# Patient Record
Sex: Male | Born: 1950 | Race: Black or African American | Hispanic: No | Marital: Married | Smoking: Former smoker
Health system: Southern US, Community
[De-identification: ages and names within clinical notes are randomized; demographics above are authoritative.]

## PROBLEM LIST (undated history)

## (undated) DIAGNOSIS — N419 Inflammatory disease of prostate, unspecified: Secondary | ICD-10-CM

## (undated) DIAGNOSIS — I10 Essential (primary) hypertension: Secondary | ICD-10-CM

## (undated) DIAGNOSIS — I639 Cerebral infarction, unspecified: Secondary | ICD-10-CM

## (undated) HISTORY — DX: Inflammatory disease of prostate, unspecified: N41.9

## (undated) HISTORY — DX: Essential (primary) hypertension: I10

## (undated) HISTORY — PX: OTHER SURGICAL HISTORY: SHX169

## (undated) HISTORY — PX: COLONOSCOPY: SHX174

## (undated) HISTORY — DX: Cerebral infarction, unspecified: I63.9

---

## 1998-05-20 ENCOUNTER — Emergency Department (HOSPITAL_COMMUNITY): Admission: EM | Admit: 1998-05-20 | Discharge: 1998-05-20 | Payer: Self-pay | Admitting: Emergency Medicine

## 2004-02-20 ENCOUNTER — Ambulatory Visit: Payer: Self-pay | Admitting: Internal Medicine

## 2004-02-27 ENCOUNTER — Ambulatory Visit: Payer: Self-pay | Admitting: Internal Medicine

## 2004-05-27 ENCOUNTER — Ambulatory Visit: Payer: Self-pay | Admitting: Internal Medicine

## 2004-05-28 ENCOUNTER — Ambulatory Visit: Payer: Self-pay | Admitting: Internal Medicine

## 2004-08-28 ENCOUNTER — Ambulatory Visit: Payer: Self-pay | Admitting: Internal Medicine

## 2004-08-29 ENCOUNTER — Ambulatory Visit: Payer: Self-pay | Admitting: Internal Medicine

## 2004-12-25 ENCOUNTER — Ambulatory Visit: Payer: Self-pay | Admitting: Internal Medicine

## 2005-01-01 ENCOUNTER — Ambulatory Visit: Payer: Self-pay | Admitting: Internal Medicine

## 2005-04-07 ENCOUNTER — Ambulatory Visit: Payer: Self-pay | Admitting: Internal Medicine

## 2005-08-01 ENCOUNTER — Ambulatory Visit: Payer: Self-pay | Admitting: Internal Medicine

## 2006-04-16 ENCOUNTER — Ambulatory Visit: Payer: Self-pay | Admitting: Internal Medicine

## 2006-04-16 LAB — CONVERTED CEMR LAB
AST: 21 units/L (ref 0–37)
Basophils Relative: 0.4 % (ref 0.0–1.0)
Bilirubin Urine: NEGATIVE
Calcium: 8.9 mg/dL (ref 8.4–10.5)
Chloride: 103 meq/L (ref 96–112)
Creatinine, Ser: 1 mg/dL (ref 0.4–1.5)
Eosinophils Relative: 3 % (ref 0.0–5.0)
GFR calc Af Amer: 99 mL/min
Glucose, Bld: 132 mg/dL — ABNORMAL HIGH (ref 70–99)
HCT: 42.7 % (ref 39.0–52.0)
LDL Cholesterol: 24 mg/dL (ref 0–99)
Lymphocytes Relative: 40.3 % (ref 12.0–46.0)
MCHC: 35.2 g/dL (ref 30.0–36.0)
MCV: 91.8 fL (ref 78.0–100.0)
Monocytes Absolute: 0.5 10*3/uL (ref 0.2–0.7)
Neutro Abs: 3.5 10*3/uL (ref 1.4–7.7)
Neutrophils Relative %: 49.7 % (ref 43.0–77.0)
Nitrite: NEGATIVE
RBC: 4.65 M/uL (ref 4.22–5.81)
Sodium: 138 meq/L (ref 135–145)
Specific Gravity, Urine: 1.02 (ref 1.000–1.03)
TSH: 2.01 microintl units/mL (ref 0.35–5.50)
Total CHOL/HDL Ratio: 2.6
Total Protein, Urine: NEGATIVE mg/dL
Triglycerides: 144 mg/dL (ref 0–149)
Urine Glucose: NEGATIVE mg/dL
Urobilinogen, UA: 0.2 (ref 0.0–1.0)
VLDL: 29 mg/dL (ref 0–40)
pH: 6 (ref 5.0–8.0)

## 2006-04-23 ENCOUNTER — Ambulatory Visit: Payer: Self-pay | Admitting: Internal Medicine

## 2006-10-21 ENCOUNTER — Ambulatory Visit: Payer: Self-pay | Admitting: Internal Medicine

## 2006-10-21 DIAGNOSIS — E1159 Type 2 diabetes mellitus with other circulatory complications: Secondary | ICD-10-CM

## 2006-10-22 ENCOUNTER — Ambulatory Visit: Payer: Self-pay | Admitting: Internal Medicine

## 2006-10-22 LAB — CONVERTED CEMR LAB
AST: 27 units/L (ref 0–37)
Albumin: 4 g/dL (ref 3.5–5.2)
Bilirubin, Direct: 0.2 mg/dL (ref 0.0–0.3)
Cholesterol: 95 mg/dL (ref 0–200)
GFR calc Af Amer: 99 mL/min
GFR calc non Af Amer: 82 mL/min
Glucose, Bld: 146 mg/dL — ABNORMAL HIGH (ref 70–99)
HDL: 29.5 mg/dL — ABNORMAL LOW (ref >39.0)
Hgb A1c MFr Bld: 6.6 % — ABNORMAL HIGH (ref 4.6–6.0)
LDL Cholesterol: 35 mg/dL (ref 0–99)
Potassium: 4 meq/L (ref 3.5–5.1)
Sodium: 139 meq/L (ref 135–145)
Total CHOL/HDL Ratio: 3.2
Triglycerides: 155 mg/dL — ABNORMAL HIGH (ref 0–149)
VLDL: 31 mg/dL (ref 0–40)

## 2007-04-28 ENCOUNTER — Encounter: Payer: Self-pay | Admitting: Internal Medicine

## 2007-05-11 ENCOUNTER — Encounter: Payer: Self-pay | Admitting: Internal Medicine

## 2007-05-18 ENCOUNTER — Ambulatory Visit: Payer: Self-pay | Admitting: Internal Medicine

## 2007-05-19 LAB — CONVERTED CEMR LAB
ALT: 24 units/L (ref 0–53)
Basophils Relative: 0.6 % (ref 0.0–1.0)
Bilirubin Urine: NEGATIVE
Bilirubin, Direct: 0.2 mg/dL (ref 0.0–0.3)
CO2: 28 meq/L (ref 19–32)
Cholesterol: 90 mg/dL (ref 0–200)
Crystals: NEGATIVE
Eosinophils Absolute: 0.3 10*3/uL (ref 0.0–0.6)
Eosinophils Relative: 4.1 % (ref 0.0–5.0)
GFR calc Af Amer: 99 mL/min
Glucose, Bld: 134 mg/dL — ABNORMAL HIGH (ref 70–99)
Hemoglobin: 14.9 g/dL (ref 13.0–17.0)
Leukocytes, UA: NEGATIVE
Lymphocytes Relative: 36.6 % (ref 12.0–46.0)
MCV: 91.3 fL (ref 78.0–100.0)
Monocytes Absolute: 0.4 10*3/uL (ref 0.2–0.7)
Monocytes Relative: 5.7 % (ref 3.0–11.0)
Neutro Abs: 4.2 10*3/uL (ref 1.4–7.7)
PSA: 0.28 ng/mL (ref 0.10–4.00)
Potassium: 3.8 meq/L (ref 3.5–5.1)
Sodium: 139 meq/L (ref 135–145)
Specific Gravity, Urine: 1.015 (ref 1.000–1.03)
TSH: 1.86 microintl units/mL (ref 0.35–5.50)
Total CHOL/HDL Ratio: 3.2
Total Protein, Urine: NEGATIVE mg/dL
Total Protein: 7.2 g/dL (ref 6.0–8.3)
Triglycerides: 152 mg/dL — ABNORMAL HIGH (ref 0–149)
Urine Glucose: NEGATIVE mg/dL
WBC: 7.7 10*3/uL (ref 4.5–10.5)

## 2007-06-22 ENCOUNTER — Ambulatory Visit: Payer: Self-pay | Admitting: Internal Medicine

## 2007-07-07 ENCOUNTER — Telehealth: Payer: Self-pay | Admitting: Internal Medicine

## 2007-07-07 ENCOUNTER — Encounter: Payer: Self-pay | Admitting: Internal Medicine

## 2008-02-02 ENCOUNTER — Ambulatory Visit: Payer: Self-pay | Admitting: Internal Medicine

## 2008-02-03 LAB — CONVERTED CEMR LAB
AST: 20 units/L (ref 0–37)
Albumin: 3.9 g/dL (ref 3.5–5.2)
Alkaline Phosphatase: 67 units/L (ref 39–117)
BUN: 10 mg/dL (ref 6–23)
CO2: 28 meq/L (ref 19–32)
Chloride: 106 meq/L (ref 96–112)
Cholesterol: 82 mg/dL (ref 0–200)
GFR calc non Af Amer: 106 mL/min
LDL Cholesterol: 27 mg/dL (ref 0–99)
Potassium: 3.8 meq/L (ref 3.5–5.1)

## 2008-02-09 ENCOUNTER — Ambulatory Visit: Payer: Self-pay | Admitting: Internal Medicine

## 2008-10-26 ENCOUNTER — Ambulatory Visit: Payer: Self-pay | Admitting: Internal Medicine

## 2008-10-30 LAB — CONVERTED CEMR LAB
AST: 22 units/L (ref 0–37)
Albumin: 4 g/dL (ref 3.5–5.2)
Alkaline Phosphatase: 54 units/L (ref 39–117)
BUN: 9 mg/dL (ref 6–23)
Basophils Relative: 0.2 % (ref 0.0–3.0)
Bilirubin Urine: NEGATIVE
CO2: 24 meq/L (ref 19–32)
Chloride: 105 meq/L (ref 96–112)
Cholesterol: 93 mg/dL (ref 0–200)
Creatinine, Ser: 0.9 mg/dL (ref 0.4–1.5)
Hemoglobin: 14.2 g/dL (ref 13.0–17.0)
Ketones, ur: NEGATIVE mg/dL
LDL Cholesterol: 32 mg/dL (ref 0–99)
Lymphocytes Relative: 39.5 % (ref 12.0–46.0)
Monocytes Relative: 6.5 % (ref 3.0–12.0)
Neutro Abs: 3.6 10*3/uL (ref 1.4–7.7)
Nitrite: NEGATIVE
RBC: 4.33 M/uL (ref 4.22–5.81)
Total CHOL/HDL Ratio: 3
Total Protein, Urine: NEGATIVE mg/dL
Total Protein: 6.8 g/dL (ref 6.0–8.3)
pH: 6 (ref 5.0–8.0)

## 2008-11-03 ENCOUNTER — Ambulatory Visit: Payer: Self-pay | Admitting: Internal Medicine

## 2009-03-01 ENCOUNTER — Ambulatory Visit: Payer: Self-pay | Admitting: Internal Medicine

## 2009-03-01 LAB — CONVERTED CEMR LAB
Bilirubin Urine: NEGATIVE
Chloride: 108 meq/L (ref 96–112)
Creatinine, Ser: 1.1 mg/dL (ref 0.4–1.5)
GFR calc non Af Amer: 88.13 mL/min (ref >60)
Hgb A1c MFr Bld: 7.4 % — ABNORMAL HIGH (ref 4.6–6.5)
Leukocytes, UA: NEGATIVE
Nitrite: NEGATIVE
Urobilinogen, UA: 0.2 (ref 0.0–1.0)
pH: 5.5 (ref 5.0–8.0)

## 2009-03-24 DIAGNOSIS — N419 Inflammatory disease of prostate, unspecified: Secondary | ICD-10-CM

## 2009-03-24 HISTORY — DX: Inflammatory disease of prostate, unspecified: N41.9

## 2009-10-26 ENCOUNTER — Ambulatory Visit: Payer: Self-pay | Admitting: Internal Medicine

## 2009-10-29 ENCOUNTER — Telehealth: Payer: Self-pay | Admitting: Internal Medicine

## 2009-10-29 LAB — CONVERTED CEMR LAB
AST: 25 units/L (ref 0–37)
Albumin: 4.2 g/dL (ref 3.5–5.2)
Alkaline Phosphatase: 57 units/L (ref 39–117)
BUN: 17 mg/dL (ref 6–23)
Basophils Relative: 0.6 % (ref 0.0–3.0)
Bilirubin, Direct: 0.1 mg/dL (ref 0.0–0.3)
CO2: 26 meq/L (ref 19–32)
Chloride: 105 meq/L (ref 96–112)
Direct LDL: 26.2 mg/dL
Glucose, Bld: 145 mg/dL — ABNORMAL HIGH (ref 70–99)
HDL: 27.1 mg/dL — ABNORMAL LOW (ref >39.00)
Hemoglobin: 14.8 g/dL (ref 13.0–17.0)
Lymphocytes Relative: 43.6 % (ref 12.0–46.0)
Monocytes Relative: 5.8 % (ref 3.0–12.0)
Neutro Abs: 4 10*3/uL (ref 1.4–7.7)
Neutrophils Relative %: 47.4 % (ref 43.0–77.0)
Nitrite: NEGATIVE
Potassium: 4 meq/L (ref 3.5–5.1)
RBC: 4.52 M/uL (ref 4.22–5.81)
Total CHOL/HDL Ratio: 4
Total Protein, Urine: NEGATIVE mg/dL
Total Protein: 6.9 g/dL (ref 6.0–8.3)
Urine Glucose: NEGATIVE mg/dL
VLDL: 109.6 mg/dL — ABNORMAL HIGH (ref 0.0–40.0)
WBC: 8.4 10*3/uL (ref 4.5–10.5)
pH: 6 (ref 5.0–8.0)

## 2009-11-05 ENCOUNTER — Ambulatory Visit: Payer: Self-pay | Admitting: Internal Medicine

## 2009-11-05 ENCOUNTER — Encounter: Payer: Self-pay | Admitting: Internal Medicine

## 2010-02-27 ENCOUNTER — Ambulatory Visit: Payer: Self-pay | Admitting: Internal Medicine

## 2010-04-23 NOTE — Assessment & Plan Note (Signed)
Summary: cpx-lb   Vital Signs:  Patient profile:   60 year old male Height:      70 inches Weight:      233 pounds BMI:     33.55 O2 Sat:      97 % on Room air Temp:     97.5 degrees F oral Pulse rate:   90 / minute Pulse rhythm:   regular Resp:     16 per minute BP sitting:   118 / 90  (left arm) Cuff size:   regular  Vitals Entered By: Lanier Prude, CMA(AAMA) (November 05, 2009 10:25 AM)  O2 Flow:  Room air CC: CPX Is Patient Diabetic? Yes   CC:  CPX.  History of Present Illness: The patient presents for a preventive health examination   Current Medications (verified): 1)  Viagra 100 Mg  Tabs (Sildenafil Citrate) .... Take 1 By Mouth Once Daily Prn 2)  Quinapril-Hydrochlorothiazide 20-12.5 Mg  Tabs (Quinapril-Hydrochlorothiazide) .... Take 2 By Mouth Once Daily 3)  Metformin Hcl 500 Mg  Tabs (Metformin Hcl) .Marland Kitchen.. 1 By Mouth Tid 4)  Lipitor 20 Mg  Tabs (Atorvastatin Calcium) .... Take As Directed 5)  Vitamin D3 1000 Unit  Tabs (Cholecalciferol) .Marland Kitchen.. 1 By Mouth Daily 6)  Zolpidem Tartrate 10 Mg  Tabs (Zolpidem Tartrate) .... 1/2 Hs As Needed 7)  Anusol-Hc 25 Mg Supp (Hydrocortisone Acetate) .Marland Kitchen.. 1 Pr Bid 8)  Cipro 500 Mg Tabs (Ciprofloxacin Hcl) .Marland Kitchen.. 1 Two Times A Day 9)  Probiotic  Caps (Probiotic Product) .Marland Kitchen.. 1 Once Daily  Allergies (verified): No Known Drug Allergies  Past History:  Family History: Last updated: 06/22/2007 Family History Hypertension  Social History: Last updated: 06/22/2007 Occupation: PE teacher Married Never Smoked Regular exercise-yes  Past Medical History: Diabetes mellitus, type II Hypertension Prostatitis 2011  Past Surgical History: Denies surgical history  Review of Systems  The patient denies anorexia, fever, weight loss, weight gain, vision loss, decreased hearing, hoarseness, chest pain, syncope, dyspnea on exertion, peripheral edema, prolonged cough, headaches, hemoptysis, abdominal pain, melena, hematochezia, severe  indigestion/heartburn, hematuria, incontinence, genital sores, muscle weakness, suspicious skin lesions, transient blindness, difficulty walking, depression, unusual weight change, abnormal bleeding, enlarged lymph nodes, angioedema, and testicular masses.    Physical Exam  General:  Well-developed,well-nourished,in no acute distress; alert,appropriate and cooperative throughout examination Head:  Normocephalic and atraumatic without obvious abnormalities. No apparent alopecia or balding. Eyes:  No corneal or conjunctival inflammation noted. EOMI. Perrla. Funduscopic exam benign, without hemorrhages, exudates or papilledema. Vision grossly normal. Ears:  wax in L ear Nose:  External nasal examination shows no deformity or inflammation. Nasal mucosa are pink and moist without lesions or exudates. Mouth:  Oral mucosa and oropharynx without lesions or exudates.  Teeth in good repair. Neck:  No deformities, masses, or tenderness noted. Lungs:  Normal respiratory effort, chest expands symmetrically. Lungs are clear to auscultation, no crackles or wheezes. Heart:  Normal rate and regular rhythm. S1 and S2 normal without gallop, murmur, click, rub or other extra sounds. Abdomen:  Bowel sounds positive,abdomen soft and non-tender without masses, organomegaly or hernias noted. Rectal:  No external abnormalities noted. Normal sphincter tone. No rectal masses or tenderness. Genitalia:  Testes bilaterally descended without nodularity, tenderness or masses. No scrotal masses or lesions. No penis lesions or urethral discharge. Prostate:  1+ enlarged.   Msk:  No deformity or scoliosis noted of thoracic or lumbar spine.   Pulses:  R and L carotid,radial,femoral,dorsalis pedis and posterior tibial pulses are full  and equal bilaterally Extremities:  No clubbing, cyanosis, edema, or deformity noted with normal full range of motion of all joints.   Neurologic:  No cranial nerve deficits noted. Station and gait are  normal. Plantar reflexes are down-going bilaterally. DTRs are symmetrical throughout. Sensory, motor and coordinative functions appear intact. Skin:  Intact without suspicious lesions or rashes Cervical Nodes:  No lymphadenopathy noted Inguinal Nodes:  No significant adenopathy Psych:  Cognition and judgment appear intact. Alert and cooperative with normal attention span and concentration. No apparent delusions, illusions, hallucinations   Impression & Recommendations:  Problem # 1:  Preventive Health Care (ICD-V70.0) Assessment New Health and age related issues were discussed. Available screening tests and vaccinations were discussed as well. Healthy life style including good diet and exercise was discussed.  The labs were reviewed with the patient.   Problem # 2:  PROSTATITIS, ACUTE (ICD-601.0) vs chronic Assessment: New  Orders: Radiology Referral (Radiology) abd [place]  Problem # 3:  HYPERTENSION (ICD-401.9) Assessment: Unchanged  His updated medication list for this problem includes:    Quinapril-hydrochlorothiazide 20-12.5 Mg Tabs (Quinapril-hydrochlorothiazide) .Marland Kitchen... Take 2 by mouth once daily  Problem # 4:  DIABETES MELLITUS, TYPE II (ICD-250.00) Assessment: Deteriorated  His updated medication list for this problem includes:    Quinapril-hydrochlorothiazide 20-12.5 Mg Tabs (Quinapril-hydrochlorothiazide) .Marland Kitchen... Take 2 by mouth once daily    Metformin Hcl 500 Mg Tabs (Metformin hcl) .Marland Kitchen... 1 by mouth tid  Labs Reviewed: Creat: 0.9 (10/26/2009)    Reviewed HgBA1c results: 7.4 (03/01/2009)  7.3 (10/26/2008)  Problem # 5:  HYPERLIPIDEMIA, MIXED (ICD-272.2) Assessment: Comment Only  His updated medication list for this problem includes:    Lipitor 20 Mg Tabs (Atorvastatin calcium) .Marland Kitchen... Take as directed  Labs Reviewed: SGOT: 25 (10/26/2009)   SGPT: 30 (10/26/2009)   HDL:27.10 (10/26/2009), 33.50 (10/26/2008)  LDL:32 (10/26/2008), 27 (02/02/2008)  Chol:117 (10/26/2009), 93  (10/26/2008)  Trig:548.0 Triglyceride is over 400; calculations on Lipids are invalid. mg/dL (16/12/9602), 540.9 (81/19/1478)  Complete Medication List: 1)  Viagra 100 Mg Tabs (Sildenafil citrate) .... Take 1 by mouth once daily prn 2)  Quinapril-hydrochlorothiazide 20-12.5 Mg Tabs (Quinapril-hydrochlorothiazide) .... Take 2 by mouth once daily 3)  Metformin Hcl 500 Mg Tabs (Metformin hcl) .Marland Kitchen.. 1 by mouth tid 4)  Lipitor 20 Mg Tabs (Atorvastatin calcium) .... Take as directed 5)  Vitamin D3 1000 Unit Tabs (Cholecalciferol) .Marland Kitchen.. 1 by mouth daily 6)  Zolpidem Tartrate 10 Mg Tabs (Zolpidem tartrate) .... 1/2 hs as needed 7)  Anusol-hc 25 Mg Supp (Hydrocortisone acetate) .Marland Kitchen.. 1 pr bid 8)  Cipro 500 Mg Tabs (Ciprofloxacin hcl) .Marland Kitchen.. 1 two times a day 9)  Probiotic Caps (Probiotic product) .Marland Kitchen.. 1 once daily  Other Orders: EKG w/ Interpretation (93000)  Patient Instructions: 1)  Please schedule a follow-up appointment in 4 months. 2)  BMP prior to visit, ICD-9: 3)  Lipid Panel prior to visit, ICD-9:272.20 995.20 4)  HbgA1C prior to visit, ICD-9: Prescriptions: LIPITOR 20 MG  TABS (ATORVASTATIN CALCIUM) take as directed  #30 x 12   Entered and Authorized by:   Tresa Garter MD   Signed by:   Tresa Garter MD on 11/05/2009   Method used:   Electronically to        [facility] [phone]* (retail)       [address]       [place], [place]  [address]       Ph: [phone] or [phone]  Fax: [phone]   RxID:   [id number] METFORMIN HCL 500 MG  TABS (METFORMIN HCL) 1 by mouth tid  #90 x 12   Entered and Authorized by:   Tresa Garter MD   Signed by:   Tresa Garter MD on 11/05/2009   Method used:   Electronically to        [facility] [phone]* (retail)       [address]       [place], [place]  [address]       Ph: [phone] or [phone]       Fax: [phone]   RxID:   [id number] QUINAPRIL-HYDROCHLOROTHIAZIDE 20-12.5 MG  TABS  (QUINAPRIL-HYDROCHLOROTHIAZIDE) TAKE 2 by mouth once daily  #60 x 12   Entered and Authorized by:   Tresa Garter MD   Signed by:   Tresa Garter MD on 11/05/2009   Method used:   Electronically to        [facility] [phone]* (retail)       [address]       [place], [place]  [address]       Ph: [phone] or [phone]       Fax: [phone]   RxID:   [id number] VIAGRA 100 MG  TABS (SILDENAFIL CITRATE) TAKE 1 by mouth once daily PRN  #12 x 12   Entered and Authorized by:   Tresa Garter MD   Signed by:   Tresa Garter MD on 11/05/2009   Method used:   Electronically to        [facility] [phone]* (retail)       [address]       [place], [place]  [address]       Ph: [phone] or [phone]       Fax: [phone]   RxID:   [id number]

## 2010-04-23 NOTE — Progress Notes (Signed)
  Phone Note Other Incoming   Summary of Call: see append from labs Initial call taken by: Lanier Prude, [facility]),  October 29, 2009 4:37 PM    New/Updated Medications: CIPRO 500 MG TABS (CIPROFLOXACIN HCL) 1 two times a day Prescriptions: CIPRO 500 MG TABS (CIPROFLOXACIN HCL) 1 two times a day  #28 x 0   Entered by:   Lanier Prude, CMA(AAMA)   Authorized by:   Tresa Garter MD   Signed by:   Lanier Prude, CMA(AAMA) on 10/29/2009   Method used:   Electronically to        [facility] [phone]* (retail)       [address]       [place], [place]  [address]       Ph: [phone] or [phone]       Fax: [phone]   RxID:   [id number]

## 2010-11-14 ENCOUNTER — Ambulatory Visit: Payer: Self-pay

## 2010-11-14 DIAGNOSIS — Z0389 Encounter for observation for other suspected diseases and conditions ruled out: Secondary | ICD-10-CM

## 2010-11-14 DIAGNOSIS — Z0000 Encounter for general adult medical examination without abnormal findings: Secondary | ICD-10-CM

## 2010-11-14 LAB — LIPID PANEL
HDL: 28.8 mg/dL — ABNORMAL LOW (ref >39.00)
Triglycerides: 260 mg/dL — ABNORMAL HIGH (ref 0.0–149.0)

## 2010-11-14 LAB — CBC WITH DIFFERENTIAL/PLATELET
Basophils Absolute: 0 10*3/uL (ref 0.0–0.1)
Eosinophils Absolute: 0.2 10*3/uL (ref 0.0–0.7)
HCT: 46 % (ref 39.0–52.0)
Lymphs Abs: 3.2 10*3/uL (ref 0.7–4.0)
MCV: 94.8 fl (ref 78.0–100.0)
Monocytes Absolute: 0.5 10*3/uL (ref 0.1–1.0)
Monocytes Relative: 5.7 % (ref 3.0–12.0)
Platelets: 322 10*3/uL (ref 150.0–400.0)
RDW: 13 % (ref 11.5–14.6)

## 2010-11-14 LAB — HEPATIC FUNCTION PANEL
Albumin: 4.4 g/dL (ref 3.5–5.2)
Alkaline Phosphatase: 66 U/L (ref 39–117)
Total Protein: 7.2 g/dL (ref 6.0–8.3)

## 2010-11-14 LAB — URINALYSIS
Bilirubin Urine: NEGATIVE
Ketones, ur: NEGATIVE
Leukocytes, UA: NEGATIVE
Urobilinogen, UA: 0.2 (ref 0.0–1.0)

## 2010-11-14 LAB — PSA: PSA: 0.32 ng/mL (ref 0.10–4.00)

## 2010-11-14 LAB — BASIC METABOLIC PANEL
BUN: 16 mg/dL (ref 6–23)
GFR: 124.73 mL/min (ref >60.00)
Glucose, Bld: 170 mg/dL — ABNORMAL HIGH (ref 70–99)
Potassium: 4.1 mEq/L (ref 3.5–5.1)

## 2010-11-14 LAB — TSH: TSH: 1.76 u[IU]/mL (ref 0.35–5.50)

## 2010-11-15 ENCOUNTER — Telehealth: Payer: Self-pay | Admitting: Internal Medicine

## 2010-11-15 NOTE — Telephone Encounter (Signed)
Christian Clements , please, inform the patient: elev sugar - improve diet   Please, keep  next office visit appointment.   Thank you !

## 2010-11-15 NOTE — Telephone Encounter (Signed)
Pts wife informed.

## 2010-11-19 ENCOUNTER — Other Ambulatory Visit: Payer: Self-pay | Admitting: Internal Medicine

## 2010-11-28 ENCOUNTER — Encounter: Payer: Self-pay | Admitting: Internal Medicine

## 2010-11-28 ENCOUNTER — Ambulatory Visit (INDEPENDENT_AMBULATORY_CARE_PROVIDER_SITE_OTHER): Payer: BC Managed Care – PPO | Admitting: Internal Medicine

## 2010-11-28 VITALS — BP 130/84 | HR 76 | Temp 98.4°F | Resp 16 | Wt 226.0 lb

## 2010-11-28 DIAGNOSIS — I10 Essential (primary) hypertension: Secondary | ICD-10-CM

## 2010-11-28 DIAGNOSIS — Z0000 Encounter for general adult medical examination without abnormal findings: Secondary | ICD-10-CM

## 2010-11-28 DIAGNOSIS — N529 Male erectile dysfunction, unspecified: Secondary | ICD-10-CM

## 2010-11-28 DIAGNOSIS — E119 Type 2 diabetes mellitus without complications: Secondary | ICD-10-CM

## 2010-11-28 MED ORDER — ATORVASTATIN CALCIUM 20 MG PO TABS: 20.0000 mg | ORAL_TABLET | Freq: Every day | ORAL | Status: DC

## 2010-11-28 MED ORDER — METFORMIN HCL 500 MG PO TABS: 500.0000 mg | ORAL_TABLET | Freq: Three times a day (TID) | ORAL | Status: DC

## 2010-11-28 MED ORDER — ZOLPIDEM TARTRATE 10 MG PO TABS: 5.0000 mg | ORAL_TABLET | Freq: Every evening | ORAL | Status: DC | PRN

## 2010-11-28 MED ORDER — HYDROCORTISONE ACETATE 25 MG RE SUPP: 25.0000 mg | Freq: Two times a day (BID) | RECTAL | Status: DC

## 2010-11-28 MED ORDER — SILDENAFIL CITRATE 100 MG PO TABS: 100.0000 mg | ORAL_TABLET | Freq: Every day | ORAL | Status: DC | PRN

## 2010-11-28 NOTE — Progress Notes (Signed)
Subjective:    Patient ID: Christian Clements, male    DOB: May 12, 1950, 60 y.o.   MRN: [id number]  HPI  The patient is here for a wellness exam. The patient has been doing well overall without major physical or psychological issues going on lately. The patient needs to address  chronic hypertension that has been well controlled with medicines; to address chronic  hyperlipidemia controlled with medicines as well; and to address type 2 chronic diabetes, controlled suboptimally with medical treatment and diet.   Review of Systems  Constitutional: Negative for appetite change, fatigue and unexpected weight change.  HENT: Negative for nosebleeds, congestion, sore throat, sneezing, trouble swallowing and neck pain.   Eyes: Negative for itching and visual disturbance.  Respiratory: Negative for cough.   Cardiovascular: Negative for chest pain, palpitations and leg swelling.  Gastrointestinal: Negative for nausea, diarrhea, blood in stool and abdominal distention.  Genitourinary: Negative for frequency and hematuria.  Musculoskeletal: Negative for back pain, joint swelling and gait problem.  Skin: Negative for rash.  Neurological: Negative for dizziness, tremors, speech difficulty and weakness.  Psychiatric/Behavioral: Negative for suicidal ideas, sleep disturbance, dysphoric mood and agitation. The patient is not nervous/anxious.        Objective:   Physical Exam  Constitutional: He is oriented to person, place, and time. He appears well-developed and well-nourished. No distress.  HENT:  Head: Normocephalic and atraumatic.  Right Ear: External ear normal.  Left Ear: External ear normal.  Nose: Nose normal.  Mouth/Throat: Oropharynx is clear and moist. No oropharyngeal exudate.  Eyes: Conjunctivae and EOM are normal. Pupils are equal, round, and reactive to light. Right eye exhibits no discharge. Left eye exhibits no discharge. No scleral icterus.  Neck: Normal range of motion. Neck supple.  No JVD present. No tracheal deviation present. No thyromegaly present.  Cardiovascular: Normal rate, regular rhythm, normal heart sounds and intact distal pulses.  Exam reveals no gallop and no friction rub.   No murmur heard. Pulmonary/Chest: Effort normal and breath sounds normal. No stridor. No respiratory distress. He has no wheezes. He has no rales. He exhibits no tenderness.  Abdominal: Soft. Bowel sounds are normal. He exhibits no distension and no mass. There is no tenderness. There is no rebound and no guarding.  Genitourinary: Rectum normal, prostate normal and penis normal. Guaiac negative stool. No penile tenderness.  Musculoskeletal: Normal range of motion. He exhibits no edema and no tenderness.  Lymphadenopathy:    He has no cervical adenopathy.  Neurological: He is alert and oriented to person, place, and time. He has normal reflexes. No cranial nerve deficit. He exhibits normal muscle tone. Coordination normal.  Skin: Skin is warm and dry. No rash noted. He is not diaphoretic. No erythema. No pallor.  Psychiatric: He has a normal mood and affect. His behavior is normal. Judgment and thought content normal.       Sad about aging    Lab Results  Component Value Date   WBC 8.6 11/14/2010   HGB 15.3 11/14/2010   HCT 46.0 11/14/2010   PLT 322.0 11/14/2010   CHOL 91 11/14/2010   TRIG 260.0* 11/14/2010   HDL 28.80* 11/14/2010   LDLDIRECT 30.1 11/14/2010   ALT 27 11/14/2010   AST 23 11/14/2010   NA 138 11/14/2010   K 4.1 11/14/2010   CL 104 11/14/2010   CREATININE 0.8 11/14/2010   BUN 16 11/14/2010   CO2 26 11/14/2010   TSH 1.76 11/14/2010   PSA 0.32 11/14/2010   HGBA1C  7.8* 11/14/2010         Assessment & Plan:

## 2010-11-28 NOTE — Assessment & Plan Note (Signed)
We discussed age appropriate health related issues, including available/recomended screening tests and vaccinations. We discussed a need for adhering to healthy diet and exercise. Labs/EKG were reviewed/ordered. All questions were answered.   

## 2010-11-28 NOTE — Assessment & Plan Note (Signed)
Continue with current prescription therapy as reflected on the Med list. On Viagra

## 2010-11-28 NOTE — Assessment & Plan Note (Signed)
Not well controlled Continue with current prescription therapy as reflected on the Med list.

## 2010-11-29 ENCOUNTER — Other Ambulatory Visit: Payer: Self-pay | Admitting: Internal Medicine

## 2010-12-01 NOTE — Assessment & Plan Note (Signed)
Continue with current prescription therapy as reflected on the Med list.  

## 2010-12-24 ENCOUNTER — Encounter: Payer: Self-pay | Admitting: Gastroenterology

## 2010-12-30 ENCOUNTER — Other Ambulatory Visit: Payer: Self-pay | Admitting: Internal Medicine

## 2011-03-31 ENCOUNTER — Ambulatory Visit: Payer: BC Managed Care – PPO | Admitting: Internal Medicine

## 2011-05-20 ENCOUNTER — Other Ambulatory Visit: Payer: Self-pay | Admitting: Internal Medicine

## 2011-06-15 ENCOUNTER — Other Ambulatory Visit: Payer: Self-pay | Admitting: Internal Medicine

## 2011-10-15 ENCOUNTER — Other Ambulatory Visit: Payer: Self-pay | Admitting: Internal Medicine

## 2011-10-23 ENCOUNTER — Other Ambulatory Visit (INDEPENDENT_AMBULATORY_CARE_PROVIDER_SITE_OTHER): Payer: BC Managed Care – PPO

## 2011-10-23 DIAGNOSIS — N529 Male erectile dysfunction, unspecified: Secondary | ICD-10-CM

## 2011-10-23 DIAGNOSIS — E119 Type 2 diabetes mellitus without complications: Secondary | ICD-10-CM

## 2011-10-23 LAB — COMPREHENSIVE METABOLIC PANEL
Alkaline Phosphatase: 54 U/L (ref 39–117)
CO2: 27 mEq/L (ref 19–32)
Creatinine, Ser: 0.9 mg/dL (ref 0.4–1.5)
GFR: 110.1 mL/min (ref >60.00)
Glucose, Bld: 125 mg/dL — ABNORMAL HIGH (ref 70–99)
Sodium: 139 mEq/L (ref 135–145)
Total Bilirubin: 0.7 mg/dL (ref 0.3–1.2)
Total Protein: 7.5 g/dL (ref 6.0–8.3)

## 2011-10-23 LAB — LIPID PANEL
Cholesterol: 99 mg/dL (ref 0–200)
VLDL: 34.2 mg/dL (ref 0.0–40.0)

## 2011-10-23 LAB — TESTOSTERONE: Testosterone: 342.2 ng/dL — ABNORMAL LOW (ref 350.00–890.00)

## 2011-10-27 ENCOUNTER — Ambulatory Visit (INDEPENDENT_AMBULATORY_CARE_PROVIDER_SITE_OTHER): Payer: BC Managed Care – PPO | Admitting: Internal Medicine

## 2011-10-27 ENCOUNTER — Encounter: Payer: Self-pay | Admitting: Internal Medicine

## 2011-10-27 VITALS — BP 125/85 | HR 84 | Temp 98.2°F | Resp 16 | Wt 228.0 lb

## 2011-10-27 DIAGNOSIS — N529 Male erectile dysfunction, unspecified: Secondary | ICD-10-CM

## 2011-10-27 DIAGNOSIS — E291 Testicular hypofunction: Secondary | ICD-10-CM

## 2011-10-27 DIAGNOSIS — I10 Essential (primary) hypertension: Secondary | ICD-10-CM

## 2011-10-27 DIAGNOSIS — Z0000 Encounter for general adult medical examination without abnormal findings: Secondary | ICD-10-CM

## 2011-10-27 DIAGNOSIS — E119 Type 2 diabetes mellitus without complications: Secondary | ICD-10-CM

## 2011-10-27 DIAGNOSIS — E782 Mixed hyperlipidemia: Secondary | ICD-10-CM

## 2011-10-27 MED ORDER — QUINAPRIL-HYDROCHLOROTHIAZIDE 20-12.5 MG PO TABS: 2.0000 | ORAL_TABLET | Freq: Every day | ORAL | Status: DC

## 2011-10-27 MED ORDER — ATORVASTATIN CALCIUM 20 MG PO TABS: 20.0000 mg | ORAL_TABLET | Freq: Every day | ORAL | Status: DC

## 2011-10-27 MED ORDER — METFORMIN HCL 500 MG PO TABS: 500.0000 mg | ORAL_TABLET | Freq: Three times a day (TID) | ORAL | Status: DC

## 2011-10-27 NOTE — Assessment & Plan Note (Signed)
Continue with current prescription therapy as reflected on the Med list.  

## 2011-10-27 NOTE — Assessment & Plan Note (Signed)
Elev BP today - rechecked nl

## 2011-10-27 NOTE — Progress Notes (Signed)
Subjective:    Patient ID: Christian Clements, male    DOB: 02/10/1951, 62 y.o.   MRN: [id number]  HPI   The patient needs to address  chronic hypertension that has been well controlled with medicines; to address chronic  hyperlipidemia controlled with medicines as well; and to address type 2 chronic diabetes, controlled suboptimally with medical treatment and diet.  Wt Readings from Last 3 Encounters:  10/27/11 228 lb (103.42 kg)  11/28/10 226 lb (102.513 kg)  11/05/09 233 lb (105.688 kg)   BP Readings from Last 3 Encounters:  10/27/11 170/98  11/28/10 130/84  11/05/09 118/90       Review of Systems  Constitutional: Negative for appetite change, fatigue and unexpected weight change.  HENT: Negative for nosebleeds, congestion, sore throat, sneezing, trouble swallowing and neck pain.   Eyes: Negative for itching and visual disturbance.  Respiratory: Negative for cough.   Cardiovascular: Negative for chest pain, palpitations and leg swelling.  Gastrointestinal: Negative for nausea, diarrhea, blood in stool and abdominal distention.  Genitourinary: Negative for frequency and hematuria.  Musculoskeletal: Negative for back pain, joint swelling and gait problem.  Skin: Negative for rash.  Neurological: Negative for dizziness, tremors, speech difficulty and weakness.  Psychiatric/Behavioral: Negative for suicidal ideas, disturbed wake/sleep cycle, dysphoric mood and agitation. The patient is not nervous/anxious.        Objective:   Physical Exam  Constitutional: He is oriented to person, place, and time. He appears well-developed and well-nourished. No distress.  HENT:  Head: Normocephalic and atraumatic.  Right Ear: External ear normal.  Left Ear: External ear normal.  Nose: Nose normal.  Mouth/Throat: Oropharynx is clear and moist. No oropharyngeal exudate.  Eyes: Conjunctivae and EOM are normal. Pupils are equal, round, and reactive to light. Right eye exhibits no discharge.  Left eye exhibits no discharge. No scleral icterus.  Neck: Normal range of motion. Neck supple. No JVD present. No tracheal deviation present. No thyromegaly present.  Cardiovascular: Normal rate, regular rhythm, normal heart sounds and intact distal pulses.  Exam reveals no gallop and no friction rub.   No murmur heard. Pulmonary/Chest: Effort normal and breath sounds normal. No stridor. No respiratory distress. He has no wheezes. He has no rales. He exhibits no tenderness.  Abdominal: Soft. Bowel sounds are normal. He exhibits no distension and no mass. There is no tenderness. There is no rebound and no guarding.  Genitourinary: Rectum normal, prostate normal and penis normal. Guaiac negative stool. No penile tenderness.  Musculoskeletal: Normal range of motion. He exhibits no edema and no tenderness.  Lymphadenopathy:    He has no cervical adenopathy.  Neurological: He is alert and oriented to person, place, and time. He has normal reflexes. No cranial nerve deficit. He exhibits normal muscle tone. Coordination normal.  Skin: Skin is warm and dry. No rash noted. He is not diaphoretic. No erythema. No pallor.  Psychiatric: He has a normal mood and affect. His behavior is normal. Judgment and thought content normal.       Sad about aging    Lab Results  Component Value Date   WBC 8.6 11/14/2010   HGB 15.3 11/14/2010   HCT 46.0 11/14/2010   PLT 322.0 11/14/2010   CHOL 99 10/23/2011   TRIG 171.0* 10/23/2011   HDL 36.10* 10/23/2011   LDLDIRECT 30.1 11/14/2010   ALT 25 10/23/2011   AST 20 10/23/2011   NA 139 10/23/2011   K 4.2 10/23/2011   CL 104 10/23/2011   CREATININE 0.9  10/23/2011   BUN 13 10/23/2011   CO2 27 10/23/2011   TSH 1.76 11/14/2010   PSA 0.32 11/14/2010   HGBA1C 7.1* 10/23/2011         Assessment & Plan:

## 2011-10-27 NOTE — Assessment & Plan Note (Signed)
Continue with current prescription therapy as reflected on the Med list. Better 

## 2011-10-27 NOTE — Assessment & Plan Note (Signed)
Discussed  Info given 

## 2011-10-30 ENCOUNTER — Telehealth: Payer: Self-pay

## 2011-10-30 MED ORDER — TESTOSTERONE MICRONIZED CRYS: CRYSTALS | Status: DC

## 2011-10-30 NOTE — Telephone Encounter (Signed)
Rx called into pharmacy, pt advised of same.  

## 2011-10-30 NOTE — Telephone Encounter (Signed)
Please advise - compound gel or Angrogel. Pt requested compound gel.

## 2011-10-30 NOTE — Telephone Encounter (Signed)
A compound - see rx pls Thx

## 2011-10-30 NOTE — Telephone Encounter (Signed)
Pt called requesting an Rx for Compound Testosterone gel which is less costly than Andgrogel, please advise.  If MD agrees to Rx Compound gel, please send Rx to [facility] on [address], Androgel [facility] go to [facility] [address]

## 2011-10-30 NOTE — Telephone Encounter (Signed)
Ok Testost gel 1%  1 ml on skin qd  #QS   x6 months Thx

## 2011-11-21 ENCOUNTER — Other Ambulatory Visit: Payer: Self-pay | Admitting: Internal Medicine

## 2011-12-16 ENCOUNTER — Encounter: Payer: Self-pay | Admitting: Gastroenterology

## 2012-02-26 ENCOUNTER — Other Ambulatory Visit: Payer: Self-pay | Admitting: Internal Medicine

## 2012-02-27 ENCOUNTER — Ambulatory Visit: Payer: BC Managed Care – PPO | Admitting: Internal Medicine

## 2012-03-04 ENCOUNTER — Other Ambulatory Visit: Payer: Self-pay | Admitting: Internal Medicine

## 2012-04-27 ENCOUNTER — Telehealth: Payer: Self-pay | Admitting: *Deleted

## 2012-04-27 NOTE — Telephone Encounter (Signed)
Rf req for Testosterone 1 % cream. Last filled 02/28/12. Ok to Rf?

## 2012-04-27 NOTE — Telephone Encounter (Signed)
OK to fill this prescription with additional refills x5 ROV Thank you!

## 2012-04-28 MED ORDER — TESTOSTERONE MICRONIZED CRYS: CRYSTALS | Status: DC

## 2012-04-28 NOTE — Telephone Encounter (Signed)
Done

## 2012-10-30 ENCOUNTER — Other Ambulatory Visit: Payer: Self-pay | Admitting: Internal Medicine

## 2012-11-02 ENCOUNTER — Other Ambulatory Visit: Payer: Self-pay | Admitting: *Deleted

## 2012-11-02 MED ORDER — ATORVASTATIN CALCIUM 20 MG PO TABS: 20.0000 mg | ORAL_TABLET | Freq: Every day | ORAL | Status: DC

## 2012-11-08 ENCOUNTER — Other Ambulatory Visit (INDEPENDENT_AMBULATORY_CARE_PROVIDER_SITE_OTHER): Payer: BC Managed Care – PPO

## 2012-11-08 DIAGNOSIS — E119 Type 2 diabetes mellitus without complications: Secondary | ICD-10-CM

## 2012-11-08 DIAGNOSIS — E291 Testicular hypofunction: Secondary | ICD-10-CM

## 2012-11-08 DIAGNOSIS — I10 Essential (primary) hypertension: Secondary | ICD-10-CM

## 2012-11-08 DIAGNOSIS — E782 Mixed hyperlipidemia: Secondary | ICD-10-CM

## 2012-11-08 DIAGNOSIS — Z0000 Encounter for general adult medical examination without abnormal findings: Secondary | ICD-10-CM

## 2012-11-08 DIAGNOSIS — N529 Male erectile dysfunction, unspecified: Secondary | ICD-10-CM

## 2012-11-08 LAB — URINALYSIS, ROUTINE W REFLEX MICROSCOPIC
Bilirubin Urine: NEGATIVE
Nitrite: NEGATIVE
Urobilinogen, UA: 1 (ref 0.0–1.0)

## 2012-11-08 LAB — TSH: TSH: 1.67 u[IU]/mL (ref 0.35–5.50)

## 2012-11-08 LAB — CBC WITH DIFFERENTIAL/PLATELET
Eosinophils Absolute: 0.2 10*3/uL (ref 0.0–0.7)
HCT: 44.1 % (ref 39.0–52.0)
Lymphs Abs: 3.3 10*3/uL (ref 0.7–4.0)
MCHC: 34.8 g/dL (ref 30.0–36.0)
MCV: 93.3 fl (ref 78.0–100.0)
Monocytes Absolute: 0.5 10*3/uL (ref 0.1–1.0)
Neutrophils Relative %: 51.5 % (ref 43.0–77.0)
Platelets: 293 10*3/uL (ref 150.0–400.0)
RDW: 12.9 % (ref 11.5–14.6)

## 2012-11-08 LAB — HEPATIC FUNCTION PANEL
ALT: 31 U/L (ref 0–53)
AST: 20 U/L (ref 0–37)
Alkaline Phosphatase: 50 U/L (ref 39–117)
Bilirubin, Direct: 0.1 mg/dL (ref 0.0–0.3)
Total Bilirubin: 0.7 mg/dL (ref 0.3–1.2)

## 2012-11-08 LAB — LIPID PANEL: HDL: 31.7 mg/dL — ABNORMAL LOW (ref >39.00)

## 2012-11-08 LAB — HEMOGLOBIN A1C: Hgb A1c MFr Bld: 9.3 % — ABNORMAL HIGH (ref 4.6–6.5)

## 2012-11-08 LAB — BASIC METABOLIC PANEL
Calcium: 9 mg/dL (ref 8.4–10.5)
Chloride: 104 mEq/L (ref 96–112)
Creatinine, Ser: 1 mg/dL (ref 0.4–1.5)
GFR: 99.46 mL/min (ref >60.00)

## 2012-11-08 LAB — TESTOSTERONE: Testosterone: 289.56 ng/dL — ABNORMAL LOW (ref 350.00–890.00)

## 2012-11-09 ENCOUNTER — Ambulatory Visit (INDEPENDENT_AMBULATORY_CARE_PROVIDER_SITE_OTHER): Payer: BC Managed Care – PPO | Admitting: Internal Medicine

## 2012-11-09 ENCOUNTER — Encounter: Payer: Self-pay | Admitting: Internal Medicine

## 2012-11-09 VITALS — BP 142/80 | HR 76 | Temp 98.2°F | Resp 16 | Wt 223.0 lb

## 2012-11-09 DIAGNOSIS — I10 Essential (primary) hypertension: Secondary | ICD-10-CM

## 2012-11-09 DIAGNOSIS — Z0000 Encounter for general adult medical examination without abnormal findings: Secondary | ICD-10-CM

## 2012-11-09 DIAGNOSIS — E291 Testicular hypofunction: Secondary | ICD-10-CM

## 2012-11-09 DIAGNOSIS — E119 Type 2 diabetes mellitus without complications: Secondary | ICD-10-CM

## 2012-11-09 DIAGNOSIS — E782 Mixed hyperlipidemia: Secondary | ICD-10-CM

## 2012-11-09 MED ORDER — QUINAPRIL-HYDROCHLOROTHIAZIDE 20-12.5 MG PO TABS: 2.0000 | ORAL_TABLET | Freq: Every day | ORAL | Status: DC

## 2012-11-09 MED ORDER — ALOGLIPTIN-PIOGLITAZONE 25-15 MG PO TABS: 1.0000 | ORAL_TABLET | Freq: Every day | ORAL | Status: DC

## 2012-11-09 MED ORDER — METFORMIN HCL 1000 MG PO TABS: 1000.0000 mg | ORAL_TABLET | Freq: Two times a day (BID) | ORAL | Status: DC

## 2012-11-09 MED ORDER — ATORVASTATIN CALCIUM 20 MG PO TABS: 20.0000 mg | ORAL_TABLET | Freq: Every day | ORAL | Status: DC

## 2012-11-09 NOTE — Assessment & Plan Note (Signed)
Other options to treat discussed: he is on micronized testost gel. Low test value.  Shots are another option

## 2012-11-09 NOTE — Assessment & Plan Note (Signed)
Continue with current prescription therapy as reflected on the Med list.  

## 2012-11-09 NOTE — Progress Notes (Signed)
Subjective:    HPI  Not seen in a year The patient needs to address  chronic hypertension that has been well controlled with medicines; to address chronic  hyperlipidemia controlled with medicines as well; and to address type 2 chronic diabetes, controlled suboptimally with medical treatment and diet. He just went on a better diet 2 wks ago  Wt Readings from Last 3 Encounters:  11/09/12 223 lb (101.152 kg)  10/27/11 228 lb (103.42 kg)  11/28/10 226 lb (102.513 kg)   BP Readings from Last 3 Encounters:  11/09/12 142/80  10/27/11 125/85  11/28/10 130/84       Review of Systems  Constitutional: Negative for appetite change, fatigue and unexpected weight change.  HENT: Negative for nosebleeds, congestion, sore throat, sneezing, trouble swallowing and neck pain.   Eyes: Negative for itching and visual disturbance.  Respiratory: Negative for cough.   Cardiovascular: Negative for chest pain, palpitations and leg swelling.  Gastrointestinal: Negative for nausea, diarrhea, blood in stool and abdominal distention.  Genitourinary: Negative for frequency and hematuria.  Musculoskeletal: Negative for back pain, joint swelling and gait problem.  Skin: Negative for rash.  Neurological: Negative for dizziness, tremors, speech difficulty and weakness.  Psychiatric/Behavioral: Negative for suicidal ideas, sleep disturbance, dysphoric mood and agitation. The patient is not nervous/anxious.   blurred vision     Objective:   Physical Exam  Constitutional: He is oriented to person, place, and time. He appears well-developed and well-nourished. No distress.  HENT:  Head: Normocephalic and atraumatic.  Right Ear: External ear normal.  Left Ear: External ear normal.  Nose: Nose normal.  Mouth/Throat: Oropharynx is clear and moist. No oropharyngeal exudate.  Eyes: Conjunctivae and EOM are normal. Pupils are equal, round, and reactive to light. Right eye exhibits no discharge. Left eye  exhibits no discharge. No scleral icterus.  Neck: Normal range of motion. Neck supple. No JVD present. No tracheal deviation present. No thyromegaly present.  Cardiovascular: Normal rate, regular rhythm, normal heart sounds and intact distal pulses.  Exam reveals no gallop and no friction rub.   No murmur heard. Pulmonary/Chest: Effort normal and breath sounds normal. No stridor. No respiratory distress. He has no wheezes. He has no rales. He exhibits no tenderness.  Abdominal: Soft. Bowel sounds are normal. He exhibits no distension and no mass. There is no tenderness. There is no rebound and no guarding.  Genitourinary: Rectum normal, prostate normal and penis normal. Guaiac negative stool. No penile tenderness.  Musculoskeletal: Normal range of motion. He exhibits no edema and no tenderness.  Lymphadenopathy:    He has no cervical adenopathy.  Neurological: He is alert and oriented to person, place, and time. He has normal reflexes. No cranial nerve deficit. He exhibits normal muscle tone. Coordination normal.  Skin: Skin is warm and dry. No rash noted. He is not diaphoretic. No erythema. No pallor.  Psychiatric: He has a normal mood and affect. His behavior is normal. Judgment and thought content normal.  Sad about aging    Lab Results  Component Value Date   WBC 8.4 11/08/2012   HGB 15.4 11/08/2012   HCT 44.1 11/08/2012   PLT 293.0 11/08/2012   CHOL 95 11/08/2012   TRIG 126.0 11/08/2012   HDL 31.70* 11/08/2012   LDLDIRECT 30.1 11/14/2010   ALT 31 11/08/2012   AST 20 11/08/2012   NA 136 11/08/2012   K 4.0 11/08/2012   CL 104 11/08/2012   CREATININE 1.0 11/08/2012   BUN 18 11/08/2012  CO2 24 11/08/2012   TSH 1.67 11/08/2012   PSA 0.32 11/14/2010   HGBA1C 9.3* 11/08/2012         Assessment & Plan:

## 2012-11-09 NOTE — Assessment & Plan Note (Addendum)
Worse See meds: start Oseni 25/15 qd and increase metformin to 1000 mg bid RTC 3 mo Ophth consult

## 2012-11-11 ENCOUNTER — Telehealth: Payer: Self-pay | Admitting: *Deleted

## 2012-11-11 NOTE — Telephone Encounter (Signed)
Message copied by Merrilyn Puma on Thu Nov 11, 2012  9:06 AM ------      Message from: Livingston Diones      Created: Wed Nov 03, 2012 10:59 AM       Called pt twice, can not leave vm. Will keep trying.       ----- Message -----         From: Merrilyn Puma, CMA         Sent: 11/02/2012  11:45 AM           To: Remigio Eisenmenger, #            Please call pt- last OV was 10/2011. I refilled his meds x 1 month. Thanks       ------

## 2012-11-13 ENCOUNTER — Other Ambulatory Visit: Payer: Self-pay | Admitting: Internal Medicine

## 2012-11-17 ENCOUNTER — Other Ambulatory Visit: Payer: Self-pay | Admitting: Internal Medicine

## 2013-01-01 ENCOUNTER — Other Ambulatory Visit: Payer: Self-pay | Admitting: Internal Medicine

## 2013-02-11 ENCOUNTER — Ambulatory Visit: Payer: BC Managed Care – PPO | Admitting: Internal Medicine

## 2013-02-11 DIAGNOSIS — Z0289 Encounter for other administrative examinations: Secondary | ICD-10-CM

## 2013-09-26 ENCOUNTER — Telehealth: Payer: Self-pay

## 2013-09-26 DIAGNOSIS — E119 Type 2 diabetes mellitus without complications: Secondary | ICD-10-CM

## 2013-09-26 NOTE — Telephone Encounter (Signed)
Diabetic bundle- a1c and bmet ordered Called and Lm on pt VM TCB and schedule follow up appointment

## 2013-10-02 ENCOUNTER — Other Ambulatory Visit: Payer: Self-pay | Admitting: Internal Medicine

## 2013-11-01 ENCOUNTER — Telehealth: Payer: Self-pay | Admitting: Internal Medicine

## 2013-11-01 NOTE — Telephone Encounter (Signed)
Left VM for patient to call back to schedule CPE.  Last CPE was 11/09/2012.

## 2013-12-16 ENCOUNTER — Other Ambulatory Visit (INDEPENDENT_AMBULATORY_CARE_PROVIDER_SITE_OTHER): Payer: BC Managed Care – PPO

## 2013-12-16 DIAGNOSIS — E119 Type 2 diabetes mellitus without complications: Secondary | ICD-10-CM

## 2013-12-16 LAB — BASIC METABOLIC PANEL
BUN: 15 mg/dL (ref 6–23)
CALCIUM: 8.7 mg/dL (ref 8.4–10.5)
CO2: 21 meq/L (ref 19–32)
CREATININE: 0.9 mg/dL (ref 0.4–1.5)
Chloride: 106 mEq/L (ref 96–112)
GFR: 107.95 mL/min (ref >60.00)
GLUCOSE: 185 mg/dL — AB (ref 70–99)
Potassium: 3.8 mEq/L (ref 3.5–5.1)
Sodium: 136 mEq/L (ref 135–145)

## 2013-12-16 LAB — HEMOGLOBIN A1C: Hgb A1c MFr Bld: 7.6 % — ABNORMAL HIGH (ref 4.6–6.5)

## 2013-12-28 ENCOUNTER — Ambulatory Visit (INDEPENDENT_AMBULATORY_CARE_PROVIDER_SITE_OTHER): Payer: BC Managed Care – PPO | Admitting: Internal Medicine

## 2013-12-28 ENCOUNTER — Encounter: Payer: Self-pay | Admitting: Internal Medicine

## 2013-12-28 VITALS — BP 130/88 | HR 91 | Temp 98.2°F | Resp 18 | Ht 70.0 in | Wt 225.0 lb

## 2013-12-28 DIAGNOSIS — R21 Rash and other nonspecific skin eruption: Secondary | ICD-10-CM

## 2013-12-28 DIAGNOSIS — I10 Essential (primary) hypertension: Secondary | ICD-10-CM

## 2013-12-28 DIAGNOSIS — Z0000 Encounter for general adult medical examination without abnormal findings: Secondary | ICD-10-CM

## 2013-12-28 MED ORDER — ALOGLIPTIN-PIOGLITAZONE 25-15 MG PO TABS: 1.0000 | ORAL_TABLET | Freq: Every day | ORAL | Status: DC

## 2013-12-28 MED ORDER — ATORVASTATIN CALCIUM 20 MG PO TABS: 20.0000 mg | ORAL_TABLET | Freq: Every day | ORAL | Status: DC

## 2013-12-28 MED ORDER — SILDENAFIL CITRATE 100 MG PO TABS: 100.0000 mg | ORAL_TABLET | Freq: Every day | ORAL | Status: AC | PRN

## 2013-12-28 MED ORDER — HYDROCORTISONE ACETATE 25 MG RE SUPP: 25.0000 mg | Freq: Two times a day (BID) | RECTAL | Status: DC

## 2013-12-28 MED ORDER — TRIAMCINOLONE ACETONIDE 0.5 % EX OINT: 1.0000 "application " | TOPICAL_OINTMENT | Freq: Two times a day (BID) | CUTANEOUS | Status: DC

## 2013-12-28 MED ORDER — METFORMIN HCL 1000 MG PO TABS: 1000.0000 mg | ORAL_TABLET | Freq: Two times a day (BID) | ORAL | Status: DC

## 2013-12-28 MED ORDER — ALOGLIPTIN-PIOGLITAZONE 25-30 MG PO TABS: ORAL_TABLET | ORAL | Status: DC

## 2013-12-28 MED ORDER — QUINAPRIL-HYDROCHLOROTHIAZIDE 20-12.5 MG PO TABS: 2.0000 | ORAL_TABLET | Freq: Every day | ORAL | Status: DC

## 2013-12-28 NOTE — Progress Notes (Signed)
Subjective:    HPI  The patient is here for a wellness exam. The patient has been doing well overall without major physical or psychological issues going on lately.    The patient needs to address  chronic hypertension that has been well controlled with medicines; to address chronic  hyperlipidemia controlled with medicines as well; and to address type 2 chronic diabetes, controlled suboptimally with medical treatment and diet. He just went on a better diet 2 wks ago  Wt Readings from Last 3 Encounters:  12/28/13 225 lb (102.059 kg)  11/09/12 223 lb (101.152 kg)  10/27/11 228 lb (103.42 kg)   BP Readings from Last 3 Encounters:  12/28/13 130/88  11/09/12 142/80  10/27/11 125/85     Review of Systems  Constitutional: Negative for appetite change, fatigue and unexpected weight change.  HENT: Negative for congestion, nosebleeds, sneezing, sore throat and trouble swallowing.   Eyes: Negative for itching and visual disturbance.  Respiratory: Negative for cough.   Cardiovascular: Negative for chest pain, palpitations and leg swelling.  Gastrointestinal: Negative for nausea, diarrhea, blood in stool and abdominal distention.  Genitourinary: Negative for frequency and hematuria.  Musculoskeletal: Negative for back pain, gait problem, joint swelling and neck pain.  Skin: Negative for rash.  Neurological: Negative for dizziness, tremors, speech difficulty and weakness.  Psychiatric/Behavioral: Negative for suicidal ideas, sleep disturbance, dysphoric mood and agitation. The patient is not nervous/anxious.        Objective:   Physical Exam  Constitutional: He is oriented to person, place, and time. He appears well-developed and well-nourished. No distress.  HENT:  Head: Normocephalic and atraumatic.  Right Ear: External ear normal.  Left Ear: External ear normal.  Nose: Nose normal.  Mouth/Throat: Oropharynx is clear and moist. No oropharyngeal exudate.  Eyes: Conjunctivae and  EOM are normal. Pupils are equal, round, and reactive to light. Right eye exhibits no discharge. Left eye exhibits no discharge. No scleral icterus.  Neck: Normal range of motion. Neck supple. No JVD present. No tracheal deviation present. No thyromegaly present.  Cardiovascular: Normal rate, regular rhythm, normal heart sounds and intact distal pulses.  Exam reveals no gallop and no friction rub.   No murmur heard. Pulmonary/Chest: Effort normal and breath sounds normal. No stridor. No respiratory distress. He has no wheezes. He has no rales. He exhibits no tenderness.  Abdominal: Soft. Bowel sounds are normal. He exhibits no distension and no mass. There is no tenderness. There is no rebound and no guarding.  Genitourinary: Rectum normal and penis normal. Guaiac negative stool. No penile tenderness.  Prostate 1+  Musculoskeletal: Normal range of motion. He exhibits no edema and no tenderness.  Lymphadenopathy:    He has no cervical adenopathy.  Neurological: He is alert and oriented to person, place, and time. He has normal reflexes. No cranial nerve deficit. He exhibits normal muscle tone. Coordination normal.  Skin: Skin is warm and dry. No rash noted. He is not diaphoretic. No erythema. No pallor.  Psychiatric: He has a normal mood and affect. His behavior is normal. Judgment and thought content normal.    Lab Results  Component Value Date   WBC 8.4 11/08/2012   HGB 15.4 11/08/2012   HCT 44.1 11/08/2012   PLT 293.0 11/08/2012   CHOL 95 11/08/2012   TRIG 126.0 11/08/2012   HDL 31.70* 11/08/2012   LDLDIRECT 30.1 11/14/2010   ALT 31 11/08/2012   AST 20 11/08/2012   NA 136 12/16/2013   K 3.8 12/16/2013  CL 106 12/16/2013   CREATININE 0.9 12/16/2013   BUN 15 12/16/2013   CO2 21 12/16/2013   TSH 1.67 11/08/2012   PSA 0.32 11/14/2010   HGBA1C 7.6* 12/16/2013         Assessment & Plan:

## 2013-12-28 NOTE — Assessment & Plan Note (Signed)
Continue with current prescription therapy as reflected on the Med list.  

## 2013-12-28 NOTE — Assessment & Plan Note (Signed)
We discussed age appropriate health related issues, including available/recomended screening tests and vaccinations. We discussed a need for adhering to healthy diet and exercise. Labs/EKG were reviewed/ordered. All questions were answered.   

## 2013-12-28 NOTE — Progress Notes (Signed)
Pre visit review using our clinic review tool, if applicable. No additional management support is needed unless otherwise documented below in the visit note. 

## 2013-12-28 NOTE — Assessment & Plan Note (Signed)
Continue with current prescription therapy as reflected on the Med list. Labs  

## 2013-12-29 ENCOUNTER — Telehealth: Payer: Self-pay | Admitting: Internal Medicine

## 2013-12-29 NOTE — Telephone Encounter (Signed)
emmi mailed  °

## 2013-12-31 ENCOUNTER — Encounter: Payer: Self-pay | Admitting: Internal Medicine

## 2014-02-01 ENCOUNTER — Other Ambulatory Visit: Payer: Self-pay | Admitting: Internal Medicine

## 2014-02-09 ENCOUNTER — Other Ambulatory Visit: Payer: Self-pay | Admitting: Internal Medicine

## 2014-04-03 ENCOUNTER — Ambulatory Visit: Payer: BC Managed Care – PPO | Admitting: Internal Medicine

## 2014-05-15 ENCOUNTER — Ambulatory Visit: Payer: BC Managed Care – PPO | Admitting: Internal Medicine

## 2015-02-10 ENCOUNTER — Other Ambulatory Visit: Payer: Self-pay | Admitting: Internal Medicine

## 2015-02-12 ENCOUNTER — Other Ambulatory Visit: Payer: Self-pay | Admitting: *Deleted

## 2015-02-12 ENCOUNTER — Other Ambulatory Visit (INDEPENDENT_AMBULATORY_CARE_PROVIDER_SITE_OTHER): Payer: BC Managed Care – PPO

## 2015-02-12 DIAGNOSIS — R21 Rash and other nonspecific skin eruption: Secondary | ICD-10-CM | POA: Diagnosis not present

## 2015-02-12 DIAGNOSIS — Z0000 Encounter for general adult medical examination without abnormal findings: Secondary | ICD-10-CM | POA: Diagnosis not present

## 2015-02-12 DIAGNOSIS — I10 Essential (primary) hypertension: Secondary | ICD-10-CM

## 2015-02-12 LAB — HEMOGLOBIN A1C: HEMOGLOBIN A1C: 7.7 % — AB (ref 4.6–6.5)

## 2015-02-12 MED ORDER — QUINAPRIL-HYDROCHLOROTHIAZIDE 20-12.5 MG PO TABS: 2.0000 | ORAL_TABLET | Freq: Every day | ORAL | Status: DC

## 2015-02-19 ENCOUNTER — Encounter: Payer: BC Managed Care – PPO | Admitting: Internal Medicine

## 2015-03-01 ENCOUNTER — Encounter: Payer: Self-pay | Admitting: Internal Medicine

## 2015-03-01 ENCOUNTER — Ambulatory Visit (INDEPENDENT_AMBULATORY_CARE_PROVIDER_SITE_OTHER): Payer: Medicare Other | Admitting: Internal Medicine

## 2015-03-01 VITALS — BP 139/84 | HR 80 | Ht 70.0 in | Wt 222.0 lb

## 2015-03-01 DIAGNOSIS — E782 Mixed hyperlipidemia: Secondary | ICD-10-CM | POA: Diagnosis not present

## 2015-03-01 DIAGNOSIS — Z0000 Encounter for general adult medical examination without abnormal findings: Secondary | ICD-10-CM | POA: Diagnosis not present

## 2015-03-01 DIAGNOSIS — E1159 Type 2 diabetes mellitus with other circulatory complications: Secondary | ICD-10-CM | POA: Diagnosis not present

## 2015-03-01 DIAGNOSIS — I10 Essential (primary) hypertension: Secondary | ICD-10-CM

## 2015-03-01 MED ORDER — METFORMIN HCL 1000 MG PO TABS: 1000.0000 mg | ORAL_TABLET | Freq: Two times a day (BID) | ORAL | Status: DC

## 2015-03-01 MED ORDER — TRIAMCINOLONE ACETONIDE 0.5 % EX OINT: 1.0000 "application " | TOPICAL_OINTMENT | Freq: Two times a day (BID) | CUTANEOUS | Status: DC

## 2015-03-01 MED ORDER — ALOGLIPTIN-PIOGLITAZONE 25-30 MG PO TABS: ORAL_TABLET | ORAL | Status: DC

## 2015-03-01 MED ORDER — ATORVASTATIN CALCIUM 20 MG PO TABS: 20.0000 mg | ORAL_TABLET | Freq: Every day | ORAL | Status: DC

## 2015-03-01 MED ORDER — QUINAPRIL-HYDROCHLOROTHIAZIDE 20-12.5 MG PO TABS: 2.0000 | ORAL_TABLET | Freq: Every day | ORAL | Status: DC

## 2015-03-01 NOTE — Patient Instructions (Signed)

## 2015-03-01 NOTE — Assessment & Plan Note (Addendum)
Here for medicare wellness/physical  Diet: heart healthy and ADA Physical activity: not sedentary  Depression/mood screen: negative  Hearing: intact to whispered voice  Visual acuity: grossly normal, performs annual eye exam  ADLs: capable  Fall risk: none  Home safety: good  Cognitive evaluation: intact to orientation, naming, recall and repetition  EOL planning: adv directives, full code/ I agree  I have personally reviewed and have noted  1. The patient's medical, surgical and social history  2. Their use of alcohol, tobacco or illicit drugs  3. Their current medications and supplements  4. The patient's functional ability including ADL's, fall risks, home safety risks and hearing or visual impairment.  5. Diet and physical activities  6. Evidence for depression or mood disorders 7. The roster of all physicians providing medical care to patient - is listed in the Snapshot section of the chart and reviewed today.    Today patient counseled on age appropriate routine health concerns for screening and prevention, each reviewed and up to date or declined. Immunizations reviewed and up to date or declined. Labs ordered and reviewed. Risk factors for depression reviewed and negative. Hearing function and visual acuity are intact. ADLs screened and addressed as needed. Functional ability and level of safety reviewed and appropriate. Education, counseling and referrals performed based on assessed risks today. Patient provided with a copy of personalized plan for preventive services.   DT suggested Colonoscopy vs Cologuard advised Labs

## 2015-03-01 NOTE — Assessment & Plan Note (Signed)
Chronic Accuretic Labs

## 2015-03-01 NOTE — Assessment & Plan Note (Signed)
Chronic Actos, Metformin Not well controlled - Endo consult

## 2015-03-01 NOTE — Assessment & Plan Note (Signed)
On Lipitor Labs 

## 2015-03-01 NOTE — Progress Notes (Signed)
Pre visit review using our clinic review tool, if applicable. No additional management support is needed unless otherwise documented below in the visit note. 

## 2015-03-01 NOTE — Progress Notes (Signed)
Subjective:  Patient ID: Christian Clements, male    DOB: Jul 16, 1950  Age: 64 y.o. MRN: [id number]  CC: No chief complaint on file.   HPI Christian Clements presents for a well exam. F/u DM, HTN  Outpatient Prescriptions Prior to Visit  Medication Sig Dispense Refill  . Alogliptin-Pioglitazone 25-30 MG TABS 1 po qd 30 tablet 11  . atorvastatin (LIPITOR) 20 MG tablet Take 1 tablet (20 mg total) by mouth daily. 90 tablet 3  . Cholecalciferol 1000 UNITS tablet Take 1,000 Units by mouth daily.      . hydrocortisone (ANUSOL-HC) 25 MG suppository Place 1 suppository (25 mg total) rectally 2 (two) times daily. 20 suppository 3  . metFORMIN (GLUCOPHAGE) 1000 MG tablet Take 1 tablet (1,000 mg total) by mouth 2 (two) times daily with a meal. 180 tablet 3  . PROBIOTIC CAPS Take 1 capsule by mouth daily.      . quinapril-hydrochlorothiazide (ACCURETIC) 20-12.5 MG tablet Take 2 tablets by mouth daily. 180 tablet 2  . sildenafil (VIAGRA) 100 MG tablet Take 1 tablet (100 mg total) by mouth daily as needed. 10 tablet 11  . Testosterone Micronized CRYS Apply 66ml and rub in once a day. 60 Bottle 5  . triamcinolone ointment (KENALOG) 0.5 % Apply 1 application topically 2 (two) times daily. 30 g 1  . zolpidem (AMBIEN) 10 MG tablet Take 0.5 tablets (5 mg total) by mouth at bedtime as needed. 30 tablet 5   No facility-administered medications prior to visit.    ROS Review of Systems  Constitutional: Negative for appetite change, fatigue and unexpected weight change.  HENT: Negative for congestion, nosebleeds, sneezing, sore throat and trouble swallowing.   Eyes: Negative for itching and visual disturbance.  Respiratory: Negative for cough.   Cardiovascular: Negative for chest pain, palpitations and leg swelling.  Gastrointestinal: Negative for nausea, diarrhea, blood in stool and abdominal distention.  Genitourinary: Negative for frequency and hematuria.  Musculoskeletal: Negative for back pain, joint  swelling, gait problem and neck pain.  Skin: Negative for rash.  Neurological: Negative for dizziness, tremors, speech difficulty and weakness.  Psychiatric/Behavioral: Negative for suicidal ideas, sleep disturbance, dysphoric mood and agitation. The patient is not nervous/anxious.     Objective:  BP 139/84 mmHg  Pulse 80  Ht 5\' 10"  (1.778 m)  Wt 222 lb (100.699 kg)  BMI 31.85 kg/m2  SpO2 97%  BP Readings from Last 3 Encounters:  03/01/15 139/84  12/28/13 130/88  11/09/12 142/80    Wt Readings from Last 3 Encounters:  03/01/15 222 lb (100.699 kg)  12/28/13 225 lb (102.059 kg)  11/09/12 223 lb (101.152 kg)    Physical Exam  Constitutional: He is oriented to person, place, and time. He appears well-developed and well-nourished. No distress.  HENT:  Head: Normocephalic and atraumatic.  Right Ear: External ear normal.  Left Ear: External ear normal.  Nose: Nose normal.  Mouth/Throat: Oropharynx is clear and moist. No oropharyngeal exudate.  Eyes: Conjunctivae and EOM are normal. Pupils are equal, round, and reactive to light. Right eye exhibits no discharge. Left eye exhibits no discharge. No scleral icterus.  Neck: Normal range of motion. Neck supple. No JVD present. No tracheal deviation present. No thyromegaly present.  Cardiovascular: Normal rate, regular rhythm, normal heart sounds and intact distal pulses.  Exam reveals no gallop and no friction rub.   No murmur heard. Pulmonary/Chest: Effort normal and breath sounds normal. No stridor. No respiratory distress. He has no wheezes. He has no rales.  He exhibits no tenderness.  Abdominal: Soft. Bowel sounds are normal. He exhibits no distension and no mass. There is no tenderness. There is no rebound and no guarding.  Genitourinary: Rectum normal and penis normal. Guaiac negative stool. No penile tenderness.  Musculoskeletal: Normal range of motion. He exhibits no edema or tenderness.  Lymphadenopathy:    He has no cervical  adenopathy.  Neurological: He is alert and oriented to person, place, and time. He has normal reflexes. No cranial nerve deficit. He exhibits normal muscle tone. Coordination normal.  Skin: Skin is warm and dry. No rash noted. He is not diaphoretic. No erythema. No pallor.  Psychiatric: He has a normal mood and affect. His behavior is normal. Judgment and thought content normal.  prostate 1+  Lab Results  Component Value Date   WBC 8.4 11/08/2012   HGB 15.4 11/08/2012   HCT 44.1 11/08/2012   PLT 293.0 11/08/2012   GLUCOSE 185* 12/16/2013   CHOL 95 11/08/2012   TRIG 126.0 11/08/2012   HDL 31.70* 11/08/2012   LDLDIRECT 30.1 11/14/2010   LDLCALC 38 11/08/2012   ALT 31 11/08/2012   AST 20 11/08/2012   NA 136 12/16/2013   K 3.8 12/16/2013   CL 106 12/16/2013   CREATININE 0.9 12/16/2013   BUN 15 12/16/2013   CO2 21 12/16/2013   TSH 1.67 11/08/2012   PSA 0.32 11/14/2010   HGBA1C 7.7* 02/12/2015    No results found.  Assessment & Plan:   Diagnoses and all orders for this visit:  Well adult exam  Type 2 diabetes mellitus with vascular disease ([place])  Essential hypertension  HYPERLIPIDEMIA, MIXED  Other orders -     Alogliptin-Pioglitazone 25-30 MG TABS; 1 po qd -     atorvastatin (LIPITOR) 20 MG tablet; Take 1 tablet (20 mg total) by mouth daily. -     metFORMIN (GLUCOPHAGE) 1000 MG tablet; Take 1 tablet (1,000 mg total) by mouth 2 (two) times daily with a meal. -     quinapril-hydrochlorothiazide (ACCURETIC) 20-12.5 MG tablet; Take 2 tablets by mouth daily. -     triamcinolone ointment (KENALOG) 0.5 %; Apply 1 application topically 2 (two) times daily.  I am having Mr. Nabong maintain his Probiotic, Cholecalciferol, zolpidem, Testosterone Micronized, atorvastatin, hydrocortisone, metFORMIN, sildenafil, Alogliptin-Pioglitazone, triamcinolone ointment, and quinapril-hydrochlorothiazide.  No orders of the defined types were placed in this encounter.     Follow-up: No  Follow-up on file.  Walker Kehr, MD

## 2015-04-03 ENCOUNTER — Encounter: Payer: Self-pay | Admitting: Endocrinology

## 2015-08-30 ENCOUNTER — Ambulatory Visit: Payer: Medicare Other | Admitting: Internal Medicine

## 2016-01-04 ENCOUNTER — Emergency Department (HOSPITAL_COMMUNITY): Payer: Medicare Other

## 2016-01-04 ENCOUNTER — Observation Stay (HOSPITAL_COMMUNITY): Payer: Medicare Other

## 2016-01-04 ENCOUNTER — Encounter (HOSPITAL_COMMUNITY): Payer: Self-pay

## 2016-01-04 ENCOUNTER — Inpatient Hospital Stay (HOSPITAL_COMMUNITY)
Admission: EM | Admit: 2016-01-04 | Discharge: 2016-01-06 | DRG: 065 | Disposition: A | Discharge status: Home / Self Care | Payer: Medicare Other | Attending: Internal Medicine | Admitting: Internal Medicine

## 2016-01-04 DIAGNOSIS — I639 Cerebral infarction, unspecified: Principal | ICD-10-CM | POA: Diagnosis present

## 2016-01-04 DIAGNOSIS — E785 Hyperlipidemia, unspecified: Secondary | ICD-10-CM | POA: Diagnosis present

## 2016-01-04 DIAGNOSIS — E782 Mixed hyperlipidemia: Secondary | ICD-10-CM | POA: Diagnosis not present

## 2016-01-04 DIAGNOSIS — Z87891 Personal history of nicotine dependence: Secondary | ICD-10-CM

## 2016-01-04 DIAGNOSIS — E1159 Type 2 diabetes mellitus with other circulatory complications: Secondary | ICD-10-CM | POA: Diagnosis present

## 2016-01-04 DIAGNOSIS — I69359 Hemiplegia and hemiparesis following cerebral infarction affecting unspecified side: Secondary | ICD-10-CM | POA: Diagnosis present

## 2016-01-04 DIAGNOSIS — Z7901 Long term (current) use of anticoagulants: Secondary | ICD-10-CM

## 2016-01-04 DIAGNOSIS — Z8249 Family history of ischemic heart disease and other diseases of the circulatory system: Secondary | ICD-10-CM

## 2016-01-04 DIAGNOSIS — R2681 Unsteadiness on feet: Secondary | ICD-10-CM

## 2016-01-04 DIAGNOSIS — E876 Hypokalemia: Secondary | ICD-10-CM | POA: Diagnosis present

## 2016-01-04 DIAGNOSIS — G8191 Hemiplegia, unspecified affecting right dominant side: Secondary | ICD-10-CM

## 2016-01-04 DIAGNOSIS — I10 Essential (primary) hypertension: Secondary | ICD-10-CM | POA: Diagnosis present

## 2016-01-04 DIAGNOSIS — E119 Type 2 diabetes mellitus without complications: Secondary | ICD-10-CM | POA: Diagnosis present

## 2016-01-04 DIAGNOSIS — R001 Bradycardia, unspecified: Secondary | ICD-10-CM | POA: Diagnosis present

## 2016-01-04 DIAGNOSIS — E1169 Type 2 diabetes mellitus with other specified complication: Secondary | ICD-10-CM | POA: Diagnosis present

## 2016-01-04 LAB — CBC
HEMATOCRIT: 44.3 % (ref 39.0–52.0)
Hemoglobin: 15.8 g/dL (ref 13.0–17.0)
MCH: 31.6 pg (ref 26.0–34.0)
MCHC: 35.7 g/dL (ref 30.0–36.0)
MCV: 88.6 fL (ref 78.0–100.0)
Platelets: 256 10*3/uL (ref 150–400)
RBC: 5 MIL/uL (ref 4.22–5.81)
RDW: 12.5 % (ref 11.5–15.5)
WBC: 9.3 10*3/uL (ref 4.0–10.5)

## 2016-01-04 LAB — I-STAT CHEM 8, ED
BUN: 13 mg/dL (ref 6–20)
CALCIUM ION: 1.08 mmol/L — AB (ref 1.15–1.40)
Chloride: 102 mmol/L (ref 101–111)
Creatinine, Ser: 0.9 mg/dL (ref 0.61–1.24)
Glucose, Bld: 179 mg/dL — ABNORMAL HIGH (ref 65–99)
HEMATOCRIT: 46 % (ref 39.0–52.0)
HEMOGLOBIN: 15.6 g/dL (ref 13.0–17.0)
Potassium: 3.8 mmol/L (ref 3.5–5.1)
SODIUM: 138 mmol/L (ref 135–145)
TCO2: 26 mmol/L (ref 0–100)

## 2016-01-04 LAB — COMPREHENSIVE METABOLIC PANEL
ALT: 26 U/L (ref 17–63)
ANION GAP: 13 (ref 5–15)
AST: 21 U/L (ref 15–41)
Albumin: 4.6 g/dL (ref 3.5–5.0)
Alkaline Phosphatase: 61 U/L (ref 38–126)
BUN: 9 mg/dL (ref 6–20)
CHLORIDE: 105 mmol/L (ref 101–111)
CO2: 20 mmol/L — ABNORMAL LOW (ref 22–32)
Calcium: 9.8 mg/dL (ref 8.9–10.3)
Creatinine, Ser: 1.04 mg/dL (ref 0.61–1.24)
Glucose, Bld: 182 mg/dL — ABNORMAL HIGH (ref 65–99)
POTASSIUM: 3.5 mmol/L (ref 3.5–5.1)
Sodium: 138 mmol/L (ref 135–145)
TOTAL PROTEIN: 7.7 g/dL (ref 6.5–8.1)
Total Bilirubin: 0.8 mg/dL (ref 0.3–1.2)

## 2016-01-04 LAB — GLUCOSE, CAPILLARY
GLUCOSE-CAPILLARY: 251 mg/dL — AB (ref 65–99)
GLUCOSE-CAPILLARY: 154 mg/dL — AB (ref 65–99)

## 2016-01-04 LAB — DIFFERENTIAL
BASOS ABS: 0 10*3/uL (ref 0.0–0.1)
BASOS PCT: 0 %
EOS ABS: 0.1 10*3/uL (ref 0.0–0.7)
EOS PCT: 1 %
Lymphocytes Relative: 35 %
Lymphs Abs: 3.3 10*3/uL (ref 0.7–4.0)
MONO ABS: 0.5 10*3/uL (ref 0.1–1.0)
MONOS PCT: 5 %
Neutro Abs: 5.5 10*3/uL (ref 1.7–7.7)
Neutrophils Relative %: 59 %

## 2016-01-04 LAB — I-STAT TROPONIN, ED: TROPONIN I, POC: 0.03 ng/mL (ref 0.00–0.08)

## 2016-01-04 LAB — PROTIME-INR
INR: 1.04
Prothrombin Time: 13.7 seconds (ref 11.4–15.2)

## 2016-01-04 LAB — APTT: APTT: 27 s (ref 24–36)

## 2016-01-04 LAB — CBG MONITORING, ED: Glucose-Capillary: 189 mg/dL — ABNORMAL HIGH (ref 65–99)

## 2016-01-04 MED ORDER — ENOXAPARIN SODIUM 40 MG/0.4ML ~~LOC~~ SOLN
40.0000 mg | SUBCUTANEOUS | Status: DC
  Administered 2016-01-04 – 2016-01-05 (×2): 40 mg via SUBCUTANEOUS
  Filled 2016-01-04 (×2): qty 0.4

## 2016-01-04 MED ORDER — STROKE: EARLY STAGES OF RECOVERY BOOK
Freq: Once | Status: AC
  Administered 2016-01-04: 18:00:00
  Filled 2016-01-04: qty 1

## 2016-01-04 MED ORDER — ATORVASTATIN CALCIUM 40 MG PO TABS
40.0000 mg | ORAL_TABLET | Freq: Every day | ORAL | Status: DC
  Administered 2016-01-04: 40 mg via ORAL
  Filled 2016-01-04: qty 1

## 2016-01-04 MED ORDER — ASPIRIN 300 MG RE SUPP: 300.0000 mg | Freq: Every day | RECTAL | Status: DC

## 2016-01-04 MED ORDER — SENNOSIDES-DOCUSATE SODIUM 8.6-50 MG PO TABS: 1.0000 | ORAL_TABLET | Freq: Every evening | ORAL | Status: DC | PRN

## 2016-01-04 MED ORDER — ASPIRIN 325 MG PO TABS
325.0000 mg | ORAL_TABLET | Freq: Every day | ORAL | Status: DC
  Administered 2016-01-04 – 2016-01-06 (×3): 325 mg via ORAL
  Filled 2016-01-04 (×3): qty 1

## 2016-01-04 MED ORDER — ACETAMINOPHEN 650 MG RE SUPP: 650.0000 mg | RECTAL | Status: DC | PRN

## 2016-01-04 MED ORDER — ACETAMINOPHEN 325 MG PO TABS: 650.0000 mg | ORAL_TABLET | ORAL | Status: DC | PRN

## 2016-01-04 MED ORDER — INSULIN ASPART 100 UNIT/ML ~~LOC~~ SOLN: 0.0000 [IU] | Freq: Four times a day (QID) | SUBCUTANEOUS | Status: DC

## 2016-01-04 MED ORDER — ZOLPIDEM TARTRATE 5 MG PO TABS: 5.0000 mg | ORAL_TABLET | Freq: Every evening | ORAL | Status: DC | PRN

## 2016-01-04 NOTE — ED Triage Notes (Addendum)
Pt. Woke up with rt. Arm numbness and went to the gym and while at the gyum rt. Leg became numb and his balance is off.  Pt. Reports that he developed blurred vision yesterday.  Pt. Denies any pain.  Speech is clear,  Mild rt. Facial droop noted.  Rt. Hand grasp is weaker than left. No drift noted.   Pt. Is alert and oriented X 4

## 2016-01-04 NOTE — ED Provider Notes (Signed)
Christian Clements   CSN: [id number] Arrival date & time: 01/04/16  1241     History   Chief Complaint Chief Complaint  Patient presents with  . Numbness    HPI Christian Clements is a 65 y.o. male.complaining of right sided weakness began Wednesday in am after waking up.  Patient with difficulty holding fork and walking.  No headache.  Saw MD at gym and told to come to ed.  No prior history of same.   HPI  Past Medical History:  Diagnosis Date  . Diabetes mellitus   . Hypertension   . Prostatitis 2011    Patient Active Problem List   Diagnosis Date Noted  . Rash and nonspecific skin eruption 12/28/2013  . Hypogonadism male 10/27/2011  . Well adult exam 11/28/2010  . UTI 11/05/2009  . PROSTATITIS, ACUTE 11/05/2009  . ERECTILE DYSFUNCTION 02/09/2008  . Type 2 diabetes mellitus with vascular disease ([place]) 10/21/2006  . HYPERLIPIDEMIA, MIXED 10/21/2006  . Essential hypertension 10/21/2006    History reviewed. No pertinent surgical history.     Home Medications    Prior to Admission medications   Medication Sig Start Date End Date Taking? Authorizing Provider  Alogliptin-Pioglitazone 25-30 MG TABS 1 po qd 03/01/15   Cassandria Anger, MD  atorvastatin (LIPITOR) 20 MG tablet Take 1 tablet (20 mg total) by mouth daily. 03/01/15   Cassandria Anger, MD  Cholecalciferol 1000 UNITS tablet Take 1,000 Units by mouth daily.      Historical Provider, MD  hydrocortisone (ANUSOL-HC) 25 MG suppository Place 1 suppository (25 mg total) rectally 2 (two) times daily. 12/28/13   Evie Lacks Plotnikov, MD  metFORMIN (GLUCOPHAGE) 1000 MG tablet Take 1 tablet (1,000 mg total) by mouth 2 (two) times daily with a meal. 03/01/15   Evie Lacks Plotnikov, MD  PROBIOTIC CAPS Take 1 capsule by mouth daily.      Historical Provider, MD  quinapril-hydrochlorothiazide (ACCURETIC) 20-12.5 MG tablet Take 2 tablets by mouth daily. 03/01/15   Evie Lacks Plotnikov, MD  sildenafil (VIAGRA) 100  MG tablet Take 1 tablet (100 mg total) by mouth daily as needed. 12/28/13   Cassandria Anger, MD  Testosterone Micronized CRYS Apply 65ml and rub in once a day. 01/01/13   Evie Lacks Plotnikov, MD  triamcinolone ointment (KENALOG) 0.5 % Apply 1 application topically 2 (two) times daily. 03/01/15   Evie Lacks Plotnikov, MD  zolpidem (AMBIEN) 10 MG tablet Take 0.5 tablets (5 mg total) by mouth at bedtime as needed. 11/28/10   Cassandria Anger, MD    Family History Family History  Problem Relation Age of Onset  . Cancer Mother 11    leukemia  . Hypertension Other     Social History Social History  Substance Use Topics  . Smoking status: Former Research scientist (life sciences)  . Smokeless tobacco: Never Used  . Alcohol use 1.8 oz/week    3 Glasses of wine per week     Allergies   Review of patient's allergies indicates no known allergies.   Review of Systems Review of Systems  All other systems reviewed and are negative.    Physical Exam Updated Vital Signs BP 131/95   Pulse (!) 55   Temp 97.7 F (36.5 C) (Oral)   Resp 13   Ht 5\' 10"  (1.778 m)   Wt 102.1 kg   SpO2 98%   BMI 32.28 kg/m   Physical Exam  Constitutional: He is oriented to person, place, and time. He appears well-developed  and well-nourished. No distress.  HENT:  Head: Normocephalic and atraumatic.  Right Ear: External ear normal.  Left Ear: External ear normal.  Nose: Nose normal.  Mouth/Throat: Oropharynx is clear and moist.  Eyes: Conjunctivae and EOM are normal. Pupils are equal, round, and reactive to light.  Neck: Normal range of motion. Neck supple.  Cardiovascular: Normal rate, regular rhythm, normal heart sounds and intact distal pulses.   Pulmonary/Chest: Effort normal and breath sounds normal.  Abdominal: Soft. Bowel sounds are normal.  Musculoskeletal: Normal range of motion.  Neurological: He is alert and oriented to person, place, and time. He has normal reflexes. He exhibits normal muscle tone.  Right arm  drift Right leg weaker than right  Skin: Skin is warm and dry. Capillary refill takes less than 2 seconds.  Psychiatric: He has a normal mood and affect. His behavior is normal. Judgment and thought content normal.  Nursing Clements and vitals reviewed.    ED Treatments / Results  Labs (all labs ordered are listed, but only abnormal results are displayed) Labs Reviewed  COMPREHENSIVE METABOLIC PANEL - Abnormal; Notable for the following:       Result Value   CO2 20 (*)    Glucose, Bld 182 (*)    All other components within normal limits  CBG MONITORING, ED - Abnormal; Notable for the following:    Glucose-Capillary 189 (*)    All other components within normal limits  I-STAT CHEM 8, ED - Abnormal; Notable for the following:    Glucose, Bld 179 (*)    Calcium, Ion 1.08 (*)    All other components within normal limits  PROTIME-INR  APTT  CBC  DIFFERENTIAL  I-STAT TROPOININ, ED    EKG  EKG Interpretation  Date/Time:  Friday January 04 2016 12:46:27 EDT Ventricular Rate:  76 PR Interval:  216 QRS Duration: 86 QT Interval:  382 QTC Calculation: 429 R Axis:   29 Text Interpretation:  Sinus rhythm with 1st degree A-V block Anterior infarct , age undetermined Abnormal ECG Confirmed by Jolyne Laye MD, Andee Poles [phone]) on 01/04/2016 2:48:41 PM       Radiology Ct Head Wo Contrast  Result Date: 01/04/2016 CLINICAL DATA:  Right upper extremity numbness and unsteady gait. History of hypertension. Diabetes. EXAM: CT HEAD WITHOUT CONTRAST TECHNIQUE: Contiguous axial images were obtained from the base of the skull through the vertex without intravenous contrast. COMPARISON:  None. FINDINGS: Brain: Mild low density in the periventricular white matter likely related to small vessel disease. No mass lesion, hemorrhage, hydrocephalus, acute infarct, intra-axial, or extra-axial fluid collection. Vascular: No hyperdense vessel or unexpected calcification. Skull: Normal Sinuses/Orbits: Normal orbits  and globes. Right ethmoid air cell mucous retention cyst or polyp. Clear mastoid air cells. Other: None IMPRESSION: 1.  No acute intracranial abnormality. 2. Mild small vessel ischemic change. Electronically Signed   By: Abigail Miyamoto M.D.   On: 01/04/2016 13:12    Procedures Procedures (including critical care time)  Medications Ordered in ED Medications - No data to display   Initial Impression / Assessment and Plan / ED Course  I have reviewed the triage vital signs and the nursing notes.  Pertinent labs & imaging results that were available during my care of the patient were reviewed by me and considered in my medical decision making (see chart for details). Discussed with hospitalist and will admit.   Clinical Course    Discussed with neurology.  Will consult hositalist to admit.  1-stroke 2- dm with hyperglycemia  Final Clinical Impressions(s) / ED Diagnoses   Final diagnoses:  Cerebrovascular accident (CVA), unspecified mechanism ([place])    New Prescriptions New Prescriptions   No medications on file     Pattricia Boss, MD 01/04/16 [phone]

## 2016-01-04 NOTE — H&P (Signed)
[facility] History and Physical   Patient: Christian Clements [id number]   PCP: Walker Kehr, MD DOB: Sep 23, 1950   DOA: 01/04/2016   DOS: 01/04/2016   DOS: the patient was seen and examined on 01/04/2016  Patient coming from: The patient is coming from home.  Chief Complaint: Numbness in balance  HPI: Christian Clements is a 65 y.o. male with Past medical history of diabetes, hypertension. Patient presented with complains of numbness of the right hand ongoing since Wednesday associated with numbness of the right leg as well as imbalance that he noticed on Thursday. Denies any fall trauma or injury. No dizziness no lightheadedness. No nausea no vomiting. No fever no chills. Symptoms are persistent and not improving. No diarrhea no constipation. No similar symptoms in the past. Patient went to gym where he was having difficulty walking and then he was developed by a physician friend and was brought to the hospital. ED Course: CT scan was unremarkable.  At his baseline ambulates without any support And is independent for most of his ADL; manages his medication on his own.  Review of Systems: as mentioned in the history of present illness.  All other systems reviewed and are negative.  Past Medical History:  Diagnosis Date  . Diabetes mellitus   . Hypertension   . Prostatitis 2011   History reviewed. No pertinent surgical history. Social History:  reports that he has quit smoking. He has never used smokeless tobacco. He reports that he drinks about 1.8 oz of alcohol per week . He reports that he does not use drugs.  No Known Allergies   Family History  Problem Relation Age of Onset  . Cancer Mother 1    leukemia  . Hypertension Other      Prior to Admission medications   Medication Sig Start Date End Date Taking? Authorizing Provider  atorvastatin (LIPITOR) 20 MG tablet Take 1 tablet (20 mg total) by mouth daily. Patient taking differently: Take 10 mg by mouth daily.   03/01/15  Yes Cassandria Anger, MD  Cholecalciferol 1000 UNITS tablet Take 1,000 Units by mouth daily.     Yes Historical Provider, MD  hydrocortisone (ANUSOL-HC) 25 MG suppository Place 1 suppository (25 mg total) rectally 2 (two) times daily. 12/28/13  Yes Evie Lacks Plotnikov, MD  metFORMIN (GLUCOPHAGE) 1000 MG tablet Take 1 tablet (1,000 mg total) by mouth 2 (two) times daily with a meal. 03/01/15  Yes Evie Lacks Plotnikov, MD  PROBIOTIC CAPS Take 1 capsule by mouth daily.     Yes Historical Provider, MD  quinapril-hydrochlorothiazide (ACCURETIC) 20-12.5 MG tablet Take 2 tablets by mouth daily. 03/01/15  Yes Evie Lacks Plotnikov, MD  triamcinolone ointment (KENALOG) 0.5 % Apply 1 application topically 2 (two) times daily. 03/01/15  Yes Evie Lacks Plotnikov, MD  zolpidem (AMBIEN) 10 MG tablet Take 0.5 tablets (5 mg total) by mouth at bedtime as needed. 11/28/10  Yes Cassandria Anger, MD  Alogliptin-Pioglitazone 25-30 MG TABS 1 po qd Patient not taking: Reported on 01/04/2016 03/01/15   Cassandria Anger, MD  sildenafil (VIAGRA) 100 MG tablet Take 1 tablet (100 mg total) by mouth daily as needed. 12/28/13   Cassandria Anger, MD  Testosterone Micronized CRYS Apply 46ml and rub in once a day. Patient not taking: Reported on 01/04/2016 01/01/13   Cassandria Anger, MD    Physical Exam: Vitals:   01/04/16 1515 01/04/16 1530 01/04/16 1548 01/04/16 1615  BP: 150/95 137/97  132/88  Pulse: (!) 55 Marland Kitchen)  58  (!) 51  Resp: 24 13  14   Temp:   97.8 F (36.6 C)   TempSrc:      SpO2: 99% 98%  97%  Weight:      Height:        General: Alert, Awake and Oriented to Time, Place and Person. Appear in mild distress, affect appropriate Eyes: PERRL, Conjunctiva normal ENT: Oral Mucosa clear moist. Neck: no JVD, no Abnormal Mass Or lumps Cardiovascular: S1 and S2 Present, no Murmur, Peripheral Pulses Present Respiratory: Bilateral Air entry equal and Decreased, no use of accessory muscle, Clear to  Auscultation, no Crackles, no wheezes Abdomen: Bowel Sound present, Soft and no tenderness Skin: no redness, no Rash, no induration Extremities: no Pedal edema, no calf tenderness Neurologic: Mental status AAOx3, speech normal, attention normal,  Cranial Nerves PERRL, EOM normal and present,  Motor strength left strength 5/5, right 4/5 Sensation present to light touch, Cerebellar test normal finger nose finger. Gait not checked due to patient safety concerns.   Labs on Admission:  CBC:  Recent Labs Lab 01/04/16 1255 01/04/16 1314  WBC 9.3  --   NEUTROABS 5.5  --   HGB 15.8 15.6  HCT 44.3 46.0  MCV 88.6  --   PLT 256  --    Basic Metabolic Panel:  Recent Labs Lab 01/04/16 1255 01/04/16 1314  NA 138 138  K 3.5 3.8  CL 105 102  CO2 20*  --   GLUCOSE 182* 179*  BUN 9 13  CREATININE 1.04 0.90  CALCIUM 9.8  --    GFR: Estimated Creatinine Clearance: 97.9 mL/min (by C-G formula based on SCr of 0.9 mg/dL). Liver Function Tests:  Recent Labs Lab 01/04/16 1255  AST 21  ALT 26  ALKPHOS 61  BILITOT 0.8  PROT 7.7  ALBUMIN 4.6   No results for input(s): LIPASE, AMYLASE in the last 168 hours. No results for input(s): AMMONIA in the last 168 hours. Coagulation Profile:  Recent Labs Lab 01/04/16 1255  INR 1.04   Cardiac Enzymes: No results for input(s): CKTOTAL, CKMB, CKMBINDEX, TROPONINI in the last 168 hours. BNP (last 3 results) No results for input(s): PROBNP in the last 8760 hours. HbA1C: No results for input(s): HGBA1C in the last 72 hours. CBG:  Recent Labs Lab 01/04/16 1254  GLUCAP 189*   Lipid Profile: No results for input(s): CHOL, HDL, LDLCALC, TRIG, CHOLHDL, LDLDIRECT in the last 72 hours. Thyroid Function Tests: No results for input(s): TSH, T4TOTAL, FREET4, T3FREE, THYROIDAB in the last 72 hours. Anemia Panel: No results for input(s): VITAMINB12, FOLATE, FERRITIN, TIBC, IRON, RETICCTPCT in the last 72 hours. Urine analysis:    Component  Value Date/Time   COLORURINE LT. YELLOW 11/08/2012 0805   APPEARANCEUR CLEAR 11/08/2012 0805   LABSPEC 1.020 11/08/2012 0805   PHURINE 6.0 11/08/2012 0805   GLUCOSEU NEGATIVE 11/08/2012 0805   HGBUR SMALL 11/08/2012 0805   BILIRUBINUR NEGATIVE 11/08/2012 0805   KETONESUR NEGATIVE 11/08/2012 0805   UROBILINOGEN 1.0 11/08/2012 0805   NITRITE NEGATIVE 11/08/2012 0805   LEUKOCYTESUR TRACE 11/08/2012 0805    Radiological Exams on Admission: Ct Head Wo Contrast  Result Date: 01/04/2016 CLINICAL DATA:  Right upper extremity numbness and unsteady gait. History of hypertension. Diabetes. EXAM: CT HEAD WITHOUT CONTRAST TECHNIQUE: Contiguous axial images were obtained from the base of the skull through the vertex without intravenous contrast. COMPARISON:  None. FINDINGS: Brain: Mild low density in the periventricular white matter likely related to small vessel disease.  No mass lesion, hemorrhage, hydrocephalus, acute infarct, intra-axial, or extra-axial fluid collection. Vascular: No hyperdense vessel or unexpected calcification. Skull: Normal Sinuses/Orbits: Normal orbits and globes. Right ethmoid air cell mucous retention cyst or polyp. Clear mastoid air cells. Other: None IMPRESSION: 1.  No acute intracranial abnormality. 2. Mild small vessel ischemic change. Electronically Signed   By: Abigail Miyamoto M.D.   On: 01/04/2016 13:12   EKG: Independently reviewed. normal sinus rhythm, nonspecific ST and T waves changes.  Assessment/Plan 1. CVA (cerebral vascular accident) [facility]) MRA brain positive for some acute stroke. Neurology consult. On aspirin. Increase Lipitor to 40 mg. Passed stroke swallowing test. PTOT speech consult. Vascular Doppler ordered. Continue telemetry monitoring. Check hemoglobin A1c as well as LDL in the morning.  2. Type 2 diabetes mellitus. Continue sliding scale insulin at present. Currently every 6 hours as the patient will remain nothing by mouth until speech clears the  patient.  3. Essential hypertension. Allowing performance of hypertension at present. Excellent continue to monitor.   Nutrition: Nothing by mouth until passing stroke swallow test DVT Prophylaxis: subcutaneous Heparin  Advance goals of care discussion: full   Consults: neurology  Family Communication: family was present at bedside, at the time of interview.  Opportunity was given to ask question and all questions were answered satisfactorily.  Disposition: Admitted as observation, telemetryunit. Likely to be discharged home, in 2 days.  Author: Berle Mull, MD [facility] Pager: [phone] 01/04/2016  If 7PM-7AM, please contact night-coverage [email or web address] Password [email or web address]

## 2016-01-04 NOTE — Progress Notes (Signed)
Patient admitted to 5M16. Patient is alert, oriented x4, skin intact, NIHHS 1 for sensory (tingling in right arm). Denies pain. Tele set up box 21. Will continue to monitor.

## 2016-01-04 NOTE — Consult Note (Signed)
Requesting Physician: Dr. Jeanell Sparrow    Chief Complaint: Stroke  History obtained from:  Patient and family  HPI:                                                                                                                                         Christian Clements is an 65 y.o. male with a past medical history significant for hypertension and diabetes presented to [facility] ED this afternoon with complaints of right sided weakness. Patient reports noticing problems with fine motor skills on Wednesday (01/02/16) morning and quickly noticed a reduction in right leg strength shortly thereafter along with a sensation that he could not control his right leg and clumsiness.  His main complaint with decreased sensation in his right hand is the tips of his ring finger middle finger and some of his thumb. Continues to have these sensations.Patient's symptoms persisted through 01/03/16 and today 01/04/16 he noticed that he had difficulty controlling his right leg, noting that he felt off-balance like he "couldn't get [his] leg to catch up with the other", which prompted his ED arrival.  Patient states that he takes aspirin on a daily basis. He also usually has his blood pressure under control along with his cholesterol and gets his yearly checkup.  Date last known well: Date: 01/02/2016 Time last known well: Unable to determine tPA Given: No: out of window   Past Medical History:  Diagnosis Date  . Diabetes mellitus   . Hypertension   . Prostatitis 2011    History reviewed. No pertinent surgical history.  Family History  Problem Relation Age of Onset  . Cancer Mother 63    leukemia  . Hypertension Other    Social History:  reports that he has quit smoking. He has never used smokeless tobacco. He reports that he drinks about 1.8 oz of alcohol per week . He reports that he does not use drugs.  Allergies: No Known Allergies  Medications:                                                                                                                            No current facility-administered medications for this encounter.    Current Outpatient Prescriptions  Medication Sig Dispense Refill  . Alogliptin-Pioglitazone 25-30 MG TABS 1 po qd 90 tablet 3  . atorvastatin (LIPITOR) 20 MG tablet Take 1  tablet (20 mg total) by mouth daily. 90 tablet 3  . Cholecalciferol 1000 UNITS tablet Take 1,000 Units by mouth daily.      . hydrocortisone (ANUSOL-HC) 25 MG suppository Place 1 suppository (25 mg total) rectally 2 (two) times daily. 20 suppository 3  . metFORMIN (GLUCOPHAGE) 1000 MG tablet Take 1 tablet (1,000 mg total) by mouth 2 (two) times daily with a meal. 180 tablet 3  . PROBIOTIC CAPS Take 1 capsule by mouth daily.      . quinapril-hydrochlorothiazide (ACCURETIC) 20-12.5 MG tablet Take 2 tablets by mouth daily. 180 tablet 2  . sildenafil (VIAGRA) 100 MG tablet Take 1 tablet (100 mg total) by mouth daily as needed. 10 tablet 11  . Testosterone Micronized CRYS Apply 10ml and rub in once a day. 60 Bottle 5  . triamcinolone ointment (KENALOG) 0.5 % Apply 1 application topically 2 (two) times daily. 30 g 1  . zolpidem (AMBIEN) 10 MG tablet Take 0.5 tablets (5 mg total) by mouth at bedtime as needed. 30 tablet 5   :  ROS:                                                                                                                                       History obtained from the patient  General ROS: negative for - chills, fatigue, fever, night sweats, weight gain or weight loss Psychological ROS: negative for - behavioral disorder, hallucinations, memory difficulties, mood swings or suicidal ideation Ophthalmic ROS: negative for - blurry vision, double vision, eye pain or loss of vision ENT ROS: negative for - epistaxis, nasal discharge, oral lesions, sore throat, tinnitus or vertigo Allergy and Immunology ROS: negative for - hives or itchy/watery eyes Hematological and Lymphatic ROS: negative for -  bleeding problems, bruising or swollen lymph nodes Endocrine ROS: negative for - galactorrhea, hair pattern changes, polydipsia/polyuria or temperature intolerance Respiratory ROS: negative for - cough, hemoptysis, shortness of breath or wheezing Cardiovascular ROS: negative for - chest pain, dyspnea on exertion, edema or irregular heartbeat Gastrointestinal ROS: negative for - abdominal pain, diarrhea, hematemesis, nausea/vomiting or stool incontinence Genito-Urinary ROS: negative for - dysuria, hematuria, incontinence or urinary frequency/urgency Musculoskeletal ROS: negative for - joint swelling; positive for right arm and leg weakness Neurological ROS: as noted in HPI Dermatological ROS: negative for rash and skin lesion changes  Neurologic Examination:  Blood pressure 144/85, pulse (!) 56, temperature 97.7 F (36.5 C), temperature source Oral, resp. rate 13, height 5\' 10"  (1.778 m), weight 102.1 kg (225 lb), SpO2 98 %.  HEENT-  Normocephalic, no lesions, without obvious abnormality.  Normal external eye and conjunctiva.  Normal TM's bilaterally.  Normal external ears. Normal external nose, mucus membranes and septum.  Normal pharynx. Cardiovascular- S1, S2 normal, pulses palpable throughout   Lungs- chest clear, no wheezing, rales, normal symmetric air entry, Heart exam - S1, S2 normal, no murmur, no gallop, rate regular Abdomen- normal findings: bowel sounds normal Extremities- reduced strength in the right arm and leg; left-side 5/5 strength Lymph-no adenopathy palpable Musculoskeletal-no joint tenderness, deformity or swelling, as above Skin-warm and dry, no hyperpigmentation, vitiligo, or suspicious lesions  Neurological Examination Mental Status: Alert, oriented, thought content appropriate.  Speech fluent without evidence of aphasia.  Able to follow 3 step commands without  difficulty. Cranial Nerves: II: Visual fields grossly normal, pupils equal, round, reactive to light and accommodation III,IV, VI: ptosis not present, extra-ocular motions intact bilaterally V,VII: smile symmetric, facial light touch sensation normal bilaterally VIII: hearing normal bilaterally IX,X: uvula rises symmetrically XI: bilateral shoulder shrug XII: midline tongue extension Motor: Right : Upper extremity   4/5    Left:     Upper extremity   5/5  Lower extremity   4/5     Lower extremity   5/5 --Positive pronator drift on the right, positive orbiting of left hand around right hand, no drift with the right leg Tone and bulk:normal tone throughout; no atrophy noted Sensory: Pinprick and light touch intact throughout, bilaterally Deep Tendon Reflexes: 2+ and symmetric throughout Plantars: Right: downgoing   Left: downgoing Cerebellar: normal finger-to-nose,  and normal heel-to-shin test--he will did show clumsiness with finger to finger tapping on his right hand Gait: not tested       Lab Results: Basic Metabolic Panel:  Recent Labs Lab 01/04/16 1255 01/04/16 1314  NA 138 138  K 3.5 3.8  CL 105 102  CO2 20*  --   GLUCOSE 182* 179*  BUN 9 13  CREATININE 1.04 0.90  CALCIUM 9.8  --     Liver Function Tests:  Recent Labs Lab 01/04/16 1255  AST 21  ALT 26  ALKPHOS 61  BILITOT 0.8  PROT 7.7  ALBUMIN 4.6   No results for input(s): LIPASE, AMYLASE in the last 168 hours. No results for input(s): AMMONIA in the last 168 hours.  CBC:  Recent Labs Lab 01/04/16 1255 01/04/16 1314  WBC 9.3  --   NEUTROABS 5.5  --   HGB 15.8 15.6  HCT 44.3 46.0  MCV 88.6  --   PLT 256  --     Cardiac Enzymes: No results for input(s): CKTOTAL, CKMB, CKMBINDEX, TROPONINI in the last 168 hours.  Lipid Panel: No results for input(s): CHOL, TRIG, HDL, CHOLHDL, VLDL, LDLCALC in the last 168 hours.  CBG:  Recent Labs Lab 01/04/16 1254  GLUCAP 189*     Microbiology: No results found for this or any previous visit.  Coagulation Studies:  Recent Labs  01/04/16 1255  LABPROT 13.7  INR 1.04    Imaging: Ct Head Wo Contrast  Result Date: 01/04/2016 CLINICAL DATA:  Right upper extremity numbness and unsteady gait. History of hypertension. Diabetes. EXAM: CT HEAD WITHOUT CONTRAST TECHNIQUE: Contiguous axial images were obtained from the base of the skull through the vertex without intravenous contrast. COMPARISON:  None. FINDINGS: Brain: Mild low  density in the periventricular white matter likely related to small vessel disease. No mass lesion, hemorrhage, hydrocephalus, acute infarct, intra-axial, or extra-axial fluid collection. Vascular: No hyperdense vessel or unexpected calcification. Skull: Normal Sinuses/Orbits: Normal orbits and globes. Right ethmoid air cell mucous retention cyst or polyp. Clear mastoid air cells. Other: None IMPRESSION: 1.  No acute intracranial abnormality. 2. Mild small vessel ischemic change. Electronically Signed   By: Abigail Miyamoto M.D.   On: 01/04/2016 13:12       Assessment and plan discussed with with attending physician and they are in agreement.    Etta Quill PA-C [facility] [phone]  01/04/2016, 3:07 PM   Assessment: 65 y.o. male with 2 strokes, one in the anterior and one in the posterior circulation. I am concerned for possible cardiac emboli, and the patient will need to be evaluated for such.  Stroke Risk Factors - hyperlipidemia, diabetes, hypertension  Recommend: 1. HgbA1c, fasting lipid panel 2. MRI, MRA  of the brain without contrast 3. PT consult, OT consult, Speech consult 4. Echocardiogram 5. Carotid dopplers 6. Prophylactic therapy-Antiplatelet med: Aspirin - dose 325 mg daily 7. Risk factor modification 8. Telemetry monitoring 9. Frequent neuro checks 10 NPO until passes stroke swallow screen 11 please page stroke NP  Or  PA  Or MD from 8am -4 pm  as this  patient from this time will be  followed by the stroke.   You can look them up on [email or web address]  Password [email or web address]   Roland Rack, MD [facility] [phone]  If 7pm- 7am, please page neurology on call as listed in [place].

## 2016-01-05 ENCOUNTER — Observation Stay (HOSPITAL_COMMUNITY): Payer: Medicare Other

## 2016-01-05 DIAGNOSIS — I6302 Cerebral infarction due to thrombosis of basilar artery: Secondary | ICD-10-CM | POA: Diagnosis not present

## 2016-01-05 DIAGNOSIS — R531 Weakness: Secondary | ICD-10-CM

## 2016-01-05 DIAGNOSIS — R001 Bradycardia, unspecified: Secondary | ICD-10-CM | POA: Diagnosis present

## 2016-01-05 DIAGNOSIS — Z87891 Personal history of nicotine dependence: Secondary | ICD-10-CM | POA: Diagnosis not present

## 2016-01-05 DIAGNOSIS — I639 Cerebral infarction, unspecified: Secondary | ICD-10-CM

## 2016-01-05 DIAGNOSIS — E119 Type 2 diabetes mellitus without complications: Secondary | ICD-10-CM | POA: Diagnosis present

## 2016-01-05 DIAGNOSIS — Z8249 Family history of ischemic heart disease and other diseases of the circulatory system: Secondary | ICD-10-CM | POA: Diagnosis not present

## 2016-01-05 DIAGNOSIS — E1169 Type 2 diabetes mellitus with other specified complication: Secondary | ICD-10-CM | POA: Diagnosis present

## 2016-01-05 DIAGNOSIS — E785 Hyperlipidemia, unspecified: Secondary | ICD-10-CM | POA: Diagnosis present

## 2016-01-05 DIAGNOSIS — G8191 Hemiplegia, unspecified affecting right dominant side: Secondary | ICD-10-CM

## 2016-01-05 DIAGNOSIS — I10 Essential (primary) hypertension: Secondary | ICD-10-CM | POA: Diagnosis present

## 2016-01-05 DIAGNOSIS — E876 Hypokalemia: Secondary | ICD-10-CM

## 2016-01-05 DIAGNOSIS — I69359 Hemiplegia and hemiparesis following cerebral infarction affecting unspecified side: Secondary | ICD-10-CM | POA: Diagnosis present

## 2016-01-05 DIAGNOSIS — Z7901 Long term (current) use of anticoagulants: Secondary | ICD-10-CM | POA: Diagnosis not present

## 2016-01-05 DIAGNOSIS — I6789 Other cerebrovascular disease: Secondary | ICD-10-CM | POA: Diagnosis not present

## 2016-01-05 LAB — LIPID PANEL
CHOL/HDL RATIO: 3.1 ratio
Cholesterol: 92 mg/dL (ref 0–200)
HDL: 30 mg/dL — ABNORMAL LOW (ref >40)
LDL Cholesterol: 27 mg/dL (ref 0–99)
Triglycerides: 174 mg/dL — ABNORMAL HIGH (ref <150)
VLDL: 35 mg/dL (ref 0–40)

## 2016-01-05 LAB — CBC
HCT: 42.2 % (ref 39.0–52.0)
Hemoglobin: 14.8 g/dL (ref 13.0–17.0)
MCH: 31.3 pg (ref 26.0–34.0)
MCHC: 35.1 g/dL (ref 30.0–36.0)
MCV: 89.2 fL (ref 78.0–100.0)
PLATELETS: 251 10*3/uL (ref 150–400)
RBC: 4.73 MIL/uL (ref 4.22–5.81)
RDW: 12.6 % (ref 11.5–15.5)
WBC: 7.5 10*3/uL (ref 4.0–10.5)

## 2016-01-05 LAB — GLUCOSE, CAPILLARY
GLUCOSE-CAPILLARY: 195 mg/dL — AB (ref 65–99)
GLUCOSE-CAPILLARY: 177 mg/dL — AB (ref 65–99)
Glucose-Capillary: 280 mg/dL — ABNORMAL HIGH (ref 65–99)
Glucose-Capillary: 106 mg/dL — ABNORMAL HIGH (ref 65–99)

## 2016-01-05 LAB — BASIC METABOLIC PANEL
ANION GAP: 11 (ref 5–15)
BUN: 10 mg/dL (ref 6–20)
CO2: 25 mmol/L (ref 22–32)
Calcium: 8.9 mg/dL (ref 8.9–10.3)
Chloride: 103 mmol/L (ref 101–111)
Creatinine, Ser: 1.01 mg/dL (ref 0.61–1.24)
GFR calc Af Amer: >60 mL/min (ref >60)
Glucose, Bld: 180 mg/dL — ABNORMAL HIGH (ref 65–99)
POTASSIUM: 3 mmol/L — AB (ref 3.5–5.1)
SODIUM: 139 mmol/L (ref 135–145)

## 2016-01-05 LAB — RAPID URINE DRUG SCREEN, HOSP PERFORMED
Amphetamines: NOT DETECTED
Barbiturates: NOT DETECTED
Benzodiazepines: NOT DETECTED
Cocaine: NOT DETECTED
OPIATES: NOT DETECTED
TETRAHYDROCANNABINOL: NOT DETECTED

## 2016-01-05 MED ORDER — INSULIN ASPART 100 UNIT/ML ~~LOC~~ SOLN
0.0000 [IU] | Freq: Three times a day (TID) | SUBCUTANEOUS | Status: DC
  Administered 2016-01-05: 8 [IU] via SUBCUTANEOUS
  Administered 2016-01-06: 11 [IU] via SUBCUTANEOUS
  Administered 2016-01-06: 3 [IU] via SUBCUTANEOUS

## 2016-01-05 MED ORDER — ATORVASTATIN CALCIUM 40 MG PO TABS: 40.0000 mg | ORAL_TABLET | Freq: Every day | ORAL | Status: DC

## 2016-01-05 MED ORDER — INSULIN ASPART 100 UNIT/ML ~~LOC~~ SOLN: 0.0000 [IU] | Freq: Every day | SUBCUTANEOUS | Status: DC

## 2016-01-05 MED ORDER — POTASSIUM CHLORIDE 20 MEQ PO PACK
20.0000 meq | PACK | Freq: Two times a day (BID) | ORAL | Status: DC
  Administered 2016-01-05 – 2016-01-06 (×3): 20 meq via ORAL
  Filled 2016-01-05 (×4): qty 1

## 2016-01-05 MED ORDER — INSULIN ASPART 100 UNIT/ML ~~LOC~~ SOLN: 0.0000 [IU] | Freq: Three times a day (TID) | SUBCUTANEOUS | Status: DC

## 2016-01-05 NOTE — Progress Notes (Signed)
STROKE TEAM PROGRESS NOTE   HISTORY OF PRESENT ILLNESS (per record) Christian Clements is an 65 y.o. male with a past medical history significant for hypertension and diabetes presented to [facility] ED this afternoon with complaints of right sided weakness. Patient reports noticing problems with fine motor skills on Wednesday (01/02/16) morning and quickly noticed a reduction in right leg strength shortly thereafter along with a sensation that he could not control his right leg and clumsiness.  His main complaint with decreased sensation in his right hand is the tips of his ring finger middle finger and some of his thumb. Continues to have these sensations.Patient's symptoms persisted through 01/03/16 and today 01/04/16 he noticed that he had difficulty controlling his right leg, noting that he felt off-balance like he "couldn't get [his] leg to catch up with the other", which prompted his ED arrival.  Patient states that he takes aspirin on a daily basis. He also usually has his blood pressure under control along with his cholesterol and gets his yearly checkup.  Date last known well: Date: 01/02/2016 Time last known well: Unable to determine tPA Given: No: out of window   SUBJECTIVE (INTERVAL HISTORY) His RN is at the bedside.  Overall he feels his condition is gradually improving. He still has mild right hemiparesis. Recommended 30 day cardiac monitor as outpt and he is in agreement.    OBJECTIVE Temp:  [97.7 F (36.5 C)-98.8 F (37.1 C)] 97.8 F (36.6 C) (10/14 1256) Pulse Rate:  [49-61] 53 (10/14 1256) Cardiac Rhythm: Sinus bradycardia;Heart block (10/14 0726) Resp:  [13-24] 16 (10/14 1256) BP: (132-157)/(80-97) 141/84 (10/14 1256) SpO2:  [95 %-99 %] 95 % (10/14 1256)  CBC:   Recent Labs Lab 01/04/16 1255 01/04/16 1314 01/05/16 0244  WBC 9.3  --  7.5  NEUTROABS 5.5  --   --   HGB 15.8 15.6 14.8  HCT 44.3 46.0 42.2  MCV 88.6  --  89.2  PLT 256  --  [id number]    Basic Metabolic Panel:    Recent Labs Lab 01/04/16 1255 01/04/16 1314 01/05/16 0244  NA 138 138 139  K 3.5 3.8 3.0*  CL 105 102 103  CO2 20*  --  25  GLUCOSE 182* 179* 180*  BUN 9 13 10   CREATININE 1.04 0.90 1.01  CALCIUM 9.8  --  8.9    Lipid Panel:     Component Value Date/Time   CHOL 92 01/05/2016 0244   TRIG 174 (H) 01/05/2016 0244   HDL 30 (L) 01/05/2016 0244   CHOLHDL 3.1 01/05/2016 0244   VLDL 35 01/05/2016 0244   LDLCALC 27 01/05/2016 0244   HgbA1c:  Lab Results  Component Value Date   HGBA1C 7.7 (H) 02/12/2015   Urine Drug Screen:     Component Value Date/Time   LABOPIA NONE DETECTED 01/05/2016 0823   COCAINSCRNUR NONE DETECTED 01/05/2016 0823   LABBENZ NONE DETECTED 01/05/2016 0823   AMPHETMU NONE DETECTED 01/05/2016 0823   THCU NONE DETECTED 01/05/2016 0823   LABBARB NONE DETECTED 01/05/2016 0823      IMAGING I have personally reviewed the radiological images below and agree with the radiology interpretations.  Ct Head Wo Contrast 01/04/2016 1.  No acute intracranial abnormality.  2. Mild small vessel ischemic change.   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  Small acute left frontal subcortical white matter infarction.  These 2 acute infarctions could be  coincidental in due to ordinary small vessel disease, but emboli from the heart or ascending aorta could also result in this.  Moderate chronic small-vessel ischemic changes of the cerebral hemispheric white matter for a age.  Negative intracranial MR angiography of the large and medium size vessels.   CUS - pending  TTE pending   PHYSICAL EXAM  Temp:  [97.7 F (36.5 C)-98.8 F (37.1 C)] 97.8 F (36.6 C) (10/14 1256) Pulse Rate:  [49-61] 53 (10/14 1256) Resp:  [14-18] 16 (10/14 1256) BP: (132-157)/(80-95) 141/84 (10/14 1256) SpO2:  [95 %-99 %] 95 % (10/14 1256)  General - Well nourished, well developed, in no  apparent distress.  Ophthalmologic - Sharp disc margins OU.   Cardiovascular - Regular rate and rhythm.  Mental Status -  Level of arousal and orientation to time, place, and person were intact. Language including expression, naming, repetition, comprehension was assessed and found intact. Fund of Knowledge was assessed and was intact.  Cranial Nerves II - XII - II - Visual field intact OU. III, IV, VI - Extraocular movements intact. V - Facial sensation intact bilaterally. VII - mild right nasolabial fold flattening. VIII - Hearing & vestibular intact bilaterally. X - Palate elevates symmetrically. XI - Chin turning & shoulder shrug intact bilaterally. XII - Tongue protrusion intact.  Motor Strength - The patient's strength was normal in all extremities except right pronator drift.  Bulk was normal and fasciculations were absent.   Motor Tone - Muscle tone was assessed at the neck and appendages and was normal.  Reflexes - The patient's reflexes were 1+ in all extremities and he had no pathological reflexes.  Sensory - Light touch, temperature/pinprick were assessed and were symmetrical.    Coordination - The patient had normal movements in the hands and feet with no ataxia or dysmetria.  Tremor was absent.  Gait and Station - deferred.    ASSESSMENT/PLAN Christian Clements is a 65 y.o. male with history of hypertension and diabetes mellitus presenting with right hemiparesis. He did not receive IV t-PA due to late presentation.  Strokes: Left paramedian pontine and left frontal small infarcts possibly synchronized small vessel disease. However, cardioembolic cannot be completely excluded.  Resultant  mild right facial droop and right pronator drift  MRI - 2 small acute infarcts - ventral pontomedullary junction and left frontal subcortical white matter.  MRA - negative  Carotid Doppler pending  2D Echo pending  Recommend 30 day cardiac event monitoring as outpatient  to rule out A. fib  LDL - 27  HgbA1c pending  UDS negative  VTE prophylaxis - Lovenox Diet heart healthy/carb modified Room service appropriate? Yes; Fluid consistency: Thin  No antithrombotic prior to admission, now on aspirin 325 mg daily.   Patient counseled to be compliant with his antithrombotic medications  Ongoing aggressive stroke risk factor management  Therapy recommendations: Outpatient OT recommended. PT evaluation pending.  Disposition: Pending  Hypertension  Stable  Permissive hypertension (OK if < 220/120) but gradually normalize in 5-7 days  Long-term BP goal normotensive  ? Hyperlipidemia  Home meds:  Lipitor 10 mg daily prior to admission  LDL 27, goal < 70  Due to low LDL, recommend off Lipitor for observation.  Follow-up with PCP to repeat LDL  Diabetes  HgbA1c pending, goal < 7.0  Uncontrolled  SSI  CBG monitoring  Fluctuating glucose levels, hyperglycemia  Other Stroke Risk Factors  Advanced age  Former cigarette smoker - the patient no longer smokes.  Occasional  ETOH use, advised to drink no more than 1 - 2 drink(s) a day  Obesity, Body mass index is 32.28 kg/m., recommend weight loss, diet and exercise as appropriate    Other Active Problems  Bradycardia - 40s to 50s  Hypokalemia - 3.0 -> supplement  Hospital day # 0  Rosalin Hawking, MD PhD Stroke Neurology 01/05/2016 3:58 PM   To contact Stroke Continuity provider, please refer to [email or web address] After hours, contact General Neurology

## 2016-01-05 NOTE — Progress Notes (Signed)
Occupational Therapy Evaluation Patient Details Name: Christian Clements MRN: [id number] DOB: 07/23/1950 Today's Date: 01/05/2016    History of Present Illness Admitted with R arm/leg numbness and weakness, difficulty walking, decreased balance. MRI showed 2 acute infarctions.   Clinical Impression   Patient presents to OT with decreased ADL independence and safety due to the deficits listed below. He will benefit from skilled OT to maximize function and to facilitate a safe discharge. OT will follow.    Follow Up Recommendations  Outpatient OT    Equipment Recommendations  None recommended by OT    Recommendations for Other Services PT consult     Precautions / Restrictions Precautions Precautions: Fall Restrictions Weight Bearing Restrictions: No      Mobility Bed Mobility               General bed mobility comments: NT -- up in recliner upon arrival  Transfers Overall transfer level: Needs assistance Equipment used: None Transfers: Sit to/from Stand Sit to Stand: Min guard;Min assist         General transfer comment: 1 LOB requring min A to correct while turning in room    Balance                                            ADL Overall ADL's : Needs assistance/impaired Eating/Feeding: Set up;Minimal assistance Eating/Feeding Details (indicate cue type and reason): reports difficulty manipulating utensils with RUE and using LUE to assist Grooming: Minimal assistance Grooming Details (indicate cue type and reason): difficulty with RUE, uses LUE to assist         Upper Body Dressing : Minimal assistance   Lower Body Dressing: Minimal assistance;Sit to/from stand   Toilet Transfer: Minimal assistance;Ambulation;Comfort height toilet;Grab bars           Functional mobility during ADLs: Minimal assistance General ADL Comments: Patient presents with RUE/RLE weakness, some balance deficits impacting ADLs. Is compensating by  using LUE to assist RUE. Recommend OP OT at discharge.     Vision Vision Assessment?: No apparent visual deficits   Perception     Praxis      Pertinent Vitals/Pain Pain Assessment: No/denies pain     Hand Dominance Right   Extremity/Trunk Assessment Upper Extremity Assessment Upper Extremity Assessment: RUE deficits/detail RUE Deficits / Details: AROM WFL, 4/5 grossly throughout, decreased fine motor coordination noted, pt reports numbness and tingling in hand (especially thumb and pointer finger) RUE Coordination: decreased fine motor   Lower Extremity Assessment Lower Extremity Assessment: Defer to PT evaluation       Communication Communication Communication: No difficulties;Expressive difficulties (occasionally speech seems slurred)   Cognition Arousal/Alertness: Awake/alert Behavior During Therapy: WFL for tasks assessed/performed Overall Cognitive Status: Within Functional Limits for tasks assessed                     General Comments       Exercises       Shoulder Instructions      Home Living Family/patient expects to be discharged to:: Private residence Living Arrangements: Spouse/significant other Available Help at Discharge: Family Type of Home: House Home Access: Stairs to enter Technical brewer of Steps: 2   Home Layout: Two level;Bed/bath upstairs Alternate Level Stairs-Number of Steps: flight   Bathroom Shower/Tub: Occupational psychologist: Standard     Home Equipment: Environmental consultant -  standard;Cane - single point   Additional Comments: 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      Prior Functioning/Environment Level of Independence: Independent        Comments: works out at [employer]        OT Problem List: Decreased strength;Decreased range of motion;Impaired balance (sitting and/or standing);Decreased coordination;Decreased knowledge of use of DME or AE;Impaired UE functional use   OT Treatment/Interventions:  Self-care/ADL training;Therapeutic exercise;Neuromuscular education;DME and/or AE instruction;Therapeutic activities;Patient/family education    OT Goals(Current goals can be found in the care plan section) Acute Rehab OT Goals Patient Stated Goal: to go home OT Goal Formulation: With patient Time For Goal Achievement: 01/19/16 Potential to Achieve Goals: Good ADL Goals Pt Will Perform Eating: with modified independence;sitting Pt Will Perform Grooming: with modified independence;standing Pt Will Perform Upper Body Dressing: with modified independence;sitting Pt Will Perform Lower Body Dressing: with modified independence;sit to/from stand Pt Will Transfer to Toilet: with modified independence;ambulating Pt Will Perform Toileting - Clothing Manipulation and hygiene: with modified independence;sit to/from stand Pt/caregiver will Perform Home Exercise Program: Right Upper extremity;Increased strength;With theraputty;Independently;With written HEP provided  OT Frequency: Min 2X/week   Barriers to D/C:            Co-evaluation              End of Session Nurse Communication: Mobility status  Activity Tolerance: Patient tolerated treatment well Patient left: in chair;with call bell/phone within reach;with chair alarm set   Time: 1012-1029 OT Time Calculation (min): 17 min Charges:  OT General Charges $OT Visit: 1 Procedure OT Evaluation $OT Eval Low Complexity: 1 Procedure G-Codes: OT G-codes **NOT FOR INPATIENT CLASS** Functional Assessment Tool Used: clinical judgment Functional Limitation: Self care Self Care Current Status [id number]): At least 20 percent but less than 40 percent impaired, limited or restricted Self Care Goal Status [id number]): At least 1 percent but less than 20 percent impaired, limited or restricted  Christian Clements A 01/05/2016, 10:51 AM

## 2016-01-05 NOTE — Progress Notes (Signed)
VASCULAR LAB PRELIMINARY  PRELIMINARY  PRELIMINARY  PRELIMINARY  Carotid duplex completed.    Preliminary report:  1-39% ICA plaquing. Vertebral artery flow is antegrade.   Rachel Samples, RVT 01/05/2016, 6:36 PM

## 2016-01-05 NOTE — Progress Notes (Signed)
Patient ID: Christian Clements, male   DOB: [birth year], 65 y.o.   MRN: [id number]  PROGRESS NOTE    Christian Clements  [id number] DOB: 1950/09/14 DOA: 01/04/2016  PCP: Walker Kehr, MD   Brief Narrative:  65 y.o. male with past medical history of diabetes, hypertension who presented to [facility] for evaluation of right arm numbness and tingling and right foot drop started 2-3 days prior to this admission. 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   Assessment & Plan:   Principal Problem:   Acute CVA (cerebral vascular accident) ([place]) - Aspirin daily - MRI brain / MRA brain - acute sub cm infarction at the ventral pontomedullary junction just to the left of midline and small acute left frontal subcortical white matter infarction. These 2 acute infarctions could be coincidental in due to ordinary small vessel disease, but emboli from the heart or ascending aorta could also result in this. 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  Objective: Vitals:   01/05/16 0600 01/05/16 0903 01/05/16 1256 01/05/16 1700  BP: (!) 145/80 (!)  147/91 (!) 141/84 (!) 148/91  Pulse: (!) 50 61 (!) 53 (!) 51  Resp: 16 16 16 20   Temp: 98.7 F (37.1 C) 97.7 F (36.5 C) 97.8 F (36.6 C) 97.9 F (36.6 C)  TempSrc: Oral Oral Oral Oral  SpO2: 96% 98% 95% 98%  Weight:      Height:        Intake/Output Summary (Last 24 hours) at 01/05/16 2013 Last data filed at 01/05/16 1255  Gross per 24 hour  Intake              600 ml  Output                0 ml  Net              600 ml   Filed Weights   01/04/16 1241  Weight: 102.1 kg (225 lb)    Examination:  General exam: Appears calm and comfortable  Respiratory system: Clear to auscultation. Respiratory effort normal. Cardiovascular system: S1 & S2 heard, RRR. No JVD, murmurs, rubs, gallops or clicks. No pedal edema. Gastrointestinal system: Abdomen is nondistended, soft and nontender. No organomegaly or masses felt. Normal bowel sounds heard. Central nervous system: Alert and oriented. Right arm tingling and right leg weakness  Extremities: Symmetric 5 x 5 power. Skin: No rashes, lesions or ulcers Psychiatry: Judgement and insight appear normal. Mood & affect appropriate.   Data Reviewed: I have personally  reviewed following labs and imaging studies  CBC:  Recent Labs Lab 01/04/16 1255 01/04/16 1314 01/05/16 0244  WBC 9.3  --  7.5  NEUTROABS 5.5  --   --   HGB 15.8 15.6 14.8  HCT 44.3 46.0 42.2  MCV 88.6  --  89.2  PLT 256  --  [id number]   Basic Metabolic Panel:  Recent Labs Lab 01/04/16 1255 01/04/16 1314 01/05/16 0244  NA 138 138 139  K 3.5 3.8 3.0*  CL 105 102 103  CO2 20*  --  25  GLUCOSE 182* 179* 180*  BUN 9 13 10   CREATININE 1.04 0.90 1.01  CALCIUM 9.8  --  8.9   GFR: Estimated Creatinine Clearance: 87.3 mL/min (by C-G formula based on SCr of 1.01 mg/dL). Liver Function Tests:  Recent Labs Lab 01/04/16 1255  AST 21  ALT 26  ALKPHOS 61  BILITOT 0.8  PROT 7.7  ALBUMIN 4.6   No results for input(s): LIPASE, AMYLASE in the last 168 hours. No  results for input(s): AMMONIA in the last 168 hours. Coagulation Profile:  Recent Labs Lab 01/04/16 1255  INR 1.04   Cardiac Enzymes: No results for input(s): CKTOTAL, CKMB, CKMBINDEX, TROPONINI in the last 168 hours. BNP (last 3 results) No results for input(s): PROBNP in the last 8760 hours. HbA1C: No results for input(s): HGBA1C in the last 72 hours. CBG:  Recent Labs Lab 01/04/16 1748 01/04/16 2228 01/05/16 0632 01/05/16 1107 01/05/16 1647  GLUCAP 251* 154* 195* 280* 106*   Lipid Profile:  Recent Labs  01/05/16 0244  CHOL 92  HDL 30*  LDLCALC 27  TRIG 174*  CHOLHDL 3.1   Thyroid Function Tests: No results for input(s): TSH, T4TOTAL, FREET4, T3FREE, THYROIDAB in the last 72 hours. Anemia Panel: No results for input(s): VITAMINB12, FOLATE, FERRITIN, TIBC, IRON, RETICCTPCT in the last 72 hours. Urine analysis:    Component Value Date/Time   COLORURINE LT. YELLOW 11/08/2012 0805   APPEARANCEUR CLEAR 11/08/2012 0805   LABSPEC 1.020 11/08/2012 0805   PHURINE 6.0 11/08/2012 0805   GLUCOSEU NEGATIVE 11/08/2012 0805   HGBUR SMALL 11/08/2012 0805   BILIRUBINUR NEGATIVE 11/08/2012 0805   KETONESUR NEGATIVE 11/08/2012 0805   UROBILINOGEN 1.0 11/08/2012 0805   NITRITE NEGATIVE 11/08/2012 0805   LEUKOCYTESUR TRACE 11/08/2012 0805   Sepsis Labs: @LABRCNTIP (procalcitonin:4,lacticidven:4)   )No results found for this or any previous visit (from the past 240 hour(s)).    Radiology Studies: Ct Head Wo Contrast Result Date: 01/04/2016  1.  No acute intracranial abnormality. 2. Mild small vessel ischemic change.   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 Small acute left frontal subcortical white matter infarction. These 2 acute infarctions could be coincidental in due to ordinary small vessel disease, but emboli from the heart or ascending aorta could also result in  this. Moderate chronic small-vessel ischemic changes of the cerebral hemispheric white matter for a age. Negative intracranial MR angiography of the large and medium size vessels.   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 Small acute left frontal subcortical white matter infarction. These 2 acute infarctions could be coincidental in due to ordinary small vessel disease, but emboli from the heart or ascending aorta could also result in this. Moderate chronic small-vessel ischemic changes of the cerebral hemispheric white matter for a  age. Negative intracranial MR angiography of the large and medium size vessels.     Scheduled Meds: . aspirin  300 mg Rectal Daily   Or  . aspirin  325 mg Oral Daily  . enoxaparin (LOVENOX) injection  40 mg Subcutaneous Q24H  . insulin aspart  0-15 Units Subcutaneous TID WC  . potassium chloride  20 mEq Oral BID   Continuous Infusions:    LOS: 0 days    Time spent: 25 minutes  Greater than 50% of the time spent on counseling and coordinating the care.   Leisa Lenz, MD [facility] Pager [phone]  If 7PM-7AM, please contact night-coverage [email or web address] Password [email or web address] 01/05/2016, 8:13 PM

## 2016-01-05 NOTE — Evaluation (Signed)
Physical Therapy Evaluation Patient Details Name: Stran Becton MRN: [id number] DOB: 1950-10-15 Today's Date: 01/05/2016   History of Present Illness  Admitted with R arm/leg numbness and weakness, difficulty walking, decreased balance. MRI showed 2 acute infarctions.  Clinical Impression  Pt admitted with above diagnosis. Pt currently with functional limitations due to the deficits listed below (see PT Problem List). On eval, pt demo mod I with bed mobility. He required min guard assist for transfers and ambulation 200 feet without A.D. Pt will benefit from skilled PT to increase their independence and safety with mobility to allow discharge to the venue listed below.       Follow Up Recommendations Outpatient PT;Supervision for mobility/OOB    Equipment Recommendations  None recommended by PT    Recommendations for Other Services       Precautions / Restrictions Precautions Precautions: Fall      Mobility  Bed Mobility Overal bed mobility: Modified Independent             General bed mobility comments: +rail, HOB elevated  Transfers Overall transfer level: Needs assistance Equipment used: None Transfers: Sit to/from [facility] Sit to Stand: Min guard Stand pivot transfers: Min guard       General transfer comment: min guard assist for safety only, increased time to stabilize initial standing balance  Ambulation/Gait Ambulation/Gait assistance: Min guard Ambulation Distance (Feet): 200 Feet Assistive device: None (occassional use of hand rail in hallway) Gait Pattern/deviations: Step-through pattern;Decreased weight shift to right Gait velocity: decreased Gait velocity interpretation: Below normal speed for age/gender General Gait Details: mild steppage gait due to decreased proprioception RLE  Stairs            Wheelchair Mobility    Modified Rankin (Stroke Patients Only) Modified Rankin (Stroke Patients Only) Pre-Morbid Rankin  Score: No symptoms Modified Rankin: Moderate disability     Balance Overall balance assessment: Needs assistance Sitting-balance support: No upper extremity supported;Feet supported Sitting balance-Leahy Scale: Good     Standing balance support: No upper extremity supported;During functional activity Standing balance-Leahy Scale: Fair               High level balance activites: Side stepping;Direction changes;Turns;Sudden stops;Head turns High Level Balance Comments: No LOB noted but slower more mindful movements needed to complete.             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 Independence: Independent         Comments: works out at the gym     [place]: Right    Extremity/Trunk Assessment   Upper Extremity Assessment: Defer to OT evaluation           Lower Extremity Assessment: RLE deficits/detail RLE Deficits / Details: 4/5 gross strength, decreased coordination    Cervical / Trunk Assessment: Normal  Communication   Communication: No difficulties  Cognition Arousal/Alertness: Awake/alert Behavior During Therapy: WFL for tasks assessed/performed Overall Cognitive Status: Within Functional Limits for tasks assessed  General Comments      Exercises     Assessment/Plan    PT Assessment Patient needs continued PT services  PT Problem List Decreased strength;Decreased balance;Decreased mobility;Decreased knowledge of  precautions;Decreased safety awareness          PT Treatment Interventions Gait training;Stair training;Functional mobility training;Neuromuscular re-education;Balance training;Therapeutic exercise;Therapeutic activities;Patient/family education    PT Goals (Current goals can be found in the Care Plan section)  Acute Rehab PT Goals Patient Stated Goal: to go home PT Goal Formulation: With patient Time For Goal Achievement: 01/19/16 Potential to Achieve Goals: Good    Frequency Min 4X/week   Barriers to discharge        Co-evaluation               End of Session Equipment Utilized During Treatment: Gait belt Activity Tolerance: Patient tolerated treatment well Patient left: in bed;with bed alarm set;with call bell/phone within reach;with family/visitor present Nurse Communication: Mobility status    Functional Assessment Tool Used: clinical judgement Functional Limitation: Mobility: Walking and moving around Mobility: Walking and Moving Around Current Status [phone]): At least 1 percent but less than 20 percent impaired, limited or restricted Mobility: Walking and Moving Around Goal Status [phone]): At least 1 percent but less than 20 percent impaired, limited or restricted    Time: [id number] PT Time Calculation (min) (ACUTE ONLY): 22 min   Charges:   PT Evaluation $PT Eval Moderate Complexity: 1 Procedure     PT G Codes:   PT G-Codes **NOT FOR INPATIENT CLASS** Functional Assessment Tool Used: clinical judgement Functional Limitation: Mobility: Walking and moving around Mobility: Walking and Moving Around Current Status [id number]): At least 1 percent but less than 20 percent impaired, limited or restricted Mobility: Walking and Moving Around Goal Status [phone]): At least 1 percent but less than 20 percent impaired, limited or restricted    Lorriane Shire 01/05/2016, 4:10 PM

## 2016-01-06 ENCOUNTER — Inpatient Hospital Stay (HOSPITAL_COMMUNITY): Payer: Medicare Other

## 2016-01-06 DIAGNOSIS — I6789 Other cerebrovascular disease: Secondary | ICD-10-CM

## 2016-01-06 LAB — GLUCOSE, CAPILLARY
GLUCOSE-CAPILLARY: 182 mg/dL — AB (ref 65–99)
GLUCOSE-CAPILLARY: 320 mg/dL — AB (ref 65–99)

## 2016-01-06 LAB — BASIC METABOLIC PANEL
ANION GAP: 10 (ref 5–15)
BUN: 11 mg/dL (ref 6–20)
CO2: 25 mmol/L (ref 22–32)
Calcium: 8.8 mg/dL — ABNORMAL LOW (ref 8.9–10.3)
Chloride: 104 mmol/L (ref 101–111)
Creatinine, Ser: 0.96 mg/dL (ref 0.61–1.24)
GLUCOSE: 159 mg/dL — AB (ref 65–99)
POTASSIUM: 3.3 mmol/L — AB (ref 3.5–5.1)
Sodium: 139 mmol/L (ref 135–145)

## 2016-01-06 LAB — ECHOCARDIOGRAM COMPLETE
HEIGHTINCHES: 70 in
WEIGHTICAEL: 3600 [oz_av]

## 2016-01-06 LAB — HEMOGLOBIN A1C
HEMOGLOBIN A1C: 9.1 % — AB (ref 4.8–5.6)
Mean Plasma Glucose: 214 mg/dL

## 2016-01-06 MED ORDER — ASPIRIN 325 MG PO TABS: 325.0000 mg | ORAL_TABLET | Freq: Every day | ORAL | 0 refills | Status: DC

## 2016-01-06 MED ORDER — POTASSIUM CHLORIDE CRYS ER 20 MEQ PO TBCR
40.0000 meq | EXTENDED_RELEASE_TABLET | Freq: Once | ORAL | Status: AC
  Administered 2016-01-06: 40 meq via ORAL
  Filled 2016-01-06: qty 2

## 2016-01-06 MED ORDER — ATORVASTATIN CALCIUM 40 MG PO TABS: 40.0000 mg | ORAL_TABLET | Freq: Every day | ORAL | 0 refills | Status: DC

## 2016-01-06 NOTE — Progress Notes (Signed)
STROKE TEAM PROGRESS NOTE   SUBJECTIVE (INTERVAL HISTORY) His RN is at the bedside.  No acute events overnight. He still has mild right hemiparesis. PT/OT recommended outpatient PT/OT.    OBJECTIVE Temp:  [97.5 F (36.4 C)-98.4 F (36.9 C)] 97.7 F (36.5 C) (10/15 1002) Pulse Rate:  [49-63] 63 (10/15 1002) Cardiac Rhythm: Sinus bradycardia (10/15 0734) Resp:  [16-20] 18 (10/15 1002) BP: (134-158)/(82-93) 143/93 (10/15 1002) SpO2:  [95 %-99 %] 99 % (10/15 1002)  CBC:   Recent Labs Lab 01/04/16 1255 01/04/16 1314 01/05/16 0244  WBC 9.3  --  7.5  NEUTROABS 5.5  --   --   HGB 15.8 15.6 14.8  HCT 44.3 46.0 42.2  MCV 88.6  --  89.2  PLT 256  --  [id number]    Basic Metabolic Panel:   Recent Labs Lab 01/05/16 0244 01/06/16 0213  NA 139 139  K 3.0* 3.3*  CL 103 104  CO2 25 25  GLUCOSE 180* 159*  BUN 10 11  CREATININE 1.01 0.96  CALCIUM 8.9 8.8*    Lipid Panel:     Component Value Date/Time   CHOL 92 01/05/2016 0244   TRIG 174 (H) 01/05/2016 0244   HDL 30 (L) 01/05/2016 0244   CHOLHDL 3.1 01/05/2016 0244   VLDL 35 01/05/2016 0244   LDLCALC 27 01/05/2016 0244   HgbA1c:  Lab Results  Component Value Date   HGBA1C 7.7 (H) 02/12/2015   Urine Drug Screen:     Component Value Date/Time   LABOPIA NONE DETECTED 01/05/2016 0823   COCAINSCRNUR NONE DETECTED 01/05/2016 0823   LABBENZ NONE DETECTED 01/05/2016 0823   AMPHETMU NONE DETECTED 01/05/2016 0823   THCU NONE DETECTED 01/05/2016 0823   LABBARB NONE DETECTED 01/05/2016 0823      IMAGING I have personally reviewed the radiological images below and agree with the radiology interpretations.  Ct Head Wo Contrast 01/04/2016 1.  No acute intracranial abnormality.  2. Mild small vessel ischemic change.   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  Small acute left frontal subcortical white matter infarction.  These 2 acute  infarctions could be coincidental in due to ordinary small vessel disease, but emboli from the heart or ascending aorta could also result in this.  Moderate chronic small-vessel ischemic changes of the cerebral hemispheric white matter for a age.  Negative intracranial MR angiography of the large and medium size vessels.   CUS  01/05/2016 Preliminary report:  1-39% ICA plaquing. Vertebral artery flow is antegrade.   TTE pending   PHYSICAL EXAM  Temp:  [97.5 F (36.4 C)-98.4 F (36.9 C)] 97.7 F (36.5 C) (10/15 1002) Pulse Rate:  [49-63] 63 (10/15 1002) Resp:  [16-20] 18 (10/15 1002) BP: (134-158)/(82-93) 143/93 (10/15 1002) SpO2:  [95 %-99 %] 99 % (10/15 1002)  General - Well nourished, well developed, in no apparent distress.  Ophthalmologic - Sharp disc margins OU.   Cardiovascular - Regular rate and rhythm.  Mental Status -  Level of arousal and orientation to time, place, and person were intact. Language including expression, naming, repetition, comprehension was assessed and found intact. Fund of Knowledge was assessed and was intact.  Cranial Nerves II - XII - II - Visual field intact OU. III, IV, VI - Extraocular movements intact. V - Facial sensation intact bilaterally. VII - mild right nasolabial fold flattening. VIII - Hearing & vestibular intact bilaterally. X -  Palate elevates symmetrically. XI - Chin turning & shoulder shrug intact bilaterally. XII - Tongue protrusion intact.  Motor Strength - The patient's strength was normal in all extremities except right pronator drift and right hand mild dexterity difficulties.  Bulk was normal and fasciculations were absent.   Motor Tone - Muscle tone was assessed at the neck and appendages and was normal.  Reflexes - The patient's reflexes were 1+ in all extremities and he had no pathological reflexes.  Sensory - Light touch, temperature/pinprick were assessed and were symmetrical.    Coordination - The patient  had normal movements in the hands and feet with no ataxia or dysmetria.  Tremor was absent.  Gait and Station - deferred.    ASSESSMENT/PLAN Mr. Eivan Stepper is a 65 y.o. male with history of hypertension and diabetes mellitus presenting with right hemiparesis. He did not receive IV t-PA due to late presentation.  Strokes: Left paramedian pontine and left frontal small infarcts possibly synchronized small vessel disease. However, cardioembolic cannot be completely excluded.  Resultant  mild right facial droop and right pronator drift  MRI - 2 small acute infarcts - ventral pontomedullary junction and left frontal subcortical white matter.  MRA - negative  Carotid Doppler unremarkable  2D Echo pending  Recommend 30 day cardiac event monitoring as outpatient to rule out A. fib  LDL - 27  HgbA1c pending  UDS negative  VTE prophylaxis - Lovenox Diet heart healthy/carb modified Room service appropriate? Yes; Fluid consistency: Thin  No antithrombotic prior to admission, now on aspirin 325 mg daily. Continue aspirin on discharge  Patient counseled to be compliant with his antithrombotic medications  Ongoing aggressive stroke risk factor management  Therapy recommendations: Outpatient PT and OT   Disposition: Pending  Hypertension  Stable  Permissive hypertension (OK if < 220/120) but gradually normalize in 5-7 days  Long-term BP goal normotensive  ? Hyperlipidemia  Home meds:  Lipitor 10 mg daily prior to admission  LDL 27, goal < 70  Due to low LDL, recommend off Lipitor for observation.  Follow-up with PCP to repeat LDL  Diabetes  HgbA1c pending, goal < 7.0  Uncontrolled  SSI  CBG monitoring  Fluctuating glucose levels, hyperglycemia  Other Stroke Risk Factors  Advanced age  Former cigarette smoker - the patient no longer smokes.  Occasional ETOH use, advised to drink no more than 1 - 2 drink(s) a day  Obesity, Body mass index is 32.28  kg/m., recommend weight loss, diet and exercise as appropriate   Other Active Problems  Bradycardia - 40s to 50s  Hypokalemia - 3.0 -> supplement -> 3.3  Hospital day # 1  Neurology will sign off. Please call with questions. Pt will follow up with carolyn Hassell Done NP at [facility] in about 6 weeks. Thanks for the consult.  Rosalin Hawking, MD PhD Stroke Neurology 01/06/2016 1:31 PM    To contact Stroke Continuity provider, please refer to [email or web address] After hours, contact General Neurology

## 2016-01-06 NOTE — Discharge Summary (Signed)
Physician Discharge Summary  Shamaar Kempson [id number] DOB: [birth year] DOA: 01/04/2016  PCP: Walker Kehr, MD  Admit date: 01/04/2016 Discharge date: 01/06/2016  Recommendations for Outpatient Follow-up:  Continue aspirin 325 mg daily and Lipitor 40 mg at bedtime  Discharge Diagnoses:  Principal Problem:   Right sided weakness Active Problems:   Acute CVA (cerebrovascular accident) ([place])   Type 2 diabetes mellitus with vascular disease ([place])   Dyslipidemia associated with type 2 diabetes mellitus ([place])   Hypokalemia    Discharge Condition: stable   Diet recommendation: as tolerated   History of present illness:  65 y.o.malewith past medical history of diabetes, hypertension who presented to [facility] for evaluation of right arm numbness and tingling and right foot drop started 2-3 days prior to this admission. 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   Hospital Course:    Assessment & Plan:   Principal Problem:   Acute CVA (cerebral vascular accident) ([place]) - Aspirin daily - MRI brain / MRA brain - acute sub cm infarction at the ventral pontomedullary junction just to the left of midline and small acute left frontal subcortical white matter infarction. These 2 acute infarctions could be coincidental in due to ordinary small vessel disease, but emboli from the heart or ascending aorta could also result in this. - 2D ECHO - EF 55% - HgbA1c pending - LDL 27; resume atorvastatin at higher dose 40 mg instead of 20 mg  - Diet: regular  - Therapy: PT/OT - outpt, referral provided   Active Problems:   Type 2 diabetes mellitus with vascular disease without long term insulin use (HCC) - A1c can be followed on oupt basis  - On metformin at home - In hospital on SSI    Dyslipidemia associated with type 2 DM - Resume statin  therapy at 40 mg dose instead of 20 mg a day     Hypokalemia - Supplemented prior to discharge     DVT prophylaxis: Lovenox subQ Code Status: full code  Family Communication: no family at the bedside     Consultants:   Neurology   Procedures:   None   Antimicrobials:   None    Signed:  Leisa Lenz, MD  [facility] 01/06/2016, 3:09 PM  Pager #: [phone]  Time spent in minutes: less than 30 minutes    Discharge Exam: Vitals:   01/06/16 1002 01/06/16 1420  BP: (!) 143/93 (!) 147/76  Pulse: 63 (!) 51  Resp: 18 18  Temp: 97.7 F (36.5 C) 98.2 F (36.8 C)   Vitals:   01/06/16 0128 01/06/16 0541 01/06/16 1002 01/06/16 1420  BP: 135/83 (!) 158/82 (!) 143/93 (!) 147/76  Pulse: (!) 49 (!) 52 63 (!) 51  Resp: 16 18 18 18   Temp: 98 F (36.7 C) 97.5 F (36.4 C) 97.7 F (36.5 C) 98.2 F (36.8 C)  TempSrc: Oral Tympanic Oral Oral  SpO2: 98% 98% 99% 98%  Weight:      Height:        General: Pt is alert, follows commands appropriately, not in acute distress Cardiovascular: Regular rate and rhythm, S1/S2 + Respiratory: Clear to auscultation bilaterally, no wheezing, no crackles, no rhonchi Abdominal: Soft, non tender, non distended, bowel sounds +, no guarding Extremities: no edema, no cyanosis, pulses palpable bilaterally DP and PT Neuro: right sided weakness (UE better than LE)  Discharge  Instructions  Discharge Instructions    Ambulatory referral to Neurology    Complete by:  As directed    Follow up with NP Cecille Rubin at [facility] in about 2 months. Thanks.   Call MD for:  persistant nausea and vomiting    Complete by:  As directed    Call MD for:  severe uncontrolled pain    Complete by:  As directed    Call MD for:  temperature >100.4    Complete by:  As directed    Diet - low sodium heart healthy    Complete by:  As directed    Discharge instructions    Complete by:  As directed    Continue aspirin 325 mg daily and Lipitor  40 mg at bedtime   Increase activity slowly    Complete by:  As directed        Medication List    STOP taking these medications   Alogliptin-Pioglitazone 25-30 MG Tabs   Testosterone Micronized Crys     TAKE these medications   aspirin 325 MG tablet Take 1 tablet (325 mg total) by mouth daily. Start taking on:  01/07/2016   atorvastatin 40 MG tablet Commonly known as:  LIPITOR Take 1 tablet (40 mg total) by mouth daily at 6 PM. What changed:  medication strength  how much to take  when to take this   Cholecalciferol 1000 units tablet Take 1,000 Units by mouth daily.   hydrocortisone 25 MG suppository Commonly known as:  ANUSOL-HC Place 1 suppository (25 mg total) rectally 2 (two) times daily.   metFORMIN 1000 MG tablet Commonly known as:  GLUCOPHAGE Take 1 tablet (1,000 mg total) by mouth 2 (two) times daily with a meal.   Probiotic Caps Take 1 capsule by mouth daily.   quinapril-hydrochlorothiazide 20-12.5 MG tablet Commonly known as:  ACCURETIC Take 2 tablets by mouth daily.   sildenafil 100 MG tablet Commonly known as:  VIAGRA Take 1 tablet (100 mg total) by mouth daily as needed.   triamcinolone ointment 0.5 % Commonly known as:  KENALOG Apply 1 application topically 2 (two) times daily.   zolpidem 10 MG tablet Commonly known as:  AMBIEN Take 0.5 tablets (5 mg total) by mouth at bedtime as needed.      Follow-up Information    Dennie Bible, NP. Schedule an appointment as soon as possible for a visit in 6 week(s).   Specialty:  Family Medicine Contact information: [address] [place] [address] [phone]        Walker Kehr, MD. Schedule an appointment as soon as possible for a visit in 1 week(s).   Specialty:  Internal Medicine Contact information: [place] [address] [phone]            The results of significant diagnostics from this hospitalization (including imaging,  microbiology, ancillary and laboratory) are listed below for reference.    Significant Diagnostic Studies: Ct Head Wo Contrast  Result Date: 01/04/2016 CLINICAL DATA:  Right upper extremity numbness and unsteady gait. History of hypertension. Diabetes. EXAM: CT HEAD WITHOUT CONTRAST TECHNIQUE: Contiguous axial images were obtained from the base of the skull through the vertex without intravenous contrast. COMPARISON:  None. FINDINGS: Brain: Mild low density in the periventricular white matter likely related to small vessel disease. No mass lesion, hemorrhage, hydrocephalus, acute infarct, intra-axial, or extra-axial fluid collection. Vascular: No hyperdense vessel or unexpected calcification. Skull: Normal Sinuses/Orbits: Normal orbits and globes. Right ethmoid  air cell mucous retention cyst or polyp. Clear mastoid air cells. Other: None IMPRESSION: 1.  No acute intracranial abnormality. 2. Mild small vessel ischemic change. Electronically Signed   By: Abigail Miyamoto M.D.   On: 01/04/2016 13:12   Mr Brain Wo Contrast  Result Date: 01/04/2016 CLINICAL DATA:  Acute onset of numbness of the right hand and fingers while exercising. EXAM: MRI HEAD WITHOUT CONTRAST MRA HEAD WITHOUT CONTRAST TECHNIQUE: Multiplanar, multiecho pulse sequences of the brain and surrounding structures were obtained without intravenous contrast. Angiographic images of the head were obtained using MRA technique without contrast. COMPARISON:  CT same day FINDINGS: MRI HEAD FINDINGS Brain: Diffusion imaging shows a sub cm acute infarction at the pontomedullary junction anteriorly just to the left of midline. There is also a small linear white matter acute infarction in the left frontal subcortical white matter. There are mild chronic small-vessel changes of the pons. There are moderate chronic small-vessel ischemic changes of the cerebral hemispheric white matter. No large vessel territory infarction. No mass lesion, hemorrhage,  hydrocephalus or extra-axial collection. Vascular: Major vessels at the base of the brain show flow. Skull and upper cervical spine: Negative Sinuses/Orbits: Clear Other: None significant MRA HEAD FINDINGS Both internal carotid arteries are widely patent into the brain. The anterior and middle cerebral vessels are normal without proximal stenosis, aneurysm or vascular malformation. Fetal origin of the left PCA. Large posterior communicating artery on the right. Both vertebral arteries are patent. The right terminates in PICA. Left vertebral artery supplies the basilar. No basilar stenosis. Posterior circulation branch vessels are patent and normal. 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 Small acute left frontal subcortical white matter infarction. These 2 acute infarctions could be coincidental in due to ordinary small vessel disease, but emboli from the heart or ascending aorta could also result in this. Moderate chronic small-vessel ischemic changes of the cerebral hemispheric white matter for a age. Negative intracranial MR angiography of the large and medium size vessels. Electronically Signed   By: [place] Chimes M.D.   On: 01/04/2016 17:41   Mr Jodene Nam Head/brain [id number] Cm  Result Date: 01/04/2016 CLINICAL DATA:  Acute onset of numbness of the right hand and fingers while exercising. EXAM: MRI HEAD WITHOUT CONTRAST MRA HEAD WITHOUT CONTRAST TECHNIQUE: Multiplanar, multiecho pulse sequences of the brain and surrounding structures were obtained without intravenous contrast. Angiographic images of the head were obtained using MRA technique without contrast. COMPARISON:  CT same day FINDINGS: MRI HEAD FINDINGS Brain: Diffusion imaging shows a sub cm acute infarction at the pontomedullary junction anteriorly just to the left of midline. There is also a small linear white matter acute infarction in the left frontal subcortical white matter. There are mild  chronic small-vessel changes of the pons. There are moderate chronic small-vessel ischemic changes of the cerebral hemispheric white matter. No large vessel territory infarction. No mass lesion, hemorrhage, hydrocephalus or extra-axial collection. Vascular: Major vessels at the base of the brain show flow. Skull and upper cervical spine: Negative Sinuses/Orbits: Clear Other: None significant MRA HEAD FINDINGS Both internal carotid arteries are widely patent into the brain. The anterior and middle cerebral vessels are normal without proximal stenosis, aneurysm or vascular malformation. Fetal origin of the left PCA. Large posterior communicating artery on the right. Both vertebral arteries are patent. The right terminates in PICA. Left vertebral artery supplies the basilar. No basilar stenosis. Posterior circulation branch vessels are patent and normal. IMPRESSION:  Acute sub cm infarction at the ventral pontomedullary junction just to the left of midline. Small acute left frontal subcortical white matter infarction. These 2 acute infarctions could be coincidental in due to ordinary small vessel disease, but emboli from the heart or ascending aorta could also result in this. Moderate chronic small-vessel ischemic changes of the cerebral hemispheric white matter for a age. Negative intracranial MR angiography of the large and medium size vessels. Electronically Signed   By: [place] Chimes M.D.   On: 01/04/2016 17:41    Microbiology: No results found for this or any previous visit (from the past 240 hour(s)).   Labs: Basic Metabolic Panel:  Recent Labs Lab 01/04/16 1255 01/04/16 1314 01/05/16 0244 01/06/16 0213  NA 138 138 139 139  K 3.5 3.8 3.0* 3.3*  CL 105 102 103 104  CO2 20*  --  25 25  GLUCOSE 182* 179* 180* 159*  BUN 9 13 10 11   CREATININE 1.04 0.90 1.01 0.96  CALCIUM 9.8  --  8.9 8.8*   Liver Function Tests:  Recent Labs Lab 01/04/16 1255  AST 21  ALT 26  ALKPHOS 61  BILITOT 0.8   PROT 7.7  ALBUMIN 4.6   No results for input(s): LIPASE, AMYLASE in the last 168 hours. No results for input(s): AMMONIA in the last 168 hours. CBC:  Recent Labs Lab 01/04/16 1255 01/04/16 1314 01/05/16 0244  WBC 9.3  --  7.5  NEUTROABS 5.5  --   --   HGB 15.8 15.6 14.8  HCT 44.3 46.0 42.2  MCV 88.6  --  89.2  PLT 256  --  251   Cardiac Enzymes: No results for input(s): CKTOTAL, CKMB, CKMBINDEX, TROPONINI in the last 168 hours. BNP: BNP (last 3 results) No results for input(s): BNP in the last 8760 hours.  ProBNP (last 3 results) No results for input(s): PROBNP in the last 8760 hours.  CBG:  Recent Labs Lab 01/05/16 1107 01/05/16 1647 01/05/16 2113 01/06/16 0650 01/06/16 1127  GLUCAP 280* 106* 177* 182* 320*

## 2016-01-06 NOTE — Progress Notes (Signed)
CM met with pt in room and gave pt [place] information (also on AVS) and explained the [facility] will call pt to schedule an outpt PT appointment.  CM e-faxed H&P, Neuro note, and DC summary to [facility].  No other CM needs were communicated.

## 2016-01-06 NOTE — Discharge Instructions (Signed)
Ischemic Stroke Treated Without Warfarin °An ischemic stroke (cerebrovascular accident) is the sudden death of brain tissue. It is a medical emergency. An ischemic stroke can cause permanent loss of brain function. This can cause problems with different parts of your body. °CAUSES °An ischemic stroke is caused by a decrease of oxygen supply to an area of your brain. It is usually the result of a small blood clot (embolus) or collection of cholesterol or fat (plaque) that blocks blood flow in the brain. An ischemic stroke can also be caused by blocked or damaged carotid arteries. °RISK FACTORS °· High blood pressure (hypertension). °· High cholesterol. °· Diabetes mellitus. °· Heart disease. °· The buildup of plaque in the blood vessels (peripheral artery disease or atherosclerosis). °· The buildup of plaque in the blood vessels that provide blood and oxygen to the brain (carotid artery stenosis). °· An abnormal heart rhythm (atrial fibrillation). °· Obesity. °· Smoking cigarettes. °· Taking oral contraceptives, especially in combination with using tobacco. °· Physical inactivity. °· A diet that is high in fats, salt (sodium), and calories. °· Excessive alcohol use. °· Use of illegal drugs, especially cocaine and methamphetamine. °· Being African American. °· Being over the age of 55 years. °· Family history of stroke. °· Previous history of blood clots, stroke, TIA (transient ischemic attack), or heart attack. °· Sickle cell disease. °SIGNS AND SYMPTOMS °These symptoms usually develop suddenly, or you may notice them after waking up from sleep. Symptoms may include sudden: °· Weakness or numbness in your face, arm, or leg, especially on one side of your body. °· Confusion. °· Trouble speaking (aphasia) or understanding speech. °· Trouble seeing with one or both eyes. °· Trouble walking or difficulty moving your arms or legs. °· Dizziness. °· Loss of balance or coordination. °· Severe headache with no known cause.  The headache is often described as the worst headache ever experienced. °DIAGNOSIS °Your health care provider can often determine the presence or absence of an ischemic stroke based on your symptoms, history, and physical exam. CT (computed tomography) of the brain is usually performed to confirm the stroke, determine causes, and determine stroke severity. Other tests may be done to find the cause of the stroke. These tests may include: °· ECG (electrocardiogram). °· Continuous heart monitoring. °· Echocardiogram. °· Carotid ultrasound. °· MRI. °· A scan of the brain circulation. °· Blood tests. °TREATMENT °It is very important to seek treatment at the first sign of stroke symptoms. Your health care provider may perform the following treatments within 6 hours of the onset of stroke symptoms: °· Medicine to dissolve the blood clot (thrombolytic). °· Inserting a device into the affected artery to remove the blood clot. °These treatments may not be effective if too much time has passed since your stroke symptoms began. Even if you do not know when your symptoms began, get treatment as soon as possible. There are other treatment options that may be given, such as: °· Oxygen. °· IV fluids. °· Medicines to thin the blood (anticoagulants). °· A procedure to widen blocked arteries. °Your treatment will depend on how long you have had your symptoms, the severity of your symptoms, and the cause of your symptoms. °Your health care provider will take measures to prevent short-term and long-term complications of stroke, such as: °· Breathing foreign material into the lungs (aspiration pneumonia). °· Blood clots in the legs. °· Bedsores. °· Falls. °Medicines and dietary changes may be used to help treat and manage risk factors for   stroke, such as diabetes and high blood pressure. °If any of your body's functions were impaired by stroke, you may work with physical, speech, or occupational therapists to help you recover. °HOME CARE  INSTRUCTIONS °· Take medicines only as directed by your health care provider. Follow the directions carefully. Medicines may be used to control risk factors for a stroke. Be sure that you understand all your medicine instructions. °· If swallow studies have determined that your swallowing reflex is present, you should eat healthy foods. Foods may need to be a soft or pureed consistency, or you may need to take small bites in order to avoid aspirating or choking. °· Follow physical activity guidelines as directed by your health care team. °· Do not use any tobacco products, including cigarettes, chewing tobacco, or electronic cigarettes. If you smoke, quit. If you need help quitting, ask your health care provider. °· Limit or stop alcohol use. °· A safe home environment is important to reduce the risk of falls. Your health care provider may arrange for specialists to evaluate your home. Having grab bars in the bedroom and bathroom is often important. Your health care provider may arrange for equipment to be used at home, such as raised toilets and a seat for the shower. °· Ongoing physical, occupational, and speech therapy may be needed to maximize your recovery after a stroke. If you have been advised to use a walker or a cane, use it at all times. Be sure to keep your therapy appointments. °· Keep all follow-up visits with your health care provider. This is very important. This includes any referrals, therapy, rehabilitation, and lab tests. Proper follow-up can prevent another stroke from occurring. °PREVENTION °The risk of a stroke can be decreased by appropriately treating high blood pressure, high cholesterol, diabetes, heart disease, and obesity. It can also be decreased by quitting smoking, limiting alcohol, and staying physically active. °SEEK IMMEDIATE MEDICAL CARE IF: °· You have sudden weakness or numbness in your face, arm, or leg, especially on one side of your body. °· You have sudden confusion. °· You  have sudden trouble speaking (aphasia) or understanding. °· You have sudden trouble seeing with one or both eyes. °· You have sudden trouble walking or difficulty moving your arms or legs. °· You have sudden dizziness. °· You have a sudden loss of balance or coordination. °· You have a sudden, severe headache with no known cause. °· You have a partial or total loss of consciousness. °Any of these symptoms may represent a serious problem that is an emergency. Do not wait to see if the symptoms will go away. Get medical help right away. Call your local emergency services (911 in [place] Do not drive yourself to the hospital. °  °This information is not intended to replace advice given to you by your health care provider. Make sure you discuss any questions you have with your health care provider. °  °Document Released: 12/23/2013 Document Reviewed: 12/23/2013 °Elsevier Interactive Patient Education ©2016 [employer] ° °

## 2016-01-06 NOTE — Progress Notes (Signed)
Physical Therapy Treatment Patient Details Name: Christian Clements MRN: [id number] DOB: 12/21/1950 Today's Date: 01/06/2016    History of Present Illness Admitted with R arm/leg numbness and weakness, difficulty walking, decreased balance. MRI showed 2 acute infarctions.    PT Comments    Pt completed stair training. Continue per POC.  Follow Up Recommendations  Outpatient PT;Supervision for mobility/OOB     Equipment Recommendations  None recommended by PT    Recommendations for Other Services       Precautions / Restrictions Precautions Precautions: Fall Restrictions Weight Bearing Restrictions: No    Mobility  Bed Mobility Overal bed mobility: Modified Independent                Transfers   Equipment used: None   Sit to Stand: Modified independent (Device/Increase time) Stand pivot transfers: Modified independent (Device/Increase time)       General transfer comment: Pt demo safe technique.  Ambulation/Gait Ambulation/Gait assistance: Min guard Ambulation Distance (Feet): 250 Feet Assistive device: None Gait Pattern/deviations: Step-through pattern;Decreased weight shift to right Gait velocity: decreased Gait velocity interpretation: Below normal speed for age/gender General Gait Details: mild steppage gait due to decreased proprioception RLE   Stairs Stairs: Yes Stairs assistance: Min guard Stair Management: One rail Right;Alternating pattern;Step to pattern;Forwards Number of Stairs: 12 General stair comments: alternating pattern with ascent and step to pattern with descent. Recommended pt have wife in front of him for safety at home during descent.  Wheelchair Mobility    Modified Rankin (Stroke Patients Only) Modified Rankin (Stroke Patients Only) Pre-Morbid Rankin Score: No symptoms Modified Rankin: Moderate disability     Balance   Sitting-balance support: No upper extremity supported;Feet supported Sitting balance-Leahy Scale:  Good     Standing balance support: No upper extremity supported;During functional activity Standing balance-Leahy Scale: Fair                      Cognition Arousal/Alertness: Awake/alert Behavior During Therapy: WFL for tasks assessed/performed Overall Cognitive Status: Impaired/Different from baseline Area of Impairment: Problem solving;Safety/judgement         Safety/Judgement: Decreased awareness of safety   Problem Solving: Difficulty sequencing General Comments: mild deficits in higher level cognitive skills    Exercises Other Exercises Other Exercises: provided theraputty exercises and fine motor exercises sheets with yellow theraputty.  reviewed all exercises and had pt. return demo.  encouraged completion multiple times a day.    General Comments        Pertinent Vitals/Pain Pain Assessment: No/denies pain    Home Living                      Prior Function            PT Goals (current goals can now be found in the care plan section) Acute Rehab PT Goals Patient Stated Goal: to go home PT Goal Formulation: With patient Time For Goal Achievement: 01/19/16 Potential to Achieve Goals: Good Progress towards PT goals: Progressing toward goals    Frequency    Min 4X/week      PT Plan Current plan remains appropriate    Co-evaluation             End of Session Equipment Utilized During Treatment: Gait belt Activity Tolerance: Patient tolerated treatment well Patient left: in chair;with call bell/phone within reach     Time: 1031-1046 PT Time Calculation (min) (ACUTE ONLY): 15 min  Charges:  $Gait Training: 8-22 mins  G Codes:      Christian Clements 01/06/2016, 11:38 AM

## 2016-01-06 NOTE — Progress Notes (Signed)
Echocardiogram 2D Echocardiogram has been performed.  Aggie Cosier 01/06/2016, 12:39 PM

## 2016-01-06 NOTE — Progress Notes (Signed)
Occupational Therapy Treatment Patient Details Name: Fares Mcginity MRN: [id number] DOB: [birth year] Today's Date: 01/06/2016    History of present illness Admitted with R arm/leg numbness and weakness, difficulty walking, decreased balance. MRI showed 2 acute infarctions.   OT comments  Focus of today's session was use of theraputty and fine motor exercises for R hand strengthening.  Pt. Able to return demo and is very motivated for participation and hopes to regain previous strength and confidence with R hand use.    Follow Up Recommendations  Outpatient OT    Equipment Recommendations       Recommendations for Other Services      Precautions / Restrictions Restrictions Weight Bearing Restrictions: No       Mobility Bed Mobility                  Transfers                      Balance                                   ADL                                                Vision                     Perception     Praxis      Cognition                             Extremity/Trunk Assessment               Exercises Other Exercises Other Exercises: provided theraputty exercises and fine motor exercises sheets with yellow theraputty.  reviewed all exercises and had pt. return demo.  encouraged completion multiple times a day.   Shoulder Instructions       General Comments      Pertinent Vitals/ Pain          Home Living                                          Prior Functioning/Environment              Frequency  Min 2X/week        Progress Toward Goals  OT Goals(current goals can now be found in the care plan section)  Progress towards OT goals: Progressing toward goals     Plan Discharge plan remains appropriate    Co-evaluation                 End of Session Equipment Utilized During Treatment: Other (comment) (yellow theraputty  provided)   Activity Tolerance Patient tolerated treatment well   Patient Left     Nurse Communication          Time: [id number] OT Time Calculation (min): 17 min  Charges: OT General Charges $OT Visit: 1 Procedure OT Treatments $Therapeutic Exercise: 8-22 mins  Janice Coffin, COTA/L 01/06/2016, 9:13 AM

## 2016-01-07 LAB — VAS US CAROTID
LCCADSYS: -67 cm/s
LCCAPDIAS: 24 cm/s
LEFT ECA DIAS: -16 cm/s
LEFT VERTEBRAL DIAS: -9 cm/s
Left CCA dist dias: -19 cm/s
Left CCA prox sys: 96 cm/s
Left ICA dist dias: -18 cm/s
Left ICA dist sys: -41 cm/s
Left ICA prox dias: -13 cm/s
Left ICA prox sys: -38 cm/s
RCCADSYS: -51 cm/s
RCCAPDIAS: -24 cm/s
RCCAPSYS: -145 cm/s
RIGHT ECA DIAS: -7 cm/s
RIGHT VERTEBRAL DIAS: -6 cm/s

## 2016-01-08 ENCOUNTER — Other Ambulatory Visit: Payer: Self-pay

## 2016-01-08 ENCOUNTER — Ambulatory Visit: Payer: Medicare Other | Admitting: *Deleted

## 2016-01-08 DIAGNOSIS — I639 Cerebral infarction, unspecified: Secondary | ICD-10-CM

## 2016-01-09 ENCOUNTER — Ambulatory Visit: Payer: Medicare Other | Attending: Neurology | Admitting: *Deleted

## 2016-01-09 ENCOUNTER — Other Ambulatory Visit: Payer: Self-pay | Admitting: Neurology

## 2016-01-09 ENCOUNTER — Encounter: Payer: Self-pay | Admitting: *Deleted

## 2016-01-09 DIAGNOSIS — R2689 Other abnormalities of gait and mobility: Secondary | ICD-10-CM | POA: Diagnosis present

## 2016-01-09 DIAGNOSIS — I69351 Hemiplegia and hemiparesis following cerebral infarction affecting right dominant side: Secondary | ICD-10-CM | POA: Insufficient documentation

## 2016-01-09 DIAGNOSIS — I69318 Other symptoms and signs involving cognitive functions following cerebral infarction: Secondary | ICD-10-CM | POA: Insufficient documentation

## 2016-01-09 DIAGNOSIS — I69851 Hemiplegia and hemiparesis following other cerebrovascular disease affecting right dominant side: Secondary | ICD-10-CM | POA: Insufficient documentation

## 2016-01-09 DIAGNOSIS — I639 Cerebral infarction, unspecified: Secondary | ICD-10-CM

## 2016-01-09 DIAGNOSIS — R2681 Unsteadiness on feet: Secondary | ICD-10-CM | POA: Diagnosis present

## 2016-01-09 DIAGNOSIS — R278 Other lack of coordination: Secondary | ICD-10-CM | POA: Diagnosis present

## 2016-01-09 DIAGNOSIS — M6281 Muscle weakness (generalized): Secondary | ICD-10-CM | POA: Insufficient documentation

## 2016-01-09 NOTE — Therapy (Signed)
[facility] [address] [place], [place], [address] Phone: [phone]   Fax:  [phone]  Occupational Therapy Evaluation  Patient Details  Name: Christian Clements MRN: [id number] Date of Birth: 10-15-1950 Referring Provider: Dr Rosalin Hawking  Encounter Date: 01/09/2016      OT End of Session - 01/09/16 1111    Visit Number 1   Number of Visits 16   Date for OT Re-Evaluation 03/05/16  G Code every 10 visits   [place] [employer]   Authorization - Visit Number 1   Authorization - Number of Visits 10  1/10 G   OT Start Time 1004   OT Stop Time 1054   OT Time Calculation (min) 50 min   Activity Tolerance Patient tolerated treatment well   Behavior During Therapy [facility] for tasks assessed/performed      Past Medical History:  Diagnosis Date  . Diabetes mellitus   . Hypertension   . Prostatitis 2011    History reviewed. No pertinent surgical history.  There were no vitals filed for this visit.      Subjective Assessment - 01/09/16 1007    Subjective  Pt reports that he had numbness in his right hand, arm and leg that began before his admission to the hospital on 01/04/16. He was discharged on 01/06/16.   Patient is accompained by: Family member  Spouse   Currently in Pain? 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   Referring Provider Dr Rosalin Hawking   Onset Date 01/02/16  Symptoms on 10/11, went to ER 01/04/16   Assessment Pt went to hospital on 01/04/16 as symptoms didn't subside and was admitted from 01/04/16-01/06/16. He had PT/OT   Prior Therapy PT/OT in acute hospital setting     Balance Screen   Has the patient fallen in the past 6 months No   Has the  patient had a decrease in activity level because of a fear of falling?  No   Is the patient reluctant to leave their home because of a fear of falling?  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 prior to CVA   Medication Management Is responsible for taking medication in correct dosages at correct time   Financial Management --  Does not do finances - wife does this.     Mobility   Mobility Status --  Slow, stiff, heavy, weighted down R LE   Mobility Status Comments Pt does not have AD at this time     Written Expression   Dominant Hand Right   Handwriting 75% legible     Vision - History   Baseline Vision Wears glasses only for reading     Vision Assessment   Visual Fields --   Comment Cont to assess further in functional context: Pt reports blurred vision from time ot time. When reading or watching TV     Activity Tolerance   Activity Tolerance Comments Pt reports that  he has been less active since CVA, is used to working out at the gym or walking on a daily basis.     Cognition   Overall Cognitive Status Impaired/Different from baseline   Area of Impairment Memory;Problem solving  Cont t oassess in functional context     Observation/Other Assessments   Other Surveys  Select   Quick DASH  65.90     Sensation   Light Touch Impaired Detail   Additional Comments Pt has c/o numbness and tingling that he rates as "Extreme" on Quick DASH, but he has intact light touch given increased time     Coordination   Gross Motor Movements are Fluid and Coordinated No  AROM R UE WFL's but slow and difficult at end range   Fine Motor Movements are Fluid and Coordinated No   9 Hole Peg Test Right;Left   Right 9 Hole Peg Test 1 min 43.00 seconds   Left 9 Hole Peg Test 34.81 seconds   Box and Blocks Right =  33  Left = 46   Coordination Coordination is impaired for all daily activities. Pt uses left hand for some ADL's as using R requires increased time for tasks     ROM / Strength   AROM / PROM / Strength AROM;Strength     AROM   Overall AROM  Deficits   Overall AROM Comments RUE impaired. Overal lAROM WFL's but with c/o tightness at end range for shoulder and difficulty making a fist R, unable to oppose to small finger R.     Strength   Overall Strength Deficits   Strength Assessment Site Hand   Right/Left hand Right   Right Hand Gross Grasp --  See "Hand Funciton" Below     Hand Function   Right Hand Grip (lbs) 55   Right Hand Lateral Pinch 13 lbs   Right Hand 3 Point Pinch 8.5 lbs   Left Hand Grip (lbs) 96   Left Hand Lateral Pinch 21.5 lbs   Left 3 point pinch 20.5 lbs     Sensation Exercises   Stereognosis Appears intact with increased time  Able to identify: key, rubberband, coin, screw w/ increased                          OT Education - 01/09/16 1109    Education provided Yes   Education Details Findings/Recommendations  from OT Assessment; cont with putty and exercises as issued by Acute OT therapists; HEP to be issued next visit   Person(s) Educated Patient;Spouse   Methods Explanation   Comprehension Verbalized understanding  OT Short Term Goals - 01/09/16 1124      OT SHORT TERM GOAL #1   Title Pt will be Mod I HEP for AROM and coordination RUE   Time 4   Period Weeks   Status New     OT SHORT TERM GOAL #2   Title Pt will demonstrate improved grip strength by 5# or more RUE as seen by JAMAR geip assessment   Time 4   Period Weeks   Status New     OT SHORT TERM GOAL #3   Title Pt will be Mod I stating 3 possible signs/symptoms/risk factors of CVA   Time 4   Period Weeks   Status New           OT Long Term Goals - 01/09/16 1126      OT LONG TERM GOAL #1   Title Pt will be Mod I upgraded HEP for RUE   Time 8   Period Weeks   Status New     OT LONG TERM GOAL #2   Title Pt will be Mod I simple meal/snack prep in ADL kitchen while maintaining safety   Time 8   Period Weeks   Status New     OT LONG TERM GOAL #3   Title Pt will demonstrate AROM RUE WNL's w/o c/o joint tightness at end range for shoulder flexion (as compared to LUE)   Time 8   Period Weeks   Status New     OT LONG TERM GOAL #4   Title Pt will demonstrate improved MMT RUE of 4/5 or greater   Baseline MMT grossly 3/5 at eval on 01/09/16   Time 8   Period Weeks   Status New     OT LONG TERM GOAL #5   Title Pt will demonstrate improved grip strength RUE to 60# or greater RUE   Baseline See Eval 01/09/16   Time 8   Period Weeks   Status New     Long Term Additional Goals   Additional Long Term Goals Yes     OT LONG TERM GOAL #6   Title Pt will demonstrate improved 9 Hole Peg test score R UE to less than 1 min for improved coordination & functional hand use   Baseline 01/09/16 1 min 43 seconds   Time 8   Period Weeks   Status New               Plan - 01/09/16 1123    Plan Instruct  in home program for RUE AROM/stretch, coordination & strengthening.      Patient will benefit from skilled therapeutic intervention in order to improve the following deficits and impairments:  Abnormal gait, Decreased coordination, Decreased range of motion, Impaired flexibility, Impaired sensation, Decreased activity tolerance, Cardiopulmonary status limiting activity, Decreased knowledge of precautions, Impaired UE functional use, Decreased knowledge of use of DME, Decreased balance, Decreased mobility, Decreased strength, Impaired vision/preception, Decreased cognition  Visit Diagnosis: Hemiplegia and hemiparesis following other cerebrovascular disease affecting right dominant side ([place]) - Plan: Ot plan of care cert/re-cert  Muscle weakness (generalized) - Plan: Ot plan of care cert/re-cert  Other lack of coordination - Plan: Ot plan of care cert/re-cert  Other symptoms and signs involving cognitive functions following cerebral infarction - Plan: Ot plan of care cert/re-cert      G-Codes - [id number]    Functional Assessment Tool Used Clinical Judgment; Quick DASH   Functional Limitation Carrying, moving and handling objects  Carrying, Moving and Handling Objects Current Status [phone]) At least 27 percent but less than 80 percent impaired, limited or restricted   Carrying, Moving and Handling Objects Goal Status [id number]) At least 20 percent but less than 40 percent impaired, limited or restricted      Problem List Patient Active Problem List   Diagnosis Date Noted  . Acute CVA (cerebrovascular accident) ([place]) 01/05/2016  . Right sided weakness 01/05/2016  . Dyslipidemia associated with type 2 diabetes mellitus ([place]) 01/05/2016  . Hypokalemia 01/05/2016  . Type 2 diabetes mellitus with vascular disease ([place]) 10/21/2006    Jeanetta Alonzo Ardath Sax, OTR/L 01/09/2016, 11:42 AM  [facility] [address] [place], [place], [address] Phone: [phone]   Fax:  [phone]  Name: Christian Clements MRN: [id number] Date of Birth: 1950-12-26

## 2016-01-10 ENCOUNTER — Other Ambulatory Visit (INDEPENDENT_AMBULATORY_CARE_PROVIDER_SITE_OTHER): Payer: Medicare Other

## 2016-01-10 ENCOUNTER — Ambulatory Visit (INDEPENDENT_AMBULATORY_CARE_PROVIDER_SITE_OTHER): Payer: Medicare Other | Admitting: Nurse Practitioner

## 2016-01-10 ENCOUNTER — Encounter: Payer: Self-pay | Admitting: Nurse Practitioner

## 2016-01-10 ENCOUNTER — Ambulatory Visit: Payer: Medicare Other | Admitting: Physical Therapy

## 2016-01-10 ENCOUNTER — Encounter: Payer: Self-pay | Admitting: Physical Therapy

## 2016-01-10 VITALS — BP 132/82 | HR 86 | Temp 97.7°F | Ht 70.0 in | Wt 220.0 lb

## 2016-01-10 DIAGNOSIS — R531 Weakness: Secondary | ICD-10-CM

## 2016-01-10 DIAGNOSIS — R2689 Other abnormalities of gait and mobility: Secondary | ICD-10-CM

## 2016-01-10 DIAGNOSIS — E785 Hyperlipidemia, unspecified: Secondary | ICD-10-CM

## 2016-01-10 DIAGNOSIS — E1159 Type 2 diabetes mellitus with other circulatory complications: Secondary | ICD-10-CM

## 2016-01-10 DIAGNOSIS — I69851 Hemiplegia and hemiparesis following other cerebrovascular disease affecting right dominant side: Secondary | ICD-10-CM | POA: Diagnosis not present

## 2016-01-10 DIAGNOSIS — E876 Hypokalemia: Secondary | ICD-10-CM | POA: Diagnosis not present

## 2016-01-10 DIAGNOSIS — M6281 Muscle weakness (generalized): Secondary | ICD-10-CM

## 2016-01-10 DIAGNOSIS — E1169 Type 2 diabetes mellitus with other specified complication: Secondary | ICD-10-CM

## 2016-01-10 DIAGNOSIS — I69351 Hemiplegia and hemiparesis following cerebral infarction affecting right dominant side: Secondary | ICD-10-CM

## 2016-01-10 DIAGNOSIS — I639 Cerebral infarction, unspecified: Secondary | ICD-10-CM

## 2016-01-10 DIAGNOSIS — R2681 Unsteadiness on feet: Secondary | ICD-10-CM

## 2016-01-10 DIAGNOSIS — R278 Other lack of coordination: Secondary | ICD-10-CM

## 2016-01-10 LAB — BASIC METABOLIC PANEL
BUN: 15 mg/dL (ref 6–23)
CO2: 26 mEq/L (ref 19–32)
Calcium: 9.8 mg/dL (ref 8.4–10.5)
Chloride: 101 mEq/L (ref 96–112)
Creatinine, Ser: 1.03 mg/dL (ref 0.40–1.50)
GFR: 92.97 mL/min (ref >60.00)
GLUCOSE: 165 mg/dL — AB (ref 70–99)
POTASSIUM: 4.3 meq/L (ref 3.5–5.1)
SODIUM: 137 meq/L (ref 135–145)

## 2016-01-10 MED ORDER — INSULIN GLARGINE 100 UNIT/ML SOLOSTAR PEN: 10.0000 [IU] | PEN_INJECTOR | Freq: Every day | SUBCUTANEOUS | 1 refills | Status: DC

## 2016-01-10 MED ORDER — INSULIN PEN NEEDLE 32G X 5 MM MISC: 1.0000 "application " | Freq: Every day | 1 refills | Status: DC

## 2016-01-10 MED ORDER — BLOOD GLUCOSE MONITOR KIT: PACK | 0 refills | Status: AC

## 2016-01-10 NOTE — Progress Notes (Signed)
Pre visit review using our clinic review tool, if applicable. No additional management support is needed unless otherwise documented below in the visit note. 

## 2016-01-10 NOTE — Therapy (Signed)
[facility] [address] [place], [place], [address] Phone: [phone]   Fax:  [phone]  Physical Therapy Evaluation  Patient Details  Name: Christian Clements MRN: [id number] Date of Birth: 1950/11/29 Referring Provider: Rosalin Hawking, MD  Encounter Date: 01/10/2016      PT End of Session - 01/10/16 1646    Visit Number 1   Number of Visits 17  eval + 16 visits   Date for PT Re-Evaluation 03/10/16   Authorization Type [employer] Medicare   Authorization Time Period G-code every 10th visit   PT Start Time 1448   PT Stop Time 1530   PT Time Calculation (min) 42 min   Activity Tolerance Patient tolerated treatment well   Behavior During Therapy [facility] for tasks assessed/performed      Past Medical History:  Diagnosis Date  . Diabetes mellitus   . Hypertension   . Prostatitis 2011    History reviewed. No pertinent surgical history.  There were no vitals filed for this visit.       Subjective Assessment - 01/10/16 1451    Subjective "I have numbness in my right leg and my right hand. And I have no control over it. It's wobbly."   Patient is accompained by: Family member  wife, [name]   Pertinent History Likes to be called "[name]".  PMH significant: HTN and DM II,   Patient Stated Goals "I want to be able to move around and do things I did before I had this stroke. I like to golf and I work out 5-6 times per week."   Currently in Pain? No/denies            [facility] PT Assessment - 01/10/16 1445      Assessment   Medical Diagnosis CVA   Referring Provider Rosalin Hawking, MD   Onset Date/Surgical Date 01/04/16   Hand Dominance Right     Precautions   Precautions Fall     Balance Screen   Has the patient fallen in the past 6 months No   Has the patient had a decrease in activity level because of a fear of falling?  Yes   Is the patient reluctant to leave their home because of a fear of falling?  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 Impaired by gross assessment   Additional Comments Tingling/stinging feeling in R hand; R LE feels "really heavy"     Coordination   Gross Motor Movements are Fluid and Coordinated Yes   Fine Motor Movements are Fluid and Coordinated No   Heel Shin Test R side impaired     ROM / Strength   AROM / PROM / Strength AROM;Strength     AROM   Overall AROM  Within functional limits for tasks performed     Strength  Overall Strength Deficits   Strength Assessment Site Hip;Knee;Ankle   Right/Left hand --   Right/Left Hip Right;Left   Right Hip Flexion 4-/5   Right Hip Extension 3/5   Right Hip ABduction 3+/5   Left Hip Flexion 4+/5   Left Hip Extension 4-/5   Left Hip ABduction 4-/5   Right/Left Knee Right;Left   Right Knee Flexion 3+/5   Right Knee Extension 4/5   Left Knee Flexion 5/5   Left Knee Extension 5/5   Right/Left Ankle Right;Left   Right Ankle Dorsiflexion 4-/5   Left Ankle Dorsiflexion 5/5     Transfers   Transfers Sit to Stand;Stand to Sit;Stand Pivot Transfers   Sit to Stand 5: Supervision;Without upper extremity assist;From chair/3-in-1;From bed   Five time sit to stand comments  13.32 sec  Indicates fall risk   Stand to Sit 5: Supervision;Without upper extremity assist;To bed;To chair/3-in-1   Stand Pivot Transfers 5: Supervision      Ambulation/Gait   Ambulation/Gait Yes   Ambulation/Gait Assistance 5: Supervision   Ambulation Distance (Feet) 70 Feet  2x70', 10x40'   Assistive device None   Gait Pattern Step-through pattern;Decreased arm swing - right;Decreased stride length;Decreased hip/knee flexion - right;Decreased dorsiflexion - right;Trunk flexed;Poor foot clearance - right   Ambulation Surface Level;Indoor   Gait velocity 2.20 ft/sec  Indicates limited community ambulator   Stairs Yes   Stairs Assistance 5: Supervision   Stair Management Technique One rail Left;Alternating pattern   Number of Stairs 4   Height of Stairs 6     Functional Gait  Assessment   Gait assessed  Yes   Gait Level Surface Walks 20 ft in less than 7 sec but greater than 5.5 sec, uses assistive device, slower speed, mild gait deviations, or deviates 6-10 in outside of the 12 in walkway width.   Change in Gait Speed Able to change speed, demonstrates mild gait deviations, deviates 6-10 in outside of the 12 in walkway width, or no gait deviations, unable to achieve a major change in velocity, or uses a change in velocity, or uses an assistive device.   Gait with Horizontal Head Turns Performs head turns smoothly with slight change in gait velocity (eg, minor disruption to smooth gait path), deviates 6-10 in outside 12 in walkway width, or uses an assistive device.   Gait with Vertical Head Turns Performs task with slight change in gait velocity (eg, minor disruption to smooth gait path), deviates 6 - 10 in outside 12 in walkway width or uses assistive device   Gait and Pivot Turn Pivot turns safely in greater than 3 sec and stops with no loss of balance, or pivot turns safely within 3 sec and stops with mild imbalance, requires small steps to catch balance.   Step Over Obstacle Is able to step over one shoe box (4.5 in total height) but must slow down and adjust steps to clear box safely. May require verbal cueing.   Gait with Narrow Base of  Support Ambulates less than 4 steps heel to toe or cannot perform without assistance.   Gait with Eyes Closed Cannot walk 20 ft without assistance, severe gait deviations or imbalance, deviates greater than 15 in outside 12 in walkway width or will not attempt task.   Ambulating Backwards Walks 20 ft, slow speed, abnormal gait pattern, evidence for imbalance, deviates 10-15 in outside 12 in walkway width.   Steps Alternating feet, must use rail.   Total Score 14   FGA comment:  Indicates high fall risk                           PT Education - 01/10/16 1646    Education provided Yes   Education Details Pt educated on eval finding, POC, and future prognosis.   Person(s) Educated Patient;Spouse   Methods Explanation   Comprehension Verbalized understanding          PT Short Term Goals - 01/10/16 1700      PT SHORT TERM GOAL #1   Title Pt will be independent and verbalize understanding of initial HEP to continue progress made in therapy. (Target Date for all STGs: 02/07/16)   Time 4   Period Weeks   Status New     PT SHORT TERM GOAL #2   Title Pt will improve FGA to 18/30 to indicate improved functional gait and mobility.   Time 4   Period Weeks   Status New     PT SHORT TERM GOAL #3   Title Pt will improve 5 times sit to stand to < or = 12 sec indicating a decr risk of falls.   Time 4   Period Weeks   Status New     PT SHORT TERM GOAL #4   Title Pt will negotiate 8 stairs with supervision and no UE support to incr safety negotiating stairs at home.   Time 4   Period Weeks   Status New     PT SHORT TERM GOAL #5   Title Pt will ambulate 500' with mod I over level surfaces, ramps, and curbs to incr safey ambulating at home.   Time 4   Period Weeks   Status New           PT Long Term Goals - 01/10/16 1705      PT LONG TERM GOAL #1   Title Pt will be independent and verbalize understanding of HEP and on-going fitness plan to maintain progress made  in therapy and his active lifestyle. (Target Date for all LTGs: 03/06/16)   Time 8   Period Weeks   Status New     PT LONG TERM GOAL #2   Title Pt will improve FGA score to > or = 23/30 to indicate decr fall risk and improved functional mobility.   Time 8   Period Weeks   Status New     PT LONG TERM GOAL #3   Title Pt will improve gait speed to > 2.62 ft/sec to indicate status of community ambulator.   Time 8   Period Weeks   Status New     PT LONG TERM GOAL #4   Title Pt will negotiate 15 stairs with mod I and no UE support to improve safety with stairs at home.   Time 8   Period Weeks   Status New     PT LONG TERM GOAL #5   Title Pt will ambulate 108ft outdoors over unlevel surfaces, pavement, gravel, grass, ramps, and curbs with mod I to incr safety while ambulating in the community.   Time 8   Period Weeks   Status New               Plan - 01/10/16 1648    Clinical Impression Statement Pt is a 65 year old male who presents to outpatient PT with a diagnosis of CVA (01/04/16) with resulting R sided weakness and hospitalization (01/04/16 - 01/06/16).  PMH significant for type  2 DM and HTN.  Pt demonstrated deficits in R LE strength as compared to the L LE that is potentially impacting his functional gait and balance.  He scored a 14/30 on the FGA indicating he is a high risk of falls and demonstrates impaired functional gait and mobility.  His gait speed was recorded at 2.20 ft/sec indicating he is a limited community ambulator.  He also completed the five times sit to stand test in 13.32 sec indicating he is a fall risk.  Pt is very eager to begin therapy and demonstrates a desire to fully participate both during PT sessions and at home with his HEP.  His condition appears to be evolving and of moderate complexity.  Pt would benefit from skilled PT to address his said impairments.   Rehab Potential Excellent   Clinical Impairments Affecting Rehab Potential HTN, Type 2 DM    PT Frequency 2x / week   PT Duration 8 weeks  Expect pt will DC after 6 weeks   PT Treatment/Interventions ADLs/Self Care Home Management;Functional mobility training;Stair training;Gait training;Therapeutic activities;Therapeutic exercise;Balance training;Neuromuscular re-education;Patient/family education;Manual techniques;Energy conservation   PT Next Visit Plan Initiate HEP; begin functional gait and balance activites; strengthen R LE   Consulted and Agree with Plan of Care Patient;Family member/caregiver   Family Member Consulted Wife      Patient will benefit from skilled therapeutic intervention in order to improve the following deficits and impairments:  Abnormal gait, Decreased activity tolerance, Decreased knowledge of precautions, Decreased endurance, Decreased strength, Impaired perceived functional ability, Impaired sensation, Improper body mechanics, Impaired UE functional use, Decreased coordination  Visit Diagnosis: Hemiplegia and hemiparesis following cerebral infarction affecting right dominant side ([place]) - Plan: PT plan of care cert/re-cert  Other abnormalities of gait and mobility - Plan: PT plan of care cert/re-cert  Unsteadiness on feet - Plan: PT plan of care cert/re-cert  Muscle weakness (generalized) - Plan: PT plan of care cert/re-cert  Other lack of coordination - Plan: PT plan of care cert/re-cert      G-Codes - [id number]    Functional Assessment Tool Used FGA = 14/30   Functional Limitation Mobility: Walking and moving around   Mobility: Walking and Moving Around Current Status [id number]) At least 40 percent but less than 60 percent impaired, limited or restricted   Mobility: Walking and Moving Around Goal Status [id number]) At least 20 percent but less than 40 percent impaired, limited or restricted       Problem List Patient Active Problem List   Diagnosis Date Noted  . Acute CVA (cerebrovascular accident) ([place]) 01/05/2016  . Right sided weakness  01/05/2016  . Dyslipidemia associated with type 2 diabetes mellitus ([place]) 01/05/2016  . Hypokalemia 01/05/2016  . Type 2 diabetes mellitus with vascular disease ([place]) 10/21/2006    Katherine Mantle, SPT 01/11/2016, 3:03 PM  [facility] [address] [place], [place], [address] Phone: [phone]   Fax:  [phone]  Name: Christian Clements MRN: [id number] Date of Birth: 1951-03-13

## 2016-01-10 NOTE — Progress Notes (Signed)
Subjective:  Patient ID: Christian Clements, male    DOB: 07/16/1950  Age: 65 y.o. MRN: [id number]  CC: Cerebrovascular Accident (follow up Pt stated have stroke about 1 weeks ago.)   Cerebrovascular Accident  This is a new problem. The current episode started in the past 7 days. The problem has been gradually improving. Associated symptoms include weakness. Pertinent negatives include no abdominal pain, anorexia, arthralgias, change in bowel habit, chest pain, chills, congestion, coughing, diaphoresis, fatigue, fever, headaches, joint swelling, myalgias, nausea, neck pain, numbness, rash, sore throat, swollen glands, urinary symptoms, vertigo, visual change or vomiting. Associated symptoms comments: Right hemiplegia.   Mr. Christian Clements for follow-up 6 days post 2days hospitalization at [facility]. He presented with right arm and leg weakness. Presented to the hospital 2 days after onset of symptoms. CT head and MRI brain and found small acute supper CM infarct had the ventral pontomedullary junction and small acute left frontal subcortical white matter infarct. Possibly caused by emboli from heart and ascending aorta. 2-D echo indicates EF 55%. He was discharged home with aspirin 325 metformin 1000 mg, ACCURETIC for blood pressure, and Lipitor 40 mg. Physical therapy and occupational therapy has been initiated, he is scheduled to go twice a week outpatient. Patient denies any family history of stroke but his wife who is present and office today states that his dad had a stroke.  Diabetes is uncontrolled with hemoglobin A1c of 9.1, current use of metformin 1000 mg twice a day, patient denies using any other agent in the past. He was referred to endocrinology December 2016 by Dr. Alain Marion. Appointment was not scheduled. Patient and wife is requesting another referral to endocrinologist today. Patient does not check blood sugar at home. He maintains a low carb low salt low sugar diet, and exercises  regularly. Wife reports erratic blood sugars while in the hospital: A.m. blood sugars range from in 200s and 300s and bedtime blood sugar ranges in the 100s.  Patient denies any acute complaints today.  Outpatient Medications Prior to Visit  Medication Sig Dispense Refill  . aspirin 325 MG tablet Take 1 tablet (325 mg total) by mouth daily. 30 tablet 0  . atorvastatin (LIPITOR) 40 MG tablet Take 1 tablet (40 mg total) by mouth daily at 6 PM. 30 tablet 0  . Cholecalciferol 1000 UNITS tablet Take 1,000 Units by mouth daily.      . hydrocortisone (ANUSOL-HC) 25 MG suppository Place 1 suppository (25 mg total) rectally 2 (two) times daily. 20 suppository 3  . metFORMIN (GLUCOPHAGE) 1000 MG tablet Take 1 tablet (1,000 mg total) by mouth 2 (two) times daily with a meal. 180 tablet 3  . PROBIOTIC CAPS Take 1 capsule by mouth daily.      . quinapril-hydrochlorothiazide (ACCURETIC) 20-12.5 MG tablet Take 2 tablets by mouth daily. 180 tablet 2  . sildenafil (VIAGRA) 100 MG tablet Take 1 tablet (100 mg total) by mouth daily as needed. 10 tablet 11  . triamcinolone ointment (KENALOG) 0.5 % Apply 1 application topically 2 (two) times daily. 30 g 1  . zolpidem (AMBIEN) 10 MG tablet Take 0.5 tablets (5 mg total) by mouth at bedtime as needed. 30 tablet 5   No facility-administered medications prior to visit.     ROS See HPI  Objective:  BP 132/82 (BP Location: Left Arm, Patient Position: Sitting, Cuff Size: Large)   Pulse 86   Temp 97.7 F (36.5 C)   Ht '5\' 10"'$  (1.778 m)   Wt 220  lb (99.8 kg)   SpO2 99%   BMI 31.57 kg/m   BP Readings from Last 3 Encounters:  01/10/16 132/82  01/06/16 (!) 147/76  03/01/15 139/84    Wt Readings from Last 3 Encounters:  01/10/16 220 lb (99.8 kg)  01/04/16 225 lb (102.1 kg)  03/01/15 222 lb (100.7 kg)    Physical Exam  Constitutional: He is oriented to person, place, and time. No distress.  HENT:  Right Ear: External ear normal.  Left Ear: External ear  normal.  Nose: Nose normal.  Mouth/Throat: Oropharynx is clear and moist. No oropharyngeal exudate.  Eyes: Conjunctivae and EOM are normal. Pupils are equal, round, and reactive to light. No scleral icterus.  Neck: Normal range of motion. Neck supple.  Cardiovascular: Normal rate, regular rhythm and normal heart sounds.  Exam reveals no gallop and no friction rub.   No murmur heard. Pulmonary/Chest: Effort normal and breath sounds normal.  Abdominal: Soft. He exhibits no distension. There is no tenderness.  Musculoskeletal: Normal range of motion. He exhibits no edema.  Neurological: He is alert and oriented to person, place, and time. No cranial nerve deficit or sensory deficit. Coordination normal.  Slow gait slow gait. Decreased Right upper and lower extremity strength 4/5. No facial droop. Normal voice.   Skin: Skin is warm.  Psychiatric: He has a normal mood and affect. His behavior is normal. Thought content normal.  Vitals reviewed.   Lab Results  Component Value Date   WBC 7.5 01/05/2016   HGB 14.8 01/05/2016   HCT 42.2 01/05/2016   PLT 251 01/05/2016   GLUCOSE 159 (H) 01/06/2016   CHOL 92 01/05/2016   TRIG 174 (H) 01/05/2016   HDL 30 (L) 01/05/2016   LDLDIRECT 30.1 11/14/2010   LDLCALC 27 01/05/2016   ALT 26 01/04/2016   AST 21 01/04/2016   NA 139 01/06/2016   K 3.3 (L) 01/06/2016   CL 104 01/06/2016   CREATININE 0.96 01/06/2016   BUN 11 01/06/2016   CO2 25 01/06/2016   TSH 1.67 11/08/2012   PSA 0.32 11/14/2010   INR 1.04 01/04/2016   HGBA1C 9.1 (H) 01/05/2016    Ct Head Wo Contrast  Result Date: 01/04/2016 CLINICAL DATA:  Right upper extremity numbness and unsteady gait. History of hypertension. Diabetes. EXAM: CT HEAD WITHOUT CONTRAST TECHNIQUE: Contiguous axial images were obtained from the base of the skull through the vertex without intravenous contrast. COMPARISON:  None. FINDINGS: Brain: Mild low density in the periventricular white matter likely  related to small vessel disease. No mass lesion, hemorrhage, hydrocephalus, acute infarct, intra-axial, or extra-axial fluid collection. Vascular: No hyperdense vessel or unexpected calcification. Skull: Normal Sinuses/Orbits: Normal orbits and globes. Right ethmoid air cell mucous retention cyst or polyp. Clear mastoid air cells. Other: None IMPRESSION: 1.  No acute intracranial abnormality. 2. Mild small vessel ischemic change. Electronically Signed   By: Abigail Miyamoto M.D.   On: 01/04/2016 13:12   Mr Brain Wo Contrast  Result Date: 01/04/2016 CLINICAL DATA:  Acute onset of numbness of the right hand and fingers while exercising. EXAM: MRI HEAD WITHOUT CONTRAST MRA HEAD WITHOUT CONTRAST TECHNIQUE: Multiplanar, multiecho pulse sequences of the brain and surrounding structures were obtained without intravenous contrast. Angiographic images of the head were obtained using MRA technique without contrast. COMPARISON:  CT same day FINDINGS: MRI HEAD FINDINGS Brain: Diffusion imaging shows a sub cm acute infarction at the pontomedullary junction anteriorly just to the left of midline. There is also a small  linear white matter acute infarction in the left frontal subcortical white matter. There are mild chronic small-vessel changes of the pons. There are moderate chronic small-vessel ischemic changes of the cerebral hemispheric white matter. No large vessel territory infarction. No mass lesion, hemorrhage, hydrocephalus or extra-axial collection. Vascular: Major vessels at the base of the brain show flow. Skull and upper cervical spine: Negative Sinuses/Orbits: Clear Other: None significant MRA HEAD FINDINGS Both internal carotid arteries are widely patent into the brain. The anterior and middle cerebral vessels are normal without proximal stenosis, aneurysm or vascular malformation. Fetal origin of the left PCA. Large posterior communicating artery on the right. Both vertebral arteries are patent. The right  terminates in PICA. Left vertebral artery supplies the basilar. No basilar stenosis. Posterior circulation branch vessels are patent and normal. 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 Small acute left frontal subcortical white matter infarction. These 2 acute infarctions could be coincidental in due to ordinary small vessel disease, but emboli from the heart or ascending aorta could also result in this. Moderate chronic small-vessel ischemic changes of the cerebral hemispheric white matter for a age. Negative intracranial MR angiography of the large and medium size vessels. Electronically Signed   By: Nelson Chimes M.D.   On: 01/04/2016 17:41   Mr Jodene Nam Head/brain JH Cm  Result Date: 01/04/2016 CLINICAL DATA:  Acute onset of numbness of the right hand and fingers while exercising. EXAM: MRI HEAD WITHOUT CONTRAST MRA HEAD WITHOUT CONTRAST TECHNIQUE: Multiplanar, multiecho pulse sequences of the brain and surrounding structures were obtained without intravenous contrast. Angiographic images of the head were obtained using MRA technique without contrast. COMPARISON:  CT same day FINDINGS: MRI HEAD FINDINGS Brain: Diffusion imaging shows a sub cm acute infarction at the pontomedullary junction anteriorly just to the left of midline. There is also a small linear white matter acute infarction in the left frontal subcortical white matter. There are mild chronic small-vessel changes of the pons. There are moderate chronic small-vessel ischemic changes of the cerebral hemispheric white matter. No large vessel territory infarction. No mass lesion, hemorrhage, hydrocephalus or extra-axial collection. Vascular: Major vessels at the base of the brain show flow. Skull and upper cervical spine: Negative Sinuses/Orbits: Clear Other: None significant MRA HEAD FINDINGS Both internal carotid arteries are widely patent into the brain. The anterior and middle cerebral  vessels are normal without proximal stenosis, aneurysm or vascular malformation. Fetal origin of the left PCA. Large posterior communicating artery on the right. Both vertebral arteries are patent. The right terminates in PICA. Left vertebral artery supplies the basilar. No basilar stenosis. Posterior circulation branch vessels are patent and normal. 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 Small acute left frontal subcortical white matter infarction. These 2 acute infarctions could be coincidental in due to ordinary small vessel disease, but emboli from the heart or ascending aorta could also result in this. Moderate chronic small-vessel ischemic changes of the cerebral hemispheric white matter for a age. Negative intracranial MR angiography of the large and medium size vessels. Electronically Signed   By: Nelson Chimes M.D.   On: 01/04/2016 17:41    Assessment & Plan:   Rorey was seen today for cerebrovascular accident.  Diagnoses and all orders for this visit:  Type 2 diabetes mellitus with vascular disease ([place]) -     Cancel: Basic metabolic panel -     Discontinue:  Insulin Glargine (LANTUS SOLOSTAR) 100 UNIT/ML Solostar Pen; Inject 10 Units into the skin daily at 10 pm. -     blood glucose meter kit and supplies KIT; Dispense based on patient and insurance preference. Use two times daily as directed. (FOR ICD-9 250.00, 250.01). -     Insulin Pen Needle 32G X 5 MM MISC; 1 application by Does not apply route at bedtime. -     Ambulatory referral to Endocrinology -     Insulin Glargine (LANTUS SOLOSTAR) 100 UNIT/ML Solostar Pen; Inject 10 Units into the skin daily at 10 pm.  Acute CVA (cerebrovascular accident) ([place])  Dyslipidemia associated with type 2 diabetes mellitus ([place])  Right sided weakness  Hypokalemia -     Cancel: Basic metabolic panel -     Basic Metabolic Panel (BMET); Future   I am having Mr. Pelfrey start on  blood glucose meter kit and supplies and Insulin Pen Needle. I am also having him maintain his Probiotic, Cholecalciferol, zolpidem, hydrocortisone, sildenafil, metFORMIN, quinapril-hydrochlorothiazide, triamcinolone ointment, aspirin, atorvastatin, and Insulin Glargine.  Meds ordered this encounter  Medications  . DISCONTD: Insulin Glargine (LANTUS SOLOSTAR) 100 UNIT/ML Solostar Pen    Sig: Inject 10 Units into the skin daily at 10 pm.    Dispense:  15 mL    Refill:  1    Order Specific Question:   Supervising Provider    Answer:   Cassandria Anger [[id number]]  . blood glucose meter kit and supplies KIT    Sig: Dispense based on patient and insurance preference. Use two times daily as directed. (FOR ICD-9 250.00, 250.01).    Dispense:  1 each    Refill:  0    Order Specific Question:   Supervising Provider    Answer:   Cassandria Anger [[id number]]    Order Specific Question:   Number of strips    Answer:   100    Order Specific Question:   Number of lancets    Answer:   100  . Insulin Pen Needle 32G X 5 MM MISC    Sig: 1 application by Does not apply route at bedtime.    Dispense:  100 each    Refill:  1    Order Specific Question:   Supervising Provider    Answer:   Cassandria Anger [[id number]]  . Insulin Glargine (LANTUS SOLOSTAR) 100 UNIT/ML Solostar Pen    Sig: Inject 10 Units into the skin daily at 10 pm.    Dispense:  3 pen    Refill:  1    Order Specific Question:   Supervising Provider    Answer:   Cassandria Anger [[id number]]   Recent Results (from the past 2160 hour(s))  CBG monitoring, ED     Status: Abnormal   Collection Time: 01/04/16 12:54 PM  Result Value Ref Range   Glucose-Capillary 189 (H) 65 - 99 mg/dL  Protime-INR     Status: None   Collection Time: 01/04/16 12:55 PM  Result Value Ref Range   Prothrombin Time 13.7 11.4 - 15.2 seconds   INR 1.04   APTT     Status: None   Collection Time: 01/04/16 12:55 PM  Result Value Ref Range   aPTT 27 24 - 36 seconds    CBC     Status: None   Collection Time: 01/04/16 12:55 PM  Result Value Ref Range   WBC 9.3 4.0 - 10.5 K/uL   RBC 5.00 4.22 - 5.81 MIL/uL  Hemoglobin 15.8 13.0 - 17.0 g/dL   HCT 44.3 39.0 - 52.0 %   MCV 88.6 78.0 - 100.0 fL   MCH 31.6 26.0 - 34.0 pg   MCHC 35.7 30.0 - 36.0 g/dL   RDW 12.5 11.5 - 15.5 %   Platelets 256 150 - 400 K/uL  Differential     Status: None   Collection Time: 01/04/16 12:55 PM  Result Value Ref Range   Neutrophils Relative % 59 %   Neutro Abs 5.5 1.7 - 7.7 K/uL   Lymphocytes Relative 35 %   Lymphs Abs 3.3 0.7 - 4.0 K/uL   Monocytes Relative 5 %   Monocytes Absolute 0.5 0.1 - 1.0 K/uL   Eosinophils Relative 1 %   Eosinophils Absolute 0.1 0.0 - 0.7 K/uL   Basophils Relative 0 %   Basophils Absolute 0.0 0.0 - 0.1 K/uL  Comprehensive metabolic panel     Status: Abnormal   Collection Time: 01/04/16 12:55 PM  Result Value Ref Range   Sodium 138 135 - 145 mmol/L   Potassium 3.5 3.5 - 5.1 mmol/L   Chloride 105 101 - 111 mmol/L   CO2 20 (L) 22 - 32 mmol/L   Glucose, Bld 182 (H) 65 - 99 mg/dL   BUN 9 6 - 20 mg/dL   Creatinine, Ser 1.04 0.61 - 1.24 mg/dL   Calcium 9.8 8.9 - 10.3 mg/dL   Total Protein 7.7 6.5 - 8.1 g/dL   Albumin 4.6 3.5 - 5.0 g/dL   AST 21 15 - 41 U/L   ALT 26 17 - 63 U/L   Alkaline Phosphatase 61 38 - 126 U/L   Total Bilirubin 0.8 0.3 - 1.2 mg/dL   GFR calc non Af Amer >60 >60 mL/min   GFR calc Af Amer >60 >60 mL/min    Comment: (NOTE) The eGFR has been calculated using the CKD EPI equation. This calculation has not been validated in all clinical situations. eGFR's persistently <60 mL/min signify possible Chronic Kidney Disease.    Anion gap 13 5 - 15  I-stat troponin, ED     Status: None   Collection Time: 01/04/16  1:05 PM  Result Value Ref Range   Troponin i, poc 0.03 0.00 - 0.08 ng/mL   Comment 3            Comment: Due to the release kinetics of cTnI, a negative result within the first hours of the onset of symptoms does  not rule out myocardial infarction with certainty. If myocardial infarction is still suspected, repeat the test at appropriate intervals.   I-Stat Chem 8, ED     Status: Abnormal   Collection Time: 01/04/16  1:14 PM  Result Value Ref Range   Sodium 138 135 - 145 mmol/L   Potassium 3.8 3.5 - 5.1 mmol/L   Chloride 102 101 - 111 mmol/L   BUN 13 6 - 20 mg/dL   Creatinine, Ser 0.90 0.61 - 1.24 mg/dL   Glucose, Bld 179 (H) 65 - 99 mg/dL   Calcium, Ion 1.08 (L) 1.15 - 1.40 mmol/L   TCO2 26 0 - 100 mmol/L   Hemoglobin 15.6 13.0 - 17.0 g/dL   HCT 46.0 39.0 - 52.0 %  Glucose, capillary     Status: Abnormal   Collection Time: 01/04/16  5:48 PM  Result Value Ref Range   Glucose-Capillary 251 (H) 65 - 99 mg/dL   Comment 1 Notify RN    Comment 2 Document in Chart   Glucose,  capillary     Status: Abnormal   Collection Time: 01/04/16 10:28 PM  Result Value Ref Range   Glucose-Capillary 154 (H) 65 - 99 mg/dL   Comment 1 Notify RN    Comment 2 Document in Chart   Hemoglobin A1c     Status: Abnormal   Collection Time: 01/05/16  2:44 AM  Result Value Ref Range   Hgb A1c MFr Bld 9.1 (H) 4.8 - 5.6 %    Comment: (NOTE)         Pre-diabetes: 5.7 - 6.4         Diabetes: >6.4         Glycemic control for adults with diabetes: <7.0    Mean Plasma Glucose 214 mg/dL    Comment: (NOTE) Performed At: [facility] [place], [place] [id number] Lindon Romp MD [id number]   Lipid panel     Status: Abnormal   Collection Time: 01/05/16  2:44 AM  Result Value Ref Range   Cholesterol 92 0 - 200 mg/dL   Triglycerides 174 (H) <150 mg/dL   HDL 30 (L) >40 mg/dL   Total CHOL/HDL Ratio 3.1 RATIO   VLDL 35 0 - 40 mg/dL   LDL Cholesterol 27 0 - 99 mg/dL    Comment:        Total Cholesterol/HDL:CHD Risk Coronary Heart Disease Risk Table                     Men   Women  1/2 Average Risk   3.4   3.3  Average Risk       5.0   4.4  2 X Average Risk   9.6   7.1  3 X Average Risk   23.4   11.0        Use the calculated Patient Ratio above and the CHD Risk Table to determine the patient's CHD Risk.        ATP III CLASSIFICATION (LDL):  <100     mg/dL   Optimal  100-129  mg/dL   Near or Above                    Optimal  130-159  mg/dL   Borderline  160-189  mg/dL   High  >190     mg/dL   Very High   CBC     Status: None   Collection Time: 01/05/16  2:44 AM  Result Value Ref Range   WBC 7.5 4.0 - 10.5 K/uL   RBC 4.73 4.22 - 5.81 MIL/uL   Hemoglobin 14.8 13.0 - 17.0 g/dL   HCT 42.2 39.0 - 52.0 %   MCV 89.2 78.0 - 100.0 fL   MCH 31.3 26.0 - 34.0 pg   MCHC 35.1 30.0 - 36.0 g/dL   RDW 12.6 11.5 - 15.5 %   Platelets 251 150 - 400 K/uL  Basic metabolic panel     Status: Abnormal   Collection Time: 01/05/16  2:44 AM  Result Value Ref Range   Sodium 139 135 - 145 mmol/L   Potassium 3.0 (L) 3.5 - 5.1 mmol/L    Comment: DELTA CHECK NOTED   Chloride 103 101 - 111 mmol/L   CO2 25 22 - 32 mmol/L   Glucose, Bld 180 (H) 65 - 99 mg/dL   BUN 10 6 - 20 mg/dL   Creatinine, Ser 1.01 0.61 - 1.24 mg/dL   Calcium 8.9 8.9 - 10.3 mg/dL   GFR calc  non Af Amer >60 >60 mL/min   GFR calc Af Amer >60 >60 mL/min    Comment: (NOTE) The eGFR has been calculated using the CKD EPI equation. This calculation has not been validated in all clinical situations. eGFR's persistently <60 mL/min signify possible Chronic Kidney Disease.    Anion gap 11 5 - 15  Glucose, capillary     Status: Abnormal   Collection Time: 01/05/16  6:32 AM  Result Value Ref Range   Glucose-Capillary 195 (H) 65 - 99 mg/dL   Comment 1 Notify RN    Comment 2 Document in Chart   Rapid urine drug screen (hospital performed)     Status: None   Collection Time: 01/05/16  8:23 AM  Result Value Ref Range   Opiates NONE DETECTED NONE DETECTED   Cocaine NONE DETECTED NONE DETECTED   Benzodiazepines NONE DETECTED NONE DETECTED   Amphetamines NONE DETECTED NONE DETECTED   Tetrahydrocannabinol NONE DETECTED NONE  DETECTED   Barbiturates NONE DETECTED NONE DETECTED    Comment:        DRUG SCREEN FOR MEDICAL PURPOSES ONLY.  IF CONFIRMATION IS NEEDED FOR ANY PURPOSE, NOTIFY LAB WITHIN 5 DAYS.        LOWEST DETECTABLE LIMITS FOR URINE DRUG SCREEN Drug Class       Cutoff (ng/mL) Amphetamine      1000 Barbiturate      200 Benzodiazepine   761 Tricyclics       607 Opiates          300 Cocaine          300 THC              50   Glucose, capillary     Status: Abnormal   Collection Time: 01/05/16 11:07 AM  Result Value Ref Range   Glucose-Capillary 280 (H) 65 - 99 mg/dL   Comment 1 Notify RN    Comment 2 Document in Chart   Glucose, capillary     Status: Abnormal   Collection Time: 01/05/16  4:47 PM  Result Value Ref Range   Glucose-Capillary 106 (H) 65 - 99 mg/dL   Comment 1 Notify RN    Comment 2 Document in Chart   VAS US CAROTID (at [facility] and [facility] only)     Status: None   Collection Time: 01/05/16  6:36 PM  Result Value Ref Range   Right CCA prox sys -145 cm/s   Right CCA prox dias -24 cm/s   Right cca dist sys -51 cm/s   Left CCA prox sys 96 cm/s   Left CCA prox dias 24 cm/s   Left CCA dist sys -67 cm/s   Left CCA dist dias -19 cm/s   Left ICA prox sys -38 cm/s   Left ICA prox dias -13 cm/s   Left ICA dist sys -41 cm/s   Left ICA dist dias -18 cm/s   RIGHT ECA DIAS -7.00 cm/s   RIGHT VERTEBRAL DIAS -6.00 cm/s   LEFT ECA DIAS -16.00 cm/s   LEFT VERTEBRAL DIAS -9.00 cm/s  Glucose, capillary     Status: Abnormal   Collection Time: 01/05/16  9:13 PM  Result Value Ref Range   Glucose-Capillary 177 (H) 65 - 99 mg/dL  Basic metabolic panel     Status: Abnormal   Collection Time: 01/06/16  2:13 AM  Result Value Ref Range   Sodium 139 135 - 145 mmol/L   Potassium 3.3 (L) 3.5 - 5.1 mmol/L   Chloride  104 101 - 111 mmol/L   CO2 25 22 - 32 mmol/L   Glucose, Bld 159 (H) 65 - 99 mg/dL   BUN 11 6 - 20 mg/dL   Creatinine, Ser 0.96 0.61 - 1.24 mg/dL   Calcium 8.8 (L) 8.9 - 10.3 mg/dL   GFR  calc non Af Amer >60 >60 mL/min   GFR calc Af Amer >60 >60 mL/min    Comment: (NOTE) The eGFR has been calculated using the CKD EPI equation. This calculation has not been validated in all clinical situations. eGFR's persistently <60 mL/min signify possible Chronic Kidney Disease.    Anion gap 10 5 - 15  Glucose, capillary     Status: Abnormal   Collection Time: 01/06/16  6:50 AM  Result Value Ref Range   Glucose-Capillary 182 (H) 65 - 99 mg/dL   Comment 1 Notify RN    Comment 2 Document in Chart   Glucose, capillary     Status: Abnormal   Collection Time: 01/06/16 11:27 AM  Result Value Ref Range   Glucose-Capillary 320 (H) 65 - 99 mg/dL  Echocardiogram     Status: None   Collection Time: 01/06/16 12:39 PM  Result Value Ref Range   Weight 3,600 oz   Height 70 in   BP 143/93 mmHg  Basic Metabolic Panel (BMET)     Status: Abnormal   Collection Time: 01/10/16  9:09 AM  Result Value Ref Range   Sodium 137 135 - 145 mEq/L   Potassium 4.3 3.5 - 5.1 mEq/L   Chloride 101 96 - 112 mEq/L   CO2 26 19 - 32 mEq/L   Glucose, Bld 165 (H) 70 - 99 mg/dL   BUN 15 6 - 23 mg/dL   Creatinine, Ser 1.03 0.40 - 1.50 mg/dL   Calcium 9.8 8.4 - 10.5 mg/dL   GFR 92.97 >60.00 mL/min   Follow-up: Return in about 1 month (around 02/10/2016).  Wilfred Lacy, NP

## 2016-01-10 NOTE — Patient Instructions (Signed)
Check blood sugar before breakfast and at bedtime. Record blood sugar and bring to next office visit.   Hypoglycemia Low blood sugar (hypoglycemia) means that the level of sugar in your blood is lower than it should be. Signs of low blood sugar include:  Getting sweaty.  Feeling hungry.  Feeling dizzy or weak.  Feeling sleepier than normal.  Feeling nervous.  Headaches.  Having a fast heartbeat. Low blood sugar can happen fast and can be an emergency. Your doctor can do tests to check your blood sugar level. You can have low blood sugar and not have diabetes. HOME CARE  Check your blood sugar as told by your doctor. If it is less than 70 mg/dl or as told by your doctor, take 1 of the following:  3 to 4 glucose tablets.   cup clear juice.   cup soda pop, not diet.  1 cup milk.  5 to 6 hard candies.  Recheck blood sugar after 15 minutes. Repeat until it is at the right level.  Eat a snack if it is more than 1 hour until the next meal.  Only take medicine as told by your doctor.  Do not skip meals. Eat on time.  Do not drink alcohol except with meals.  Check your blood glucose before driving.  Check your blood glucose before and after exercise.  Always carry treatment with you, such as glucose pills.  Always wear a medical alert bracelet if you have diabetes. GET HELP RIGHT AWAY IF:   Your blood glucose goes below 70 mg/dl or as told by your doctor, and you:  Are confused.  Are not able to swallow.  Pass out (faint).  You cannot treat yourself. You may need someone to help you.  You have low blood sugar problems often.  You have problems from your medicines.  You are not feeling better after 3 to 4 days.  You have vision changes. MAKE SURE YOU:   Understand these instructions.  Will watch this condition.  Will get help right away if you are not doing well or get worse.   This information is not intended to replace advice given to you by  your health care provider. Make sure you discuss any questions you have with your health care provider.   Document Released: 06/04/2009 Document Revised: 03/31/2014 Document Reviewed: 11/14/2014 Elsevier Interactive Patient Education [employer].

## 2016-01-15 ENCOUNTER — Ambulatory Visit: Payer: Medicare Other | Admitting: *Deleted

## 2016-01-15 ENCOUNTER — Encounter: Payer: Self-pay | Admitting: *Deleted

## 2016-01-15 DIAGNOSIS — I69318 Other symptoms and signs involving cognitive functions following cerebral infarction: Secondary | ICD-10-CM

## 2016-01-15 DIAGNOSIS — I69351 Hemiplegia and hemiparesis following cerebral infarction affecting right dominant side: Secondary | ICD-10-CM

## 2016-01-15 DIAGNOSIS — I69851 Hemiplegia and hemiparesis following other cerebrovascular disease affecting right dominant side: Secondary | ICD-10-CM | POA: Diagnosis not present

## 2016-01-15 DIAGNOSIS — R2681 Unsteadiness on feet: Secondary | ICD-10-CM

## 2016-01-15 DIAGNOSIS — R278 Other lack of coordination: Secondary | ICD-10-CM

## 2016-01-15 DIAGNOSIS — M6281 Muscle weakness (generalized): Secondary | ICD-10-CM

## 2016-01-15 NOTE — Therapy (Signed)
[facility] [address] [place], [place], [address] Phone: [phone]   Fax:  [phone]  Occupational Therapy Treatment  Patient Details  Name: Christian Clements MRN: [id number] Date of Birth: 1950-12-27 Referring Provider: Dr Rosalin Hawking  Encounter Date: 01/15/2016      OT End of Session - 01/15/16 1157    Visit Number 2   Number of Visits 16   Date for OT Re-Evaluation 03/05/16  G code every 10 visits   [employer]   Authorization - Visit Number 2   Authorization - Number of Visits 10  2/10 G   OT Start Time 1103   OT Stop Time 1145   OT Time Calculation (min) 42 min   Activity Tolerance Patient tolerated treatment well   Behavior During Therapy [facility] for tasks assessed/performed      Past Medical History:  Diagnosis Date  . Diabetes mellitus   . Hypertension   . Prostatitis 2011    History reviewed. No pertinent surgical history.  There were no vitals filed for this visit.      Subjective Assessment - 01/15/16 1109    Subjective  Pt denies pain, brought in HEP as issued in actue OT setting for coordination and putty exercises.   Patient is accompained by: Family member  Spouse   Currently in Pain? No/denies   Pain Score 0-No pain                      OT Treatments/Exercises (OP) - 01/15/16 0001      Exercises   Exercises Neurological Re-education/Therapeutic Activity     Fine Motor Coordination   Fine Motor Coordination In hand manipuation training;Manipulation of small objects;Flipping cards;Dealing card with thumb;Picking up coins;Manipulating coins;Stacking coins;Tossing ball;Handwriting;Nuts and Bolts   Other Fine Motor Exercises Hom eprogram upgraded from acute OT to include in hand manipulation training, manipulation of small objects, flipping cards, dealing cards with thumb, picking up coins and stacking, tossing ball, nuts  and bolts & handwriting. Also issued red theraputty for 3 point and lateral pinch, grip RUE. Pr requires consistant vc's for proper positioning during FM/ccordination activities as he has compensatory patterns/shoulder hike etc. Pt with moderate difficulty w/ activities RUE.   Other Fine Motor Exercises Written HEP (as stated above) was issued, reviewed and performed in clinic today x97min      Lateral Pinch Strengthening (Resistive Putty)    Squeeze between thumb and side of each finger in turn. Repeat _10_ times. Do _1-2__ sessions per day.  Three Jaw Chuck Pinch Strengthening (Resistive Putty)    Pull putty, using thumb, index and middle fingers. Repeat 10 times. Do _1-2_ sessions per day.   Coordination Activities  Perform the following activities for 10 minutes 2-3 times per day with right hand.  Rotate ball in fingertips (clockwise and counter-clockwise). Toss ball in air and catch with the same hand. Flip cards 1 at a time as fast as you can. Deal cards with your thumb (Hold deck in hand and push card off top with thumb). Shuffle cards. Pick up coins and place in container or coin bank. Pick up coins and stack. Pick up coins one at a time until you get 5-10 in your hand, then move coins from palm to fingertips to stack one at a time. Practice writing and/or typing. Screw together nuts and bolts, then unfasten.            OT Education -  01/15/16 1156    Education provided Yes   Education Details Upgraded HEP for FM/coordination RUE and POC.   Person(s) Educated Patient;Spouse   Methods Explanation;Demonstration;Handout;Verbal cues   Comprehension Verbalized understanding;Returned demonstration          OT Short Term Goals - 01/09/16 1124      OT SHORT TERM GOAL #1   Title Pt will be Mod I HEP for AROM and coordination RUE   Time 4   Period Weeks   Status New     OT SHORT TERM GOAL #2   Title Pt will demonstrate improved grip strength by 5# or more  RUE as seen by JAMAR geip assessment   Time 4   Period Weeks   Status New     OT SHORT TERM GOAL #3   Title Pt will be Mod I stating 3 possible signs/symptoms/risk factors of CVA   Time 4   Period Weeks   Status New           OT Long Term Goals - 01/09/16 1126      OT LONG TERM GOAL #1   Title Pt will be Mod I upgraded HEP for RUE   Time 8   Period Weeks   Status New     OT LONG TERM GOAL #2   Title Pt will be Mod I simple meal/snack prep in ADL kitchen while maintaining safety   Time 8   Period Weeks   Status New     OT LONG TERM GOAL #3   Title Pt will demonstrate AROM RUE WNL's w/o c/o joint tightness at end range for shoulder flexion (as compared to LUE)   Time 8   Period Weeks   Status New     OT LONG TERM GOAL #4   Title Pt will demonstrate improved MMT RUE of 4/5 or greater   Baseline MMT grossly 3/5 at eval on 01/09/16   Time 8   Period Weeks   Status New     OT LONG TERM GOAL #5   Title Pt will demonstrate improved grip strength RUE to 60# or greater RUE   Baseline See Eval 01/09/16   Time 8   Period Weeks   Status New     Long Term Additional Goals   Additional Long Term Goals Yes     OT LONG TERM GOAL #6   Title Pt will demonstrate improved 9 Hole Peg test score R UE to less than 1 min for improved coordination & functional hand use   Baseline 01/09/16 1 min 43 seconds   Time 8   Period Weeks   Status New               Plan - 01/15/16 1158    Clinical Impression Statement 01/09/16 Pt is a 65 y/o RHD male s/p 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 He noticed symptoms on 01/02/16 of R UE/LE weakness and presented to ER on 01/04/16 where he was admitted w/ CVA as stated. He received acute PT/OT and was d/c home on 01/06/16. Pt currently demonstrates deficits in the areas of RUE coordination, strength, AROM, cognition,  vision & balance which affects his ability to perform ADL's and IADL's. He should benefit from out-pt Neuro Rehab OT to assist in maximizing independence with ADL/IADL's, coordination, strength, functional use of RUE & pt/Family education. Pt spouse reports tha the  has f/u w/ Dr Rosalin Hawking in November. Will follow in out-pt OT. 01/15/16: Pt should benfit from upgraded HEP for RUE, see treatment section of note for details.   Rehab Potential Good   OT Frequency 2x / week   OT Duration 8 weeks   OT Treatment/Interventions Self-care/ADL training;Fluidtherapy;DME and/or AE instruction;Splinting;Patient/family education;Balance training;Therapeutic exercises;Therapeutic exercise;Therapeutic activities;Cognitive remediation/compensation;Functional Mobility Training;Neuromuscular education;Manual Therapy;Visual/perceptual remediation/compensation   Plan Review Coordination HEP RUE, Add RUE AROM/strengthening consider low range red theraband ex's.Geanie Cooley and Agree with Plan of Care Patient;Family member/caregiver   Family Member Consulted Spouse      Patient will benefit from skilled therapeutic intervention in order to improve the following deficits and impairments:  Abnormal gait, Decreased coordination, Decreased range of motion, Impaired flexibility, Impaired sensation, Decreased activity tolerance, Cardiopulmonary status limiting activity, Decreased knowledge of precautions, Impaired UE functional use, Decreased knowledge of use of DME, Decreased balance, Decreased mobility, Decreased strength, Impaired vision/preception, Decreased cognition  Visit Diagnosis: Hemiplegia and hemiparesis following cerebral infarction affecting right dominant side (HCC)  Unsteadiness on feet  Muscle weakness (generalized)  Other lack of coordination  Other symptoms and signs involving cognitive functions following cerebral infarction    Problem List Patient Active Problem List   Diagnosis Date Noted  .  Acute CVA (cerebrovascular accident) ([place]) 01/05/2016  . Right sided weakness 01/05/2016  . Dyslipidemia associated with type 2 diabetes mellitus ([place]) 01/05/2016  . Hypokalemia 01/05/2016  . Type 2 diabetes mellitus with vascular disease ([place]) 10/21/2006    Tiawana Forgy Ardath Sax, OTR/L 01/15/2016, 12:09 PM  [facility] [address] [place], [place], [address] Phone: [phone]   Fax:  [phone]  Name: Calcifer Schweitzer MRN: [id number] Date of Birth: 1950/09/28

## 2016-01-15 NOTE — Patient Instructions (Signed)
Lateral Pinch Strengthening (Resistive Putty)    Squeeze between thumb and side of each finger in turn. Repeat _10_ times. Do _1-2__ sessions per day.  Three Jaw Chuck Pinch Strengthening (Resistive Putty)    Pull putty, using thumb, index and middle fingers. Repeat 10 times. Do _1-2_ sessions per day.   Coordination Activities  Perform the following activities for 10 minutes 2-3 times per day with right hand.  Rotate ball in fingertips (clockwise and counter-clockwise). Toss ball in air and catch with the same hand. Flip cards 1 at a time as fast as you can. Deal cards with your thumb (Hold deck in hand and push card off top with thumb). Shuffle cards. Pick up coins and place in container or coin bank. Pick up coins and stack. Pick up coins one at a time until you get 5-10 in your hand, then move coins from palm to fingertips to stack one at a time. Practice writing and/or typing. Screw together nuts and bolts, then unfasten.

## 2016-01-17 ENCOUNTER — Encounter: Payer: Self-pay | Admitting: Physical Therapy

## 2016-01-17 ENCOUNTER — Ambulatory Visit: Payer: Medicare Other | Admitting: Physical Therapy

## 2016-01-17 DIAGNOSIS — I69851 Hemiplegia and hemiparesis following other cerebrovascular disease affecting right dominant side: Secondary | ICD-10-CM | POA: Diagnosis not present

## 2016-01-17 DIAGNOSIS — I69351 Hemiplegia and hemiparesis following cerebral infarction affecting right dominant side: Secondary | ICD-10-CM

## 2016-01-17 DIAGNOSIS — R2681 Unsteadiness on feet: Secondary | ICD-10-CM

## 2016-01-17 DIAGNOSIS — M6281 Muscle weakness (generalized): Secondary | ICD-10-CM

## 2016-01-17 NOTE — Therapy (Signed)
[facility] [address] [place], [place], [address] Phone: [phone]   Fax:  [phone]  Physical Therapy Treatment  Patient Details  Name: Christian Clements MRN: [id number] Date of Birth: Jan 14, 1951 Referring Provider: Rosalin Hawking, MD  Encounter Date: 01/17/2016      PT End of Session - 01/17/16 0859    Visit Number 2   Number of Visits 17   Date for PT Re-Evaluation 03/10/16   Authorization Type [employer] Medicare   Authorization Time Period G-code every 10th visit   PT Start Time 0805   PT Stop Time 0847   PT Time Calculation (min) 42 min   Activity Tolerance Patient tolerated treatment well   Behavior During Therapy [facility] for tasks assessed/performed      Past Medical History:  Diagnosis Date  . Diabetes mellitus   . Hypertension   . Prostatitis 2011    History reviewed. No pertinent surgical history.  There were no vitals filed for this visit.      Subjective Assessment - 01/17/16 0809    Subjective Pt reports no issues or falls since last visit.  States "things have been going well'.   Patient is accompained by: Family member  wife, Christian Clements   Pertinent History Likes to be called "Christian Clements".  PMH significant: HTN and DM II,   Patient Stated Goals "I want to be able to move around and do things I did before I had this stroke. I like to golf and I work out 5-6 times per week."   Currently in Pain? No/denies   Multiple Pain Sites No                         [facility] Adult PT Treatment/Exercise - 01/17/16 0800      Transfers   Transfers Sit to Stand;Stand to [employer] Transfers   Sit to Stand 5: Supervision;Without upper extremity assist;From bed   Sit to Stand Details (indicate cue type and reason) Verbal cuing needed to prevent L lat trunk lean and ensure equal weight bearing on B LE when sitting and standing.   Stand to Sit 5: Supervision;Without upper extremity assist;To bed   Stand  to Sit Details Verbal cuing to control descent back to table and to "tap" the table and not fully sit down   Stand Pivot Transfers 5: Supervision     Ambulation/Gait   Ambulation/Gait Yes   Ambulation/Gait Assistance 5: Supervision   Ambulation Distance (Feet) 100 Feet  2x100', 2x30'   Assistive device None   Gait Pattern Step-through pattern;Decreased stride length;Decreased hip/knee flexion - right;Decreased hip/knee flexion - left;Trunk flexed;Poor foot clearance - left;Poor foot clearance - right   Ambulation Surface Level;Indoor             Balance Exercises - 01/17/16 0856      Balance Exercises: Standing   Standing Eyes Opened Narrow base of support (BOS);Head turns;Foam/compliant surface;30 secs   Rockerboard Anterior/posterior;Head turns;Lateral;EO;EC;30 seconds;Intermittent UE support           PT Education - 01/17/16 0857    Education provided Yes   Education Details Pt educated on new HEP.   Person(s) Educated Patient;Spouse   Methods Explanation;Demonstration;Tactile cues;Verbal cues;Handout   Comprehension Verbalized understanding;Returned demonstration          PT Short Term Goals - 01/10/16 1700      PT SHORT TERM GOAL #1   Title Pt will be independent and verbalize understanding of initial  HEP to continue progress made in therapy. (Target Date for all STGs: 02/07/16)   Time 4   Period Weeks   Status New     PT SHORT TERM GOAL #2   Title Pt will improve FGA to 18/30 to indicate improved functional gait and mobility.   Time 4   Period Weeks   Status New     PT SHORT TERM GOAL #3   Title Pt will improve 5 times sit to stand to < or = 12 sec indicating a decr risk of falls.   Time 4   Period Weeks   Status New     PT SHORT TERM GOAL #4   Title Pt will negotiate 8 stairs with supervision and no UE support to incr safety negotiating stairs at home.   Time 4   Period Weeks   Status New     PT SHORT TERM GOAL #5   Title Pt will ambulate  500' with mod I over level surfaces, ramps, and curbs to incr safey ambulating at home.   Time 4   Period Weeks   Status New           PT Long Term Goals - 01/10/16 1705      PT LONG TERM GOAL #1   Title Pt will be independent and verbalize understanding of HEP and on-going fitness plan to maintain progress made in therapy and his active lifestyle. (Target Date for all LTGs: 03/06/16)   Time 8   Period Weeks   Status New     PT LONG TERM GOAL #2   Title Pt will improve FGA score to > or = 23/30 to indicate decr fall risk and improved functional mobility.   Time 8   Period Weeks   Status New     PT LONG TERM GOAL #3   Title Pt will improve gait speed to > 2.62 ft/sec to indicate status of community ambulator.   Time 8   Period Weeks   Status New     PT LONG TERM GOAL #4   Title Pt will negotiate 15 stairs with mod I and no UE support to improve safety with stairs at home.   Time 8   Period Weeks   Status New     PT LONG TERM GOAL #5   Title Pt will ambulate 1052ft outdoors over unlevel surfaces, pavement, gravel, grass, ramps, and curbs with mod I to incr safety while ambulating in the community.   Time 8   Period Weeks   Status New               Plan - 01/17/16 0859    Clinical Impression Statement Pt tolerated treatment well today with the focus on establishing a HEP designed to improve his LE strength and challenge his standing balance.  Initially, he was demonstated difficulty with exercises, however, was able to improve as treatment progressed.  Pt will benefit from more advanced activites challenging dynamic gait, strength, and dynamic balance and states that he has access to a pool where he is able to exercise.  He would continue to benefit from skilled PT to address his impairments and progress toward his goals.   Rehab Potential Excellent   Clinical Impairments Affecting Rehab Potential HTN, Type 2 DM   PT Frequency 2x / week   PT Duration 8 weeks   Expect pt will DC after 6 weeks   PT Treatment/Interventions ADLs/Self Care Home Management;Functional mobility training;Stair training;Gait training;Therapeutic activities;Therapeutic exercise;Balance  training;Neuromuscular re-education;Patient/family education;Manual techniques;Energy conservation   PT Next Visit Plan Initiate HEP; begin functional gait and balance activites; strengthen R LE   PT Home Exercise Plan bridging, water activites, sit<>stands   Consulted and Agree with Plan of Care Patient;Family member/caregiver   Family Member Consulted Wife      Patient will benefit from skilled therapeutic intervention in order to improve the following deficits and impairments:  Abnormal gait, Decreased activity tolerance, Decreased knowledge of precautions, Decreased endurance, Decreased strength, Impaired perceived functional ability, Impaired sensation, Improper body mechanics, Impaired UE functional use, Decreased coordination  Visit Diagnosis: Hemiplegia and hemiparesis following cerebral infarction affecting right dominant side (HCC)  Unsteadiness on feet  Muscle weakness (generalized)     Problem List Patient Active Problem List   Diagnosis Date Noted  . Acute CVA (cerebrovascular accident) ([place]) 01/05/2016  . Right sided weakness 01/05/2016  . Dyslipidemia associated with type 2 diabetes mellitus ([place]) 01/05/2016  . Hypokalemia 01/05/2016  . Type 2 diabetes mellitus with vascular disease ([place]) 10/21/2006    Katherine Mantle , SPT 01/17/2016, 9:08 AM  [facility] [address] [place], [place], [address] Phone: [phone]   Fax:  [phone]  Name: Christian Clements MRN: [id number] Date of Birth: [birth year]

## 2016-01-17 NOTE — Patient Instructions (Addendum)
Bridging  While lying on your back, tighten your lower abdominals, squeeze your buttocks and then raise your buttocks off the floor/bed as creating a "Bridge" with your body. Hold 3 seconds and then lower yourself back down.  Perform 3 sets of 15 repetitions.       HIP / KNEE: Extension - Sit to Stand    Sitting, lean chest forward, raise hips up from surface. Straighten hips and knees. Weight bear equally on left and right sides. Backs of legs should not push off surface. 15 - 20 reps per set, 3-4 sets per day, 5-6 days Clap Under Walking    Step forward lifting opposite hip and knee to 90. Clap under thigh. Alternate legs. Perform __2_ laps. Perform _2__ laps stepping backward.  Braiding Walking    Step sideways, placing right foot out to the side, left foot in front, right foot out to the side, left foot behind. Perform _2__ laps side stepping right. Perform __2_ laps side stepping left.   Side Stepping Lunge    Lunge to side, bending hip and knee to 90. Keep other leg straight. Return to stand, bringing straight leg to other leg. Perform _2__ laps side stepping right. Perform __2_ laps side stepping left.  Copyright  VHI. All rights reserved.

## 2016-01-18 ENCOUNTER — Ambulatory Visit: Payer: Medicare Other | Admitting: Occupational Therapy

## 2016-01-18 DIAGNOSIS — I69351 Hemiplegia and hemiparesis following cerebral infarction affecting right dominant side: Secondary | ICD-10-CM

## 2016-01-18 DIAGNOSIS — I69851 Hemiplegia and hemiparesis following other cerebrovascular disease affecting right dominant side: Secondary | ICD-10-CM | POA: Diagnosis not present

## 2016-01-18 DIAGNOSIS — M6281 Muscle weakness (generalized): Secondary | ICD-10-CM

## 2016-01-18 DIAGNOSIS — R278 Other lack of coordination: Secondary | ICD-10-CM

## 2016-01-18 NOTE — Therapy (Signed)
[facility] [address] [place], [place], [address] Phone: [phone]   Fax:  [phone]  Occupational Therapy Treatment  Patient Details  Name: Christian Clements MRN: [id number] Date of Birth: 02-03-1951 Referring Provider: Dr Rosalin Hawking  Encounter Date: 01/18/2016      OT End of Session - 01/18/16 0847    Visit Number 3   Number of Visits 16   Date for OT Re-Evaluation 03/05/16   Authorization Type [employer]   Authorization - Visit Number 3   Authorization - Number of Visits 10   OT Start Time 0805   OT Stop Time 0845   OT Time Calculation (min) 40 min   Activity Tolerance Patient tolerated treatment well   Behavior During Therapy [facility] for tasks assessed/performed      Past Medical History:  Diagnosis Date  . Diabetes mellitus   . Hypertension   . Prostatitis 2011    No past surgical history on file.  There were no vitals filed for this visit.      Subjective Assessment - 01/18/16 0807    Currently in Pain? No/denies        Arm bike x 5 mins  Level 4 for reciprocal movement Handwriting activities for improved legibility, min v.c. Foam grip issued for pen                      OT Education - 01/18/16 0810    Education provided Yes   Education Details Reviewed red putty exercises for grip and pinch, coordination HEP   Person(s) Educated Patient   Methods Explanation;Demonstration   Comprehension Verbalized understanding;Returned demonstration          OT Short Term Goals - 01/09/16 1124      OT SHORT TERM GOAL #1   Title Pt will be Mod I HEP for AROM and coordination RUE   Time 4   Period Weeks   Status New     OT SHORT TERM GOAL #2   Title Pt will demonstrate improved grip strength by 5# or more RUE as seen by JAMAR geip assessment   Time 4   Period Weeks   Status New     OT SHORT TERM GOAL #3   Title Pt will be Mod I stating 3  possible signs/symptoms/risk factors of CVA   Time 4   Period Weeks   Status New           OT Long Term Goals - 01/09/16 1126      OT LONG TERM GOAL #1   Title Pt will be Mod I upgraded HEP for RUE   Time 8   Period Weeks   Status New     OT LONG TERM GOAL #2   Title Pt will be Mod I simple meal/snack prep in ADL kitchen while maintaining safety   Time 8   Period Weeks   Status New     OT LONG TERM GOAL #3   Title Pt will demonstrate AROM RUE WNL's w/o c/o joint tightness at end range for shoulder flexion (as compared to LUE)   Time 8   Period Weeks   Status New     OT LONG TERM GOAL #4   Title Pt will demonstrate improved MMT RUE of 4/5 or greater   Baseline MMT grossly 3/5 at eval on 01/09/16   Time 8   Period Weeks   Status New     OT  LONG TERM GOAL #5   Title Pt will demonstrate improved grip strength RUE to 60# or greater RUE   Baseline See Eval 01/09/16   Time 8   Period Weeks   Status New     Long Term Additional Goals   Additional Long Term Goals Yes     OT LONG TERM GOAL #6   Title Pt will demonstrate improved 9 Hole Peg test score R UE to less than 1 min for improved coordination & functional hand use   Baseline 01/09/16 1 min 43 seconds   Time 8   Period Weeks   Status New               Plan - 01/18/16 [id number]    Clinical Impression Statement Pt is progressing towards goals . He demonstrates understanding of putty and coordination HEP.   Rehab Potential Good   OT Frequency 2x / week   OT Duration 8 weeks   OT Treatment/Interventions Self-care/ADL training;Fluidtherapy;DME and/or AE instruction;Splinting;Patient/family education;Balance training;Therapeutic exercises;Therapeutic exercise;Therapeutic activities;Cognitive remediation/compensation;Functional Mobility Training;Neuromuscular education;Manual Therapy;Visual/perceptual remediation/compensation   Plan RUE functional use, coordination       Patient will benefit from skilled  therapeutic intervention in order to improve the following deficits and impairments:  Abnormal gait, Decreased coordination, Decreased range of motion, Impaired flexibility, Impaired sensation, Decreased activity tolerance, Cardiopulmonary status limiting activity, Decreased knowledge of precautions, Impaired UE functional use, Decreased knowledge of use of DME, Decreased balance, Decreased mobility, Decreased strength, Impaired vision/preception, Decreased cognition  Visit Diagnosis: Hemiplegia and hemiparesis following cerebral infarction affecting right dominant side (HCC)  Muscle weakness (generalized)  Other lack of coordination    Problem List Patient Active Problem List   Diagnosis Date Noted  . Acute CVA (cerebrovascular accident) ([place]) 01/05/2016  . Right sided weakness 01/05/2016  . Dyslipidemia associated with type 2 diabetes mellitus ([place]) 01/05/2016  . Hypokalemia 01/05/2016  . Type 2 diabetes mellitus with vascular disease ([place]) 10/21/2006    RINE,KATHRYN 01/18/2016, 8:49 AM Theone Murdoch, OTR/L [phone] Phone: [phone] 3:07 PM 01/18/16 [facility] [address] [place], [place], [address] Phone: [phone]   Fax:  [phone]  Name: Christian Clements MRN: [id number] Date of Birth: 24-Apr-1950

## 2016-01-23 ENCOUNTER — Ambulatory Visit: Payer: Medicare Other | Admitting: Physical Therapy

## 2016-01-23 ENCOUNTER — Encounter: Payer: Self-pay | Admitting: Physical Therapy

## 2016-01-23 ENCOUNTER — Ambulatory Visit: Payer: Medicare Other | Attending: Neurology | Admitting: Occupational Therapy

## 2016-01-23 DIAGNOSIS — I69351 Hemiplegia and hemiparesis following cerebral infarction affecting right dominant side: Secondary | ICD-10-CM | POA: Insufficient documentation

## 2016-01-23 DIAGNOSIS — R2689 Other abnormalities of gait and mobility: Secondary | ICD-10-CM | POA: Insufficient documentation

## 2016-01-23 DIAGNOSIS — R2681 Unsteadiness on feet: Secondary | ICD-10-CM

## 2016-01-23 DIAGNOSIS — R278 Other lack of coordination: Secondary | ICD-10-CM | POA: Insufficient documentation

## 2016-01-23 DIAGNOSIS — M6281 Muscle weakness (generalized): Secondary | ICD-10-CM | POA: Insufficient documentation

## 2016-01-23 DIAGNOSIS — I69318 Other symptoms and signs involving cognitive functions following cerebral infarction: Secondary | ICD-10-CM | POA: Insufficient documentation

## 2016-01-23 NOTE — Patient Instructions (Signed)
Laying on your back hold a ball between your hands , raise arms up to just above shoulder height without elevating shoulder or arching back,then  lower ball back to your waist. Keep elbows straight.   Perform 2 sets of 10 reps 1x day  Laying on your back, hold a ball in between both hands, straighten elbows, then bend elbows and bring ball to your chest  (chest press)   Perform 2 sets of 10 reps 1x day  raise arms up to shoulder height without elevating shoulder or bending elbow, then  lower ball back to your lap. Keep elbows straight.  Think about sitting up tall! Perform 2 sets of 10 reps 1x day

## 2016-01-23 NOTE — Therapy (Signed)
[facility] [address] [place], [place], [address] Phone: [phone]   Fax:  [phone]  Occupational Therapy Treatment  Patient Details  Name: Christian Clements MRN: [id number] Date of Birth: [birth year] Referring Provider: Dr Rosalin Hawking  Encounter Date: 01/23/2016      OT End of Session - 01/23/16 0922    Visit Number 4   Number of Visits 16   Date for OT Re-Evaluation 03/05/16   Authorization Type [employer] [place] Medicare   Authorization - Visit Number 4   Authorization - Number of Visits 10   OT Start Time [phone]   OT Stop Time 0930   OT Time Calculation (min) 38 min   Activity Tolerance Patient tolerated treatment well   Behavior During Therapy [facility] for tasks assessed/performed      Past Medical History:  Diagnosis Date  . Diabetes mellitus   . Hypertension   . Prostatitis 2011    No past surgical history on file.  There were no vitals filed for this visit.      Subjective Assessment - 01/23/16 0922    Subjective  Pt denies pain   Pertinent History see Epic   Currently in Pain? No/denies            Arm bike x 6 mins level 4 for conditioning Copying small peg design with RUE for increased fine motor coordination, min v.c. To avoid compensation.                   OT Education - 01/23/16 0919    Education provided Yes   Education Details ball ex, supine, seated   Person(s) Educated Patient;Spouse   Methods Explanation;Demonstration;Verbal cues;Handout   Comprehension Returned demonstration;Verbalized understanding;Verbal cues required          OT Short Term Goals - 01/09/16 1124      OT SHORT TERM GOAL #1   Title Pt will be Mod I HEP for AROM and coordination RUE   Time 4   Period Weeks   Status New     OT SHORT TERM GOAL #2   Title Pt will demonstrate improved grip strength by 5# or more RUE as seen by JAMAR geip assessment   Time 4   Period Weeks    Status New     OT SHORT TERM GOAL #3   Title Pt will be Mod I stating 3 possible signs/symptoms/risk factors of CVA   Time 4   Period Weeks   Status New           OT Long Term Goals - 01/09/16 1126      OT LONG TERM GOAL #1   Title Pt will be Mod I upgraded HEP for RUE   Time 8   Period Weeks   Status New     OT LONG TERM GOAL #2   Title Pt will be Mod I simple meal/snack prep in ADL kitchen while maintaining safety   Time 8   Period Weeks   Status New     OT LONG TERM GOAL #3   Title Pt will demonstrate AROM RUE WNL's w/o c/o joint tightness at end range for shoulder flexion (as compared to LUE)   Time 8   Period Weeks   Status New     OT LONG TERM GOAL #4   Title Pt will demonstrate improved MMT RUE of 4/5 or greater   Baseline MMT grossly 3/5 at eval on 01/09/16   Time 8  Period Weeks   Status New     OT LONG TERM GOAL #5   Title Pt will demonstrate improved grip strength RUE to 60# or greater RUE   Baseline See Eval 01/09/16   Time 8   Period Weeks   Status New     Long Term Additional Goals   Additional Long Term Goals Yes     OT LONG TERM GOAL #6   Title Pt will demonstrate improved 9 Hole Peg test score R UE to less than 1 min for improved coordination & functional hand use   Baseline 01/09/16 1 min 43 seconds   Time 8   Period Weeks   Status New               Plan - 01/23/16 0920    Clinical Impression Statement Pt is progressing towards goals for RUE functional use and coordination.   Rehab Potential Good   OT Frequency 2x / week   OT Duration 8 weeks   OT Treatment/Interventions Self-care/ADL training;Fluidtherapy;DME and/or AE instruction;Splinting;Patient/family education;Balance training;Therapeutic exercises;Therapeutic exercise;Therapeutic activities;Cognitive remediation/compensation;Functional Mobility Training;Neuromuscular education;Manual Therapy;Visual/perceptual remediation/compensation   Plan reinforce HEP, functional  use of RUE   Consulted and Agree with Plan of Care Patient;Family member/caregiver   Family Member Consulted Spouse      Patient will benefit from skilled therapeutic intervention in order to improve the following deficits and impairments:  Abnormal gait, Decreased coordination, Decreased range of motion, Impaired flexibility, Impaired sensation, Decreased activity tolerance, Cardiopulmonary status limiting activity, Decreased knowledge of precautions, Impaired UE functional use, Decreased knowledge of use of DME, Decreased balance, Decreased mobility, Decreased strength, Impaired vision/preception, Decreased cognition  Visit Diagnosis: Hemiplegia and hemiparesis following cerebral infarction affecting right dominant side (HCC)  Muscle weakness (generalized)  Other lack of coordination    Problem List Patient Active Problem List   Diagnosis Date Noted  . Acute CVA (cerebrovascular accident) ([place]) 01/05/2016  . Right sided weakness 01/05/2016  . Dyslipidemia associated with type 2 diabetes mellitus ([place]) 01/05/2016  . Hypokalemia 01/05/2016  . Type 2 diabetes mellitus with vascular disease ([place]) 10/21/2006    Jeanne Terrance 01/23/2016, 9:23 AM Theone Murdoch, OTR/L [phone] Phone: [phone] 9:24 AM 01/23/16 [facility] [address] [place], [place], [address] Phone: [phone]   Fax:  [phone]  Name: Christian Clements MRN: [id number] Date of Birth: 1951-03-11

## 2016-01-24 ENCOUNTER — Ambulatory Visit: Payer: Medicare Other | Admitting: Physical Therapy

## 2016-01-24 ENCOUNTER — Ambulatory Visit: Payer: Medicare Other | Admitting: *Deleted

## 2016-01-24 ENCOUNTER — Encounter: Payer: Self-pay | Admitting: *Deleted

## 2016-01-24 ENCOUNTER — Encounter: Payer: Self-pay | Admitting: Physical Therapy

## 2016-01-24 DIAGNOSIS — M6281 Muscle weakness (generalized): Secondary | ICD-10-CM

## 2016-01-24 DIAGNOSIS — I69351 Hemiplegia and hemiparesis following cerebral infarction affecting right dominant side: Secondary | ICD-10-CM

## 2016-01-24 DIAGNOSIS — R2689 Other abnormalities of gait and mobility: Secondary | ICD-10-CM

## 2016-01-24 DIAGNOSIS — R278 Other lack of coordination: Secondary | ICD-10-CM

## 2016-01-24 DIAGNOSIS — R2681 Unsteadiness on feet: Secondary | ICD-10-CM

## 2016-01-24 NOTE — Therapy (Signed)
[facility] [address] [place], [place], [address] Phone: [phone]   Fax:  [phone]  Physical Therapy Treatment  Patient Details  Name: Christian Clements MRN: [id number] Date of Birth: 1950-12-06 Referring Provider: Rosalin Hawking, MD  Encounter Date: 01/23/2016   01/23/16 0935  PT Visits / Re-Eval  Visit Number 3  Number of Visits 17  Date for PT Re-Evaluation 03/10/16  Authorization  Authorization Type [employer] Medicare  Authorization Time Period G-code every 10th visit  PT Time Calculation  PT Start Time 0933  PT Stop Time 1015  PT Time Calculation (min) 42 min  PT - End of Session  Activity Tolerance Patient tolerated treatment well  Behavior During Therapy [facility] for tasks assessed/performed     Past Medical History:  Diagnosis Date  . Diabetes mellitus   . Hypertension   . Prostatitis 2011    History reviewed. No pertinent surgical history.  There were no vitals filed for this visit.    01/23/16 0933  Symptoms/Limitations  Subjective No new complaints. No falls to report. Reports no issues with HEP and has been going to the pool almost every day.   Patient is accompained by: Family member (spouse, Christian Clements)  Pertinent History Likes to be called "[name]".  PMH significant: HTN and DM II,  Patient Stated Goals "I want to be able to move around and do things I did before I had this stroke. I like to golf and I work out 5-6 times per week."  Pain Assessment  Currently in Pain? No/denies  Pain Score 0      01/23/16 0937  High Level Balance  High Level Balance Activities Tandem walking;Marching forwards;Marching backwards (tandem fwd/bwd, toe walking fwd/bed, heel walking fwd/bwd)  High Level Balance Comments red mats next to counter top: performed 3 laps each/each way with min guard to min assist for balance, no UE support. cues on posture and ex form.  Knee/Hip Exercises: Supine  Bridges  AROM;Strengthening;Both;10 reps;Limitations;2 sets  Bridges Limitations cues on hold times and ex form with all bridging ex's  Single Leg Bridge AROM;Strengthening;Right;10 reps;2 sets  Straight Leg Raises AROM;Strengthening;Right;2 sets;10 reps;Limitations  Straight Leg Raises Limitations cues for knee extension, hold times and to slowly lower leg back down to mat  Knee/Hip Exercises: Prone  Straight Leg Raises AROM;Strengthening;Right;2 sets;10 reps;Limitations  Straight Leg Raises Limitations cues on form and technique  Other Prone Exercises glut kick backs: 2 sets of 10 reps with assist to maintain knee flexion for good ex form.            PT Short Term Goals - 01/10/16 1700      PT SHORT TERM GOAL #1   Title Pt will be independent and verbalize understanding of initial HEP to continue progress made in therapy. (Target Date for all STGs: 02/07/16)   Time 4   Period Weeks   Status New     PT SHORT TERM GOAL #2   Title Pt will improve FGA to 18/30 to indicate improved functional gait and mobility.   Time 4   Period Weeks   Status New     PT SHORT TERM GOAL #3   Title Pt will improve 5 times sit to stand to < or = 12 sec indicating a decr risk of falls.   Time 4   Period Weeks   Status New     PT SHORT TERM GOAL #4   Title Pt will negotiate 8 stairs with supervision and no  UE support to incr safety negotiating stairs at home.   Time 4   Period Weeks   Status New     PT SHORT TERM GOAL #5   Title Pt will ambulate 500' with mod I over level surfaces, ramps, and curbs to incr safey ambulating at home.   Time 4   Period Weeks   Status New           PT Long Term Goals - 01/10/16 1705      PT LONG TERM GOAL #1   Title Pt will be independent and verbalize understanding of HEP and on-going fitness plan to maintain progress made in therapy and his active lifestyle. (Target Date for all LTGs: 03/06/16)   Time 8   Period Weeks   Status New     PT LONG TERM GOAL #2    Title Pt will improve FGA score to > or = 23/30 to indicate decr fall risk and improved functional mobility.   Time 8   Period Weeks   Status New     PT LONG TERM GOAL #3   Title Pt will improve gait speed to > 2.62 ft/sec to indicate status of community ambulator.   Time 8   Period Weeks   Status New     PT LONG TERM GOAL #4   Title Pt will negotiate 15 stairs with mod I and no UE support to improve safety with stairs at home.   Time 8   Period Weeks   Status New     PT LONG TERM GOAL #5   Title Pt will ambulate 102ft outdoors over unlevel surfaces, pavement, gravel, grass, ramps, and curbs with mod I to incr safety while ambulating in the community.   Time 8   Period Weeks   Status New        01/23/16 0935  Plan  Clinical Impression Statement Today's skilled session focused on right LE strengthening and dynamic balance activities. Pt challanged with complaint surfaces, needing increased assistance for balance. Pt is making steady progress toward goals and should benefit from continued PT to progress toward umet goals.                                        Pt will benefit from skilled therapeutic intervention in order to improve on the following deficits Abnormal gait;Decreased activity tolerance;Decreased knowledge of precautions;Decreased endurance;Decreased strength;Impaired perceived functional ability;Impaired sensation;Improper body mechanics;Impaired UE functional use;Decreased coordination  Rehab Potential Excellent  Clinical Impairments Affecting Rehab Potential HTN, Type 2 DM  PT Frequency 2x / week  PT Duration 8 weeks (Expect pt will DC after 6 weeks)  PT Treatment/Interventions ADLs/Self Care Home Management;Functional mobility training;Stair training;Gait training;Therapeutic activities;Therapeutic exercise;Balance training;Neuromuscular re-education;Patient/family education;Manual techniques;Energy conservation  PT Next Visit Plan begin functional gait and  balance activites; strengthen R LE  PT Home Exercise Plan bridging, water activites, sit<>stands  Consulted and Agree with Plan of Care Patient;Family member/caregiver  Family Member Consulted Wife          Patient will benefit from skilled therapeutic intervention in order to improve the following deficits and impairments:  Abnormal gait, Decreased activity tolerance, Decreased knowledge of precautions, Decreased endurance, Decreased strength, Impaired perceived functional ability, Impaired sensation, Improper body mechanics, Impaired UE functional use, Decreased coordination  Visit Diagnosis: Hemiplegia and hemiparesis following cerebral infarction affecting right dominant side (HCC)  Muscle weakness (generalized)  Unsteadiness on feet  Other abnormalities of gait and mobility     Problem List Patient Active Problem List   Diagnosis Date Noted  . Acute CVA (cerebrovascular accident) ([place]) 01/05/2016  . Right sided weakness 01/05/2016  . Dyslipidemia associated with type 2 diabetes mellitus ([place]) 01/05/2016  . Hypokalemia 01/05/2016  . Type 2 diabetes mellitus with vascular disease ([place]) 10/21/2006    Willow Ora, PTA, [facility] [address], [place], [place] [address] [phone] 01/24/16, 1:15 PM   Name: Christian Clements MRN: [id number] Date of Birth: [birth year]

## 2016-01-24 NOTE — Therapy (Signed)
[facility] [address] [place], [place], [address] Phone: [phone]   Fax:  [phone]  Occupational Therapy Treatment  Patient Details  Name: Christian Clements MRN: [id number] Date of Birth: 20-Feb-1951 Referring Provider: Dr Rosalin Hawking  Encounter Date: 01/24/2016      OT End of Session - 01/24/16 1146    Visit Number 5   Number of Visits 16   Date for OT Re-Evaluation 03/05/16   Authorization Type [employer]   Authorization - Visit Number 5   Authorization - Number of Visits 10   OT Start Time [phone]   OT Stop Time 0933   OT Time Calculation (min) 42 min   Activity Tolerance Patient tolerated treatment well   Behavior During Therapy [facility] for tasks assessed/performed      Past Medical History:  Diagnosis Date  . Diabetes mellitus   . Hypertension   . Prostatitis 2011    History reviewed. No pertinent surgical history.  There were no vitals filed for this visit.      Subjective Assessment - 01/24/16 1144    Subjective  Pt denies pain, Pt reports frustration with difficulty signing his name when out at a resturant this last week.   Patient is accompained by: Family member   Pertinent History see Epic   Currently in Pain? No/denies   Pain Score 0-No pain                      OT Treatments/Exercises (OP) - 01/24/16 0001      Exercises   Exercises Neurological Re-education     Fine Motor Coordination   Other Fine Motor Exercises Reviewed HEP for FM Control; RUE removing graded clothes pins from vertical surface.w/ moderate difficulty, vc's for shoulder positioning.      Visual/Perceptual Exercises   Copy this Image Pegboard   Pegboard Copying small pegboard design with RUE for increased FM/coord. Min vc's for positioning.   Other Exercises Handwriting, signing name, copying, writing address with various grips on pen. Discussed writing strategies and things  that he can do at home as well.      Neurological Re-education Exercises   Reciprocal Movements UBE level 4 x8 min focusing on maintinaing RPM's and bilateral UE equally             Balance Exercises - 01/23/16 1005      Balance Exercises: Standing   SLS with Vectors Foam/compliant surface;Other reps (comment);Limitations     Balance Exercises: Standing   SLS with Vectors Limitations 6 cones along edge of red mats: alternating toe taps to each with side stepping left<>right x 2 laps each way with min guard to min assist for balance. cues on posture, stance position and weight shifting for stance stability.           OT Education - 01/24/16 1145    Education provided Yes   Education Details Writing strategies, increased grip for Pen, practice copying shapes etc worksheets. Cont with FM coord ex's as previously issued   Person(s) Educated Patient;Spouse   Methods Explanation;Demonstration;Verbal cues   Comprehension Verbalized understanding;Returned demonstration          OT Short Term Goals - 01/09/16 1124      OT SHORT TERM GOAL #1   Title Pt will be Mod I HEP for AROM and coordination RUE   Time 4   Period Weeks   Status New     OT SHORT TERM GOAL #  2   Title Pt will demonstrate improved grip strength by 5# or more RUE as seen by JAMAR geip assessment   Time 4   Period Weeks   Status New     OT SHORT TERM GOAL #3   Title Pt will be Mod I stating 3 possible signs/symptoms/risk factors of CVA   Time 4   Period Weeks   Status New           OT Long Term Goals - 01/09/16 1126      OT LONG TERM GOAL #1   Title Pt will be Mod I upgraded HEP for RUE   Time 8   Period Weeks   Status New     OT LONG TERM GOAL #2   Title Pt will be Mod I simple meal/snack prep in ADL kitchen while maintaining safety   Time 8   Period Weeks   Status New     OT LONG TERM GOAL #3   Title Pt will demonstrate AROM RUE WNL's w/o c/o joint tightness at end range for shoulder  flexion (as compared to LUE)   Time 8   Period Weeks   Status New     OT LONG TERM GOAL #4   Title Pt will demonstrate improved MMT RUE of 4/5 or greater   Baseline MMT grossly 3/5 at eval on 01/09/16   Time 8   Period Weeks   Status New     OT LONG TERM GOAL #5   Title Pt will demonstrate improved grip strength RUE to 60# or greater RUE   Baseline See Eval 01/09/16   Time 8   Period Weeks   Status New     Long Term Additional Goals   Additional Long Term Goals Yes     OT LONG TERM GOAL #6   Title Pt will demonstrate improved 9 Hole Peg test score R UE to less than 1 min for improved coordination & functional hand use   Baseline 01/09/16 1 min 43 seconds   Time 8   Period Weeks   Status New               Plan - 01/24/16 1147    Clinical Impression Statement Pt is progressing toward RUE functional use and coordination. He expressed frustration with difficulty related to handwriting and signing his name.   Rehab Potential Good   OT Frequency 2x / week   OT Duration 8 weeks   OT Treatment/Interventions Self-care/ADL training;Fluidtherapy;DME and/or AE instruction;Splinting;Patient/family education;Balance training;Therapeutic exercises;Therapeutic exercise;Therapeutic activities;Cognitive remediation/compensation;Functional Mobility Training;Neuromuscular education;Manual Therapy;Visual/perceptual remediation/compensation   Plan Functional use RUE.   Consulted and Agree with Plan of Care Patient;Family member/caregiver   Family Member Consulted Spouse      Patient will benefit from skilled therapeutic intervention in order to improve the following deficits and impairments:  Abnormal gait, Decreased coordination, Decreased range of motion, Impaired flexibility, Impaired sensation, Decreased activity tolerance, Cardiopulmonary status limiting activity, Decreased knowledge of precautions, Impaired UE functional use, Decreased knowledge of use of DME, Decreased balance,  Decreased mobility, Decreased strength, Impaired vision/preception, Decreased cognition  Visit Diagnosis: Hemiplegia and hemiparesis following cerebral infarction affecting right dominant side (HCC)  Muscle weakness (generalized)  Other lack of coordination    Problem List Patient Active Problem List   Diagnosis Date Noted  . Acute CVA (cerebrovascular accident) ([place]) 01/05/2016  . Right sided weakness 01/05/2016  . Dyslipidemia associated with type 2 diabetes mellitus ([place]) 01/05/2016  . Hypokalemia 01/05/2016  . Type  2 diabetes mellitus with vascular disease ([place]) 10/21/2006    Carlynn Herald Mayleen Borrero Ardath Sax, OTR/L 01/24/2016, 11:49 AM  [facility] [address] [place], [place], [address] Phone: [phone]   Fax:  [phone]  Name: Christian Clements MRN: [id number] Date of Birth: [birth year]

## 2016-01-24 NOTE — Therapy (Signed)
[facility] [address] [place], [place], [address] Phone: [phone]   Fax:  [phone]  Physical Therapy Treatment  Patient Details  Name: Christian Clements MRN: [id number] Date of Birth: [birth year] Referring Provider: Rosalin Hawking, MD  Encounter Date: 01/24/2016      PT End of Session - 01/24/16 0811    Visit Number 4   Number of Visits 17   Date for PT Re-Evaluation 03/10/16   Authorization Type [employer]   Authorization Time Period G-code every 10th visit   PT Start Time 0806   PT Stop Time 0846   PT Time Calculation (min) 40 min   Equipment Utilized During Treatment Gait belt   Activity Tolerance Patient tolerated treatment well   Behavior During Therapy [facility] for tasks assessed/performed      Past Medical History:  Diagnosis Date  . Diabetes mellitus   . Hypertension   . Prostatitis 2011    History reviewed. No pertinent surgical history.  There were no vitals filed for this visit.      Subjective Assessment - 01/24/16 0811    Subjective No new complaints. No falls to report. Reports no issues with HEP and has been going to the pool almost every day.    Patient is accompained by: Family member  spouse, Christian Clements   Pertinent History Likes to be called "[name]".  PMH significant: HTN and DM II,   Patient Stated Goals "I want to be able to move around and do things I did before I had this stroke. I like to golf and I work out 5-6 times per week."   Currently in Pain? No/denies   Pain Score 0-No pain            [facility] Adult PT Treatment/Exercise - 01/24/16 0812      Lumbar Exercises: Quadruped   Straight Leg Raise 10 reps;Limitations   Straight Leg Raises Limitations alternating leg lifts out and back to mat, min assist for balance, cues for ex form/technique   Opposite Arm/Leg Raise Right arm/Left leg;Left arm/Right leg;10 reps;Limitations   Opposite Arm/Leg Raise Limitations cues for correct  ex form/technqiue, assist needed for balance     Knee/Hip Exercises: Standing   Other Standing Knee Exercises sit<>stand with OH press using 2# weighted ball x 10 reps with cues for full upright posture and slow, controlled descent with sitting down.      Knee/Hip Exercises: Supine   Bridges Limitations with left leg propped up on stool, right single leg bridging, 5 sec holds each rep with cues on form/technique.    Single Leg Bridge AROM;Strengthening;Right;10 reps;2 sets     Knee/Hip Exercises: Prone   Straight Leg Raises AROM;Strengthening;Right;2 sets;10 reps;Limitations   Other Prone Exercises glut kick backs: 2 sets of 10 reps with assist to maintain knee flexion for good ex form.              Balance Exercises - 01/24/16 1304      Balance Exercises: Standing   Balance Beam standing with feet across blue balance beam with no UE support: alternating fwd stepping to floor and then back onto foam x 10 each side, alternating bwd stepping to floor and then back onto foam x 10 each leg, min assist for balance and cues on form and weight shifting to assist with balance.    Other Standing Exercises sit<>stand with feet on balance board, performed 10 reps each way on board. min guard to min assist for balance along  with cues on ex form and weight shifitng to assist with balance.             PT Short Term Goals - 01/10/16 1700      PT SHORT TERM GOAL #1   Title Pt will be independent and verbalize understanding of initial HEP to continue progress made in therapy. (Target Date for all STGs: 02/07/16)   Time 4   Period Weeks   Status New     PT SHORT TERM GOAL #2   Title Pt will improve FGA to 18/30 to indicate improved functional gait and mobility.   Time 4   Period Weeks   Status New     PT SHORT TERM GOAL #3   Title Pt will improve 5 times sit to stand to < or = 12 sec indicating a decr risk of falls.   Time 4   Period Weeks   Status New     PT SHORT TERM GOAL #4    Title Pt will negotiate 8 stairs with supervision and no UE support to incr safety negotiating stairs at home.   Time 4   Period Weeks   Status New     PT SHORT TERM GOAL #5   Title Pt will ambulate 500' with mod I over level surfaces, ramps, and curbs to incr safey ambulating at home.   Time 4   Period Weeks   Status New           PT Long Term Goals - 01/10/16 1705      PT LONG TERM GOAL #1   Title Pt will be independent and verbalize understanding of HEP and on-going fitness plan to maintain progress made in therapy and his active lifestyle. (Target Date for all LTGs: 03/06/16)   Time 8   Period Weeks   Status New     PT LONG TERM GOAL #2   Title Pt will improve FGA score to > or = 23/30 to indicate decr fall risk and improved functional mobility.   Time 8   Period Weeks   Status New     PT LONG TERM GOAL #3   Title Pt will improve gait speed to > 2.62 ft/sec to indicate status of community ambulator.   Time 8   Period Weeks   Status New     PT LONG TERM GOAL #4   Title Pt will negotiate 15 stairs with mod I and no UE support to improve safety with stairs at home.   Time 8   Period Weeks   Status New     PT LONG TERM GOAL #5   Title Pt will ambulate 1086ft outdoors over unlevel surfaces, pavement, gravel, grass, ramps, and curbs with mod I to incr safety while ambulating in the community.   Time 8   Period Weeks   Status New            Plan - 01/24/16 [id number]    Clinical Impression Statement Today's skilled session continued to address right LE strengthening and balance activites. No issues reported other than fatigue of right leg. Pt is making steady progress toward goals and should benefit from continued PT to progress toward unmet goals.    Rehab Potential Excellent   Clinical Impairments Affecting Rehab Potential HTN, Type 2 DM   PT Frequency 2x / week   PT Duration 8 weeks  Expect pt will DC after 6 weeks   PT Treatment/Interventions ADLs/Self Care  Home Management;Functional mobility training;Stair training;Gait  training;Therapeutic activities;Therapeutic exercise;Balance training;Neuromuscular re-education;Patient/family education;Manual techniques;Energy conservation   PT Next Visit Plan continue functional gait and balance activites; strengthen R LE   PT Home Exercise Plan bridging, water activites, sit<>stands   Consulted and Agree with Plan of Care Patient;Family member/caregiver   Family Member Consulted Wife      Patient will benefit from skilled therapeutic intervention in order to improve the following deficits and impairments:  Abnormal gait, Decreased activity tolerance, Decreased knowledge of precautions, Decreased endurance, Decreased strength, Impaired perceived functional ability, Impaired sensation, Improper body mechanics, Impaired UE functional use, Decreased coordination  Visit Diagnosis: Hemiplegia and hemiparesis following cerebral infarction affecting right dominant side (HCC)  Muscle weakness (generalized)  Unsteadiness on feet  Other abnormalities of gait and mobility     Problem List Patient Active Problem List   Diagnosis Date Noted  . Acute CVA (cerebrovascular accident) ([place]) 01/05/2016  . Right sided weakness 01/05/2016  . Dyslipidemia associated with type 2 diabetes mellitus ([place]) 01/05/2016  . Hypokalemia 01/05/2016  . Type 2 diabetes mellitus with vascular disease ([place]) 10/21/2006    Willow Ora, PTA, [facility] [address], [place], [place] [address] [phone] 01/24/16, 1:08 PM   Name: Amy Usher MRN: [id number] Date of Birth: 08/27/1950

## 2016-01-28 ENCOUNTER — Inpatient Hospital Stay: Payer: Medicare Other | Admitting: Internal Medicine

## 2016-01-29 ENCOUNTER — Ambulatory Visit: Payer: Medicare Other | Admitting: Physical Therapy

## 2016-01-29 ENCOUNTER — Ambulatory Visit: Payer: Medicare Other | Admitting: Occupational Therapy

## 2016-01-29 ENCOUNTER — Encounter: Payer: Self-pay | Admitting: Physical Therapy

## 2016-01-29 DIAGNOSIS — R2681 Unsteadiness on feet: Secondary | ICD-10-CM

## 2016-01-29 DIAGNOSIS — M6281 Muscle weakness (generalized): Secondary | ICD-10-CM

## 2016-01-29 DIAGNOSIS — I69351 Hemiplegia and hemiparesis following cerebral infarction affecting right dominant side: Secondary | ICD-10-CM

## 2016-01-29 DIAGNOSIS — R2689 Other abnormalities of gait and mobility: Secondary | ICD-10-CM

## 2016-01-29 DIAGNOSIS — R278 Other lack of coordination: Secondary | ICD-10-CM

## 2016-01-29 NOTE — Therapy (Signed)
[facility] [address] [place], [place], [address] Phone: [phone]   Fax:  [phone]  Occupational Therapy Treatment  Patient Details  Name: Christian Clements MRN: [id number] Date of Birth: 1950/11/04 Referring Provider: Dr Rosalin Hawking  Encounter Date: 01/29/2016      OT End of Session - 01/29/16 0923    Visit Number 6   Number of Visits 16   Date for OT Re-Evaluation 03/05/16   Authorization Type [place] Medicare   Authorization - Visit Number 6   Authorization - Number of Visits 10   OT Start Time [phone]   OT Stop Time 0930   OT Time Calculation (min) 39 min   Activity Tolerance Patient tolerated treatment well   Behavior During Therapy [facility] for tasks assessed/performed      Past Medical History:  Diagnosis Date  . Diabetes mellitus   . Hypertension   . Prostatitis 2011    No past surgical history on file.  There were no vitals filed for this visit.      Subjective Assessment - 01/29/16 0852    Subjective  denies pain   Pertinent History see Epic   Currently in Pain? No/denies           Treatment: supine shoulder flexion and chest press with bilateral UE's holding medium ball, 15 reps each, min facilitation/ v.c. Seated closed chain shoulder flexion, chest press with ball, therapist facilitating right scapula/ shoulder positioning,  AA/ROM shoulder flexion with hemiglide , min v.c. Facilitation Copying small peg design with RUE  for increased fine motor coordination, then removing with in hand manipulation, min v.c. To avoid compensation.                  OT Short Term Goals - 01/09/16 1124      OT SHORT TERM GOAL #1   Title Pt will be Mod I HEP for AROM and coordination RUE   Time 4   Period Weeks   Status New     OT SHORT TERM GOAL #2   Title Pt will demonstrate improved grip strength by 5# or more RUE as seen by JAMAR geip assessment   Time 4   Period Weeks   Status New     OT SHORT TERM GOAL #3   Title Pt will be Mod I stating 3 possible signs/symptoms/risk factors of CVA   Time 4   Period Weeks   Status New           OT Long Term Goals - 01/09/16 1126      OT LONG TERM GOAL #1   Title Pt will be Mod I upgraded HEP for RUE   Time 8   Period Weeks   Status New     OT LONG TERM GOAL #2   Title Pt will be Mod I simple meal/snack prep in ADL kitchen while maintaining safety   Time 8   Period Weeks   Status New     OT LONG TERM GOAL #3   Title Pt will demonstrate AROM RUE WNL's w/o c/o joint tightness at end range for shoulder flexion (as compared to LUE)   Time 8   Period Weeks   Status New     OT LONG TERM GOAL #4   Title Pt will demonstrate improved MMT RUE of 4/5 or greater   Baseline MMT grossly 3/5 at eval on 01/09/16   Time 8   Period Weeks   Status New  OT LONG TERM GOAL #5   Title Pt will demonstrate improved grip strength RUE to 60# or greater RUE   Baseline See Eval 01/09/16   Time 8   Period Weeks   Status New     Long Term Additional Goals   Additional Long Term Goals Yes     OT LONG TERM GOAL #6   Title Pt will demonstrate improved 9 Hole Peg test score R UE to less than 1 min for improved coordination & functional hand use   Baseline 01/09/16 1 min 43 seconds   Time 8   Period Weeks   Status New               Plan - 01/29/16 0920    Clinical Impression Statement Pt is progressing towards goals for RUE strength and functional use. He fatigues quickly with A/ROM, AA/ROM shoulder flexion.   Rehab Potential Good   OT Frequency 2x / week   OT Duration 8 weeks   OT Treatment/Interventions Self-care/ADL training;Fluidtherapy;DME and/or AE instruction;Splinting;Patient/family education;Balance training;Therapeutic exercises;Therapeutic exercise;Therapeutic activities;Cognitive remediation/compensation;Functional Mobility Training;Neuromuscular education;Manual  Therapy;Visual/perceptual remediation/compensation   Plan RUE functional use   Consulted and Agree with Plan of Care Patient   Family Member Consulted Spouse      Patient will benefit from skilled therapeutic intervention in order to improve the following deficits and impairments:  Abnormal gait, Decreased coordination, Decreased range of motion, Impaired flexibility, Impaired sensation, Decreased activity tolerance, Cardiopulmonary status limiting activity, Decreased knowledge of precautions, Impaired UE functional use, Decreased knowledge of use of DME, Decreased balance, Decreased mobility, Decreased strength, Impaired vision/preception, Decreased cognition  Visit Diagnosis: Hemiplegia and hemiparesis following cerebral infarction affecting right dominant side (HCC)  Muscle weakness (generalized)  Unsteadiness on feet  Other lack of coordination    Problem List Patient Active Problem List   Diagnosis Date Noted  . Acute CVA (cerebrovascular accident) ([place]) 01/05/2016  . Right sided weakness 01/05/2016  . Dyslipidemia associated with type 2 diabetes mellitus ([place]) 01/05/2016  . Hypokalemia 01/05/2016  . Type 2 diabetes mellitus with vascular disease ([place]) 10/21/2006    Clements,Christian 01/29/2016, 9:24 AM Theone Murdoch, OTR/L [phone] Phone: [phone] 9:29 AM 01/29/16 [facility] [address] [place], [place], [address] Phone: [phone]   Fax:  [phone]  Name: Christian Clements MRN: [id number] Date of Birth: 21-Jun-1950

## 2016-01-29 NOTE — Therapy (Signed)
[facility] [address] [place], [place], [address] Phone: [phone]   Fax:  [phone]  Physical Therapy Treatment  Patient Details  Name: Christian Clements MRN: [id number] Date of Birth: 1950/04/19 Referring Provider: Rosalin Hawking, MD  Encounter Date: 01/29/2016      PT End of Session - 01/29/16 0808    Visit Number 5   Number of Visits 17   Date for PT Re-Evaluation 03/10/16   Authorization Type [employer] Medicare   Authorization Time Period G-code every 10th visit   PT Start Time 0805   PT Stop Time 0845   PT Time Calculation (min) 40 min   Equipment Utilized During Treatment Gait belt   Activity Tolerance Patient tolerated treatment well   Behavior During Therapy [facility] for tasks assessed/performed      Past Medical History:  Diagnosis Date  . Diabetes mellitus   . Hypertension   . Prostatitis 2011    History reviewed. No pertinent surgical history.  There were no vitals filed for this visit.      Subjective Assessment - 01/29/16 0807    Subjective No new complaints. No falls or pain to report.    Patient is accompained by: Family member  spouse, Christian Clements   Pertinent History Likes to be called "[name]".  PMH significant: HTN and DM II,   Patient Stated Goals "I want to be able to move around and do things I did before I had this stroke. I like to golf and I work out 5-6 times per week."   Currently in Pain? No/denies   Pain Score 0-No pain            [facility] Adult PT Treatment/Exercise - 01/29/16 0808      Transfers   Transfers Sit to Stand;Stand to Sit   Sit to Stand 5: Supervision;Without upper extremity assist;From bed   Stand to Sit 5: Supervision;Without upper extremity assist;To bed     Ambulation/Gait   Ambulation/Gait Yes   Ambulation/Gait Assistance 5: Supervision;4: Min guard   Ambulation/Gait Assistance Details min guard assist on outdoor complaint surfaces, supervision otherwise. Pt  continues to demo decreased stance on the right side. no overt loss of balance noted with any gait.    Ambulation Distance (Feet) 500 Feet   Assistive device None   Gait Pattern Step-through pattern;Decreased stride length;Decreased hip/knee flexion - right;Decreased hip/knee flexion - left;Trunk flexed;Poor foot clearance - left;Poor foot clearance - right   Ambulation Surface Level;Unlevel;Indoor;Outdoor;Paved;Gravel;Grass     High Level Balance   High Level Balance Activities Marching forwards;Marching backwards;Tandem walking  tandem walking fwd/bwd   High Level Balance Comments on red/blue mats combined: 3 laps each/each way with min guard assist to min assist for balance and cues on posture, weight shifting and ex form/technique.     Knee/Hip Exercises: Supine   Bridges AROM;Strengthening;Both;1 set;10 reps;Limitations  5 sec holds   Bridges Limitations with left leg propped up on stool, right single leg bridging, 5 sec holds each rep with cues on form/technique.    Single Leg Bridge AROM;Strengthening;Right;10 reps;Limitations;2 sets  5 sec holds     Knee/Hip Exercises: Prone   Straight Leg Raises AROM;Strengthening;Right;2 sets;10 reps;Limitations   Straight Leg Raises Limitations cues on form and technique   Other Prone Exercises glut kick backs: 2 sets of 10 reps with assist to maintain knee flexion for good ex form.              Balance Exercises - 01/29/16 0914  Balance Exercises: Standing   SLS with Vectors Foam/compliant surface;Other reps (comment);Limitations   Rockerboard Anterior/posterior;Lateral;Head turns;EO;EC;20 seconds;10 reps     Balance Exercises: Standing   SLS with Vectors Limitations 2 tall cones on red mat: alternating fwd toe taps to each, alternating cross toe taps to each, alternating fwd double toe taps to each and alternating cross double toe taps toe each. 10 x each bil legs with min to min guard assist for balance, cues on stance position  and weight shifting to assist with balance.                           Rebounder Limitations performed on large balance board facing both directions: EO rocking board with emphasis on tall posture; holding board steady EC no head movements, EC head movements up<>down and left<>right. min to mod assist for balance with cues on posture, equal weight bearing/weight shifting for improved balance. Multiple balance losses in all directions with eyes closed.                                        PT Short Term Goals - 01/10/16 1700      PT SHORT TERM GOAL #1   Title Pt will be independent and verbalize understanding of initial HEP to continue progress made in therapy. (Target Date for all STGs: 02/07/16)   Time 4   Period Weeks   Status New     PT SHORT TERM GOAL #2   Title Pt will improve FGA to 18/30 to indicate improved functional gait and mobility.   Time 4   Period Weeks   Status New     PT SHORT TERM GOAL #3   Title Pt will improve 5 times sit to stand to < or = 12 sec indicating a decr risk of falls.   Time 4   Period Weeks   Status New     PT SHORT TERM GOAL #4   Title Pt will negotiate 8 stairs with supervision and no UE support to incr safety negotiating stairs at home.   Time 4   Period Weeks   Status New     PT SHORT TERM GOAL #5   Title Pt will ambulate 500' with mod I over level surfaces, ramps, and curbs to incr safey ambulating at home.   Time 4   Period Weeks   Status New           PT Long Term Goals - 01/10/16 1705      PT LONG TERM GOAL #1   Title Pt will be independent and verbalize understanding of HEP and on-going fitness plan to maintain progress made in therapy and his active lifestyle. (Target Date for all LTGs: 03/06/16)   Time 8   Period Weeks   Status New     PT LONG TERM GOAL #2   Title Pt will improve FGA score to > or = 23/30 to indicate decr fall risk and improved functional mobility.   Time 8   Period Weeks   Status New     PT LONG  TERM GOAL #3   Title Pt will improve gait speed to > 2.62 ft/sec to indicate status of community ambulator.   Time 8   Period Weeks   Status New     PT LONG TERM GOAL #4   Title Pt will  negotiate 15 stairs with mod I and no UE support to improve safety with stairs at home.   Time 8   Period Weeks   Status New     PT LONG TERM GOAL #5   Title Pt will ambulate 101ft outdoors over unlevel surfaces, pavement, gravel, grass, ramps, and curbs with mod I to incr safety while ambulating in the community.   Time 8   Period Weeks   Status New            Plan - 01/29/16 [id number]    Clinical Impression Statement today's skilled session continued to address high level balance/gait and strengthening. No complaints except of fatigue. Pt continues to be most challenged on complaint surfaces and with single leg stance activities. Pt is making steady progress toward goals and should benefit from continued PT to progress toward unmet goals.    Rehab Potential Excellent   Clinical Impairments Affecting Rehab Potential HTN, Type 2 DM   PT Frequency 2x / week   PT Duration 8 weeks  Expect pt will DC after 6 weeks   PT Treatment/Interventions ADLs/Self Care Home Management;Functional mobility training;Stair training;Gait training;Therapeutic activities;Therapeutic exercise;Balance training;Neuromuscular re-education;Patient/family education;Manual techniques;Energy conservation   PT Next Visit Plan continue functional gait and balance activites; strengthen R LE   PT Home Exercise Plan bridging, water activites, sit<>stands   Consulted and Agree with Plan of Care Patient;Family member/caregiver   Family Member Consulted Wife      Patient will benefit from skilled therapeutic intervention in order to improve the following deficits and impairments:  Abnormal gait, Decreased activity tolerance, Decreased knowledge of precautions, Decreased endurance, Decreased strength, Impaired perceived functional  ability, Impaired sensation, Improper body mechanics, Impaired UE functional use, Decreased coordination  Visit Diagnosis: Hemiplegia and hemiparesis following cerebral infarction affecting right dominant side (HCC)  Muscle weakness (generalized)  Unsteadiness on feet  Other lack of coordination  Other abnormalities of gait and mobility     Problem List Patient Active Problem List   Diagnosis Date Noted  . Acute CVA (cerebrovascular accident) ([place]) 01/05/2016  . Right sided weakness 01/05/2016  . Dyslipidemia associated with type 2 diabetes mellitus ([place]) 01/05/2016  . Hypokalemia 01/05/2016  . Type 2 diabetes mellitus with vascular disease ([place]) 10/21/2006    Willow Ora, PTA, [facility] [address] [place], [place] [address] [phone] 01/29/16, 1:32 PM   Name: Nyles Aldridge MRN: [id number] Date of Birth: 12/04/1950

## 2016-01-31 ENCOUNTER — Encounter: Payer: Self-pay | Admitting: Physical Therapy

## 2016-01-31 ENCOUNTER — Ambulatory Visit: Payer: Medicare Other | Admitting: Physical Therapy

## 2016-01-31 ENCOUNTER — Ambulatory Visit: Payer: Medicare Other | Admitting: Occupational Therapy

## 2016-01-31 DIAGNOSIS — R2681 Unsteadiness on feet: Secondary | ICD-10-CM

## 2016-01-31 DIAGNOSIS — I69351 Hemiplegia and hemiparesis following cerebral infarction affecting right dominant side: Secondary | ICD-10-CM

## 2016-01-31 DIAGNOSIS — R278 Other lack of coordination: Secondary | ICD-10-CM

## 2016-01-31 DIAGNOSIS — M6281 Muscle weakness (generalized): Secondary | ICD-10-CM

## 2016-01-31 DIAGNOSIS — R2689 Other abnormalities of gait and mobility: Secondary | ICD-10-CM

## 2016-01-31 NOTE — Therapy (Signed)
[facility] [address] [place], [place], [address] Phone: [phone]   Fax:  [phone]  Occupational Therapy Treatment  Patient Details  Name: Roylee Loffredo MRN: [id number] Date of Birth: Aug 22, 1950 Referring Provider: Dr Rosalin Hawking  Encounter Date: 01/31/2016      OT End of Session - 01/31/16 0938    Visit Number 7   Number of Visits 16   Date for OT Re-Evaluation 03/05/16   Authorization Type [place] [employer]   Authorization - Visit Number 7   Authorization - Number of Visits 10   OT Start Time 0845   OT Stop Time 0925   OT Time Calculation (min) 40 min   Activity Tolerance Patient tolerated treatment well      Past Medical History:  Diagnosis Date  . Diabetes mellitus   . Hypertension   . Prostatitis 2011    No past surgical history on file.  There were no vitals filed for this visit.      Subjective Assessment - 01/31/16 0849    Subjective  My shoulder feels better than on Tuesday   Patient is accompained by: Family member   Pertinent History see Epic   Currently in Pain? No/denies                      OT Treatments/Exercises (OP) - 01/31/16 0001      ADLs   Writing Pt writing short paragraph with built up pen with approx. 70% legibility. Pt reports not far from handwriting/legibility previous to CVA     Neurological Re-education Exercises   Other Exercises 1 Supine: shoulder flexion with weighted ball high level with min cues for control RUE. Followed by same activity seated to mid level with lighter ball and min facilitation to prevent sh. abduction and IR.    Other Exercises 2 Closed chain high level reaching with cues to prevent Rt sh. compensation. Progressed to control small circumduction movements at 90* sh. flexion with ball along wall with fatigue and mod cues to correct position   Other Weight-Bearing Exercises 1 Quadraped: A-P wt shifts,  followed by cat/cow stretch for scapula stability. Pt required min cues for elbow extension and demo min to mod difficulty. Pt also noted to have limited trunk mobility   Other Weight-Bearing Exercises 2 Standing: modified push ups at wall for scapula stability with min compensations when fatigued                  OT Short Term Goals - 01/09/16 1124      OT SHORT TERM GOAL #1   Title Pt will be Mod I HEP for AROM and coordination RUE   Time 4   Period Weeks   Status New     OT SHORT TERM GOAL #2   Title Pt will demonstrate improved grip strength by 5# or more RUE as seen by JAMAR geip assessment   Time 4   Period Weeks   Status New     OT SHORT TERM GOAL #3   Title Pt will be Mod I stating 3 possible signs/symptoms/risk factors of CVA   Time 4   Period Weeks   Status New           OT Long Term Goals - 01/09/16 1126      OT LONG TERM GOAL #1   Title Pt will be Mod I upgraded HEP for RUE   Time 8   Period Weeks  Status New     OT LONG TERM GOAL #2   Title Pt will be Mod I simple meal/snack prep in ADL kitchen while maintaining safety   Time 8   Period Weeks   Status New     OT LONG TERM GOAL #3   Title Pt will demonstrate AROM RUE WNL's w/o c/o joint tightness at end range for shoulder flexion (as compared to LUE)   Time 8   Period Weeks   Status New     OT LONG TERM GOAL #4   Title Pt will demonstrate improved MMT RUE of 4/5 or greater   Baseline MMT grossly 3/5 at eval on 01/09/16   Time 8   Period Weeks   Status New     OT LONG TERM GOAL #5   Title Pt will demonstrate improved grip strength RUE to 60# or greater RUE   Baseline See Eval 01/09/16   Time 8   Period Weeks   Status New     Long Term Additional Goals   Additional Long Term Goals Yes     OT LONG TERM GOAL #6   Title Pt will demonstrate improved 9 Hole Peg test score R UE to less than 1 min for improved coordination & functional hand use   Baseline 01/09/16 1 min 43 seconds    Time 8   Period Weeks   Status New               Plan - 01/31/16 [id number]    Clinical Impression Statement Pt with increased awareness into RUE correct positioning. Pt fatigues quickly RUE and noted slight scapula weakness/hypermobility   Rehab Potential Good   OT Frequency 2x / week   OT Duration 8 weeks   OT Treatment/Interventions Self-care/ADL training;Fluidtherapy;DME and/or AE instruction;Splinting;Patient/family education;Balance training;Therapeutic exercises;Therapeutic exercise;Therapeutic activities;Cognitive remediation/compensation;Functional Mobility Training;Neuromuscular education;Manual Therapy;Visual/perceptual remediation/compensation   Plan Begin addressing STG's, continue NMR and functional reaching RUE   Consulted and Agree with Plan of Care Patient      Patient will benefit from skilled therapeutic intervention in order to improve the following deficits and impairments:  Abnormal gait, Decreased coordination, Decreased range of motion, Impaired flexibility, Impaired sensation, Decreased activity tolerance, Cardiopulmonary status limiting activity, Decreased knowledge of precautions, Impaired UE functional use, Decreased knowledge of use of DME, Decreased balance, Decreased mobility, Decreased strength, Impaired vision/preception, Decreased cognition  Visit Diagnosis: Hemiplegia and hemiparesis following cerebral infarction affecting right dominant side (HCC)  Muscle weakness (generalized)  Other lack of coordination    Problem List Patient Active Problem List   Diagnosis Date Noted  . Acute CVA (cerebrovascular accident) ([place]) 01/05/2016  . Right sided weakness 01/05/2016  . Dyslipidemia associated with type 2 diabetes mellitus ([place]) 01/05/2016  . Hypokalemia 01/05/2016  . Type 2 diabetes mellitus with vascular disease ([place]) 10/21/2006    Carey Bullocks, OTR/L 01/31/2016, 9:41 AM  [facility] [address] [place], [place], [address] Phone: [phone]   Fax:  [phone]  Name: Keil Corazza MRN: [id number] Date of Birth: 12-17-1950

## 2016-01-31 NOTE — Therapy (Signed)
[facility] [address] [place], [place], [address] Phone: [phone]   Fax:  [phone]  Physical Therapy Treatment  Patient Details  Name: Christian Clements MRN: [id number] Date of Birth: 10/16/1950 Referring Provider: Rosalin Hawking, MD  Encounter Date: 01/31/2016      PT End of Session - 01/31/16 0907    Visit Number 6   Number of Visits 17   Date for PT Re-Evaluation 03/10/16   Authorization Type [employer] Medicare   Authorization Time Period G-code every 10th visit   PT Start Time 0810  pt arrived late   PT Stop Time 0845   PT Time Calculation (min) 35 min   Equipment Utilized During Treatment Gait belt   Activity Tolerance Patient tolerated treatment well   Behavior During Therapy [facility] for tasks assessed/performed      Past Medical History:  Diagnosis Date  . Diabetes mellitus   . Hypertension   . Prostatitis 2011    History reviewed. No pertinent surgical history.  There were no vitals filed for this visit.      Subjective Assessment - 01/31/16 0812    Subjective Patient reports he went to the golf course with a few friends. He road in the cart most of the way. When attempting to walk on the grass he was unsteady and unable to follow through with swinging the golf club.   Patient is accompained by: Family member   Pertinent History Likes to be called "[name]".  PMH significant: HTN and DM II,   Patient Stated Goals "I want to be able to move around and do things I did before I had this stroke. I like to golf and I work out 5-6 times per week."   Currently in Pain? No/denies   Pain Score 0-No pain           [facility] Adult PT Treatment/Exercise - 01/31/16 0812      Exercises   Other Exercises  sit<>stand with red theraband around knees for gluteal activation; VC for proper technique; 10 reps x3; supervision     Knee/Hip Exercises: Supine   Single Leg Bridge AROM;Strengthening;Right;10  reps;Limitations;2 sets     Knee/Hip Exercises: Prone   Other Prone Exercises glut kick backs: 2 sets of 10 reps with assist to maintain knee flexion for good ex form.            Balance Exercises - 01/31/16 0857      Balance Exercises: Standing   Standing Eyes Opened Narrow base of support (BOS);Foam/compliant surface;3 reps;30 secs;Limitations;Head turns   Standing Eyes Closed Narrow base of support (BOS);3 reps;30 secs;Limitations   SLS with Vectors Solid surface;3 reps     Balance Exercises: Standing   Standing Eyes Opened Limitations Narrow BOS; on compliant surface; head turns progressing to head nods; 3x30 seconds; min guard.   Standing Eyes Closed Limitations Narrow BOS; on compliant surface; static standing 3x 30 seconds; min guard; VC for posture.   SLS with Vectors Limitations 5 foam bubbles in semi-circle around pt; Tapping to bubble and returning to center before tapping to next; begining with single toe taps and progressing to double; min assist; LOB x 4; increased dificulty when standing on RLU or when crossing over midline; 3x each.           PT Short Term Goals - 01/10/16 1700      PT SHORT TERM GOAL #1   Title Pt will be independent and verbalize understanding of initial HEP to  continue progress made in therapy. (Target Date for all STGs: 02/07/16)   Time 4   Period Weeks   Status New     PT SHORT TERM GOAL #2   Title Pt will improve FGA to 18/30 to indicate improved functional gait and mobility.   Time 4   Period Weeks   Status New     PT SHORT TERM GOAL #3   Title Pt will improve 5 times sit to stand to < or = 12 sec indicating a decr risk of falls.   Time 4   Period Weeks   Status New     PT SHORT TERM GOAL #4   Title Pt will negotiate 8 stairs with supervision and no UE support to incr safety negotiating stairs at home.   Time 4   Period Weeks   Status New     PT SHORT TERM GOAL #5   Title Pt will ambulate 500' with mod I over level  surfaces, ramps, and curbs to incr safey ambulating at home.   Time 4   Period Weeks   Status New           PT Long Term Goals - 01/10/16 1705      PT LONG TERM GOAL #1   Title Pt will be independent and verbalize understanding of HEP and on-going fitness plan to maintain progress made in therapy and his active lifestyle. (Target Date for all LTGs: 03/06/16)   Time 8   Period Weeks   Status New     PT LONG TERM GOAL #2   Title Pt will improve FGA score to > or = 23/30 to indicate decr fall risk and improved functional mobility.   Time 8   Period Weeks   Status New     PT LONG TERM GOAL #3   Title Pt will improve gait speed to > 2.62 ft/sec to indicate status of community ambulator.   Time 8   Period Weeks   Status New     PT LONG TERM GOAL #4   Title Pt will negotiate 15 stairs with mod I and no UE support to improve safety with stairs at home.   Time 8   Period Weeks   Status New     PT LONG TERM GOAL #5   Title Pt will ambulate 1058ft outdoors over unlevel surfaces, pavement, gravel, grass, ramps, and curbs with mod I to incr safety while ambulating in the community.   Time 8   Period Weeks   Status New           Plan - 01/31/16 0908    Clinical Impression Statement Todays skilled session focused on strengthening of the R LE and balance activities. Patient demonstrated significant dificulty in SLS balance when crossing over midline or when balancing on RLE.   Rehab Potential Excellent   Clinical Impairments Affecting Rehab Potential HTN, Type 2 DM   PT Frequency 2x / week   PT Duration 8 weeks  Expect pt will DC after 6 weeks   PT Treatment/Interventions ADLs/Self Care Home Management;Functional mobility training;Stair training;Gait training;Therapeutic activities;Therapeutic exercise;Balance training;Neuromuscular re-education;Patient/family education;Manual techniques;Energy conservation   PT Next Visit Plan continue functional gait and balance activites;  strengthen R LE. Progress to back to sport (golf).   PT Home Exercise Plan bridging, water activites, sit<>stands   Consulted and Agree with Plan of Care Patient;Family member/caregiver   Family Member Consulted Wife      Patient will benefit from skilled therapeutic  intervention in order to improve the following deficits and impairments:  Abnormal gait, Decreased activity tolerance, Decreased knowledge of precautions, Decreased endurance, Decreased strength, Impaired perceived functional ability, Impaired sensation, Improper body mechanics, Impaired UE functional use, Decreased coordination  Visit Diagnosis: Hemiplegia and hemiparesis following cerebral infarction affecting right dominant side (HCC)  Muscle weakness (generalized)  Unsteadiness on feet  Other lack of coordination  Other abnormalities of gait and mobility     Problem List Patient Active Problem List   Diagnosis Date Noted  . Acute CVA (cerebrovascular accident) ([place]) 01/05/2016  . Right sided weakness 01/05/2016  . Dyslipidemia associated with type 2 diabetes mellitus ([place]) 01/05/2016  . Hypokalemia 01/05/2016  . Type 2 diabetes mellitus with vascular disease ([place]) 10/21/2006    Benjiman Core, [place] 01/31/2016, 2:47 PM  [facility] [address] [place], [place], [address] Phone: [phone]   Fax:  [phone]  Name: Joevani Matczak MRN: [id number] Date of Birth: Aug 18, 1950

## 2016-02-04 ENCOUNTER — Encounter: Payer: Self-pay | Admitting: *Deleted

## 2016-02-04 ENCOUNTER — Ambulatory Visit: Payer: Medicare Other | Admitting: *Deleted

## 2016-02-04 ENCOUNTER — Ambulatory Visit: Payer: Medicare Other | Admitting: Physical Therapy

## 2016-02-04 DIAGNOSIS — M6281 Muscle weakness (generalized): Secondary | ICD-10-CM

## 2016-02-04 DIAGNOSIS — I69351 Hemiplegia and hemiparesis following cerebral infarction affecting right dominant side: Secondary | ICD-10-CM | POA: Diagnosis not present

## 2016-02-04 DIAGNOSIS — I69318 Other symptoms and signs involving cognitive functions following cerebral infarction: Secondary | ICD-10-CM

## 2016-02-04 DIAGNOSIS — R278 Other lack of coordination: Secondary | ICD-10-CM

## 2016-02-04 NOTE — Therapy (Signed)
[facility] [address] [place], [place], [address] Phone: [phone]   Fax:  [phone]  Occupational Therapy Treatment  Patient Details  Name: Christian Clements MRN: [id number] Date of Birth: 06/27/1950 Referring Provider: Dr Rosalin Hawking  Encounter Date: 02/04/2016      OT End of Session - 02/04/16 1231    Visit Number 8   Number of Visits 16   Date for OT Re-Evaluation 03/05/16   Authorization Type [employer] [place] Medicare   Authorization - Visit Number 8   Authorization - Number of Visits 10   OT Start Time 0801   OT Stop Time 0846   OT Time Calculation (min) 45 min   Activity Tolerance Patient tolerated treatment well   Behavior During Therapy [facility] for tasks assessed/performed      Past Medical History:  Diagnosis Date  . Diabetes mellitus   . Hypertension   . Prostatitis 2011    History reviewed. No pertinent surgical history.  There were no vitals filed for this visit.      Subjective Assessment - 02/04/16 0804    Subjective  My shoulder feels tight   Patient is accompained by: Family member   Pertinent History see Epic   Currently in Pain? No/denies   Pain Score 0-No pain   Multiple Pain Sites No                      OT Treatments/Exercises (OP) - 02/04/16 0001      Exercises   Exercises Neurological Re-education     Neurological Re-education Exercises   Other Exercises 1 Supine: shoulder flexion with weighted ball high level with min cues for control RUE. Followed by same activity seated to mid level with lighter ball and min facilitation to prevent sh. abduction and IR.    Seated with weight on hand Seated with weight bearing through RUE - vc's for elbow extension and true weight bearing and weight shifting required.    Other Weight-Bearing Exercises 1 Quadraped: A-P wt shifts, followed by cat/cow stretch for scapula stability. Pt required min cues for elbow  extension and demo min to mod difficulty. Modified push ups in quadraped x2 sets 10 with rest break between sets and vc's for positioning. Pt also noted to have limited trunk mobility.   Other Weight-Bearing Exercises 2 Standing: modified push ups at wall for scapula stability with min compensations when fatigued   Reciprocal Movements UBE level 4 x8 min w/ focus on mainting RPMs and reciprocal arm movements      Pt was instructed in possible signs and symptoms of CVA/Stroke today in clinic. Handout was issued and reviewed.           OT Education - 02/04/16 1236    Education provided Yes   Education Details Cont FM/coordination ex's; HEP reviewed and performed; Pt was instructed in possible signs and symptoms of CVA/Stroke today in clinic. Handout was issued and reviewed; Pt was also educated to d/c lifting weights at gym as he may be compensating due to lack of shoulder stability and scapular weakness/decreased strength.   Person(s) Educated Patient;Spouse   Methods Explanation;Demonstration;Verbal cues   Comprehension Verbalized understanding;Returned demonstration          OT Short Term Goals - 02/04/16 1238      OT SHORT TERM GOAL #1   Title Pt will be Mod I HEP for AROM and coordination RUE   Time 4   Period Weeks  Status Achieved     OT SHORT TERM GOAL #2   Title Pt will demonstrate improved grip strength by 5# or more RUE as seen by JAMAR geip assessment   Time 4   Period Weeks   Status On-going     OT SHORT TERM GOAL #3   Title Pt will be Mod I stating 3 possible signs/symptoms/risk factors of CVA   Time 4   Period Weeks   Status On-going           OT Long Term Goals - 01/09/16 1126      OT LONG TERM GOAL #1   Title Pt will be Mod I upgraded HEP for RUE   Time 8   Period Weeks   Status New     OT LONG TERM GOAL #2   Title Pt will be Mod I simple meal/snack prep in ADL kitchen while maintaining safety   Time 8   Period Weeks   Status New     OT  LONG TERM GOAL #3   Title Pt will demonstrate AROM RUE WNL's w/o c/o joint tightness at end range for shoulder flexion (as compared to LUE)   Time 8   Period Weeks   Status New     OT LONG TERM GOAL #4   Title Pt will demonstrate improved MMT RUE of 4/5 or greater   Baseline MMT grossly 3/5 at eval on 01/09/16   Time 8   Period Weeks   Status New     OT LONG TERM GOAL #5   Title Pt will demonstrate improved grip strength RUE to 60# or greater RUE   Baseline See Eval 01/09/16   Time 8   Period Weeks   Status New     Long Term Additional Goals   Additional Long Term Goals Yes     OT LONG TERM GOAL #6   Title Pt will demonstrate improved 9 Hole Peg test score R UE to less than 1 min for improved coordination & functional hand use   Baseline 01/09/16 1 min 43 seconds   Time 8   Period Weeks   Status New               Plan - 02/04/16 1237    Consulted and Agree with Plan of Care Patient   Family Member Consulted Spouse      Patient will benefit from skilled therapeutic intervention in order to improve the following deficits and impairments:  Abnormal gait, Decreased coordination, Decreased range of motion, Impaired flexibility, Impaired sensation, Decreased activity tolerance, Cardiopulmonary status limiting activity, Decreased knowledge of precautions, Impaired UE functional use, Decreased knowledge of use of DME, Decreased balance, Decreased mobility, Decreased strength, Impaired vision/preception, Decreased cognition  Visit Diagnosis: Muscle weakness (generalized)  Hemiplegia and hemiparesis following cerebral infarction affecting right dominant side (HCC)  Other lack of coordination  Other symptoms and signs involving cognitive functions following cerebral infarction    Problem List Patient Active Problem List   Diagnosis Date Noted  . Acute CVA (cerebrovascular accident) ([place]) 01/05/2016  . Right sided weakness 01/05/2016  . Dyslipidemia associated with  type 2 diabetes mellitus ([place]) 01/05/2016  . Hypokalemia 01/05/2016  . Type 2 diabetes mellitus with vascular disease ([place]) 10/21/2006    [place], Amy Ardath Sax, OTR/L 02/04/2016, 12:39 PM  [facility] [address] [place], [place], [address] Phone: [phone]   Fax:  [phone]  Name: Christian Clements MRN: [id number] Date of Birth:  11/04/1950  

## 2016-02-04 NOTE — Patient Instructions (Signed)
Pt was instructed in possible signs and symptoms of CVA/Stroke today in clinic. Handout was issued and reviewed.

## 2016-02-06 ENCOUNTER — Ambulatory Visit: Payer: Medicare Other | Admitting: Physical Therapy

## 2016-02-06 DIAGNOSIS — M6281 Muscle weakness (generalized): Secondary | ICD-10-CM

## 2016-02-06 DIAGNOSIS — I69351 Hemiplegia and hemiparesis following cerebral infarction affecting right dominant side: Secondary | ICD-10-CM

## 2016-02-06 DIAGNOSIS — R2689 Other abnormalities of gait and mobility: Secondary | ICD-10-CM

## 2016-02-06 DIAGNOSIS — R2681 Unsteadiness on feet: Secondary | ICD-10-CM

## 2016-02-06 DIAGNOSIS — R278 Other lack of coordination: Secondary | ICD-10-CM

## 2016-02-06 NOTE — Therapy (Signed)
[facility] [address] [place], [place], [address] Phone: [phone]   Fax:  [phone]  Physical Therapy Treatment  Patient Details  Name: Christian Clements MRN: [id number] Date of Birth: 26-Mar-1950 Referring Provider: Rosalin Hawking, MD  Encounter Date: 02/06/2016      PT End of Session - 02/06/16 0907    Visit Number 7   Number of Visits 17   Date for PT Re-Evaluation 03/10/16   Authorization Type [employer]   Authorization Time Period G-code every 10th visit   PT Start Time 0807  Pt arrived late to session.   PT Stop Time 0848   PT Time Calculation (min) 41 min   Activity Tolerance Patient tolerated treatment well   Behavior During Therapy WFL for tasks assessed/performed      Past Medical History:  Diagnosis Date  . Diabetes mellitus   . Hypertension   . Prostatitis 2011    No past surgical history on file.  There were no vitals filed for this visit.      Subjective Assessment - 02/06/16 0810    Subjective "I feel like I'm not progressing as quickly as I'd like to, but I think that's because my expectations are too high. I have more flexibility on my right side, and I'm stronger, too. I still have that littie hitch in my walk." Upon further questioning, note that "hitch" pt referring to is R knee hyperextension.    Patient is accompained by: Family member  wife   Pertinent History Likes to be called "Christian Clements".  PMH significant: HTN and DM II,   Patient Stated Goals "I want to be able to move around and do things I did before I had this stroke. I like to golf and I work out 5-6 times per week."   Currently in Pain? No/denies            [facility] PT Assessment - 02/06/16 0001      Functional Gait  Assessment   Gait assessed  Yes   Gait Level Surface Walks 20 ft in less than 5.5 sec, no assistive devices, good speed, no evidence for imbalance, normal gait pattern, deviates no more than 6 in outside  of the 12 in walkway width.   Change in Gait Speed Able to smoothly change walking speed without loss of balance or gait deviation. Deviate no more than 6 in outside of the 12 in walkway width.   Gait with Horizontal Head Turns Performs head turns smoothly with no change in gait. Deviates no more than 6 in outside 12 in walkway width   Gait with Vertical Head Turns Performs head turns with no change in gait. Deviates no more than 6 in outside 12 in walkway width.   Gait and Pivot Turn Pivot turns safely in greater than 3 sec and stops with no loss of balance, or pivot turns safely within 3 sec and stops with mild imbalance, requires small steps to catch balance.   Step Over Obstacle Is able to step over one shoe box (4.5 in total height) without changing gait speed. No evidence of imbalance.   Gait with Narrow Base of Support Ambulates 4-7 steps.  4 steps   Gait with Eyes Closed Cannot walk 20 ft without assistance, severe gait deviations or imbalance, deviates greater than 15 in outside 12 in walkway width or will not attempt task.  deviates > 15"   Ambulating Backwards Walks 20 ft, uses assistive device, slower speed, mild gait  deviations, deviates 6-10 in outside 12 in walkway width.   Steps Alternating feet, no rail.   Total Score 22                     [facility] Adult PT Treatment/Exercise - 02/06/16 0001      Transfers   Transfers Sit to Stand;Stand to Sit   Sit to Stand 7: Independent   Five time sit to stand comments  8.17 seconds   Stand to Sit 7: Independent     Ambulation/Gait   Ambulation/Gait Yes   Ambulation/Gait Assistance 6: Modified independent (Device/Increase time);5: Supervision  mod I for stability/balance; (S)/cueing to control GR   Ambulation/Gait Assistance Details Gait x230' over level, indoor surfaces with tactile, verbal cueing (manual approximation at R knee; verbal cueing to decrease R step length initially) for control of R knee hyperextension during  R midstance. Pt able to control when gait velocity decreased and pt focusing on R knee control. Gait x500' over unlevel, paved surfaces with Mod I for stability/balance. Note increased force/velocity of R recurvatum when ascending ramp/inclined surface.   Ambulation Distance (Feet) 720 Feet   Assistive device None   Gait Pattern Step-through pattern;Decreased stride length;Decreased hip/knee flexion - right;Right genu recurvatum;Narrow base of support  mild R pelvic retraction during stance   Ambulation Surface Level;Unlevel;Indoor;Outdoor;Paved   Stairs Yes   Stairs Assistance 6: Modified independent (Device/Increase time)   Stair Management Technique No rails   Number of Stairs 8   Height of Stairs 6   Ramp 6: Modified independent (Device);5: Supervision   Ramp Details (indicate cue type and reason) mod I for stability/balance; (S)/cueing for decreased R step length to increase R knee control   Curb 6: Modified independent (Device/increase time)     Exercises   Exercises Other Exercises   Other Exercises  Standing R TKE against resistance of green Tband x20 reps with anchor anterior to pt, x20 reps with anchoe posterior to pt. Tactile cueing initially required for increased awareness/control of R knee position with effective return demo from pt.             Balance Exercises - 02/06/16 0905      Balance Exercises: Standing   Tandem Gait Forward;Intermittent upper extremity support;2 reps;Other (comment)  2 x10' at [place]           PT Education - 02/06/16 0857    Education provided Yes   Education Details STG findings, progress, and functional implications. HEP: added TKE resisted by green Tband and tandem gait; see Pt Instructions. Explained reason for genu recurvatum and the following techniques to manage GR: decreased velocity of gait, increased awareness, R pelvic position during gait, R gluteus max/med strengthening, HEP for R knee control, and use of heel wedge if  other solutions aren't successful.   Person(s) Educated Patient;Spouse   Methods Demonstration;Explanation;Tactile cues;Verbal cues;Handout   Comprehension Verbalized understanding;Returned demonstration          PT Short Term Goals - 02/06/16 0827      PT SHORT TERM GOAL #1   Title Pt will be independent and verbalize understanding of initial HEP to continue progress made in therapy. (Target Date for all STGs: 02/07/16)   Baseline Met 11/15.   Time 4   Period Weeks   Status Achieved     PT SHORT TERM GOAL #2   Title Pt will improve FGA to 18/30 to indicate improved functional gait and mobility.   Baseline 11/15: FGA = 22/30  Time 4   Period Weeks   Status Achieved     PT SHORT TERM GOAL #3   Title Pt will improve 5 times sit to stand to < or = 12 sec indicating a decr risk of falls.   Baseline 11/15: 5x STS = 8.17 seconds   Time 4   Period Weeks   Status Achieved     PT SHORT TERM GOAL #4   Title Pt will negotiate 8 stairs with supervision and no UE support to incr safety negotiating stairs at home.   Baseline Met 11/15.   Time 4   Period Weeks   Status Achieved     PT SHORT TERM GOAL #5   Title Pt will ambulate 500' with mod I over level surfaces, ramps, and curbs to incr safey ambulating at home.   Baseline Met 11/15.   Time 4   Period Weeks   Status Achieved           PT Long Term Goals - 01/10/16 1705      PT LONG TERM GOAL #1   Title Pt will be independent and verbalize understanding of HEP and on-going fitness plan to maintain progress made in therapy and his active lifestyle. (Target Date for all LTGs: 03/06/16)   Time 8   Period Weeks   Status New     PT LONG TERM GOAL #2   Title Pt will improve FGA score to > or = 23/30 to indicate decr fall risk and improved functional mobility.   Time 8   Period Weeks   Status New     PT LONG TERM GOAL #3   Title Pt will improve gait speed to > 2.62 ft/sec to indicate status of community ambulator.    Time 8   Period Weeks   Status New     PT LONG TERM GOAL #4   Title Pt will negotiate 15 stairs with mod I and no UE support to improve safety with stairs at home.   Time 8   Period Weeks   Status New     PT LONG TERM GOAL #5   Title Pt will ambulate 1030f outdoors over unlevel surfaces, pavement, gravel, grass, ramps, and curbs with mod I to incr safety while ambulating in the community.   Time 8   Period Weeks   Status New               Plan - 02/06/16 0908    Clinical Impression Statement Pt met 5 of 5 STG's, indicating improved dynamic gait stability, increased stability/independence with community mobility and stair negotiation, and safe daily compliance with HEP. Note increased R genu recurvatum in R midstance, which is most prominent when pt ascending inclined surface. Added Tband-resisted R TKE to HEP. FGA findings also suggest increased visual reliance. Wil plan to address this impairment at future session.    Rehab Potential Excellent   Clinical Impairments Affecting Rehab Potential HTN, Type 2 DM   PT Frequency 2x / week   PT Duration 8 weeks  Expect pt will DC after 6 weeks   PT Treatment/Interventions ADLs/Self Care Home Management;Functional mobility training;Stair training;Gait training;Therapeutic activities;Therapeutic exercise;Balance training;Neuromuscular re-education;Patient/family education;Manual techniques;Energy conservation   PT Next Visit Plan Modify HEP from bridging and sit<>stand to standing strengthening or higher-level activities (? tall kneeling <> half kneeling), quadruped UE/LE extension (may want to check with OT about R shouler strength for this). Also consider adding standing balance with dec visual input.   PT  Home Exercise Plan bridging, water activites, sit<>stands, Tband-resisted R TKE, tandem gait   Consulted and Agree with Plan of Care Patient;Family member/caregiver   Family Member Consulted Wife      Patient will benefit from  skilled therapeutic intervention in order to improve the following deficits and impairments:  Abnormal gait, Decreased activity tolerance, Decreased knowledge of precautions, Decreased endurance, Decreased strength, Impaired perceived functional ability, Impaired sensation, Improper body mechanics, Impaired UE functional use, Decreased coordination  Visit Diagnosis: Other abnormalities of gait and mobility  Other lack of coordination  Muscle weakness (generalized)  Hemiplegia and hemiparesis following cerebral infarction affecting right dominant side (HCC)  Unsteadiness on feet     Problem List Patient Active Problem List   Diagnosis Date Noted  . Acute CVA (cerebrovascular accident) ([place]) 01/05/2016  . Right sided weakness 01/05/2016  . Dyslipidemia associated with type 2 diabetes mellitus ([place]) 01/05/2016  . Hypokalemia 01/05/2016  . Type 2 diabetes mellitus with vascular disease ([place]) 10/21/2006    .Billie Ruddy, PT, DPT [facility] [address] [place], [place], [address] Phone: [phone]   Fax:  [phone] 02/06/16, 9:16 AM  Name: Christian Clements MRN: [id number] Date of Birth: 29-Dec-1950

## 2016-02-06 NOTE — Patient Instructions (Signed)
Tandem Walking    Walk the length of your counter with with each foot directly in front of other, heel of one foot touching toes of other foot with each step. Hold each position for 2 seconds. Both feet straight ahead. Hand close to countertop, using for balance as needed. Practice walking in this way for 2-3 minutes, __1-2_ times per day.  KNEE: Extension - Standing (Band)    Place GREEN band around back of RIGHT knee. You'll be facing the anchor (stable surface) first. Allow knee to bend. Push knee against band to straighten; squeeze glutes. Do not hyperextend or lock knee. Hold _2__ seconds, then slowly return to resting position.   Perform 20 reps; then turn around, so anchor is behind you. Perform the same exercise with force pulling in the opposite direction. Do this 20 times. Repeat each __2-3__ times per day on the right leg.  Copyright  V[employer]. All rights reserved.

## 2016-02-07 ENCOUNTER — Encounter: Payer: Self-pay | Admitting: Physical Therapy

## 2016-02-07 ENCOUNTER — Encounter: Payer: Self-pay | Admitting: *Deleted

## 2016-02-07 ENCOUNTER — Ambulatory Visit: Payer: Medicare Other | Admitting: Physical Therapy

## 2016-02-07 ENCOUNTER — Ambulatory Visit: Payer: Medicare Other | Admitting: *Deleted

## 2016-02-07 DIAGNOSIS — I69351 Hemiplegia and hemiparesis following cerebral infarction affecting right dominant side: Secondary | ICD-10-CM

## 2016-02-07 DIAGNOSIS — M6281 Muscle weakness (generalized): Secondary | ICD-10-CM

## 2016-02-07 DIAGNOSIS — R2681 Unsteadiness on feet: Secondary | ICD-10-CM

## 2016-02-07 DIAGNOSIS — R278 Other lack of coordination: Secondary | ICD-10-CM

## 2016-02-07 DIAGNOSIS — R2689 Other abnormalities of gait and mobility: Secondary | ICD-10-CM

## 2016-02-07 NOTE — Therapy (Signed)
[facility] [address] [place], [place], [address] Phone: [phone]   Fax:  [phone]  Occupational Therapy Treatment  Patient Details  Name: Christian Clements MRN: [id number] Date of Birth: 1950/07/14 Referring Provider: Dr Rosalin Hawking  Encounter Date: 02/07/2016   G-Code Next Visit      OT End of Session - 02/07/16 1105    Visit Number 9   Number of Visits 16   Date for OT Re-Evaluation 03/05/16   Authorization Type [place] [facility] [employer]   Authorization - Visit Number 9   Authorization - Number of Visits 10   OT Start Time [id number]   OT Stop Time 1101   OT Time Calculation (min) 46 min   Activity Tolerance Patient tolerated treatment well   Behavior During Therapy [facility] for tasks assessed/performed      Past Medical History:  Diagnosis Date  . Diabetes mellitus   . Hypertension   . Prostatitis 2011    History reviewed. No pertinent surgical history.  There were no vitals filed for this visit.      Subjective Assessment - 02/07/16 1020    Subjective  Pt reports that "It's getting stronger/better"   Patient is accompained by: Family member   Pertinent History see Epic   Currently in Pain? No/denies   Pain Score 0-No pain            [facility] OT Assessment - 02/07/16 0001      Coordination   9 Hole Peg Test Right   Right 9 Hole Peg Test 56.85 seconds     Hand Function   Right Hand Grip (lbs) 79   Right Hand Lateral Pinch 21 lbs   Right Hand 3 Point Pinch 11 lbs                  OT Treatments/Exercises (OP) - 02/07/16 0001      ADLs   ADL Comments Pt reports that he cont to have some difficulty zipping jacket; reports Mod I with increased time for tasks for washing back of head, shaving, reaching into cabinet for a glass etc.     Exercises   Exercises Neurological Re-education     Neurological Re-education Exercises   Seated with weight on hand Seated with  weight bearing through RUE - vc's for elbow extension and true weight bearing and weight shifting required.    Other Weight-Bearing Exercises 1 Quadraped: A-P wt shifts, followed by cat/cow stretch for scapula stability. Pt required min cues for elbow extension and demo min to mod difficulty. Modified push ups in quadraped x2 sets 10 with rest break between sets and vc's for positioning. Pt also noted to have limited trunk mobility.   Other Weight-Bearing Exercises 2 Standing: modified push ups at wall for scapula stability with min compensations when fatigued   Reciprocal Movements UBE level 4 x8 min w/ focus on mainting RPMs and reciprocal arm movements and strengthening.     Functional Reaching Activities   Mid Level Graded clothes pins. VC's for positioning and avoidance of compensatory patterns     Weight Bearing Technique   Weight Bearing Technique Yes  See above under Neuro Re-Ed             Balance Exercises - 02/06/16 0905      Balance Exercises: Standing   Tandem Gait Forward;Intermittent upper extremity support;2 reps;Other (comment)  2 x10' at [employer]           OT Education -  02/07/16 1104    Education provided Yes   Education Details Discussed STG's findings and LTG's for OT. Cont with HEP for right shoulder proximal stabilization & general strengthening and coordination.   Person(s) Educated Patient;Spouse   Methods Explanation;Demonstration;Tactile cues;Verbal cues   Comprehension Verbalized understanding;Returned demonstration          OT Short Term Goals - 02/07/16 1031      OT SHORT TERM GOAL #1   Title Pt will be Mod I HEP for AROM and coordination RUE   Time 4   Period Weeks   Status Achieved     OT SHORT TERM GOAL #2   Title Pt will demonstrate improved grip strength by 5# or more RUE as seen by JAMAR geip assessment   Baseline RUE 55# at Eval; 79# 02/07/16   Time 4   Period Weeks   Status Achieved     OT SHORT TERM GOAL #3   Title Pt  will be Mod I stating 3 possible signs/symptoms/risk factors of CVA   Time 4   Period Weeks   Status Achieved           OT Long Term Goals - 01/09/16 1126      OT LONG TERM GOAL #1   Title Pt will be Mod I upgraded HEP for RUE   Time 8   Period Weeks   Status New     OT LONG TERM GOAL #2   Title Pt will be Mod I simple meal/snack prep in ADL kitchen while maintaining safety   Time 8   Period Weeks   Status New     OT LONG TERM GOAL #3   Title Pt will demonstrate AROM RUE WNL's w/o c/o joint tightness at end range for shoulder flexion (as compared to LUE)   Time 8   Period Weeks   Status New     OT LONG TERM GOAL #4   Title Pt will demonstrate improved MMT RUE of 4/5 or greater   Baseline MMT grossly 3/5 at eval on 01/09/16   Time 8   Period Weeks   Status New     OT LONG TERM GOAL #5   Title Pt will demonstrate improved grip strength RUE to 60# or greater RUE   Baseline See Eval 01/09/16   Time 8   Period Weeks   Status New     Long Term Additional Goals   Additional Long Term Goals Yes     OT LONG TERM GOAL #6   Title Pt will demonstrate improved 9 Hole Peg test score R UE to less than 1 min for improved coordination & functional hand use   Baseline 01/09/16 1 min 43 seconds   Time 8   Period Weeks   Status New               Plan - 02/07/16 1106    Clinical Impression Statement Discussed progress and findings from STG's as well as LTG's for OT. Pt should cont to benefit from out-pt OT to assist with increasing R proximal UE strength, generalized strength & coordination RUE. Nice gains in shoulder stability noted today.   Rehab Potential Good   OT Frequency 2x / week   OT Duration 8 weeks   OT Treatment/Interventions Self-care/ADL training;Fluidtherapy;DME and/or AE instruction;Splinting;Patient/family education;Balance training;Therapeutic exercises;Therapeutic exercise;Therapeutic activities;Cognitive remediation/compensation;Functional Mobility  Training;Neuromuscular education;Manual Therapy;Visual/perceptual remediation/compensation   Plan G-code; NMR and functional reaching RUE.   Consulted and Agree with Plan of Care Patient  Family Member Consulted Spouse      Patient will benefit from skilled therapeutic intervention in order to improve the following deficits and impairments:  Abnormal gait, Decreased coordination, Decreased range of motion, Impaired flexibility, Impaired sensation, Decreased activity tolerance, Cardiopulmonary status limiting activity, Decreased knowledge of precautions, Impaired UE functional use, Decreased knowledge of use of DME, Decreased balance, Decreased mobility, Decreased strength, Impaired vision/preception, Decreased cognition  Visit Diagnosis: Muscle weakness (generalized)  Hemiplegia and hemiparesis following cerebral infarction affecting right dominant side ([place])  Other lack of coordination    Problem List Patient Active Problem List   Diagnosis Date Noted  . Acute CVA (cerebrovascular accident) ([place]) 01/05/2016  . Right sided weakness 01/05/2016  . Dyslipidemia associated with type 2 diabetes mellitus ([place]) 01/05/2016  . Hypokalemia 01/05/2016  . Type 2 diabetes mellitus with vascular disease ([place]) 10/21/2006    Barnhill, Amy Ardath Sax, OTR/L 02/07/2016, 11:11 AM  [facility] [address] [place], [place], [address] Phone: [phone]   Fax:  [phone]  Name: Malikai Bartol MRN: [id number] Date of Birth: 1951/01/12

## 2016-02-07 NOTE — Patient Instructions (Signed)
DEVELOPMENTAL POSITION: Tall Kneeling to Half Kneeling    From tall kneeling position, shift weight to one side. Bring opposite leg forward, place foot flat on surface. _10_ reps per set, _2__ sets per day, _3-4__ days per week Repeat with other leg.  Copyright  VHI. All rights reserved.  Bracing With Arm / Leg Raise (Quadruped)    On hands and knees find neutral spine. Tighten abdominals and hold. Alternating, lift arm to shoulder level and opposite leg to hip level. Repeat _10__ times. Do _2__ times a day.   Copyright  VHI. All rights reserved.

## 2016-02-07 NOTE — Therapy (Signed)
[facility] [address] [place], [place], [address] Phone: [phone]   Fax:  [phone]  Physical Therapy Treatment  Patient Details  Name: Christian Clements MRN: [id number] Date of Birth: [birth year] Referring Provider: Rosalin Hawking, MD  Encounter Date: 02/07/2016      PT End of Session - 02/07/16 1320    Visit Number 8   Number of Visits 17   Date for PT Re-Evaluation 03/10/16   Authorization Type [employer] Medicare   Authorization Time Period G-code every 10th visit   PT Start Time 1102   PT Stop Time 1148   PT Time Calculation (min) 46 min   Equipment Utilized During Treatment Gait belt   Activity Tolerance Patient tolerated treatment well   Behavior During Therapy WFL for tasks assessed/performed      Past Medical History:  Diagnosis Date  . Diabetes mellitus   . Hypertension   . Prostatitis 2011    History reviewed. No pertinent surgical history.  There were no vitals filed for this visit.      Subjective Assessment - 02/07/16 1105    Subjective Pt reports he has plans to go work out after lunch today at his fitness center   Patient is accompained by: Family member   Pertinent History Likes to be called "[name]".  PMH significant: HTN and DM II,   Patient Stated Goals "I want to be able to move around and do things I did before I had this stroke. I like to golf and I work out 5-6 times per week."   Currently in Pain? No/denies      Therapeutic exercise - High kneeling <> half kneeling alternating legs; red mat on floor; 20 x; min guard for balance; Demo and VC for technique and posture.  - Quadruped with reciprocal arms and legs; red mat on floor; 10 reps x 2; min guard for balance; VC and tactile cues for technique. LAQ seated on mat table; R LE; 10 reps x 3; Verbal cues and demo for technique; Tactile and verbal cues for postural control.  Therapeutic activity - Golf simulation for return to  sport; Outside on grass putting whiffle and golf balls; PTA student facilitating weight shift through the LE; Verbal and tactile cues to increase weight shift; Pt instructed and returned demo on correct technique to safely pick up golf balls. Included ambulation on grassy hills to simulate golf course. Min guard for safety. Patient R LE fatigued quickly and began hyperextending with ambulation. Took significant concentration for patient to control hyperextension.       PT Education - 02/07/16 1318    Education provided Yes   Education Details Correct technique for safely picking up golf balls.  Progressed HEP   Person(s) Educated Patient   Methods Explanation   Comprehension Verbalized understanding;Returned demonstration;Need further instruction          PT Short Term Goals - 02/06/16 0827      PT SHORT TERM GOAL #1   Title Pt will be independent and verbalize understanding of initial HEP to continue progress made in therapy. (Target Date for all STGs: 02/07/16)   Baseline Met 11/15.   Time 4   Period Weeks   Status Achieved     PT SHORT TERM GOAL #2   Title Pt will improve FGA to 18/30 to indicate improved functional gait and mobility.   Baseline 11/15: FGA = 22/30   Time 4   Period Weeks   Status Achieved  PT SHORT TERM GOAL #3   Title Pt will improve 5 times sit to stand to < or = 12 sec indicating a decr risk of falls.   Baseline 11/15: 5x STS = 8.17 seconds   Time 4   Period Weeks   Status Achieved     PT SHORT TERM GOAL #4   Title Pt will negotiate 8 stairs with supervision and no UE support to incr safety negotiating stairs at home.   Baseline Met 11/15.   Time 4   Period Weeks   Status Achieved     PT SHORT TERM GOAL #5   Title Pt will ambulate 500' with mod I over level surfaces, ramps, and curbs to incr safey ambulating at home.   Baseline Met 11/15.   Time 4   Period Weeks   Status Achieved           PT Long Term Goals - 01/10/16 1705      PT  LONG TERM GOAL #1   Title Pt will be independent and verbalize understanding of HEP and on-going fitness plan to maintain progress made in therapy and his active lifestyle. (Target Date for all LTGs: 03/06/16)   Time 8   Period Weeks   Status New     PT LONG TERM GOAL #2   Title Pt will improve FGA score to > or = 23/30 to indicate decr fall risk and improved functional mobility.   Time 8   Period Weeks   Status New     PT LONG TERM GOAL #3   Title Pt will improve gait speed to > 2.62 ft/sec to indicate status of community ambulator.   Time 8   Period Weeks   Status New     PT LONG TERM GOAL #4   Title Pt will negotiate 15 stairs with mod I and no UE support to improve safety with stairs at home.   Time 8   Period Weeks   Status New     PT LONG TERM GOAL #5   Title Pt will ambulate 1054f outdoors over unlevel surfaces, pavement, gravel, grass, ramps, and curbs with mod I to incr safety while ambulating in the community.   Time 8   Period Weeks   Status New           Plan - 02/07/16 1320    Clinical Impression Statement Todays skilled session focused on progressing HEP to patient's ability level and simulating return to sport activity. As patient fatigued R genu recurvatum became more prevalent. Patient is progressing towards goals and should benefit from continued PT to achieve unmet goals.     Rehab Potential Excellent   Clinical Impairments Affecting Rehab Potential HTN, Type 2 DM   PT Frequency 2x / week   PT Duration 8 weeks  Expect pt will DC after 6 weeks   PT Treatment/Interventions ADLs/Self Care Home Management;Functional mobility training;Stair training;Gait training;Therapeutic activities;Therapeutic exercise;Balance training;Neuromuscular re-education;Patient/family education;Manual techniques;Energy conservation   PT Next Visit Plan Balanced activities with dec visual imput; Knee stability activities to prevent genu recurvatum   PT Home Exercise Plan  bridging, water activites, sit<>stands, Tband-resisted R TKE, tandem gait   Consulted and Agree with Plan of Care Patient;Family member/caregiver   Family Member Consulted Wife      Patient will benefit from skilled therapeutic intervention in order to improve the following deficits and impairments:  Abnormal gait, Decreased activity tolerance, Decreased knowledge of precautions, Decreased endurance, Decreased strength, Impaired perceived functional ability,  Impaired sensation, Improper body mechanics, Impaired UE functional use, Decreased coordination  Visit Diagnosis: Muscle weakness (generalized)  Hemiplegia and hemiparesis following cerebral infarction affecting right dominant side ([facility])  Other lack of coordination  Other abnormalities of gait and mobility  Unsteadiness on feet   Problem List Patient Active Problem List   Diagnosis Date Noted  . Acute CVA (cerebrovascular accident) ([place]) 01/05/2016  . Right sided weakness 01/05/2016  . Dyslipidemia associated with type 2 diabetes mellitus ([place]) 01/05/2016  . Hypokalemia 01/05/2016  . Type 2 diabetes mellitus with vascular disease ([place]) 10/21/2006    Benjiman Core, [place] 02/07/2016, 2:49 PM  [facility] [address] [place], [place], [address] Phone: [phone]   Fax:  [phone]  Name: Haroon Shatto MRN: [id number] Date of Birth: 26-Jun-1950

## 2016-02-10 NOTE — Progress Notes (Signed)
Subjective:    Patient ID: Christian Clements, male    DOB: April 13, 1950, 65 y.o.   MRN: [id number]  HPI pt is referred by Dr Alain Marion, for diabetes.  Pt states DM was dx'ed in 2006; he has mild if any neuropathy of the lower extremities; he has associated CVA; he has been on insulin since 1 month ago; pt says his diet and exercise are both much better; he has never had pancreatitis, severe hypoglycemia or DKA.  He brings a record of his cbg's which I have reviewed today.  It varies from 97-191. It is in general higher as the day goes on.  Since on the insulin, he has had 1 episode of hypoglycemia, and this was mild.  This happened in the middle of the night. Past Medical History:  Diagnosis Date  . Diabetes mellitus   . Hypertension   . Prostatitis 2011    No past surgical history on file.  Social History   Social History  . Marital status: Married    Spouse name: N/A  . Number of children: N/A  . Years of education: N/A   Occupational History  . PE teacher [employer]   Social History Main Topics  . Smoking status: Former Research scientist (life sciences)  . Smokeless tobacco: Never Used  . Alcohol use 1.8 oz/week    3 Glasses of wine per week  . Drug use: No  . Sexual activity: Yes   Other Topics Concern  . Not on file   Social History Narrative   Regular exercise- yes          Current Outpatient Prescriptions on File Prior to Visit  Medication Sig Dispense Refill  . aspirin 325 MG tablet Take 1 tablet (325 mg total) by mouth daily. 30 tablet 0  . atorvastatin (LIPITOR) 40 MG tablet Take 1 tablet (40 mg total) by mouth daily at 6 PM. 30 tablet 0  . blood glucose meter kit and supplies KIT Dispense based on patient and insurance preference. Use two times daily as directed. (FOR ICD-9 250.00, 250.01). 1 each 0  . Cholecalciferol 1000 UNITS tablet Take 1,000 Units by mouth daily.      . Insulin Pen Needle 32G X 5 MM MISC 1 application by Does not apply route at bedtime. 100 each 1  .  metFORMIN (GLUCOPHAGE) 1000 MG tablet Take 1 tablet (1,000 mg total) by mouth 2 (two) times daily with a meal. 180 tablet 3  . PROBIOTIC CAPS Take 1 capsule by mouth daily.      . quinapril-hydrochlorothiazide (ACCURETIC) 20-12.5 MG tablet Take 2 tablets by mouth daily. 180 tablet 2  . sildenafil (VIAGRA) 100 MG tablet Take 1 tablet (100 mg total) by mouth daily as needed. 10 tablet 11  . triamcinolone ointment (KENALOG) 0.5 % Apply 1 application topically 2 (two) times daily. 30 g 1  . zolpidem (AMBIEN) 10 MG tablet Take 0.5 tablets (5 mg total) by mouth at bedtime as needed. 30 tablet 5   No current facility-administered medications on file prior to visit.     No Known Allergies  Family History  Problem Relation Age of Onset  . Cancer Mother 59    leukemia  . Hypertension Other   . Diabetes Maternal Grandfather     BP 116/62   Pulse 63   Ht 5' 10"  (1.778 m)   Wt 213 lb (96.6 kg)   SpO2 97%   BMI 30.56 kg/m    Review of Systems denies blurry vision,  headache, chest pain, sob, n/v, urinary frequency, muscle cramps, excessive diaphoresis, memory loss, cold intolerance, rhinorrhea, and easy bruising.   He has lost 8 lbs x 1 month.     Objective:   Physical Exam VS: see vs page GEN: no distress HEAD: head: no deformity eyes: no periorbital swelling, no proptosis external nose and ears are normal mouth: no lesion seen NECK: supple, thyroid is not enlarged CHEST WALL: no deformity LUNGS: clear to auscultation CV: reg rate and rhythm, no murmur ABD: abdomen is soft, nontender.  no hepatosplenomegaly.  not distended.  Self-reducing ventral hernia MUSCULOSKELETAL: muscle bulk and strength are grossly normal on the left side of the body, and decreased on the right.  no obvious joint swelling.  gait is steady, but he favors the RLE.  EXTEMITIES: no deformity.  no ulcer on the feet.  feet are of normal color and temp.  no edema.  There is bilateral onychomycosis of the toenails.   Old healed surgical scar at the left knee PULSES: dorsalis pedis intact bilat.  no carotid bruit NEURO:  cn 2-12 grossly intact.   readily moves all 4's.  sensation is intact to touch on the feet.   SKIN:  Normal texture and temperature.  No rash or suspicious lesion is visible.   NODES:  None palpable at the neck PSYCH: alert, well-oriented.  Does not appear anxious nor depressed.    i personally reviewed electrocardiogram tracing (01/04/16): Indication:  Impression: QS complexes anteriorly.  I have reviewed outside records, and summarized: Pt was noted to have elevated a1c, and referred here.  He was noted to have been ref here in 2016, but he did not go.    Lab Results  Component Value Date   HGBA1C 9.1 (H) 01/05/2016   Lab Results  Component Value Date   CREATININE 1.03 01/10/2016   BUN 15 01/10/2016   NA 137 01/10/2016   K 4.3 01/10/2016   CL 101 01/10/2016   CO2 26 01/10/2016      Assessment & Plan:  Insulin-requiring type 2 DM, with CVA: severe exacerbation. We discussed options.  He chooses a trial off insulin, and on increased oral rx.   Patient is advised the following: Patient Instructions  good diet and exercise significantly improve the control of your diabetes.  please let me know if you wish to be referred to a dietician.  high blood sugar is very risky to your health.  you should see an eye doctor and dentist every year.  It is very important to get all recommended vaccinations.  Controlling your blood pressure and cholesterol drastically reduces the damage diabetes does to your body.  Those who smoke should quit.  Please discuss these with your doctor.  check your blood sugar once a day.  vary the time of day when you check, between before the 3 meals, and at bedtime.  also check if you have symptoms of your blood sugar being too high or too low.  please keep a record of the readings and bring it to your next appointment here (or you can bring the meter itself).   You can write it on any piece of paper.  please call us sooner if your blood sugar goes below 70, or if you have a lot of readings over 200. For now, please: Please continue the same metformin, and: Change the insulin to "tradjenta."  If it is high, we can add another pill.  Please come back for a follow-up appointment in 2-3  weeks.

## 2016-02-12 ENCOUNTER — Encounter: Payer: Self-pay | Admitting: *Deleted

## 2016-02-12 ENCOUNTER — Ambulatory Visit: Payer: Medicare Other | Admitting: Physical Therapy

## 2016-02-12 ENCOUNTER — Encounter: Payer: Self-pay | Admitting: Endocrinology

## 2016-02-12 ENCOUNTER — Ambulatory Visit: Payer: Medicare Other | Admitting: *Deleted

## 2016-02-12 ENCOUNTER — Ambulatory Visit (INDEPENDENT_AMBULATORY_CARE_PROVIDER_SITE_OTHER): Payer: Medicare Other | Admitting: Endocrinology

## 2016-02-12 VITALS — BP 116/62 | HR 63 | Ht 70.0 in | Wt 213.0 lb

## 2016-02-12 DIAGNOSIS — E1159 Type 2 diabetes mellitus with other circulatory complications: Secondary | ICD-10-CM

## 2016-02-12 DIAGNOSIS — M6281 Muscle weakness (generalized): Secondary | ICD-10-CM

## 2016-02-12 DIAGNOSIS — I69351 Hemiplegia and hemiparesis following cerebral infarction affecting right dominant side: Secondary | ICD-10-CM | POA: Diagnosis not present

## 2016-02-12 DIAGNOSIS — R2689 Other abnormalities of gait and mobility: Secondary | ICD-10-CM

## 2016-02-12 DIAGNOSIS — R278 Other lack of coordination: Secondary | ICD-10-CM

## 2016-02-12 MED ORDER — LINAGLIPTIN 5 MG PO TABS: 5.0000 mg | ORAL_TABLET | Freq: Every day | ORAL | 11 refills | Status: DC

## 2016-02-12 NOTE — Patient Instructions (Addendum)
good diet and exercise significantly improve the control of your diabetes.  please let me know if you wish to be referred to a dietician.  high blood sugar is very risky to your health.  you should see an eye doctor and dentist every year.  It is very important to get all recommended vaccinations.  Controlling your blood pressure and cholesterol drastically reduces the damage diabetes does to your body.  Those who smoke should quit.  Please discuss these with your doctor.  check your blood sugar once a day.  vary the time of day when you check, between before the 3 meals, and at bedtime.  also check if you have symptoms of your blood sugar being too high or too low.  please keep a record of the readings and bring it to your next appointment here (or you can bring the meter itself).  You can write it on any piece of paper.  please call us sooner if your blood sugar goes below 70, or if you have a lot of readings over 200. For now, please: Please continue the same metformin, and: Change the insulin to "tradjenta."  If it is high, we can add another pill.  Please come back for a follow-up appointment in 2-3 weeks.

## 2016-02-12 NOTE — Patient Instructions (Addendum)
Tandem Walking    Walk the length of your counter with with each foot directly in front of other, heel of one foot touching toes of other foot with each step. Hold each position for 2 seconds. Both feet straight ahead. Hand close to countertop, using for balance as needed. Practice walking in this way for 2-3 minutes, __1-2_ times per day.  KNEE: Extension - Standing (Band)    Place GREEN band around back of RIGHT knee. You'll be facing the anchor (stable surface) first. Allow knee to bend. Push knee against band to straighten; squeeze glutes. Do not hyperextend or lock knee. Hold _2__ seconds, then slowly return to resting position.   Perform 20 reps; then turn around, so anchor is behind you. Perform the same exercise with force pulling in the opposite direction. Do this 20 times. Repeat each __2-3__ times per day on the right leg.    Crab Walking    In 1/4 squat position, walk keeping hips under shoulders. Perform __2_ laps stepping forward. Perform __2_ laps stepping backward. Perform __2_ laps (each direction) stepping to side. *Don't let right knee snap (or go "too straight").  Broad Jump    Begin in squat position with hips under shoulders. Jump forward, landing on both feet in squatting position. Perform _2__ laps. Perform __2_ laps backward. Goal: to keep right knee "soft" - don't let it go too straight.  Copyright  VHI. All rights reserved.

## 2016-02-12 NOTE — Therapy (Signed)
[facility] [address] [place], [place], [address] Phone: [phone]   Fax:  [phone]  Physical Therapy Treatment  Patient Details  Name: Christian Clements MRN: [id number] Date of Birth: 1950-06-02 Referring Provider: Rosalin Hawking, MD  Encounter Date: 02/12/2016      PT End of Session - 02/12/16 1707    Visit Number 9   Number of Visits 17   Date for PT Re-Evaluation 03/10/16   Authorization Type [employer] Medicare   Authorization Time Period G-code every 10th visit   PT Start Time 0933   PT Stop Time 1015   PT Time Calculation (min) 42 min   Activity Tolerance Patient tolerated treatment well   Behavior During Therapy [facility] for tasks assessed/performed      Past Medical History:  Diagnosis Date  . Diabetes mellitus   . Hypertension   . Prostatitis 2011    No past surgical history on file.  There were no vitals filed for this visit.      Subjective Assessment - 02/12/16 1655    Subjective Pt reports he still works out in the pool but current pool exercises are "pretty easy", per pt. Continues to perceive a "hitch" in his walking, but hasn't gotten around to performing TKE exercise at home.   Patient is accompained by: Family member   Pertinent History Likes to be called "[name]".  PMH significant: HTN and DM II,   Patient Stated Goals "I want to be able to move around and do things I did before I had this stroke. I like to golf and I work out 5-6 times per week."   Currently in Pain? No/denies                         [facility] Adult PT Treatment/Exercise - 02/12/16 0001      Ambulation/Gait   Ambulation/Gait Yes   Ambulation/Gait Assistance 6: Modified independent (Device/Increase time);5: Supervision   Ambulation/Gait Assistance Details With NMES at R hamstrings for increased R knee control in midstance, pt performed gait x230' over level, indoor surfaces and x550' over unlevel, outdoor  surfaces; initially with tactile cueing/approximation at R knee to control R GR. During outdoor gait, PT provided verbal cueing to dec R step length when ascending incline to facilitate R knee control. Mod I for stability/balance. (S)/cueing for R knee control.   Ambulation Distance (Feet) 780 Feet   Assistive device None   Gait Pattern Step-through pattern;Decreased stride length;Decreased hip/knee flexion - right;Right genu recurvatum;Narrow base of support   Ambulation Surface Level;Unlevel;Indoor;Outdoor;Paved     Self-Care   Self-Care Other Self-Care Comments   Other Self-Care Comments  Explained, demonstrated, and provided paper handout for the following pool exercises: squat walking forward, retro, and laterally; as well as forward/retro jumping (pt did not return demo).     Neuro Re-ed    Neuro Re-ed Details  With NMES at R hamstrings, pt performed the following: standing without UE support performing LLE tapping at multidirectional targets x25 reps with tactile cueing (for initial 10 reps) to prevent R genu recurvatum; progressed to using LLE to turn over cones then replace cones into upright position 8 x4 reps with 4 episodes of R genu recurvatum.                PT Education - 02/12/16 1657    Education provided Yes   Education Details HEP: added pool exercises to emphasize R knee control. Reiterated importance of  performing TKE to control R genu recurvatum. Added heel wedge to R shoe.   Person(s) Educated Patient;Spouse   Methods Explanation;Demonstration;Handout;Tactile cues;Verbal cues   Comprehension Verbalized understanding;Returned demonstration          PT Short Term Goals - 02/06/16 0827      PT SHORT TERM GOAL #1   Title Pt will be independent and verbalize understanding of initial HEP to continue progress made in therapy. (Target Date for all STGs: 02/07/16)   Baseline Met 11/15.   Time 4   Period Weeks   Status Achieved     PT SHORT TERM GOAL #2    Title Pt will improve FGA to 18/30 to indicate improved functional gait and mobility.   Baseline 11/15: FGA = 22/30   Time 4   Period Weeks   Status Achieved     PT SHORT TERM GOAL #3   Title Pt will improve 5 times sit to stand to < or = 12 sec indicating a decr risk of falls.   Baseline 11/15: 5x STS = 8.17 seconds   Time 4   Period Weeks   Status Achieved     PT SHORT TERM GOAL #4   Title Pt will negotiate 8 stairs with supervision and no UE support to incr safety negotiating stairs at home.   Baseline Met 11/15.   Time 4   Period Weeks   Status Achieved     PT SHORT TERM GOAL #5   Title Pt will ambulate 500' with mod I over level surfaces, ramps, and curbs to incr safey ambulating at home.   Baseline Met 11/15.   Time 4   Period Weeks   Status Achieved           PT Long Term Goals - 01/10/16 1705      PT LONG TERM GOAL #1   Title Pt will be independent and verbalize understanding of HEP and on-going fitness plan to maintain progress made in therapy and his active lifestyle. (Target Date for all LTGs: 03/06/16)   Time 8   Period Weeks   Status New     PT LONG TERM GOAL #2   Title Pt will improve FGA score to > or = 23/30 to indicate decr fall risk and improved functional mobility.   Time 8   Period Weeks   Status New     PT LONG TERM GOAL #3   Title Pt will improve gait speed to > 2.62 ft/sec to indicate status of community ambulator.   Time 8   Period Weeks   Status New     PT LONG TERM GOAL #4   Title Pt will negotiate 15 stairs with mod I and no UE support to improve safety with stairs at home.   Time 8   Period Weeks   Status New     PT LONG TERM GOAL #5   Title Pt will ambulate 1080f outdoors over unlevel surfaces, pavement, gravel, grass, ramps, and curbs with mod I to incr safety while ambulating in the community.   Time 8   Period Weeks   Status New               Plan - 02/12/16 1709    Clinical Impression Statement Session focused  on addressing motor control in R knee to decrease R genu recurvatum during dynamic single limb stance and with commnuity mobility. Able to consistently control R knee with dynamic R SLS (with and without NMES); however, less  control noted during community mobility. Will continue to assess/address.   Rehab Potential Excellent   Clinical Impairments Affecting Rehab Potential HTN, Type 2 DM   PT Frequency 2x / week   PT Duration 8 weeks  Expect pt will DC after 6 weeks   PT Treatment/Interventions ADLs/Self Care Home Management;Functional mobility training;Stair training;Gait training;Therapeutic activities;Therapeutic exercise;Balance training;Neuromuscular re-education;Patient/family education;Manual techniques;Energy conservation   PT Next Visit Plan GCODES and PN. Balance activities with dec visual imput; Knee stability activities to prevent R genu recurvatum   PT Home Exercise Plan bridging, water activites, sit<>stands, Tband-resisted R TKE, tandem gait   Consulted and Agree with Plan of Care Patient;Family member/caregiver   Family Member Consulted Wife      Patient will benefit from skilled therapeutic intervention in order to improve the following deficits and impairments:  Abnormal gait, Decreased activity tolerance, Decreased knowledge of precautions, Decreased endurance, Decreased strength, Impaired perceived functional ability, Impaired sensation, Improper body mechanics, Impaired UE functional use, Decreased coordination  Visit Diagnosis: Muscle weakness (generalized)  Hemiplegia and hemiparesis following cerebral infarction affecting right dominant side (HCC)  Other abnormalities of gait and mobility     Problem List Patient Active Problem List   Diagnosis Date Noted  . Acute CVA (cerebrovascular accident) ([place]) 01/05/2016  . Right sided weakness 01/05/2016  . Dyslipidemia associated with type 2 diabetes mellitus ([place]) 01/05/2016  . Hypokalemia 01/05/2016  . Type 2  diabetes mellitus with vascular disease ([place]) 10/21/2006    Billie Ruddy, PT, DPT [facility] [address] [place], [place], [address] Phone: [phone]   Fax:  [phone] 02/12/16, 5:16 PM  Name: Christian Clements MRN: [id number] Date of Birth: 03-Mar-1951

## 2016-02-12 NOTE — Therapy (Signed)
[facility] [address] [place], [place], [address] Phone: [phone]   Fax:  [phone]  Occupational Therapy Treatment  Patient Details  Name: Christian Clements MRN: [id number] Date of Birth: [birth year] Referring Provider: Dr Rosalin Hawking  Encounter Date: 02/12/2016      OT End of Session - 02/12/16 1037    Visit Number 10   Number of Visits 16   Date for OT Re-Evaluation 03/05/16   Authorization Type [place] Medicare   Authorization - Visit Number 10  G code done on 02/12/16   Authorization - Number of Visits 20   OT Start Time 0845   OT Stop Time 0929   OT Time Calculation (min) 44 min   Activity Tolerance Patient tolerated treatment well   Behavior During Therapy [facility] for tasks assessed/performed      Past Medical History:  Diagnosis Date  . Diabetes mellitus   . Hypertension   . Prostatitis 2011    History reviewed. No pertinent surgical history.  There were no vitals filed for this visit.      Subjective Assessment - 02/12/16 0849    Subjective  Pt reports that HEP is "going great" and states that he's working out at the gym. Denies pain   Patient is accompained by: Family member   Pertinent History see Epic   Currently in Pain? No/denies   Pain Score 0-No pain   Multiple Pain Sites No            [facility] OT Assessment - 02/12/16 0001      Observation/Other Assessments   Other Surveys  Select   Quick DASH  22.5     Coordination   Right 9 Hole Peg Test 39.47 seconds   Box and Blocks Right = 40                  OT Treatments/Exercises (OP) - 02/12/16 0001      ADLs   ADL Comments Discussed performance of activities that remain difficult secondary to decreased (but improved coordination) R UE: ie: buttons, zipper on difficult jacket etc. Overall ADL's discussed and pt reports Mod I for all activities. See Quick DASH score as well.     Exercises   Exercises Neurological Re-education     Fine Motor Coordination   Other Fine Motor Exercises Reviewed HEP for FM/coordination verbally in clinic today.   Other Fine Motor Exercises 9 Hole Peg Test and Quick DASH RUE (see Eval section of chart for details)     Neurological Re-education Exercises   Other Exercises 1 Red theraband for shoulder/proximal strengthening to include Rows, shoulder flexion/extension, horizontal ABD   Reciprocal Movements UBE level 5 x10 min w/ focus on mainting RPMs and reciprocal arm movements and strengthening.                OT Education - 02/12/16 1036    Education provided Yes   Education Details Upgraded HEP for red theraband ex's for RUE strengthening.   Person(s) Educated Patient   Methods Explanation;Demonstration;Verbal cues;Handout   Comprehension Verbalized understanding;Returned demonstration;Need further instruction          OT Short Term Goals - 02/07/16 1031      OT SHORT TERM GOAL #1   Title Pt will be Mod I HEP for AROM and coordination RUE   Time 4   Period Weeks   Status Achieved     OT SHORT TERM GOAL #2   Title Pt will  demonstrate improved grip strength by 5# or more RUE as seen by JAMAR geip assessment   Baseline RUE 55# at Eval; 79# 02/07/16   Time 4   Period Weeks   Status Achieved     OT SHORT TERM GOAL #3   Title Pt will be Mod I stating 3 possible signs/symptoms/risk factors of CVA   Time 4   Period Weeks   Status Achieved           OT Long Term Goals - 01/09/16 1126      OT LONG TERM GOAL #1   Title Pt will be Mod I upgraded HEP for RUE   Time 8   Period Weeks   Status New     OT LONG TERM GOAL #2   Title Pt will be Mod I simple meal/snack prep in ADL kitchen while maintaining safety   Time 8   Period Weeks   Status New     OT LONG TERM GOAL #3   Title Pt will demonstrate AROM RUE WNL's w/o c/o joint tightness at end range for shoulder flexion (as compared to LUE)   Time 8   Period Weeks    Status New     OT LONG TERM GOAL #4   Title Pt will demonstrate improved MMT RUE of 4/5 or greater   Baseline MMT grossly 3/5 at eval on 01/09/16   Time 8   Period Weeks   Status New     OT LONG TERM GOAL #5   Title Pt will demonstrate improved grip strength RUE to 60# or greater RUE   Baseline See Eval 01/09/16   Time 8   Period Weeks   Status New     Long Term Additional Goals   Additional Long Term Goals Yes     OT LONG TERM GOAL #6   Title Pt will demonstrate improved 9 Hole Peg test score R UE to less than 1 min for improved coordination & functional hand use   Baseline 01/09/16 1 min 43 seconds   Time 8   Period Weeks   Status New               Plan - 02/12/16 1038    Clinical Impression Statement Discussed progress and findings following recent G Code and upgraded HEP for red theraband ex's RUE/strengthening. 9 Hole peg improved from 1 min 43 seconds RUE on 01/09/16 to 39.47 seconds RUE today. Quick DASH was 65.90 on 01/09/16 and was 22.5 today. Box and Blocks improved from 33 to 40 blocks RUE. Pt reports Mod I ADL's overall. Excellent gains in shoulder stability and coordination noted today in clinic.   Rehab Potential Good   OT Frequency 2x / week   OT Duration 8 weeks   OT Treatment/Interventions Self-care/ADL training;Fluidtherapy;DME and/or AE instruction;Splinting;Patient/family education;Balance training;Therapeutic exercises;Therapeutic exercise;Therapeutic activities;Cognitive remediation/compensation;Functional Mobility Training;Neuromuscular education;Manual Therapy;Visual/perceptual remediation/compensation   Plan Check Theraband ex's, NMR, Functional Reach RUE.   Consulted and Agree with Plan of Care Patient;Family member/caregiver   Family Member Consulted Spouse      Patient will benefit from skilled therapeutic intervention in order to improve the following deficits and impairments:  Abnormal gait, Decreased coordination, Decreased range of  motion, Impaired flexibility, Impaired sensation, Decreased activity tolerance, Cardiopulmonary status limiting activity, Decreased knowledge of precautions, Impaired UE functional use, Decreased knowledge of use of DME, Decreased balance, Decreased mobility, Decreased strength, Impaired vision/preception, Decreased cognition  Visit Diagnosis: Muscle weakness (generalized)  Hemiplegia and hemiparesis following cerebral infarction  affecting right dominant side ([place])  Other lack of coordination  Other abnormalities of gait and mobility      G-Codes - 2016-02-23 1042    Functional Assessment Tool Used Clinical Judgment; Quick DASH   Functional Limitation Carrying, moving and handling objects   Carrying, Moving and Handling Objects Current Status [id number]) At least 20 percent but less than 40 percent impaired, limited or restricted   Carrying, Moving and Handling Objects Goal Status [id number]) At least 1 percent but less than 20 percent impaired, limited or restricted      Problem List Patient Active Problem List   Diagnosis Date Noted  . Acute CVA (cerebrovascular accident) ([place]) 01/05/2016  . Right sided weakness 01/05/2016  . Dyslipidemia associated with type 2 diabetes mellitus ([place]) 01/05/2016  . Hypokalemia 01/05/2016  . Type 2 diabetes mellitus with vascular disease ([place]) 10/21/2006    Shoni Quijas Ardath Sax, OTR/L 02/23/2016, 10:43 AM  [facility] [address] [place], [place], [address] Phone: [phone]   Fax:  [phone]  Name: Christian Clements MRN: [id number] Date of Birth: Dec 07, 1950

## 2016-02-13 ENCOUNTER — Encounter: Payer: Self-pay | Admitting: Physical Therapy

## 2016-02-13 ENCOUNTER — Ambulatory Visit: Payer: Medicare Other | Admitting: Physical Therapy

## 2016-02-13 ENCOUNTER — Encounter: Payer: Self-pay | Admitting: Internal Medicine

## 2016-02-13 ENCOUNTER — Ambulatory Visit (INDEPENDENT_AMBULATORY_CARE_PROVIDER_SITE_OTHER): Payer: Medicare Other | Admitting: Internal Medicine

## 2016-02-13 DIAGNOSIS — I639 Cerebral infarction, unspecified: Secondary | ICD-10-CM | POA: Diagnosis not present

## 2016-02-13 DIAGNOSIS — R531 Weakness: Secondary | ICD-10-CM

## 2016-02-13 DIAGNOSIS — M6281 Muscle weakness (generalized): Secondary | ICD-10-CM

## 2016-02-13 DIAGNOSIS — E785 Hyperlipidemia, unspecified: Secondary | ICD-10-CM

## 2016-02-13 DIAGNOSIS — E1159 Type 2 diabetes mellitus with other circulatory complications: Secondary | ICD-10-CM | POA: Diagnosis not present

## 2016-02-13 DIAGNOSIS — E1169 Type 2 diabetes mellitus with other specified complication: Secondary | ICD-10-CM

## 2016-02-13 DIAGNOSIS — R2689 Other abnormalities of gait and mobility: Secondary | ICD-10-CM

## 2016-02-13 DIAGNOSIS — I69351 Hemiplegia and hemiparesis following cerebral infarction affecting right dominant side: Secondary | ICD-10-CM

## 2016-02-13 MED ORDER — ASPIRIN 325 MG PO TABS: 325.0000 mg | ORAL_TABLET | Freq: Every day | ORAL | 3 refills | Status: AC

## 2016-02-13 NOTE — Assessment & Plan Note (Signed)
CVA related  PT/OT

## 2016-02-13 NOTE — Therapy (Addendum)
[facility] [address] [place], [place], [address] Phone: [phone]   Fax:  [phone]  Physical Therapy Treatment  Patient Details  Name: Christian Clements MRN: [id number] Date of Birth: 1951-02-26 Referring Provider: Rosalin Hawking, MD  Encounter Date: 02/13/2016      PT End of Session - 02/13/16 1244    Visit Number 10   Number of Visits 17   Date for PT Re-Evaluation 03/10/16   Authorization Type [employer] Medicare   Authorization Time Period G-code every 10th visit   PT Start Time 1103   PT Stop Time 1145   PT Time Calculation (min) 42 min   Equipment Utilized During Treatment Gait belt   Activity Tolerance Patient tolerated treatment well   Behavior During Therapy [facility] for tasks assessed/performed      Past Medical History:  Diagnosis Date  . Diabetes mellitus   . Hypertension   . Prostatitis 2011    History reviewed. No pertinent surgical history.  There were no vitals filed for this visit.      Subjective Assessment - 02/13/16 1107    Subjective Patient reports he has done some exercises for his knee   Patient is accompained by: Family member   Pertinent History Likes to be called "[name]".  PMH significant: HTN and DM II,   Patient Stated Goals "I want to be able to move around and do things I did before I had this stroke. I like to golf and I work out 5-6 times per week."   Currently in Pain? No/denies   Pain Score 0-No pain          [facility] PT Assessment - 02/13/16 1112      Functional Gait  Assessment   Gait assessed  Yes   Gait Level Surface Walks 20 ft in less than 5.5 sec, no assistive devices, good speed, no evidence for imbalance, normal gait pattern, deviates no more than 6 in outside of the 12 in walkway width.   Change in Gait Speed Able to smoothly change walking speed without loss of balance or gait deviation. Deviate no more than 6 in outside of the 12 in walkway width.   Gait with  Horizontal Head Turns Performs head turns smoothly with no change in gait. Deviates no more than 6 in outside 12 in walkway width   Gait with Vertical Head Turns Performs head turns with no change in gait. Deviates no more than 6 in outside 12 in walkway width.   Gait and Pivot Turn Pivot turns safely within 3 sec and stops quickly with no loss of balance.   Step Over Obstacle Is able to step over 2 stacked shoe boxes taped together (9 in total height) without changing gait speed. No evidence of imbalance.   Gait with Narrow Base of Support Ambulates 7-9 steps.   Gait with Eyes Closed Cannot walk 20 ft without assistance, severe gait deviations or imbalance, deviates greater than 15 in outside 12 in walkway width or will not attempt task.   Ambulating Backwards Walks 20 ft, no assistive devices, good speed, no evidence for imbalance, normal gait   Steps Alternating feet, no rail.   Total Score 26       Therapeutic Exercise   - Step ups onto aerobic step with 5lb dumbbell in each hand; Alternating lead leg; 20 reps; VC for posture.  - Step down off aerobic step with eccentric loading; Alternating lead leg; 20 reps; VC to raise up toes  on the L LE.   Neuromuscular Re-ed  - Static standing on two pillows in corner; EO static standing for 30 seconds progressing to head turns for 30 seconds. No sway or challenge to balance observed. Progressed to EC static standing 3x 30 seconds; EC head turns 3x 30 seconds.         PT Short Term Goals - 02/06/16 0827      PT SHORT TERM GOAL #1   Title Pt will be independent and verbalize understanding of initial HEP to continue progress made in therapy. (Target Date for all STGs: 02/07/16)   Baseline Met 11/15.   Time 4   Period Weeks   Status Achieved     PT SHORT TERM GOAL #2   Title Pt will improve FGA to 18/30 to indicate improved functional gait and mobility.   Baseline 11/15: FGA = 22/30   Time 4   Period Weeks   Status Achieved     PT  SHORT TERM GOAL #3   Title Pt will improve 5 times sit to stand to < or = 12 sec indicating a decr risk of falls.   Baseline 11/15: 5x STS = 8.17 seconds   Time 4   Period Weeks   Status Achieved     PT SHORT TERM GOAL #4   Title Pt will negotiate 8 stairs with supervision and no UE support to incr safety negotiating stairs at home.   Baseline Met 11/15.   Time 4   Period Weeks   Status Achieved     PT SHORT TERM GOAL #5   Title Pt will ambulate 500' with mod I over level surfaces, ramps, and curbs to incr safey ambulating at home.   Baseline Met 11/15.   Time 4   Period Weeks   Status Achieved           PT Long Term Goals - 01/10/16 1705      PT LONG TERM GOAL #1   Title Pt will be independent and verbalize understanding of HEP and on-going fitness plan to maintain progress made in therapy and his active lifestyle. (Target Date for all LTGs: 03/06/16)   Time 8   Period Weeks   Status New     PT LONG TERM GOAL #2   Title Pt will improve FGA score to > or = 23/30 to indicate decr fall risk and improved functional mobility.   Time 8   Period Weeks   Status New     PT LONG TERM GOAL #3   Title Pt will improve gait speed to > 2.62 ft/sec to indicate status of community ambulator.   Time 8   Period Weeks   Status New     PT LONG TERM GOAL #4   Title Pt will negotiate 15 stairs with mod I and no UE support to improve safety with stairs at home.   Time 8   Period Weeks   Status New     PT LONG TERM GOAL #5   Title Pt will ambulate 1024f outdoors over unlevel surfaces, pavement, gravel, grass, ramps, and curbs with mod I to incr safety while ambulating in the community.   Time 8   Period Weeks   Status New           Plan - 02/13/16 1244    Clinical Impression Statement Todays skilled session focused on increasing knee stability, FGA for gcodes, and balance activities with eyes closed. He is showing progress towards goals and  should benefit from continued PT to  achieve unmet goals.   Rehab Potential Excellent   Clinical Impairments Affecting Rehab Potential HTN, Type 2 DM   PT Frequency 2x / week   PT Duration 8 weeks  Expect pt will DC after 6 weeks   PT Treatment/Interventions ADLs/Self Care Home Management;Functional mobility training;Stair training;Gait training;Therapeutic activities;Therapeutic exercise;Balance training;Neuromuscular re-education;Patient/family education;Manual techniques;Energy conservation   PT Next Visit Plan Balance activities with dec visual imput; Knee stability activities to prevent R genu recurvatum   PT Home Exercise Plan bridging, water activites, sit<>stands, Tband-resisted R TKE, tandem gait   Consulted and Agree with Plan of Care Patient;Family member/caregiver   Family Member Consulted Wife      Patient will benefit from skilled therapeutic intervention in order to improve the following deficits and impairments:  Abnormal gait, Decreased activity tolerance, Decreased knowledge of precautions, Decreased endurance, Decreased strength, Impaired perceived functional ability, Impaired sensation, Improper body mechanics, Impaired UE functional use, Decreased coordination  Visit Diagnosis: Muscle weakness (generalized)  Hemiplegia and hemiparesis following cerebral infarction affecting right dominant side (HCC)  Other abnormalities of gait and mobility       G-Codes - February 16, 2016 1250    Functional Assessment Tool Used FGA = 26/30   Functional Limitation Mobility: Walking and moving around      Problem List Patient Active Problem List   Diagnosis Date Noted  . Acute CVA (cerebrovascular accident) ([place]) 01/05/2016  . Right sided weakness 01/05/2016  . Dyslipidemia associated with type 2 diabetes mellitus ([place]) 01/05/2016  . Hypokalemia 01/05/2016  . Type 2 diabetes mellitus with vascular disease ([place]) 10/21/2006    Benjiman Core, [place] 02/16/2016, 12:53 PM  [facility] [address] [place], [place], [address] Phone: [phone]   Fax:  [phone]  Name: Preet Perrier MRN: [id number] Date of Birth: 1951-03-02   Addendum by Billie Ruddy, PT, DPT  Physical Therapy Progress Note  Dates of Reporting Period: 01/10/16 to 16-Feb-2016  Objective Reports of Subjective Statement: See above.  Objective Measurements: FGA = 26/30  Goal Update: See above.  Plan:Continue per POC.  Reason Skilled Services are Required: To maximize stability/independence with functional mobility.  Billie Ruddy, PT, [facility] [address] [place], [place], [address] Phone: [phone]   Fax:  [phone] 16-Feb-2016, 5:44 PM

## 2016-02-13 NOTE — Assessment & Plan Note (Signed)
Lipitor 

## 2016-02-13 NOTE — Assessment & Plan Note (Signed)
Dr Loanne Drilling Actos, Metformin, Trajenta

## 2016-02-13 NOTE — Progress Notes (Signed)
Subjective:  Patient ID: Christian Clements, male    DOB: September 14, 1950  Age: 65 y.o. MRN: [id number]  CC: No chief complaint on file.   HPI Christian Clements presents for CVA, R hemiparesis,  HTN, DM f/u. He declined shots  Outpatient Medications Prior to Visit  Medication Sig Dispense Refill  . aspirin 325 MG tablet Take 1 tablet (325 mg total) by mouth daily. 30 tablet 0  . atorvastatin (LIPITOR) 40 MG tablet Take 1 tablet (40 mg total) by mouth daily at 6 PM. 30 tablet 0  . blood glucose meter kit and supplies KIT Dispense based on patient and insurance preference. Use two times daily as directed. (FOR ICD-9 250.00, 250.01). 1 each 0  . Cholecalciferol 1000 UNITS tablet Take 1,000 Units by mouth daily.      . Insulin Pen Needle 32G X 5 MM MISC 1 application by Does not apply route at bedtime. 100 each 1  . linagliptin (TRADJENTA) 5 MG TABS tablet Take 1 tablet (5 mg total) by mouth daily. 30 tablet 11  . metFORMIN (GLUCOPHAGE) 1000 MG tablet Take 1 tablet (1,000 mg total) by mouth 2 (two) times daily with a meal. 180 tablet 3  . PROBIOTIC CAPS Take 1 capsule by mouth daily.      . quinapril-hydrochlorothiazide (ACCURETIC) 20-12.5 MG tablet Take 2 tablets by mouth daily. 180 tablet 2  . sildenafil (VIAGRA) 100 MG tablet Take 1 tablet (100 mg total) by mouth daily as needed. 10 tablet 11  . triamcinolone ointment (KENALOG) 0.5 % Apply 1 application topically 2 (two) times daily. 30 g 1  . zolpidem (AMBIEN) 10 MG tablet Take 0.5 tablets (5 mg total) by mouth at bedtime as needed. 30 tablet 5   No facility-administered medications prior to visit.     ROS Review of Systems  Constitutional: Negative for appetite change, fatigue and unexpected weight change.  HENT: Negative for congestion, nosebleeds, sneezing, sore throat and trouble swallowing.   Eyes: Negative for itching and visual disturbance.  Respiratory: Negative for cough.   Cardiovascular: Negative for chest pain, palpitations and  leg swelling.  Gastrointestinal: Negative for abdominal distention, blood in stool, diarrhea and nausea.  Genitourinary: Negative for frequency and hematuria.  Musculoskeletal: Positive for gait problem. Negative for back pain, joint swelling and neck pain.  Skin: Negative for rash.  Neurological: Negative for dizziness, tremors, speech difficulty and weakness.  Psychiatric/Behavioral: Negative for agitation, dysphoric mood and sleep disturbance. The patient is not nervous/anxious.     Objective:  BP 120/82   Pulse 71   Wt 215 lb (97.5 kg)   SpO2 97%   BMI 30.85 kg/m   BP Readings from Last 3 Encounters:  02/13/16 120/82  02/12/16 116/62  01/10/16 132/82    Wt Readings from Last 3 Encounters:  02/13/16 215 lb (97.5 kg)  02/12/16 213 lb (96.6 kg)  01/10/16 220 lb (99.8 kg)    Physical Exam  Constitutional: He is oriented to person, place, and time. He appears well-developed. No distress.  NAD  HENT:  Mouth/Throat: Oropharynx is clear and moist.  Eyes: Conjunctivae are normal. Pupils are equal, round, and reactive to light.  Neck: Normal range of motion. No JVD present. No thyromegaly present.  Cardiovascular: Normal rate, regular rhythm, normal heart sounds and intact distal pulses.  Exam reveals no gallop and no friction rub.   No murmur heard. Pulmonary/Chest: Effort normal and breath sounds normal. No respiratory distress. He has no wheezes. He has no rales. He  exhibits no tenderness.  Abdominal: Soft. Bowel sounds are normal. He exhibits no distension and no mass. There is no tenderness. There is no rebound and no guarding.  Musculoskeletal: Normal range of motion. He exhibits no edema or tenderness.  Lymphadenopathy:    He has no cervical adenopathy.  Neurological: He is alert and oriented to person, place, and time. He has normal reflexes. No cranial nerve deficit. He exhibits normal muscle tone. He displays a negative Romberg sign. Coordination abnormal. Gait normal.    Skin: Skin is warm and dry. No rash noted.  Psychiatric: He has a normal mood and affect. His behavior is normal. Judgment and thought content normal.  mild R side weakness  Lab Results  Component Value Date   WBC 7.5 01/05/2016   HGB 14.8 01/05/2016   HCT 42.2 01/05/2016   PLT 251 01/05/2016   GLUCOSE 165 (H) 01/10/2016   CHOL 92 01/05/2016   TRIG 174 (H) 01/05/2016   HDL 30 (L) 01/05/2016   LDLDIRECT 30.1 11/14/2010   LDLCALC 27 01/05/2016   ALT 26 01/04/2016   AST 21 01/04/2016   NA 137 01/10/2016   K 4.3 01/10/2016   CL 101 01/10/2016   CREATININE 1.03 01/10/2016   BUN 15 01/10/2016   CO2 26 01/10/2016   TSH 1.67 11/08/2012   PSA 0.32 11/14/2010   INR 1.04 01/04/2016   HGBA1C 9.1 (H) 01/05/2016    Ct Head Wo Contrast  Result Date: 01/04/2016 CLINICAL DATA:  Right upper extremity numbness and unsteady gait. History of hypertension. Diabetes. EXAM: CT HEAD WITHOUT CONTRAST TECHNIQUE: Contiguous axial images were obtained from the base of the skull through the vertex without intravenous contrast. COMPARISON:  None. FINDINGS: Brain: Mild low density in the periventricular white matter likely related to small vessel disease. No mass lesion, hemorrhage, hydrocephalus, acute infarct, intra-axial, or extra-axial fluid collection. Vascular: No hyperdense vessel or unexpected calcification. Skull: Normal Sinuses/Orbits: Normal orbits and globes. Right ethmoid air cell mucous retention cyst or polyp. Clear mastoid air cells. Other: None IMPRESSION: 1.  No acute intracranial abnormality. 2. Mild small vessel ischemic change. Electronically Signed   By: Abigail Miyamoto M.D.   On: 01/04/2016 13:12   Mr Brain Wo Contrast  Result Date: 01/04/2016 CLINICAL DATA:  Acute onset of numbness of the right hand and fingers while exercising. EXAM: MRI HEAD WITHOUT CONTRAST MRA HEAD WITHOUT CONTRAST TECHNIQUE: Multiplanar, multiecho pulse sequences of the brain and surrounding structures were obtained  without intravenous contrast. Angiographic images of the head were obtained using MRA technique without contrast. COMPARISON:  CT same day FINDINGS: MRI HEAD FINDINGS Brain: Diffusion imaging shows a sub cm acute infarction at the pontomedullary junction anteriorly just to the left of midline. There is also a small linear white matter acute infarction in the left frontal subcortical white matter. There are mild chronic small-vessel changes of the pons. There are moderate chronic small-vessel ischemic changes of the cerebral hemispheric white matter. No large vessel territory infarction. No mass lesion, hemorrhage, hydrocephalus or extra-axial collection. Vascular: Major vessels at the base of the brain show flow. Skull and upper cervical spine: Negative Sinuses/Orbits: Clear Other: None significant MRA HEAD FINDINGS Both internal carotid arteries are widely patent into the brain. The anterior and middle cerebral vessels are normal without proximal stenosis, aneurysm or vascular malformation. Fetal origin of the left PCA. Large posterior communicating artery on the right. Both vertebral arteries are patent. The right terminates in PICA. Left vertebral artery supplies the basilar. No  basilar stenosis. Posterior circulation branch vessels are patent and normal. 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 Small acute left frontal subcortical white matter infarction. These 2 acute infarctions could be coincidental in due to ordinary small vessel disease, but emboli from the heart or ascending aorta could also result in this. Moderate chronic small-vessel ischemic changes of the cerebral hemispheric white matter for a age. Negative intracranial MR angiography of the large and medium size vessels. Electronically Signed   By: Nelson Chimes M.D.   On: 01/04/2016 17:41   Mr Jodene Nam Head/brain BJ Cm  Result Date: 01/04/2016 CLINICAL DATA:  Acute onset of numbness of  the right hand and fingers while exercising. EXAM: MRI HEAD WITHOUT CONTRAST MRA HEAD WITHOUT CONTRAST TECHNIQUE: Multiplanar, multiecho pulse sequences of the brain and surrounding structures were obtained without intravenous contrast. Angiographic images of the head were obtained using MRA technique without contrast. COMPARISON:  CT same day FINDINGS: MRI HEAD FINDINGS Brain: Diffusion imaging shows a sub cm acute infarction at the pontomedullary junction anteriorly just to the left of midline. There is also a small linear white matter acute infarction in the left frontal subcortical white matter. There are mild chronic small-vessel changes of the pons. There are moderate chronic small-vessel ischemic changes of the cerebral hemispheric white matter. No large vessel territory infarction. No mass lesion, hemorrhage, hydrocephalus or extra-axial collection. Vascular: Major vessels at the base of the brain show flow. Skull and upper cervical spine: Negative Sinuses/Orbits: Clear Other: None significant MRA HEAD FINDINGS Both internal carotid arteries are widely patent into the brain. The anterior and middle cerebral vessels are normal without proximal stenosis, aneurysm or vascular malformation. Fetal origin of the left PCA. Large posterior communicating artery on the right. Both vertebral arteries are patent. The right terminates in PICA. Left vertebral artery supplies the basilar. No basilar stenosis. Posterior circulation branch vessels are patent and normal. 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 Small acute left frontal subcortical white matter infarction. These 2 acute infarctions could be coincidental in due to ordinary small vessel disease, but emboli from the heart or ascending aorta could also result in this. Moderate chronic small-vessel ischemic changes of the cerebral hemispheric white matter for a age. Negative intracranial MR angiography  of the large and medium size vessels. Electronically Signed   By: Nelson Chimes M.D.   On: 01/04/2016 17:41    Assessment & Plan:   There are no diagnoses linked to this encounter. I am having Mr. Sheffler maintain his Probiotic, Cholecalciferol, zolpidem, sildenafil, metFORMIN, quinapril-hydrochlorothiazide, triamcinolone ointment, aspirin, atorvastatin, blood glucose meter kit and supplies, Insulin Pen Needle, and linagliptin.  No orders of the defined types were placed in this encounter.    Follow-up: No Follow-up on file.  Walker Kehr, MD

## 2016-02-13 NOTE — Progress Notes (Signed)
Pre visit review using our clinic review tool, if applicable. No additional management support is needed unless otherwise documented below in the visit note. 

## 2016-02-13 NOTE — Assessment & Plan Note (Signed)
10/17 lacunar with R hemiparesis ASA, Lipitor,  Accuretic In PT/OT

## 2016-02-18 ENCOUNTER — Encounter: Payer: Self-pay | Admitting: Nurse Practitioner

## 2016-02-18 ENCOUNTER — Ambulatory Visit (INDEPENDENT_AMBULATORY_CARE_PROVIDER_SITE_OTHER): Payer: Medicare Other | Admitting: Nurse Practitioner

## 2016-02-18 ENCOUNTER — Ambulatory Visit: Payer: Medicare Other | Admitting: Physical Therapy

## 2016-02-18 VITALS — BP 126/89 | HR 72 | Ht 70.0 in | Wt 215.8 lb

## 2016-02-18 DIAGNOSIS — E1169 Type 2 diabetes mellitus with other specified complication: Secondary | ICD-10-CM

## 2016-02-18 DIAGNOSIS — E1159 Type 2 diabetes mellitus with other circulatory complications: Secondary | ICD-10-CM

## 2016-02-18 DIAGNOSIS — E785 Hyperlipidemia, unspecified: Secondary | ICD-10-CM

## 2016-02-18 DIAGNOSIS — I639 Cerebral infarction, unspecified: Secondary | ICD-10-CM | POA: Diagnosis not present

## 2016-02-18 DIAGNOSIS — R531 Weakness: Secondary | ICD-10-CM | POA: Diagnosis not present

## 2016-02-18 NOTE — Progress Notes (Signed)
[facility]  PATIENT: Christian Clements DOB: 1950/08/06   REASON FOR VISIT: Hospital follow-up for stroke HISTORY FROM: Patient and wife    HISTORY OF PRESENT ILLNESS:64 y.o.malewith past medical history of diabetes, hypertension who presented to [facility] for evaluation of right arm numbness and tingling and right foot drop started 2-3 days prior to this admission. 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 MRA of the brain was negative. Carotid Dopplers unremarkable. 2-D echo EF 55%. Hemoglobin A1c 9.1. LDL 27 he was placed on aspirin 325 daily, no anti-thrombotic prior to admission. He returns to the stroke clinic today for follow-up from his hospitalization. He continues to get OT and physical therapy. He has not had recurrent stroke or TIA symptoms. He does not have any bruising or bleeding from aspirin He needs to be scheduled for 30 day cardiac event monitoring to rule out atrial fibrillation. He has been placed on Tradjenta with better control of his blood sugars. He returns for reevaluation   REVIEW OF SYSTEMS: Full 14 system review of systems performed and notable only for those listed, all others are neg:  Constitutional: neg  Cardiovascular: neg Ear/Nose/Throat: neg  Skin: neg Eyes: neg Respiratory: neg Gastroitestinal: neg  Hematology/Lymphatic: neg  Endocrine: neg Musculoskeletal:neg Allergy/Immunology: neg Neurological: Numbness Psychiatric: neg Sleep : neg   ALLERGIES: No Known Allergies  HOME MEDICATIONS: Outpatient Medications Prior to Visit  Medication Sig Dispense Refill  . aspirin 325 MG tablet Take 1 tablet (325 mg total) by mouth daily. 100 tablet 3  . atorvastatin (LIPITOR) 40 MG tablet Take 1 tablet (40 mg total) by mouth daily at 6 PM. 30 tablet 0  . blood glucose meter kit and supplies KIT  Dispense based on patient and insurance preference. Use two times daily as directed. (FOR ICD-9 250.00, 250.01). 1 each 0  . Cholecalciferol 1000 UNITS tablet Take 1,000 Units by mouth daily.      Marland Kitchen linagliptin (TRADJENTA) 5 MG TABS tablet Take 1 tablet (5 mg total) by mouth daily. 30 tablet 11  . metFORMIN (GLUCOPHAGE) 1000 MG tablet Take 1 tablet (1,000 mg total) by mouth 2 (two) times daily with a meal. 180 tablet 3  . PROBIOTIC CAPS Take 1 capsule by mouth daily.      . quinapril-hydrochlorothiazide (ACCURETIC) 20-12.5 MG tablet Take 2 tablets by mouth daily. 180 tablet 2  . sildenafil (VIAGRA) 100 MG tablet Take 1 tablet (100 mg total) by mouth daily as needed. 10 tablet 11  . triamcinolone ointment (KENALOG) 0.5 % Apply 1 application topically 2 (two) times daily. 30 g 1  . Insulin Pen Needle 32G X 5 MM MISC 1 application by Does not apply route at bedtime. (Patient not taking: Reported on 02/18/2016) 100 each 1  . zolpidem (AMBIEN) 10 MG tablet Take 0.5 tablets (5 mg total) by mouth at bedtime as needed. (Patient not taking: Reported on 02/18/2016) 30 tablet 5   No facility-administered medications prior to visit.     PAST MEDICAL HISTORY: Past Medical History:  Diagnosis Date  . Diabetes mellitus   . Hypertension   . Prostatitis 2011    PAST SURGICAL HISTORY: History reviewed. No pertinent surgical history.  FAMILY HISTORY: Family History  Problem Relation Age of Onset  . Cancer Mother 51    leukemia  . Hypertension Other   . Diabetes Maternal Grandfather  SOCIAL HISTORY: Social History   Social History  . Marital status: Married    Spouse name: N/A  . Number of children: N/A  . Years of education: N/A   Occupational History  . PE teacher [employer]   Social History Main Topics  . Smoking status: Former Research scientist (life sciences)  . Smokeless tobacco: Never Used  . Alcohol use 1.8 oz/week    3 Glasses of wine per week  . Drug use: No  . Sexual activity: Yes    Other Topics Concern  . Not on file   Social History Narrative   Regular exercise- yes           PHYSICAL EXAM  Vitals:   02/18/16 0903  BP: 126/89  Pulse: 72  Weight: 215 lb 12.8 oz (97.9 kg)  Height: _0  (1.778 m)   Body mass index is 30.96 kg/m.  Generalized: Well developed, in no acute distress  Head: normocephalic and atraumatic,. Oropharynx benign  Neck: Supple, no carotid bruits  Cardiac: Regular rate rhythm, no murmur  Musculoskeletal: No deformity   Neurological examination   Mentation: Alert oriented to time, place, history taking. Attention span and concentration appropriate. Recent and remote memory intact.  Follows all commands speech and language fluent.   Cranial nerve II-XII: Fundoscopic exam reveals sharp disc margins.Pupils were equal round reactive to light extraocular movements were full, visual field were full on confrontational test. Mild right nasolabial labial fold flattening Facial sensation normal. hearing was intact to finger rubbing bilaterally. Uvula tongue midline. head turning and shoulder shrug were normal and symmetric.Tongue protrusion into cheek strength was normal. Motor: normal bulk and tone, full strength in the BUE, BLE, except mild right hand dexterity  difficulties. Sensory: normal and symmetric to light touch, pinprick, and  Vibration, in the upper and lower extremities Coordination: finger-nose-finger, heel-to-shin bilaterally, no dysmetria, no tremor Reflexes: 1+ upper lower and symmetric plantar responses were flexor bilaterally. Gait and Station: Rising up from seated position without assistance, normal stance,  moderate stride, good arm swing, smooth turning, able to perform tiptoe, and heel walking without difficulty. Tandem gait is mildly unsteady  DIAGNOSTIC DATA (LABS, IMAGING, TESTING) - I reviewed patient records, labs, notes, testing and imaging myself where available.  Lab Results  Component Value Date   WBC 7.5  01/05/2016   HGB 14.8 01/05/2016   HCT 42.2 01/05/2016   MCV 89.2 01/05/2016   PLT 251 01/05/2016      Component Value Date/Time   NA 137 01/10/2016 0909   K 4.3 01/10/2016 0909   CL 101 01/10/2016 0909   CO2 26 01/10/2016 0909   GLUCOSE 165 (H) 01/10/2016 0909   BUN 15 01/10/2016 0909   CREATININE 1.03 01/10/2016 0909   CALCIUM 9.8 01/10/2016 0909   PROT 7.7 01/04/2016 1255   ALBUMIN 4.6 01/04/2016 1255   AST 21 01/04/2016 1255   ALT 26 01/04/2016 1255   ALKPHOS 61 01/04/2016 1255   BILITOT 0.8 01/04/2016 1255   GFRNONAA >60 01/06/2016 0213   GFRAA >60 01/06/2016 0213   Lab Results  Component Value Date   CHOL 92 01/05/2016   HDL 30 (L) 01/05/2016   LDLCALC 27 01/05/2016   LDLDIRECT 30.1 11/14/2010   TRIG 174 (H) 01/05/2016   CHOLHDL 3.1 01/05/2016   Lab Results  Component Value Date   HGBA1C 9.1 (H) 01/05/2016   ASSESSMENT AND PLAN  65 y.o. year old male  has a past medical history of Diabetes mellitus; Hypertension; and Prostatitis (  2011). here For hospital follow-up for stroke acute sub cm infarction at the ventral pontomedullary junction just to the left of midline and small acute left frontal subcortical white matter infarction. MRA of the brain was negative. Carotid Dopplers unremarkable. 2-D echo EF 55%. Hemoglobin A1c 9.1. LDL 27  PLAN: Stressed the importance of management of risk factors to prevent further stroke Continue  aspirin for secondary stroke prevention Maintain strict control of hypertension with blood pressure goal below 130/90, today's reading 126/89 continue antihypertensive medications Control of diabetes with hemoglobin A1c below 6.5 followed by primary care most recent hemoglobin A1c 9.1 continue diabetic medications Cholesterol with LDL cholesterol less than 70, followed by primary care,  continue statin drug Lipitor Continue therapies and then home exercise program , Exercise by walking, slowly increase , eat healthy diet with whole grains,   fresh fruits and vegetables Discussed risk for recurrent stroke/ TIA and answered additional questions Will set up for 30 day cardiac event monitoring with cardiology Follow-up in 3 months This was a  visit requiring102mnutes of  high complexity with extensive review of history, hospital chart, counseling and answering questions. NDennie Bible [facility] APRN  [facility] [address] [place] [phone]

## 2016-02-18 NOTE — Progress Notes (Signed)
I reviewed above note and agree with the assessment and plan.  Rosalin Hawking, MD PhD Stroke Neurology 02/18/2016 4:38 PM

## 2016-02-18 NOTE — Patient Instructions (Addendum)
Stressed the importance of management of risk factors to prevent further stroke Continue  aspirin for secondary stroke prevention Maintain strict control of hypertension with blood pressure goal below 130/90, today's reading 126/89 continue antihypertensive medications Control of diabetes with hemoglobin A1c below 6.5 followed by primary care most recent hemoglobin A1c 9.1 continue diabetic medications Cholesterol with LDL cholesterol less than 70, followed by primary care,  continue statin drug Lipitor Continue therapies and then home exercise program , Exercise by walking, slowly increase , eat healthy diet with whole grains,  fresh fruits and vegetables Discussed risk for recurrent stroke/ TIA and answered additional questions Will set up for 30 day cardiac event monitoring with cardiology Follow-up in 3 months

## 2016-02-19 ENCOUNTER — Ambulatory Visit: Payer: Medicare Other | Admitting: Physical Therapy

## 2016-02-19 ENCOUNTER — Encounter: Payer: Self-pay | Admitting: *Deleted

## 2016-02-19 ENCOUNTER — Other Ambulatory Visit: Payer: Self-pay | Admitting: *Deleted

## 2016-02-19 ENCOUNTER — Ambulatory Visit: Payer: Medicare Other | Admitting: *Deleted

## 2016-02-19 DIAGNOSIS — I69351 Hemiplegia and hemiparesis following cerebral infarction affecting right dominant side: Secondary | ICD-10-CM

## 2016-02-19 DIAGNOSIS — M6281 Muscle weakness (generalized): Secondary | ICD-10-CM

## 2016-02-19 DIAGNOSIS — I639 Cerebral infarction, unspecified: Secondary | ICD-10-CM

## 2016-02-19 DIAGNOSIS — R2689 Other abnormalities of gait and mobility: Secondary | ICD-10-CM

## 2016-02-19 DIAGNOSIS — R278 Other lack of coordination: Secondary | ICD-10-CM

## 2016-02-19 NOTE — Therapy (Signed)
[facility] [address] [place], [place], [address] Phone: [phone]   Fax:  [phone]  Occupational Therapy Treatment  Patient Details  Name: Christian Clements MRN: [id number] Date of Birth: 11/09/1950 Referring Provider: Dr Rosalin Hawking  Encounter Date: 02/19/2016      OT End of Session - 02/19/16 0845    Visit Number 11   Number of Visits 16   Date for OT Re-Evaluation 03/05/16   Authorization Type [employer] [place] Medicare   Authorization - Visit Number 11   Authorization - Number of Visits 20   OT Start Time 0803   OT Stop Time 0845   OT Time Calculation (min) 42 min   Activity Tolerance Patient tolerated treatment well   Behavior During Therapy [facility] for tasks assessed/performed      Past Medical History:  Diagnosis Date  . Diabetes mellitus   . Hypertension   . Prostatitis 2011    History reviewed. No pertinent surgical history.  There were no vitals filed for this visit.      Subjective Assessment - 02/19/16 0809    Subjective  Pt spouse reports that he has recently returned to buttoning dress shirt independently and also returned to chaving his head at independent level.   Patient is accompained by: Family member   Pertinent History see Epic   Currently in Pain? No/denies   Pain Score 0-No pain   Multiple Pain Sites No                      OT Treatments/Exercises (OP) - 02/19/16 0001      ADLs   ADL Comments Discussed ADL's: Pt is currently Mod I buttoning dress shirt using R as dominant hand and shaving his head w/o reported difficulty.      Exercises   Exercises Neurological Re-education     Fine Motor Coordination   Other Fine Motor Exercises Large pegs on vertical surface to copy pattern using RUE only w/ 3 pegs dropping.      Neurological Re-education Exercises   Other Exercises 1 Red theraband for shoulder/proximal strengthening to include Rows,  shoulder flexion/extension, horizontal ABD, elbow flexion/extension.   Reciprocal Movements UBE level 5 x10 min w/ focus on mainting RPMs and reciprocal arm movements and strengthening.     Functional Reaching Activities   Mid Level Graded clothes pins. VC's for positioning and avoidance of compensatory patterns                OT Education - 02/19/16 0844    Education provided Yes   Education Details Cont with HEP as previously issued, focus on positioning for shoiulder strengthening and FM/ccordination.   Person(s) Educated Patient;Spouse   Methods Explanation;Demonstration;Verbal cues;Tactile cues   Comprehension Verbalized understanding;Returned demonstration          OT Short Term Goals - 02/07/16 1031      OT SHORT TERM GOAL #1   Title Pt will be Mod I HEP for AROM and coordination RUE   Time 4   Period Weeks   Status Achieved     OT SHORT TERM GOAL #2   Title Pt will demonstrate improved grip strength by 5# or more RUE as seen by JAMAR geip assessment   Baseline RUE 55# at Eval; 79# 02/07/16   Time 4   Period Weeks   Status Achieved     OT SHORT TERM GOAL #3   Title Pt will be Mod I stating 3  possible signs/symptoms/risk factors of CVA   Time 4   Period Weeks   Status Achieved           OT Long Term Goals - 01/09/16 1126      OT LONG TERM GOAL #1   Title Pt will be Mod I upgraded HEP for RUE   Time 8   Period Weeks   Status New     OT LONG TERM GOAL #2   Title Pt will be Mod I simple meal/snack prep in ADL kitchen while maintaining safety   Time 8   Period Weeks   Status New     OT LONG TERM GOAL #3   Title Pt will demonstrate AROM RUE WNL's w/o c/o joint tightness at end range for shoulder flexion (as compared to LUE)   Time 8   Period Weeks   Status New     OT LONG TERM GOAL #4   Title Pt will demonstrate improved MMT RUE of 4/5 or greater   Baseline MMT grossly 3/5 at eval on 01/09/16   Time 8   Period Weeks   Status New     OT  LONG TERM GOAL #5   Title Pt will demonstrate improved grip strength RUE to 60# or greater RUE   Baseline See Eval 01/09/16   Time 8   Period Weeks   Status New     Long Term Additional Goals   Additional Long Term Goals Yes     OT LONG TERM GOAL #6   Title Pt will demonstrate improved 9 Hole Peg test score R UE to less than 1 min for improved coordination & functional hand use   Baseline 01/09/16 1 min 43 seconds   Time 8   Period Weeks   Status New               Plan - 02/19/16 0849    Clinical Impression Statement Discussed progress and LTG's & anticipated d/c to HEP in next 1 - 1 1/2 weeks.  Pt progressing nicely. He benefits from focus on UE positioning during ther ex and strengthening activities.    Rehab Potential Good   OT Frequency 2x / week   OT Duration 8 weeks   OT Treatment/Interventions Self-care/ADL training;Fluidtherapy;DME and/or AE instruction;Splinting;Patient/family education;Balance training;Therapeutic exercises;Therapeutic exercise;Therapeutic activities;Cognitive remediation/compensation;Functional Mobility Training;Neuromuscular education;Manual Therapy;Visual/perceptual remediation/compensation   Plan Functional reach; NMR, WB activities/shoulder stabilization ex's RUE. Begin checking LTG's over next 1-2 visits.   Consulted and Agree with Plan of Care Patient;Family member/caregiver   Family Member Consulted Spouse      Patient will benefit from skilled therapeutic intervention in order to improve the following deficits and impairments:  Abnormal gait, Decreased coordination, Decreased range of motion, Impaired flexibility, Impaired sensation, Decreased activity tolerance, Cardiopulmonary status limiting activity, Decreased knowledge of precautions, Impaired UE functional use, Decreased knowledge of use of DME, Decreased balance, Decreased mobility, Decreased strength, Impaired vision/preception, Decreased cognition  Visit Diagnosis: Hemiplegia and  hemiparesis following cerebral infarction affecting right dominant side (HCC)  Muscle weakness (generalized)  Other lack of coordination    Problem List Patient Active Problem List   Diagnosis Date Noted  . Acute CVA (cerebrovascular accident) ([place]) 01/05/2016  . Right sided weakness 01/05/2016  . Dyslipidemia associated with type 2 diabetes mellitus ([place]) 01/05/2016  . Hypokalemia 01/05/2016  . Type 2 diabetes mellitus with vascular disease ([place]) 10/21/2006    Miquan Tandon Beth Dixon, OTR/L 02/19/2016, 8:57 AM  [facility] [phone]  [place], [place], [address] Phone: [phone]   Fax:  [phone]  Name: Christian Clements MRN: [id number] Date of Birth: 09-Nov-1950

## 2016-02-19 NOTE — Therapy (Signed)
[facility] [address] [place], [place], [address] Phone: [phone]   Fax:  [phone]  Physical Therapy Treatment  Patient Details  Name: Christian Clements MRN: [id number] Date of Birth: Sep 10, 1950 Referring Provider: Rosalin Hawking, MD  Encounter Date: 02/19/2016      PT End of Session - 02/19/16 1711    Visit Number 11   Number of Visits 17   Date for PT Re-Evaluation 03/10/16   Authorization Type [employer] Medicare   Authorization Time Period G-code every 10th visit   PT Start Time 0851   PT Stop Time 0935   PT Time Calculation (min) 44 min      Past Medical History:  Diagnosis Date  . Diabetes mellitus   . Hypertension   . Prostatitis 2011    No past surgical history on file.  There were no vitals filed for this visit.      Subjective Assessment - 02/19/16 1255    Subjective Pt reports R knee will snap at times if he is not thinking about controlling it   Patient is accompained by: Family member   Pertinent History Likes to be called "[name]".  PMH significant: HTN and DM II,   Patient Stated Goals "I want to be able to move around and do things I did before I had this stroke. I like to golf and I work out 5-6 times per week."   Currently in Pain? No/denies      TherEx:    Pt performed bil. Heel raises 10 reps; RLE only with bil. UE support on counter x 10 reps Amb. On tiptoes forward and backward along counter top 4 reps Sidestepping along counter with knees flexed, on tip toes for plantarflexor strengthening - 2 reps  R hip extension with flexed knee in quadriped position - pushing foot up toward ceiling 10 reps R knee flexion/extension in quadriped position with hip extended - 10 reps  R knee flexion prone - with 6# weight 2 sets 10 reps R hip extension with knee flexed at 90 degrees - no weight - 10 reps Step ups onto 6" step RLE for quad strengthening 10 reps Step down for R eccentric quad  strengthening 10 reps  Seated R knee flexion with blue theraband 10 reps             [facility] Adult PT Treatment/Exercise - 02/19/16 1313      Ambulation/Gait   Ambulation/Gait Assistance 6: Modified independent (Device/Increase time)   Ambulation/Gait Assistance Details focus on R knee control in stance   Ambulation Distance (Feet) 250 Feet   Assistive device None   Gait Pattern Step-through pattern   Ambulation Surface Level;Indoor                PT Education - 02/19/16 1710    Education provided Yes   Education Details gave unilateral heel lift; theraband extension exercise for knee control:  amb. on tip toes   Person(s) Educated Patient   Methods Explanation;Demonstration;Handout   Comprehension Verbalized understanding;Returned demonstration          PT Short Term Goals - 02/06/16 0827      PT SHORT TERM GOAL #1   Title Pt will be independent and verbalize understanding of initial HEP to continue progress made in therapy. (Target Date for all STGs: 02/07/16)   Baseline Met 11/15.   Time 4   Period Weeks   Status Achieved     PT SHORT TERM GOAL #2  Title Pt will improve FGA to 18/30 to indicate improved functional gait and mobility.   Baseline 11/15: FGA = 22/30   Time 4   Period Weeks   Status Achieved     PT SHORT TERM GOAL #3   Title Pt will improve 5 times sit to stand to < or = 12 sec indicating a decr risk of falls.   Baseline 11/15: 5x STS = 8.17 seconds   Time 4   Period Weeks   Status Achieved     PT SHORT TERM GOAL #4   Title Pt will negotiate 8 stairs with supervision and no UE support to incr safety negotiating stairs at home.   Baseline Met 11/15.   Time 4   Period Weeks   Status Achieved     PT SHORT TERM GOAL #5   Title Pt will ambulate 500' with mod I over level surfaces, ramps, and curbs to incr safey ambulating at home.   Baseline Met 11/15.   Time 4   Period Weeks   Status Achieved           PT Long Term Goals -  01/10/16 1705      PT LONG TERM GOAL #1   Title Pt will be independent and verbalize understanding of HEP and on-going fitness plan to maintain progress made in therapy and his active lifestyle. (Target Date for all LTGs: 03/06/16)   Time 8   Period Weeks   Status New     PT LONG TERM GOAL #2   Title Pt will improve FGA score to > or = 23/30 to indicate decr fall risk and improved functional mobility.   Time 8   Period Weeks   Status New     PT LONG TERM GOAL #3   Title Pt will improve gait speed to > 2.62 ft/sec to indicate status of community ambulator.   Time 8   Period Weeks   Status New     PT LONG TERM GOAL #4   Title Pt will negotiate 15 stairs with mod I and no UE support to improve safety with stairs at home.   Time 8   Period Weeks   Status New     PT LONG TERM GOAL #5   Title Pt will ambulate 1020f outdoors over unlevel surfaces, pavement, gravel, grass, ramps, and curbs with mod I to incr safety while ambulating in the community.   Time 8   Period Weeks   Status New               Plan - 02/19/16 1711    Clinical Impression Statement Pt has weakness in R gastrocs (3+/5) and R hamstring weakness which contributes to gait deviations and R knee hyperextension in stance.  Pt also has decreased eccentric quad strength in RLE.  Rehab Potential Excellent   Clinical Impairments Affecting Rehab Potential HTN, Type 2 DM   PT Frequency 2x / week   PT Duration 8 weeks   PT Treatment/Interventions ADLs/Self Care Home Management;Functional mobility training;Stair training;Gait training;Therapeutic activities;Therapeutic exercise;Balance training;Neuromuscular re-education;Patient/family education;Manual techniques;Energy conservation   PT Next Visit Plan Cont R plantarflexor and hamstring strengthening/knee control exercises   Consulted  and Agree with Plan of Care Patient;Family member/caregiver   Family Member Consulted Wife      Patient will benefit from skilled therapeutic intervention in order to improve the following deficits and impairments:  Abnormal gait, Decreased activity tolerance, Decreased knowledge of precautions, Decreased endurance, Decreased strength, Impaired perceived functional ability, Impaired sensation, Improper body mechanics, Impaired UE functional use, Decreased coordination  Visit Diagnosis: Other abnormalities of gait and mobility  Muscle weakness (generalized)     Problem List Patient Active Problem List   Diagnosis Date Noted  . Acute CVA (cerebrovascular accident) ([place]) 01/05/2016  . Right sided weakness 01/05/2016  . Dyslipidemia associated with type 2 diabetes mellitus ([place]) 01/05/2016  . Hypokalemia 01/05/2016  . Type 2 diabetes mellitus with vascular disease ([place]) 10/21/2006    Mykaila Blunck, [place], PT 02/19/2016, 5:20 PM  [facility] [address] [place], [place], [address] Phone: [phone]   Fax:  [phone]  Name: Treavon Castilleja MRN: [id number] Date of Birth: 11/04/1950

## 2016-02-19 NOTE — Patient Instructions (Signed)
Heel Raise: Unilateral (Standing)    Balance on left foot, then rise on ball of foot. Repeat _10-15__ times per set. Do _2___ sets per session. Do _2___ sessions per day.  http://orth.exer.us/40   Copyright  VHI. All rights reserved.    Pt was also given amb on tiptoes:  Knee extension control exercise with blue theraband

## 2016-02-20 ENCOUNTER — Ambulatory Visit: Payer: Medicare Other | Admitting: Physical Therapy

## 2016-02-20 ENCOUNTER — Ambulatory Visit: Payer: Medicare Other | Admitting: *Deleted

## 2016-02-20 ENCOUNTER — Encounter: Payer: Self-pay | Admitting: *Deleted

## 2016-02-20 ENCOUNTER — Encounter: Payer: Self-pay | Admitting: Physical Therapy

## 2016-02-20 DIAGNOSIS — I69351 Hemiplegia and hemiparesis following cerebral infarction affecting right dominant side: Secondary | ICD-10-CM

## 2016-02-20 DIAGNOSIS — R278 Other lack of coordination: Secondary | ICD-10-CM

## 2016-02-20 DIAGNOSIS — R2689 Other abnormalities of gait and mobility: Secondary | ICD-10-CM

## 2016-02-20 DIAGNOSIS — M6281 Muscle weakness (generalized): Secondary | ICD-10-CM

## 2016-02-20 NOTE — Therapy (Signed)
[facility] [address] [place], [place], [address] Phone: [phone]   Fax:  [phone]  Physical Therapy Treatment  Patient Details  Name: Christian Clements MRN: [id number] Date of Birth: Jul 27, 1950 Referring Provider: Rosalin Hawking, MD  Encounter Date: 02/20/2016      PT End of Session - 02/20/16 1141    Visit Number 12   Number of Visits 17   Date for PT Re-Evaluation 03/10/16   Authorization Type [employer] Medicare   Authorization Time Period G-code every 10th visit   PT Start Time 0846   PT Stop Time 0926   PT Time Calculation (min) 40 min   Equipment Utilized During Treatment Gait belt   Activity Tolerance Patient tolerated treatment well   Behavior During Therapy [facility] for tasks assessed/performed      Past Medical History:  Diagnosis Date  . Diabetes mellitus   . Hypertension   . Prostatitis 2011    History reviewed. No pertinent surgical history.  There were no vitals filed for this visit.      Subjective Assessment - 02/20/16 0848    Subjective Patient reports feeling stiff after yesterdays work out   Patient is accompained by: Family member   Pertinent History Likes to be called "Christian Clements".  PMH significant: HTN and DM II,   Patient Stated Goals "I want to be able to move around and do things I did before I had this stroke. I like to golf and I work out 5-6 times per week."   Currently in Pain? No/denies      High Level Balance Activities  Balance in corner; on airx pad; EC static standing, progressing to head turns, nods, and diagonals; 3x 30 seconds each; Frequent LOB; Min assist for balance and safety.  Large rocker board both ways in parallel bars; static standing with EC no movement; EO lateral & A/P rocking; Visual, verbal and tactile cues for even weight distribution.   Single taps to 5 foam bubbles placed in semi circle around patient, progressing to double taps;  3x each LE; Intermittent  UE support in parallel bars; verbal cues for sequencing; min guard        PT Short Term Goals - 02/06/16 0827      PT SHORT TERM GOAL #1   Title Pt will be independent and verbalize understanding of initial HEP to continue progress made in therapy. (Target Date for all STGs: 02/07/16)   Baseline Met 11/15.   Time 4   Period Weeks   Status Achieved     PT SHORT TERM GOAL #2   Title Pt will improve FGA to 18/30 to indicate improved functional gait and mobility.   Baseline 11/15: FGA = 22/30   Time 4   Period Weeks   Status Achieved     PT SHORT TERM GOAL #3   Title Pt will improve 5 times sit to stand to < or = 12 sec indicating a decr risk of falls.   Baseline 11/15: 5x STS = 8.17 seconds   Time 4   Period Weeks   Status Achieved     PT SHORT TERM GOAL #4   Title Pt will negotiate 8 stairs with supervision and no UE support to incr safety negotiating stairs at home.   Baseline Met 11/15.   Time 4   Period Weeks   Status Achieved     PT SHORT TERM GOAL #5   Title Pt will ambulate 500' with mod I over level  surfaces, ramps, and curbs to incr safey ambulating at home.   Baseline Met 11/15.   Time 4   Period Weeks   Status Achieved           PT Long Term Goals - 01/10/16 1705      PT LONG TERM GOAL #1   Title Pt will be independent and verbalize understanding of HEP and on-going fitness plan to maintain progress made in therapy and his active lifestyle. (Target Date for all LTGs: 03/06/16)   Time 8   Period Weeks   Status New     PT LONG TERM GOAL #2   Title Pt will improve FGA score to > or = 23/30 to indicate decr fall risk and improved functional mobility.   Time 8   Period Weeks   Status New     PT LONG TERM GOAL #3   Title Pt will improve gait speed to > 2.62 ft/sec to indicate status of community ambulator.   Time 8   Period Weeks   Status New     PT LONG TERM GOAL #4   Title Pt will negotiate 15 stairs with mod I and no UE support to improve  safety with stairs at home.   Time 8   Period Weeks   Status New     PT LONG TERM GOAL #5   Title Pt will ambulate 1029f outdoors over unlevel surfaces, pavement, gravel, grass, ramps, and curbs with mod I to incr safety while ambulating in the community.   Time 8   Period Weeks   Status New           Plan - 02/20/16 1142    Clinical Impression Statement Patient continues to be challenged by balance activities without visual impute, however is showing improvement by progressing to higher level balance activities. He is progressing towards goals and should benefit from continued PT to achieve unmet goals.   Rehab Potential Excellent   Clinical Impairments Affecting Rehab Potential HTN, Type 2 DM   PT Frequency 2x / week   PT Duration 8 weeks   PT Treatment/Interventions ADLs/Self Care Home Management;Functional mobility training;Stair training;Gait training;Therapeutic activities;Therapeutic exercise;Balance training;Neuromuscular re-education;Patient/family education;Manual techniques;Energy conservation   PT Next Visit Plan Cont R plantarflexor and hamstring strengthening/knee control exercises   PT Home Exercise Plan bridging, water activites, sit<>stands, Tband-resisted R TKE, tandem gait   Consulted and Agree with Plan of Care Patient;Family member/caregiver   Family Member Consulted Wife      Patient will benefit from skilled therapeutic intervention in order to improve the following deficits and impairments:  Abnormal gait, Decreased activity tolerance, Decreased knowledge of precautions, Decreased endurance, Decreased strength, Impaired perceived functional ability, Impaired sensation, Improper body mechanics, Impaired UE functional use, Decreased coordination  Visit Diagnosis: Other abnormalities of gait and mobility  Muscle weakness (generalized)  Hemiplegia and hemiparesis following cerebral infarction affecting right dominant side ([facility]   Problem List Patient  Active Problem List   Diagnosis Date Noted  . Acute CVA (cerebrovascular accident) ([place] 01/05/2016  . Right sided weakness 01/05/2016  . Dyslipidemia associated with type 2 diabetes mellitus ([place] 01/05/2016  . Hypokalemia 01/05/2016  . Type 2 diabetes mellitus with vascular disease ([place] 10/21/2006    HBenjiman Core SFort Seneca11/29/2017, 11:48 AM  [place] [address]Phone: [phone]  Fax:  [phone] Name: Christian KruegerMRN: [id number] of Birth: [birth year]

## 2016-02-20 NOTE — Therapy (Signed)
[facility] [address] [place], [place], [address] Phone: [phone]   Fax:  [phone]  Occupational Therapy Treatment  Patient Details  Name: Christian Clements MRN: [id number] Date of Birth: 1951-02-01 Referring Provider: Dr Rosalin Hawking  Encounter Date: 02/20/2016      OT End of Session - 02/20/16 1035    Visit Number 12   Number of Visits 16   Date for OT Re-Evaluation 03/05/16   Authorization Type [employer] [place] Medicare   Authorization - Visit Number 12   Authorization - Number of Visits 20   OT Start Time [phone]   OT Stop Time 1013   OT Time Calculation (min) 42 min   Activity Tolerance Patient tolerated treatment well   Behavior During Therapy [facility] for tasks assessed/performed      Past Medical History:  Diagnosis Date  . Diabetes mellitus   . Hypertension   . Prostatitis 2011    History reviewed. No pertinent surgical history.  There were no vitals filed for this visit.      Subjective Assessment - 02/20/16 0936    Subjective  Pt denies pain. Reports Mod I HEP   Patient is accompained by: Family member   Pertinent History see Epic   Currently in Pain? No/denies   Pain Score 0-No pain            [facility] OT Assessment - 02/20/16 0001      ROM / Strength   AROM / PROM / Strength Strength     AROM   Overall AROM  Within functional limits for tasks performed  Some stiffness end range for ER and shoulder flexion RUE     Strength   Overall Strength Within functional limits for tasks performed;Other (comment)  MMT RUE has improved to 4-/5 via gross assessment in clinic     Hand Function   Right Hand Grip (lbs) 75   Right Hand 3 Point Pinch 15.5 lbs                  OT Treatments/Exercises (OP) - 02/20/16 0001      Exercises   Exercises Neurological Re-education     Fine Motor Coordination   Other Fine Motor Exercises Small pegs on vertical surface to copy  pattern using RUE Increased time for task.   Other Fine Motor Exercises Increased reisitance of theraputty from red to green. Pt performed grip and pinch exercises and was instructed to use green at home instead of red at this time to assist with increasing grip strength RUE.     Neurological Re-education Exercises   Other Information Pt/spouse verbally educated in focus on scapular stabilization ex's and WB techniques while focusing on avoidance of compensatory patterns and increasing strength proximally.  Both verbalized understanding of this in clinic today.   Shoulder External Rotation Right;10 reps;Standing;Other (comment)  Towel stretch in standing for RUE ER and stretch, vc/tc req   Other Exercises 1 Reviewed verbally   Other Weight-Bearing Exercises 2 Standing: modified push ups at wall for scapula stability with min compensations when fatigued   Reciprocal Movements UBE level 6 x10 min w/ focus on mainting RPMs and reciprocal arm movements and strengthening.                OT Education - 02/20/16 1034    Education provided Yes   Education Details Focus on shoulder stanbilization and WB RUE, increased resistance of putty to green for grip/pinches   Person(s) Educated  Patient;Spouse   Methods Explanation;Demonstration;Tactile cues;Verbal cues   Comprehension Verbalized understanding;Returned demonstration          OT Short Term Goals - 02/07/16 1031      OT SHORT TERM GOAL #1   Title Pt will be Mod I HEP for AROM and coordination RUE   Time 4   Period Weeks   Status Achieved     OT SHORT TERM GOAL #2   Title Pt will demonstrate improved grip strength by 5# or more RUE as seen by JAMAR geip assessment   Baseline RUE 55# at Eval; 79# 02/07/16   Time 4   Period Weeks   Status Achieved     OT SHORT TERM GOAL #3   Title Pt will be Mod I stating 3 possible signs/symptoms/risk factors of CVA   Time 4   Period Weeks   Status Achieved           OT Long Term  Goals - 02/20/16 1039      OT LONG TERM GOAL #1   Title Pt will be Mod I upgraded HEP for RUE   Time 8   Period Weeks   Status On-going     OT LONG TERM GOAL #2   Title Pt will be Mod I simple meal/snack prep in ADL kitchen while maintaining safety   Time 8   Period Weeks   Status On-going     OT LONG TERM GOAL #3   Title Pt will demonstrate AROM RUE WNL's w/o c/o joint tightness at end range for shoulder flexion (as compared to LUE)   Time 8   Period Weeks   Status On-going     OT LONG TERM GOAL #4   Title Pt will demonstrate improved MMT RUE of 4/5 or greater   Baseline MMT grossly 3/5 at eval on 01/09/16; MMT RUE 4-/5 02/20/16   Time 8   Period Weeks   Status On-going     OT LONG TERM GOAL #5   Title Pt will demonstrate improved grip strength RUE to 60# or greater RUE   Baseline See Eval 01/09/16; 02/20/16 = 75#   Time 8   Period Weeks   Status Achieved     OT LONG TERM GOAL #6   Title Pt will demonstrate improved 9 Hole Peg test score R UE to less than 1 min for improved coordination & functional hand use   Baseline 01/09/16 1 min 43 seconds   Time 8   Period Weeks   Status On-going               Plan - 02/20/16 1036    Clinical Impression Statement Began checking LTG's today. Pt improving with RUE FM/coord and strength however, he should benefit from cont shoulder stabilization/proximal strengthening, WB and grip strength RUE.   Rehab Potential Good   OT Frequency 2x / week   OT Duration 8 weeks   OT Treatment/Interventions Self-care/ADL training;Fluidtherapy;DME and/or AE instruction;Splinting;Patient/family education;Balance training;Therapeutic exercises;Therapeutic exercise;Therapeutic activities;Cognitive remediation/compensation;Functional Mobility Training;Neuromuscular education;Manual Therapy;Visual/perceptual remediation/compensation   Plan Cont. checking LTG's; WB/Shoulder stabilization/proximal strengthening RUE. Anticipated d/c next 2 visits.    Consulted and Agree with Plan of Care Patient;Family member/caregiver   Family Member Consulted Spouse      Patient will benefit from skilled therapeutic intervention in order to improve the following deficits and impairments:  Abnormal gait, Decreased coordination, Decreased range of motion, Impaired flexibility, Impaired sensation, Decreased activity tolerance, Cardiopulmonary status limiting activity, Decreased knowledge of precautions, Impaired UE  functional use, Decreased knowledge of use of DME, Decreased balance, Decreased mobility, Decreased strength, Impaired vision/preception, Decreased cognition  Visit Diagnosis: Muscle weakness (generalized)  Hemiplegia and hemiparesis following cerebral infarction affecting right dominant side ([place])  Other lack of coordination    Problem List Patient Active Problem List   Diagnosis Date Noted  . Acute CVA (cerebrovascular accident) ([place]) 01/05/2016  . Right sided weakness 01/05/2016  . Dyslipidemia associated with type 2 diabetes mellitus ([place]) 01/05/2016  . Hypokalemia 01/05/2016  . Type 2 diabetes mellitus with vascular disease ([place]) 10/21/2006    Merric Yost Ardath Sax, OTR/L 02/20/2016, 10:42 AM  [facility] [address] [place], [place], [address] Phone: [phone]   Fax:  [phone]  Name: Tayvon Yousuf MRN: [id number] Date of Birth: 05/19/1950

## 2016-02-25 ENCOUNTER — Ambulatory Visit: Payer: Medicare Other | Attending: Neurology | Admitting: *Deleted

## 2016-02-25 ENCOUNTER — Ambulatory Visit: Payer: Medicare Other | Admitting: Physical Therapy

## 2016-02-25 ENCOUNTER — Encounter: Payer: Self-pay | Admitting: *Deleted

## 2016-02-25 ENCOUNTER — Encounter: Payer: Self-pay | Admitting: Physical Therapy

## 2016-02-25 DIAGNOSIS — M6281 Muscle weakness (generalized): Secondary | ICD-10-CM | POA: Diagnosis present

## 2016-02-25 DIAGNOSIS — I69351 Hemiplegia and hemiparesis following cerebral infarction affecting right dominant side: Secondary | ICD-10-CM | POA: Diagnosis present

## 2016-02-25 DIAGNOSIS — R2689 Other abnormalities of gait and mobility: Secondary | ICD-10-CM

## 2016-02-25 DIAGNOSIS — R2681 Unsteadiness on feet: Secondary | ICD-10-CM | POA: Diagnosis present

## 2016-02-25 DIAGNOSIS — R278 Other lack of coordination: Secondary | ICD-10-CM | POA: Diagnosis present

## 2016-02-25 NOTE — Therapy (Signed)
[facility] [address] [place], [place], [address] Phone: [phone]   Fax:  [phone]  Occupational Therapy Treatment  Patient Details  Name: Christian Clements MRN: [id number] Date of Birth: [birth year] Referring Provider: Dr Rosalin Hawking  Encounter Date: 02/25/2016      OT End of Session - 02/25/16 1028    Visit Number 13   Number of Visits 16   Date for OT Re-Evaluation 03/05/16   Authorization Type [employer] [place] Medicare   Authorization - Visit Number 13   Authorization - Number of Visits 20   OT Start Time 0802   OT Stop Time 0845   OT Time Calculation (min) 43 min   Activity Tolerance Patient tolerated treatment well   Behavior During Therapy [facility] for tasks assessed/performed      Past Medical History:  Diagnosis Date  . Diabetes mellitus   . Hypertension   . Prostatitis 2011    History reviewed. No pertinent surgical history.  There were no vitals filed for this visit.      Subjective Assessment - 02/25/16 0804    Subjective  Pt reports that he played golf with friends yesterday "Played 18 holes" Denies pain, says he was a little stiff while playing.   Patient is accompained by: Family member   Pertinent History see Epic   Currently in Pain? No/denies   Pain Score 0-No pain   Multiple Pain Sites No            [facility] OT Assessment - 02/25/16 0001      Observation/Other Assessments   Other Surveys  Select     Coordination   9 Hole Peg Test Right   Right 9 Hole Peg Test 53.05 seconds   Box and Blocks Right = 48     Hand Function   Right Hand Grip (lbs) 75                  OT Treatments/Exercises (OP) - 02/25/16 0001      ADLs   ADL Comments ADL kitchen while reaching into upper cabinets to retrieve items off of shelves. Pt able to reach overhead with right and remove light items, overhead bilateral UE's to remove heavier objects. Also demonstrated reching  into refridgerator & upper/lower shelves & cabinets.    ADL Education Given Yes  Pt ed to use RUE for daily activities, examples given     Exercises   Exercises Neurological Re-education     Fine Motor Coordination   Fine Motor Coordination --  9 hole peg test; grip and pinches     Neurological Re-education Exercises   Shoulder Flexion Right  Shoulder flexion stretch at wall and hold x15 seconds x5 rep   Reciprocal Movements UBE level 6 x8 min w/ focus on mainting RPMs and reciprocal arm movements and strengthening.     Weight Bearing Technique   Weight Bearing Technique Yes  Bilateral UE chair push ups & wall push ups x10 reps ea;                OT Education - 02/25/16 1022    Education provided Yes   Education Details Stressed focus on use of RUE for daily activities opening doors, reching into cabinets etc. as well as shoulder stabilization, WB.   Person(s) Educated Patient;Spouse   Methods Explanation;Demonstration;Verbal cues;Tactile cues   Comprehension Verbalized understanding;Returned demonstration          OT Short Term Goals - 02/07/16 1031  OT SHORT TERM GOAL #1   Title Pt will be Mod I HEP for AROM and coordination RUE   Time 4   Period Weeks   Status Achieved     OT SHORT TERM GOAL #2   Title Pt will demonstrate improved grip strength by 5# or more RUE as seen by JAMAR geip assessment   Baseline RUE 55# at Eval; 79# 02/07/16   Time 4   Period Weeks   Status Achieved     OT SHORT TERM GOAL #3   Title Pt will be Mod I stating 3 possible signs/symptoms/risk factors of CVA   Time 4   Period Weeks   Status Achieved           OT Long Term Goals - 02/25/16 1031      OT LONG TERM GOAL #1   Title Pt will be Mod I upgraded HEP for RUE   Time 8   Period Weeks   Status Achieved     OT LONG TERM GOAL #2   Title Pt will be Mod I simple meal/snack prep in ADL kitchen while maintaining safety   Time 8   Period Weeks   Status Achieved      OT LONG TERM GOAL #3   Title Pt will demonstrate AROM RUE WNL's w/o c/o joint tightness at end range for shoulder flexion (as compared to LUE)   Period Weeks   Status On-going     OT LONG TERM GOAL #4   Title Pt will demonstrate improved MMT RUE of 4/5 or greater   Baseline MMT grossly 3/5 at eval on 01/09/16; MMT RUE 4-/5 02/20/16   Time 8   Period Weeks   Status On-going     OT LONG TERM GOAL #5   Title Pt will demonstrate improved grip strength RUE to 60# or greater RUE   Baseline See Eval 01/09/16; 02/20/16 = 75#   Time 8   Period Weeks   Status Achieved     OT LONG TERM GOAL #6   Title Pt will demonstrate improved 9 Hole Peg test score R UE to less than 1 min for improved coordination & functional hand use   Baseline 01/09/16 1 min 43 seconds; see 02/25/16 results in note   Time 8   Period Weeks   Status Achieved               Plan - 02/25/16 1028    Clinical Impression Statement Continued checking LTG's. Pt cont to exhibit decreased FM/coord and srength. Recommend cont shoulder stabilization/proximal RUE strength & WB.   Rehab Potential Good   OT Frequency 2x / week   OT Duration 8 weeks   OT Treatment/Interventions Self-care/ADL training;Fluidtherapy;DME and/or AE instruction;Splinting;Patient/family education;Balance training;Therapeutic exercises;Therapeutic exercise;Therapeutic activities;Cognitive remediation/compensation;Functional Mobility Training;Neuromuscular education;Manual Therapy;Visual/perceptual remediation/compensation   Plan Cont assessing LTG's, discuss cont therapy w/ pt for RUE proximal strengthening vs d/c to independent home program or decrease visits to 1x/week.   Consulted and Agree with Plan of Care Patient;Family member/caregiver   Family Member Consulted Spouse      Patient will benefit from skilled therapeutic intervention in order to improve the following deficits and impairments:  Abnormal gait, Decreased coordination, Decreased  range of motion, Impaired flexibility, Impaired sensation, Decreased activity tolerance, Cardiopulmonary status limiting activity, Decreased knowledge of precautions, Impaired UE functional use, Decreased knowledge of use of DME, Decreased balance, Decreased mobility, Decreased strength, Impaired vision/preception, Decreased cognition  Visit Diagnosis: Hemiplegia and hemiparesis following cerebral infarction affecting  right dominant side ([place])  Muscle weakness (generalized)  Other lack of coordination    Problem List Patient Active Problem List   Diagnosis Date Noted  . Acute CVA (cerebrovascular accident) ([place]) 01/05/2016  . Right sided weakness 01/05/2016  . Dyslipidemia associated with type 2 diabetes mellitus ([place]) 01/05/2016  . Hypokalemia 01/05/2016  . Type 2 diabetes mellitus with vascular disease ([place]) 10/21/2006    [place], Amy Ardath Sax, OTR/L 02/25/2016, 10:34 AM  [facility] [address] [place], [place], [address] Phone: [phone]   Fax:  [phone]  Name: Christian Clements MRN: [id number] Date of Birth: May 03, 1950

## 2016-02-25 NOTE — Therapy (Signed)
[facility] [address] [place], [place], [address] Phone: [phone]   Fax:  [phone]  Physical Therapy Treatment  Patient Details  Name: Christian Clements MRN: [id number] Date of Birth: 1951/01/01 Referring Provider: Rosalin Hawking, MD  Encounter Date: 02/25/2016      PT End of Session - 02/25/16 1219    Visit Number 13   Number of Visits 17   Date for PT Re-Evaluation 03/10/16   Authorization Type [employer] Medicare   Authorization Time Period G-code every 10th visit   PT Start Time 0847   PT Stop Time 0928   PT Time Calculation (min) 41 min   Equipment Utilized During Treatment Gait belt   Activity Tolerance Patient tolerated treatment well   Behavior During Therapy [facility] for tasks assessed/performed      Past Medical History:  Diagnosis Date  . Diabetes mellitus   . Hypertension   . Prostatitis 2011    History reviewed. No pertinent surgical history.  There were no vitals filed for this visit.      Subjective Assessment - 02/25/16 0851    Subjective Patient reports playing golf yesterday; he reports a few stumbles, but no falls or pain.    Patient is accompained by: Family member   Pertinent History Likes to be called "Christian Clements".  PMH significant: HTN and DM II,   Patient Stated Goals "I want to be able to move around and do things I did before I had this stroke. I like to golf and I work out 5-6 times per week."   Currently in Pain? No/denies       High Level Balance  Standing across blue foam balance beam; EC 3x 30 seconds; occasional LOB with UE support to regain balance; Progresses to alternating A&P steps off foam beam with static LE remaining on beam. 20 reps x2; Intermittent UE support needed to maintain balance.  Therapeutic Exercise  Quadruped with R hip extended; performing knee flexion and extension; Min assist to maintain hip extension; VC and demo for technique; 5 reps x3  Prone R knee  flexion & extension w/ 7lbs; VC and min assist for controled knee extension; 10 reps x2  Steps off 6" step leading with the heel for eccentric loading; Alternating lead leg; VC for technique; 20 reps  Lateral steps up and over 6" step; Demo for technique; 20 reps  Terminal knee extension with red theraband; 10 reps x3;        PT Education - 02/25/16 1224    Education provided Yes   Education Details Advised pt to increase the resistance band for terminal knee extension when performing at home.   Person(s) Educated Patient;Spouse   Methods Explanation;Demonstration   Comprehension Verbalized understanding;Need further instruction          PT Short Term Goals - 02/06/16 0827      PT SHORT TERM GOAL #1   Title Pt will be independent and verbalize understanding of initial HEP to continue progress made in therapy. (Target Date for all STGs: 02/07/16)   Baseline Met 11/15.   Time 4   Period Weeks   Status Achieved     PT SHORT TERM GOAL #2   Title Pt will improve FGA to 18/30 to indicate improved functional gait and mobility.   Baseline 11/15: FGA = 22/30   Time 4   Period Weeks   Status Achieved     PT SHORT TERM GOAL #3   Title Pt will improve 5  times sit to stand to < or = 12 sec indicating a decr risk of falls.   Baseline 11/15: 5x STS = 8.17 seconds   Time 4   Period Weeks   Status Achieved     PT SHORT TERM GOAL #4   Title Pt will negotiate 8 stairs with supervision and no UE support to incr safety negotiating stairs at home.   Baseline Met 11/15.   Time 4   Period Weeks   Status Achieved     PT SHORT TERM GOAL #5   Title Pt will ambulate 500' with mod I over level surfaces, ramps, and curbs to incr safey ambulating at home.   Baseline Met 11/15.   Time 4   Period Weeks   Status Achieved           PT Long Term Goals - 01/10/16 1705      PT LONG TERM GOAL #1   Title Pt will be independent and verbalize understanding of HEP and on-going fitness plan to  maintain progress made in therapy and his active lifestyle. (Target Date for all LTGs: 03/06/16)   Time 8   Period Weeks   Status New     PT LONG TERM GOAL #2   Title Pt will improve FGA score to > or = 23/30 to indicate decr fall risk and improved functional mobility.   Time 8   Period Weeks   Status New     PT LONG TERM GOAL #3   Title Pt will improve gait speed to > 2.62 ft/sec to indicate status of community ambulator.   Time 8   Period Weeks   Status New     PT LONG TERM GOAL #4   Title Pt will negotiate 15 stairs with mod I and no UE support to improve safety with stairs at home.   Time 8   Period Weeks   Status New     PT LONG TERM GOAL #5   Title Pt will ambulate 1027f outdoors over unlevel surfaces, pavement, gravel, grass, ramps, and curbs with mod I to incr safety while ambulating in the community.   Time 8   Period Weeks   Status New           Plan - 02/25/16 1220    Clinical Impression Statement Patient's balance is showing improvement as he is performing higher level balance activities without visual impute. He is making progress towards goals and should benefit from continued PT to achieve unmet goals.   Rehab Potential Excellent   Clinical Impairments Affecting Rehab Potential HTN, Type 2 DM   PT Frequency 2x / week   PT Duration 8 weeks   PT Treatment/Interventions ADLs/Self Care Home Management;Functional mobility training;Stair training;Gait training;Therapeutic activities;Therapeutic exercise;Balance training;Neuromuscular re-education;Patient/family education;Manual techniques;Energy conservation   PT Next Visit Plan Cont R plantarflexor and hamstring strengthening/knee control exercises; balance without visual imput   PT Home Exercise Plan bridging, water activites, sit<>stands, Tband-resisted R TKE, tandem gait   Consulted and Agree with Plan of Care Patient;Family member/caregiver   Family Member Consulted Wife      Patient will benefit from  skilled therapeutic intervention in order to improve the following deficits and impairments:  Abnormal gait, Decreased activity tolerance, Decreased knowledge of precautions, Decreased endurance, Decreased strength, Impaired perceived functional ability, Impaired sensation, Improper body mechanics, Impaired UE functional use, Decreased coordination  Visit Diagnosis: Hemiplegia and hemiparesis following cerebral infarction affecting right dominant side ([facility])  Muscle weakness (generalized)  Other abnormalities  of gait and mobility     Problem List Patient Active Problem List   Diagnosis Date Noted  . Acute CVA (cerebrovascular accident) ([place]) 01/05/2016  . Right sided weakness 01/05/2016  . Dyslipidemia associated with type 2 diabetes mellitus ([place]) 01/05/2016  . Hypokalemia 01/05/2016  . Type 2 diabetes mellitus with vascular disease ([place]) 10/21/2006    Benjiman Core, [place] 02/25/2016, 12:25 PM  [facility] [address] [place], [place], [address] Phone: [phone]   Fax:  [phone]  Name: Havier Deeb MRN: [id number] Date of Birth: 1950-06-26

## 2016-02-27 ENCOUNTER — Ambulatory Visit: Payer: Medicare Other | Admitting: *Deleted

## 2016-02-27 ENCOUNTER — Encounter: Payer: Self-pay | Admitting: Physical Therapy

## 2016-02-27 ENCOUNTER — Ambulatory Visit: Payer: Medicare Other | Admitting: Physical Therapy

## 2016-02-27 DIAGNOSIS — I69351 Hemiplegia and hemiparesis following cerebral infarction affecting right dominant side: Secondary | ICD-10-CM

## 2016-02-27 DIAGNOSIS — M6281 Muscle weakness (generalized): Secondary | ICD-10-CM

## 2016-02-27 DIAGNOSIS — R278 Other lack of coordination: Secondary | ICD-10-CM

## 2016-02-27 DIAGNOSIS — R2689 Other abnormalities of gait and mobility: Secondary | ICD-10-CM

## 2016-02-27 NOTE — Therapy (Signed)
[facility] [address] [place], [place], [address] Phone: [phone]   Fax:  [phone]  Occupational Therapy Treatment  Patient Details  Name: Christian Clements MRN: [id number] Date of Birth: 07-Jul-1950 Referring Provider: Dr Rosalin Hawking  Encounter Date: 02/27/2016      OT End of Session - 02/27/16 1126    Visit Number 14   Number of Visits 24  G code every 10th visit   Date for OT Re-Evaluation 03/26/16   Authorization Type [employer] Every 10 visits   Authorization - Visit Number 14   Authorization - Number of Visits 24   OT Start Time 0932   OT Stop Time 1013   OT Time Calculation (min) 41 min   Activity Tolerance Patient tolerated treatment well   Behavior During Therapy [facility] for tasks assessed/performed      Past Medical History:  Diagnosis Date  . Diabetes mellitus   . Hypertension   . Prostatitis 2011    No past surgical history on file.  There were no vitals filed for this visit.                    OT Treatments/Exercises (OP) - 02/27/16 0001      ADLs   ADL Comments Discussed remaining goals and added therapy visits to cont toward RUE stabilization and strengthening for additional 4 weeks. See LTG's, will send re-certification to MD today.,     Exercises   Exercises Neurological Re-education     Fine Motor Coordination   Other Fine Motor Exercises Large pegs on vertical srface to copy pattern w/ RUE followed by removing 3-4 at a time and placing only 1 at a time into container.      Neurological Re-education Exercises   Other Exercises 1 Standing at wall for shoulder flexion and ABD ex's hold x5 count 2 sets of 10 each for shoulder A/AROM and stretch   Other Weight-Bearing Exercises 1 Quadraped: A-P wt shifts, followed by cat/cow stretch for scapula stability. Pt required min cues for elbow extension. Modified push ups in quadraped x2 sets 10 with  rest break between sets and vc's for positioning. Quadraped on mat while raising opposite arm/leg and holding for 3 count x 10 reps ea side.    Other Weight-Bearing Exercises 2 Standing: modified push ups at wall for scapula stability with min compensations when fatigued   Reciprocal Movements UBE level 6 x8 min w/ focus on mainting RPMs and reciprocal arm movements and strengthening.                OT Education - 02/27/16 1124    Education provided Yes   Education Details Reviewed home program and LTG's for OT - pt/spouse wish to cont out pt OT to cont RUE strength and functional use for 4 weeks as pt cont to demonstrate gains.   Person(s) Educated Patient;Spouse   Methods Explanation;Demonstration   Comprehension Verbalized understanding          OT Short Term Goals - 02/07/16 1031      OT SHORT TERM GOAL #1   Title Pt will be Mod I HEP for AROM and coordination RUE   Time 4   Period Weeks   Status Achieved     OT SHORT TERM GOAL #2   Title Pt will demonstrate improved grip strength by 5# or more RUE as seen by JAMAR geip assessment   Baseline RUE 55# at Eval; 79# 02/07/16  Time 4   Period Weeks   Status Achieved     OT SHORT TERM GOAL #3   Title Pt will be Mod I stating 3 possible signs/symptoms/risk factors of CVA   Time 4   Period Weeks   Status Achieved           OT Long Term Goals - 02/27/16 1133      OT LONG TERM GOAL #1   Title Pt will be Mod I upgraded HEP for RUE   Time 8   Period Weeks   Status Achieved     OT LONG TERM GOAL #2   Title Pt will be Mod I simple meal/snack prep in ADL kitchen while maintaining safety   Time 8   Period Weeks   Status Achieved     OT LONG TERM GOAL #3   Title Pt will demonstrate AROM RUE WNL's w/o c/o joint tightness at end range for shoulder flexion (as compared to LUE)   Time 8   Period Weeks   Status On-going     OT LONG TERM GOAL #4   Title Pt will demonstrate improved MMT RUE of 4/5 or greater    Baseline MMT grossly 3/5 at eval on 01/09/16; MMT RUE 4-/5 02/20/16   Time 8   Period Weeks   Status On-going     OT LONG TERM GOAL #5   Title Pt will demonstrate improved grip strength RUE to 60# or greater RUE   Baseline See Eval 01/09/16; 02/20/16 = 75#   Time 8   Period Weeks   Status Achieved     Long Term Additional Goals   Additional Long Term Goals Yes     OT LONG TERM GOAL #6   Title Pt will demonstrate improved 9 Hole Peg test score R UE to less than 1 min for improved coordination & functional hand use   Baseline 01/09/16 1 min 43 seconds; see 02/25/16 results in note   Time 8   Period Weeks   Status Achieved               Plan - 02/27/16 1134    Clinical Impression Statement Pt making excellent gains toward LTG's and pt/spouse wish to continue out-pt OT for continued strengthening and functional use of RUE. Will send re-cert with focus on remaining LTG's # 3 & 4 over next 4 weeks. Pt is in agreement with this. Pt has met LTG's #1, 2, 5 & 6.   Rehab Potential Good   OT Frequency 2x / week   OT Duration 4 weeks   OT Treatment/Interventions Self-care/ADL training;Fluidtherapy;DME and/or AE instruction;Splinting;Patient/family education;Balance training;Therapeutic exercises;Therapeutic exercise;Therapeutic activities;Cognitive remediation/compensation;Functional Mobility Training;Neuromuscular education;Manual Therapy;Visual/perceptual remediation/compensation   Plan Pt to schedule additional visits for out-pt OT w/ focus on remaining LTG's and RUE strengthening/functional use.   Consulted and Agree with Plan of Care Patient;Family member/caregiver   Family Member Consulted Spouse      Patient will benefit from skilled therapeutic intervention in order to improve the following deficits and impairments:  Abnormal gait, Decreased coordination, Decreased range of motion, Impaired flexibility, Impaired sensation, Decreased activity tolerance, Cardiopulmonary status  limiting activity, Decreased knowledge of precautions, Impaired UE functional use, Decreased knowledge of use of DME, Decreased balance, Decreased mobility, Decreased strength, Impaired vision/preception, Decreased cognition  Visit Diagnosis: Muscle weakness (generalized) - Plan: Ot plan of care cert/re-cert  Other lack of coordination - Plan: Ot plan of care cert/re-cert  Hemiplegia and hemiparesis following cerebral infarction affecting right dominant  side ([place]) - Plan: Ot plan of care cert/re-cert      G-Codes - 71/16/57 1137    Functional Assessment Tool Used Clinical Judgment; MMT and 9 hole peg test   Functional Limitation Carrying, moving and handling objects   Carrying, Moving and Handling Objects Current Status [phone]) At least 20 percent but less than 40 percent impaired, limited or restricted   Carrying, Moving and Handling Objects Goal Status ([id number]) At least 1 percent but less than 20 percent impaired, limited or restricted      Problem List Patient Active Problem List   Diagnosis Date Noted  . Acute CVA (cerebrovascular accident) ([place]) 01/05/2016  . Right sided weakness 01/05/2016  . Dyslipidemia associated with type 2 diabetes mellitus ([place]) 01/05/2016  . Hypokalemia 01/05/2016  . Type 2 diabetes mellitus with vascular disease ([place]) 10/21/2006    Christian Clements Ardath Sax, OTR/L 02/27/2016, 11:42 AM  [facility] [address] [place], [place], [address] Phone: [phone]   Fax:  [phone]  Name: Christian Clements MRN: [id number] Date of Birth: Jul 04, 1950

## 2016-02-27 NOTE — Therapy (Signed)
[facility] [address] [place], [place], [address] Phone: [phone]   Fax:  [phone]  Physical Therapy Treatment  Patient Details  Name: Christian Clements MRN: [id number] Date of Birth: [birth year] Referring Provider: Rosalin Hawking, MD  Encounter Date: 02/27/2016      PT End of Session - 02/27/16 0947    Visit Number 14   Number of Visits 17   Date for PT Re-Evaluation 03/10/16   Authorization Type [employer] Medicare   Authorization Time Period G-code every 10th visit   PT Start Time 0845   PT Stop Time 0926   PT Time Calculation (min) 41 min   Equipment Utilized During Treatment Gait belt   Activity Tolerance Patient tolerated treatment well   Behavior During Therapy [facility] for tasks assessed/performed      Past Medical History:  Diagnosis Date  . Diabetes mellitus   . Hypertension   . Prostatitis 2011    History reviewed. No pertinent surgical history.  There were no vitals filed for this visit.      Subjective Assessment - 02/27/16 0849    Subjective (P)  No falls or pain to report. Pt has been working out regularly, low weights, cardio, and core strengthening, along with swimming.   Patient is accompained by: (P)  Family member   Pertinent History (P)  Likes to be called "Christian Clements".  PMH significant: HTN and DM II,   Patient Stated Goals (P)  "I want to be able to move around and do things I did before I had this stroke. I like to golf and I work out 5-6 times per week."   Currently in Pain? (P)  No/denies   Pain Score (P)  0-No pain      High Level Balance  On blue foam balance beam tandem walking fwd/bkd; 5 laps; with VC for technique and visual feedback for balance; min guard to min assist; no UE support.  Tandem stance EC on balance beam; 3x 30 seconds significant sway noted and frequent LOB; min guard to min assist.  4 hurdles on compliant surface of varying height; fwd & lateral; 5 laps each; vc  for foot clearance and to correct circumduction; min guard.  Therapeutic Exercise  Heel raises on airx pad; bil with no UE support; Single LE heel raise on L with fingertip support for balance; Single LE heel raise on R with 1 UE support for physical assist; 2x 10 each; min guard  Quadruped with R hip extended; performing knee flexion and extension; Verbal and tactile cues to maintain hip ext and correct compensation as pt was rotating hips to increase extension; 10 reps x3  Tall kneeling to partial squat x15 reps; x10 reps; Demo for technique  Sit<>stand w/ L LE on 4 inch block; x15 reps; x10 reps; VC for posture       PT Short Term Goals - 02/06/16 0827      PT SHORT TERM GOAL #1   Title Pt will be independent and verbalize understanding of initial HEP to continue progress made in therapy. (Target Date for all STGs: 02/07/16)   Baseline Met 11/15.   Time 4   Period Weeks   Status Achieved     PT SHORT TERM GOAL #2   Title Pt will improve FGA to 18/30 to indicate improved functional gait and mobility.   Baseline 11/15: FGA = 22/30   Time 4   Period Weeks   Status Achieved     PT  SHORT TERM GOAL #3   Title Pt will improve 5 times sit to stand to < or = 12 sec indicating a decr risk of falls.   Baseline 11/15: 5x STS = 8.17 seconds   Time 4   Period Weeks   Status Achieved     PT SHORT TERM GOAL #4   Title Pt will negotiate 8 stairs with supervision and no UE support to incr safety negotiating stairs at home.   Baseline Met 11/15.   Time 4   Period Weeks   Status Achieved     PT SHORT TERM GOAL #5   Title Pt will ambulate 500' with mod I over level surfaces, ramps, and curbs to incr safey ambulating at home.   Baseline Met 11/15.   Time 4   Period Weeks   Status Achieved           PT Long Term Goals - 01/10/16 1705      PT LONG TERM GOAL #1   Title Pt will be independent and verbalize understanding of HEP and on-going fitness plan to maintain progress made  in therapy and his active lifestyle. (Target Date for all LTGs: 03/06/16)   Time 8   Period Weeks   Status New     PT LONG TERM GOAL #2   Title Pt will improve FGA score to > or = 23/30 to indicate decr fall risk and improved functional mobility.   Time 8   Period Weeks   Status New     PT LONG TERM GOAL #3   Title Pt will improve gait speed to > 2.62 ft/sec to indicate status of community ambulator.   Time 8   Period Weeks   Status New     PT LONG TERM GOAL #4   Title Pt will negotiate 15 stairs with mod I and no UE support to improve safety with stairs at home.   Time 8   Period Weeks   Status New     PT LONG TERM GOAL #5   Title Pt will ambulate 1061f outdoors over unlevel surfaces, pavement, gravel, grass, ramps, and curbs with mod I to incr safety while ambulating in the community.   Time 8   Period Weeks   Status New           Plan - 02/27/16 0948    Clinical Impression Statement Patient continues to show improvement in balance as he is performing higher level balance activities than previous treatment sessions; however he is still challenged by activities with limited visual impute. Pts R ankle continues to show weakness and instability compared to the L. He is progressing towards goals and should benefit from continued PT to achieve unmet goals.   Rehab Potential Excellent   Clinical Impairments Affecting Rehab Potential HTN, Type 2 DM   PT Frequency 2x / week   PT Duration 8 weeks   PT Treatment/Interventions ADLs/Self Care Home Management;Functional mobility training;Stair training;Gait training;Therapeutic activities;Therapeutic exercise;Balance training;Neuromuscular re-education;Patient/family education;Manual techniques;Energy conservation   PT Next Visit Plan Cont R plantarflexor and hamstring strengthening/knee control exercises; balance without visual imput   PT Home Exercise Plan bridging, water activites, sit<>stands, Tband-resisted R TKE, tandem gait    Consulted and Agree with Plan of Care Patient;Family member/caregiver   Family Member Consulted Wife      Patient will benefit from skilled therapeutic intervention in order to improve the following deficits and impairments:  Abnormal gait, Decreased activity tolerance, Decreased knowledge of precautions, Decreased endurance, Decreased  strength, Impaired perceived functional ability, Impaired sensation, Improper body mechanics, Impaired UE functional use, Decreased coordination  Visit Diagnosis: Hemiplegia and hemiparesis following cerebral infarction affecting right dominant side ([facility])  Muscle weakness (generalized)  Other abnormalities of gait and mobility     Problem List Patient Active Problem List   Diagnosis Date Noted  . Acute CVA (cerebrovascular accident) ([place]) 01/05/2016  . Right sided weakness 01/05/2016  . Dyslipidemia associated with type 2 diabetes mellitus ([place]) 01/05/2016  . Hypokalemia 01/05/2016  . Type 2 diabetes mellitus with vascular disease ([place]) 10/21/2006    Benjiman Core, [place] 02/27/2016, 9:54 AM  [facility] [address] [place], [place], [address] Phone: [phone]   Fax:  [phone]  Name: Christian Clements MRN: [id number] Date of Birth: Sep 30, 1950

## 2016-02-28 ENCOUNTER — Other Ambulatory Visit: Payer: Self-pay | Admitting: Nurse Practitioner

## 2016-02-28 ENCOUNTER — Ambulatory Visit (INDEPENDENT_AMBULATORY_CARE_PROVIDER_SITE_OTHER): Payer: Medicare Other

## 2016-02-28 DIAGNOSIS — I4891 Unspecified atrial fibrillation: Secondary | ICD-10-CM

## 2016-02-28 DIAGNOSIS — I639 Cerebral infarction, unspecified: Secondary | ICD-10-CM | POA: Diagnosis not present

## 2016-03-02 NOTE — Progress Notes (Signed)
Subjective:    Patient ID: Christian Clements, male    DOB: 1950/07/26, 65 y.o.   MRN: [id number]  HPI Pt returns for f/u of diabetes mellitus: DM type: Insulin-requiring type 2 Dx'ed: 8099 Complications: CVA Therapy: 2 oral meds.  DKA: never Severe hypoglycemia: never Pancreatitis: never Other: he took insulin for 1 month, in 2017 Interval history: he says cbg's vary from 98-177.  There is no trend throughout the day.  pt states she feels well in general.  Past Medical History:  Diagnosis Date  . Diabetes mellitus   . Hypertension   . Prostatitis 2011    No past surgical history on file.  Social History   Social History  . Marital status: Married    Spouse name: N/A  . Number of children: N/A  . Years of education: N/A   Occupational History  . PE teacher [employer]   Social History Main Topics  . Smoking status: Former Research scientist (life sciences)  . Smokeless tobacco: Never Used  . Alcohol use 1.8 oz/week    3 Glasses of wine per week  . Drug use: No  . Sexual activity: Yes   Other Topics Concern  . Not on file   Social History Narrative   Regular exercise- yes          Current Outpatient Prescriptions on File Prior to Visit  Medication Sig Dispense Refill  . aspirin 325 MG tablet Take 1 tablet (325 mg total) by mouth daily. 100 tablet 3  . atorvastatin (LIPITOR) 40 MG tablet Take 1 tablet (40 mg total) by mouth daily at 6 PM. 30 tablet 0  . blood glucose meter kit and supplies KIT Dispense based on patient and insurance preference. Use two times daily as directed. (FOR ICD-9 250.00, 250.01). 1 each 0  . Cholecalciferol 1000 UNITS tablet Take 1,000 Units by mouth daily.      Marland Kitchen linagliptin (TRADJENTA) 5 MG TABS tablet Take 1 tablet (5 mg total) by mouth daily. 30 tablet 11  . metFORMIN (GLUCOPHAGE) 1000 MG tablet Take 1 tablet (1,000 mg total) by mouth 2 (two) times daily with a meal. 180 tablet 3  . PROBIOTIC CAPS Take 1 capsule by mouth daily.      .  quinapril-hydrochlorothiazide (ACCURETIC) 20-12.5 MG tablet Take 2 tablets by mouth daily. 180 tablet 2  . sildenafil (VIAGRA) 100 MG tablet Take 1 tablet (100 mg total) by mouth daily as needed. 10 tablet 11  . triamcinolone ointment (KENALOG) 0.5 % Apply 1 application topically 2 (two) times daily. 30 g 1   No current facility-administered medications on file prior to visit.     No Known Allergies  Family History  Problem Relation Age of Onset  . Cancer Mother 9    leukemia  . Hypertension Other   . Diabetes Maternal Grandfather     BP 128/84   Pulse 69   Ht '5\' 10"'$  (1.778 m)   Wt 217 lb (98.4 kg)   SpO2 96%   BMI 31.14 kg/m    Review of Systems He denies hypoglycemia    Objective:   Physical Exam VITAL SIGNS:  See vs page GENERAL: no distress Pulses: dorsalis pedis intact bilat.   MSK: no deformity of the feet CV: no leg edema Skin:  no ulcer on the feet.  normal color and temp on the feet. Neuro: sensation is intact to touch on the feet Ext: There is bilateral onychomycosis of the toenails  Lab Results  Component Value Date  CREATININE 1.03 01/10/2016   BUN 15 01/10/2016   NA 137 01/10/2016   K 4.3 01/10/2016   CL 101 01/10/2016   CO2 26 01/10/2016        Assessment & Plan:  Type 2 DM, with CVA: uncertain control.  Patient is advised the following: Patient Instructions  check your blood sugar once a day.  vary the time of day when you check, between before the 3 meals, and at bedtime.  also check if you have symptoms of your blood sugar being too high or too low.  please keep a record of the readings and bring it to your next appointment here (or you can bring the meter itself).  You can write it on any piece of paper.  please call us sooner if your blood sugar goes below 70, or if you have a lot of readings over 200. blood tests are requested for you today.  We'll let you know about the results. If it is high, we can add "jardiance."  If we do, we'll  need to reduce the quinapril/HCTZ. Please come back for a follow-up appointment in 3 months.

## 2016-03-04 ENCOUNTER — Ambulatory Visit: Payer: Medicare Other | Admitting: *Deleted

## 2016-03-04 ENCOUNTER — Encounter: Payer: Self-pay | Admitting: *Deleted

## 2016-03-04 ENCOUNTER — Encounter: Payer: Self-pay | Admitting: Physical Therapy

## 2016-03-04 ENCOUNTER — Ambulatory Visit (INDEPENDENT_AMBULATORY_CARE_PROVIDER_SITE_OTHER): Payer: Medicare Other | Admitting: Endocrinology

## 2016-03-04 ENCOUNTER — Ambulatory Visit: Payer: Medicare Other | Admitting: Physical Therapy

## 2016-03-04 ENCOUNTER — Encounter: Payer: Self-pay | Admitting: Endocrinology

## 2016-03-04 VITALS — BP 128/84 | HR 69 | Ht 70.0 in | Wt 217.0 lb

## 2016-03-04 DIAGNOSIS — I69351 Hemiplegia and hemiparesis following cerebral infarction affecting right dominant side: Secondary | ICD-10-CM | POA: Diagnosis not present

## 2016-03-04 DIAGNOSIS — R278 Other lack of coordination: Secondary | ICD-10-CM

## 2016-03-04 DIAGNOSIS — M6281 Muscle weakness (generalized): Secondary | ICD-10-CM

## 2016-03-04 DIAGNOSIS — R2681 Unsteadiness on feet: Secondary | ICD-10-CM

## 2016-03-04 DIAGNOSIS — E1159 Type 2 diabetes mellitus with other circulatory complications: Secondary | ICD-10-CM

## 2016-03-04 DIAGNOSIS — R2689 Other abnormalities of gait and mobility: Secondary | ICD-10-CM

## 2016-03-04 NOTE — Therapy (Signed)
[facility] [address] [place], [place], [address] Phone: [phone]   Fax:  [phone]  Occupational Therapy Treatment  Patient Details  Name: Christian Clements MRN: [id number] Date of Birth: [birth year] Referring Provider: Dr Rosalin Hawking  Encounter Date: 03/04/2016      OT End of Session - 03/04/16 1148    Visit Number 15   Number of Visits 24   Date for OT Re-Evaluation 03/26/16   Authorization Type [employer] [place] Medicare Every 10 visits   Authorization - Visit Number 5   Authorization - Number of Visits 10   OT Start Time 1100   OT Stop Time 1145   OT Time Calculation (min) 45 min   Activity Tolerance Patient tolerated treatment well   Behavior During Therapy [facility] for tasks assessed/performed      Past Medical History:  Diagnosis Date  . Diabetes mellitus   . Hypertension   . Prostatitis 2011    History reviewed. No pertinent surgical history.  There were no vitals filed for this visit.      Subjective Assessment - 03/04/16 1106    Subjective  "I've been stretching a lot" RUE, denies pain and reports improved/decreased stiffness.   Patient is accompained by: Family member   Pertinent History see Epic   Currently in Pain? No/denies   Pain Score 0-No pain                      OT Treatments/Exercises (OP) - 03/04/16 1110      Exercises   Exercises Neurological Re-education     Fine Motor Coordination   Other Fine Motor Exercises Green putty for grip, pinches right hand x10 reps each   Other Fine Motor Exercises Small pegs in green putty using right hand to remove one at a time.      Neurological Re-education Exercises   Other Exercises 1 Standing at wall for shoulder flexion and ABD ex's hold x5 count 2 sets of 10 each for shoulder A/AROM and stretch   Other Weight-Bearing Exercises 1 Quadraped: A-P wt shifts, followed by cat/cow stretch for scapula stability.  Modified push ups in quadraped x2 sets 10 with rest break between sets and vc's for positioning. Quadraped on mat while raising opposite arm/leg and holding for 3 count x 10 reps ea side. Modified push ups 2 sets of 10 ea w/ rest break between sets   Reciprocal Movements UBE level 6 x10 min w/ focus on mainting RPMs and reciprocal arm movements and strengthening.             Balance Exercises - 03/04/16 1048      Balance Exercises: Standing   Standing Eyes Opened Wide (BOA);Head turns;Foam/compliant surface;Other reps (comment);Limitations   Standing Eyes Closed Wide (BOA);Head turns;Foam/compliant surface;Other reps (comment);Limitations     Balance Exercises: Standing   Standing Eyes Opened Limitations on inverted BOSU   Standing Eyes Closed Limitations on inverted BOSU           OT Education - 03/04/16 1148    Education provided Yes   Education Details Home program, updated coordination ex's and stretch RUE   Person(s) Educated Patient;Spouse   Methods Explanation;Demonstration   Comprehension Returned demonstration          OT Short Term Goals - 02/07/16 1031      OT SHORT TERM GOAL #1   Title Pt will be Mod I HEP for AROM and coordination RUE   Time 4  Period Weeks   Status Achieved     OT SHORT TERM GOAL #2   Title Pt will demonstrate improved grip strength by 5# or more RUE as seen by JAMAR geip assessment   Baseline RUE 55# at Eval; 79# 02/07/16   Time 4   Period Weeks   Status Achieved     OT SHORT TERM GOAL #3   Title Pt will be Mod I stating 3 possible signs/symptoms/risk factors of CVA   Time 4   Period Weeks   Status Achieved           OT Long Term Goals - 02/27/16 1133      OT LONG TERM GOAL #1   Title Pt will be Mod I upgraded HEP for RUE   Time 8   Period Weeks   Status Achieved     OT LONG TERM GOAL #2   Title Pt will be Mod I simple meal/snack prep in ADL kitchen while maintaining safety   Time 8   Period Weeks   Status  Achieved     OT LONG TERM GOAL #3   Title Pt will demonstrate AROM RUE WNL's w/o c/o joint tightness at end range for shoulder flexion (as compared to LUE)   Time 8   Period Weeks   Status On-going     OT LONG TERM GOAL #4   Title Pt will demonstrate improved MMT RUE of 4/5 or greater   Baseline MMT grossly 3/5 at eval on 01/09/16; MMT RUE 4-/5 02/20/16   Time 8   Period Weeks   Status On-going     OT LONG TERM GOAL #5   Title Pt will demonstrate improved grip strength RUE to 60# or greater RUE   Baseline See Eval 01/09/16; 02/20/16 = 75#   Time 8   Period Weeks   Status Achieved     Long Term Additional Goals   Additional Long Term Goals Yes     OT LONG TERM GOAL #6   Title Pt will demonstrate improved 9 Hole Peg test score R UE to less than 1 min for improved coordination & functional hand use   Baseline 01/09/16 1 min 43 seconds; see 02/25/16 results in note   Time 8   Period Weeks   Status Achieved               Plan - 03/04/16 1149    Clinical Impression Statement Pt demonstrating decreased "tightness" RUE and overall improved A/ROM for functional activity.   Rehab Potential Good   OT Frequency 2x / week   OT Duration 4 weeks   OT Treatment/Interventions Self-care/ADL training;Fluidtherapy;DME and/or AE instruction;Splinting;Patient/family education;Balance training;Therapeutic exercises;Therapeutic exercise;Therapeutic activities;Cognitive remediation/compensation;Functional Mobility Training;Neuromuscular education;Manual Therapy;Visual/perceptual remediation/compensation   Plan R UE strengthening and funcitonal use.    Consulted and Agree with Plan of Care Patient;Family member/caregiver   Family Member Consulted Spouse      Patient will benefit from skilled therapeutic intervention in order to improve the following deficits and impairments:  Abnormal gait, Decreased coordination, Decreased range of motion, Impaired flexibility, Impaired sensation,  Decreased activity tolerance, Cardiopulmonary status limiting activity, Decreased knowledge of precautions, Impaired UE functional use, Decreased knowledge of use of DME, Decreased balance, Decreased mobility, Decreased strength, Impaired vision/preception, Decreased cognition  Visit Diagnosis: Muscle weakness (generalized)  Other lack of coordination  Hemiplegia and hemiparesis following cerebral infarction affecting right dominant side [facility])    Problem List Patient Active Problem List   Diagnosis Date Noted  .  Acute CVA (cerebrovascular accident) ([place]) 01/05/2016  . Right sided weakness 01/05/2016  . Dyslipidemia associated with type 2 diabetes mellitus ([place]) 01/05/2016  . Hypokalemia 01/05/2016  . Type 2 diabetes mellitus with vascular disease ([place]) 10/21/2006    Barnhill, Amy Ardath Sax, OTR/L 03/04/2016, 11:52 AM  [facility] [address] [place], [place], [address] Phone: [phone]   Fax:  [phone]  Name: Christian Clements MRN: [id number] Date of Birth: 03/21/1951

## 2016-03-04 NOTE — Patient Instructions (Addendum)
check your blood sugar once a day.  vary the time of day when you check, between before the 3 meals, and at bedtime.  also check if you have symptoms of your blood sugar being too high or too low.  please keep a record of the readings and bring it to your next appointment here (or you can bring the meter itself).  You can write it on any piece of paper.  please call us sooner if your blood sugar goes below 70, or if you have a lot of readings over 200. blood tests are requested for you today.  We'll let you know about the results. If it is high, we can add "jardiance."  If we do, we'll need to reduce the quinapril/HCTZ. Please come back for a follow-up appointment in 3 months.

## 2016-03-04 NOTE — Therapy (Signed)
[facility] [address] [place], [place], [address] Phone: [phone]   Fax:  [phone]  Physical Therapy Treatment  Patient Details  Name: Christian Clements MRN: [id number] Date of Birth: Jul 11, 1950 Referring Provider: Rosalin Hawking, MD  Encounter Date: 03/04/2016      PT End of Session - 03/04/16 1020    Visit Number 15   Number of Visits 17   Date for PT Re-Evaluation 03/10/16   Authorization Type [employer] Medicare   Authorization Time Period G-code every 10th visit   PT Start Time 1015   PT Stop Time 1100   PT Time Calculation (min) 45 min   Equipment Utilized During Treatment Gait belt   Activity Tolerance Patient tolerated treatment well   Behavior During Therapy WFL for tasks assessed/performed      Past Medical History:  Diagnosis Date  . Diabetes mellitus   . Hypertension   . Prostatitis 2011    History reviewed. No pertinent surgical history.  There were no vitals filed for this visit.      Subjective Assessment - 03/04/16 1020    Subjective No new complaints. No falls or pain to report.    Patient is accompained by: Family member   Pertinent History Likes to be called "Christian Clements".  PMH significant: HTN and DM II,   Patient Stated Goals "I want to be able to move around and do things I did before I had this stroke. I like to golf and I work out 5-6 times per week."   Currently in Pain? No/denies   Pain Score 0-No pain            [facility] PT Assessment - 03/04/16 1022      Functional Gait  Assessment   Gait assessed  Yes   Gait Level Surface Walks 20 ft in less than 5.5 sec, no assistive devices, good speed, no evidence for imbalance, normal gait pattern, deviates no more than 6 in outside of the 12 in walkway width.   Change in Gait Speed Able to smoothly change walking speed without loss of balance or gait deviation. Deviate no more than 6 in outside of the 12 in walkway width.   Gait with  Horizontal Head Turns Performs head turns smoothly with no change in gait. Deviates no more than 6 in outside 12 in walkway width   Gait with Vertical Head Turns Performs head turns with no change in gait. Deviates no more than 6 in outside 12 in walkway width.   Gait and Pivot Turn Pivot turns safely within 3 sec and stops quickly with no loss of balance.   Step Over Obstacle Is able to step over one shoe box (4.5 in total height) without changing gait speed. No evidence of imbalance.  imbalance noted with higher height   Gait with Narrow Base of Support Is able to ambulate for 10 steps heel to toe with no staggering.   Gait with Eyes Closed Walks 20 ft, slow speed, abnormal gait pattern, evidence for imbalance, deviates 10-15 in outside 12 in walkway width. Requires more than 9 sec to ambulate 20 ft.   Ambulating Backwards Walks 20 ft, uses assistive device, slower speed, mild gait deviations, deviates 6-10 in outside 12 in walkway width.  slower speed today   Steps Alternating feet, no rail.   Total Score 26   FGA comment: 25-28= low fall risk           [facility] Adult PT Treatment/Exercise - 03/04/16  1022      Transfers   Transfers Sit to Stand;Stand to Sit   Sit to Stand 7: Independent   Stand to Sit 7: Independent     Ambulation/Gait   Ambulation/Gait Yes   Ambulation/Gait Assistance 5: Supervision;6: Modified independent (Device/Increase time)   Ambulation/Gait Assistance Details occasional toe scuffing noted, decreased stance on right and occasional knee instability noted   Ambulation Distance (Feet) 1000 Feet   Assistive device None   Gait Pattern Step-through pattern;Decreased stride length;Decreased stance time - right;Narrow base of support;Poor foot clearance - right   Ambulation Surface Level;Unlevel;Indoor;Outdoor   Gait velocity 9.50 sec= 3.45 ft/sec no AD    Stairs Yes   Stairs Assistance 6: Modified independent (Device/Increase time);5: Supervision   Stairs Assistance  Details (indicate cue type and reason) supervision due to right knee instability, occasional hyperextension with ascending stairs, increased time needed with descending stairs   Stair Management Technique No rails;Alternating pattern;Forwards   Number of Stairs 16  4 steps    Height of Stairs 6             Balance Exercises - 03/04/16 1048      Balance Exercises: Standing   Standing Eyes Opened Wide (BOA);Head turns;Foam/compliant surface;Other reps (comment);Limitations   Standing Eyes Closed Wide (BOA);Head turns;Foam/compliant surface;Other reps (comment);Limitations     Balance Exercises: Standing   Standing Eyes Opened Limitations on inverted BOSU: EO rocking fwd/bwd and laterally; EO head movements up<>down and left<>right with light fingertip touch on parallel bars, min to mod assist for balance.   Standing Eyes Closed Limitations on inverted BOSU: EC no head movements, EC head movements up<>down and left<>right with UE touch to parallel bars and min/mod assist for balance.             PT Short Term Goals - 02/06/16 0827      PT SHORT TERM GOAL #1   Title Pt will be independent and verbalize understanding of initial HEP to continue progress made in therapy. (Target Date for all STGs: 02/07/16)   Baseline Met 11/15.   Time 4   Period Weeks   Status Achieved     PT SHORT TERM GOAL #2   Title Pt will improve FGA to 18/30 to indicate improved functional gait and mobility.   Baseline 11/15: FGA = 22/30   Time 4   Period Weeks   Status Achieved     PT SHORT TERM GOAL #3   Title Pt will improve 5 times sit to stand to < or = 12 sec indicating a decr risk of falls.   Baseline 11/15: 5x STS = 8.17 seconds   Time 4   Period Weeks   Status Achieved     PT SHORT TERM GOAL #4   Title Pt will negotiate 8 stairs with supervision and no UE support to incr safety negotiating stairs at home.   Baseline Met 11/15.   Time 4   Period Weeks   Status Achieved     PT SHORT  TERM GOAL #5   Title Pt will ambulate 500' with mod I over level surfaces, ramps, and curbs to incr safey ambulating at home.   Baseline Met 11/15.   Time 4   Period Weeks   Status Achieved           PT Long Term Goals - 03/04/16 1021      PT LONG TERM GOAL #1   Title Pt will be independent and verbalize understanding of HEP and  on-going fitness plan to maintain progress made in therapy and his active lifestyle. (Target Date for all LTGs: 03/06/16)   Baseline 03/04/16: independent with current HEP, needs ongoing updating as strength/balance improve   Time --   Period --   Status On-going     PT LONG TERM GOAL #2   Title Pt will improve FGA score to > or = 23/30 to indicate decr fall risk and improved functional mobility.   Baseline 03/04/16: 26/30 scored today   Time --   Period --   Status Achieved     PT LONG TERM GOAL #3   Title Pt will improve gait speed to > 2.62 ft/sec to indicate status of community ambulator.   Baseline 03/04/16: 3.45 ft/sec today with no AD   Time --   Period --   Status Achieved     PT LONG TERM GOAL #4   Title Pt will negotiate 15 stairs with mod I and no UE support to improve safety with stairs at home.   Baseline 03/04/16: negotiated 16 stairs reciprocally with supervision due to knee instability/occasional recurvatum   Time --   Period --   Status Partially Met     PT LONG TERM GOAL #5   Title Pt will ambulate 1045f outdoors over unlevel surfaces, pavement, gravel, grass, ramps, and curbs with mod I to incr safety while ambulating in the community.   Baseline 12/012/17: 1000 feet on uneven paved surfaces. was not able to address grass/gravel due to recent snow (still wet/muddy), [place] to be [place] to occasional knee recurvatum   Time --   Period --   Status Partially Met           Plan - 03/04/16 1020    Clinical Impression Statement Pt has met 2 LTGs to date and partailly met the remainder LTGs. Remainder of  session continued to address balance deficits. Pt should benefit from continued PT to progress toward unmet goals .   Rehab Potential Excellent   Clinical Impairments Affecting Rehab Potential HTN, Type 2 DM   PT Frequency 2x / week   PT Duration 8 weeks   PT Treatment/Interventions ADLs/Self Care Home Management;Functional mobility training;Stair training;Gait training;Therapeutic activities;Therapeutic exercise;Balance training;Neuromuscular re-education;Patient/family education;Manual techniques;Energy conservation   PT Next Visit Plan Cont R plantarflexor and hamstring strengthening/knee control exercises; balance without visual imput   PT Home Exercise Plan bridging, water activites, sit<>stands, Tband-resisted R TKE, tandem gait   Consulted and Agree with Plan of Care Patient;Family member/caregiver   Family Member Consulted Wife      Patient will benefit from skilled therapeutic intervention in order to improve the following deficits and impairments:  Abnormal gait, Decreased activity tolerance, Decreased knowledge of precautions, Decreased endurance, Decreased strength, Impaired perceived functional ability, Impaired sensation, Improper body mechanics, Impaired UE functional use, Decreased coordination  Visit Diagnosis: Hemiplegia and hemiparesis following cerebral infarction affecting right dominant side ([facility])  Muscle weakness (generalized)  Other abnormalities of gait and mobility  Unsteadiness on feet  Other lack of coordination     Problem List Patient Active Problem List   Diagnosis Date Noted  . Acute CVA (cerebrovascular accident) ([place] 01/05/2016  . Right sided weakness 01/05/2016  . Dyslipidemia associated with type 2 diabetes mellitus ([place] 01/05/2016  . Hypokalemia 01/05/2016  . Type 2 diabetes mellitus with vascular disease ([place] 10/21/2006    KWillow Ora PTA, CAlderson[address] [place]  [phone]/12/17, 4:19 PM  Name: Christian Clements MRN: [id number] Date of Birth: 02-09-1951

## 2016-03-06 ENCOUNTER — Encounter: Payer: Self-pay | Admitting: Nurse Practitioner

## 2016-03-06 ENCOUNTER — Ambulatory Visit: Payer: Medicare Other | Admitting: Physical Therapy

## 2016-03-06 ENCOUNTER — Ambulatory Visit: Payer: Medicare Other | Admitting: Occupational Therapy

## 2016-03-06 DIAGNOSIS — M6281 Muscle weakness (generalized): Secondary | ICD-10-CM

## 2016-03-06 DIAGNOSIS — I69351 Hemiplegia and hemiparesis following cerebral infarction affecting right dominant side: Secondary | ICD-10-CM | POA: Diagnosis not present

## 2016-03-06 DIAGNOSIS — R2681 Unsteadiness on feet: Secondary | ICD-10-CM

## 2016-03-06 DIAGNOSIS — R2689 Other abnormalities of gait and mobility: Secondary | ICD-10-CM

## 2016-03-06 DIAGNOSIS — R278 Other lack of coordination: Secondary | ICD-10-CM

## 2016-03-06 LAB — FRUCTOSAMINE: Fructosamine: 258 umol/L (ref 190–270)

## 2016-03-06 NOTE — Therapy (Signed)
[facility] [address] [place], [place], [address] Phone: [phone]   Fax:  [phone]  Occupational Therapy Treatment  Patient Details  Name: Christian Clements MRN: [id number] Date of Birth: 20-Jan-1951 Referring Provider: Dr Rosalin Hawking  Encounter Date: 03/06/2016      OT End of Session - 03/06/16 1155    Visit Number 16   Number of Visits 24   Date for OT Re-Evaluation 03/26/16   Authorization Type [employer] [place] Medicare Every 10 visits   Authorization - Visit Number 6   Authorization - Number of Visits 10   OT Start Time 0933   OT Stop Time 1015   OT Time Calculation (min) 42 min      Past Medical History:  Diagnosis Date  . Diabetes mellitus   . Hypertension   . Prostatitis 2011    No past surgical history on file.  There were no vitals filed for this visit.      Subjective Assessment - 03/06/16 0944    Pertinent History see Epic   Currently in Pain? No/denies          Treatment:  Neurological Re-education Exercises   Other Exercises 1 Standing at wall for shoulder flexion and ABD ex's hold x5 count 2 sets of 10 each for shoulder A/AROM and stretch   Other Weight-Bearing Exercises 1 Quadraped: A-P wt shifts, followed by cat/cow stretch for scapula stability.  Quadraped on mat while raising opposite arm/leg and holding for 3 count x 10 reps ea side.    Reciprocal Movements UBE level 6 x8 min w/ focus on mainting RPMs and reciprocal arm movements and strengthening.      Standing on non compliant surface to copy small peg design with RUE, with 1 lbs weight on right wrist, min difficulty, mod drops of pegs, close supervision for balance.                    OT Short Term Goals - 02/07/16 1031      OT SHORT TERM GOAL #1   Title Pt will be Mod I HEP for AROM and coordination RUE   Time 4   Period Weeks   Status Achieved     OT SHORT TERM GOAL #2   Title  Pt will demonstrate improved grip strength by 5# or more RUE as seen by JAMAR geip assessment   Baseline RUE 55# at Eval; 79# 02/07/16   Time 4   Period Weeks   Status Achieved     OT SHORT TERM GOAL #3   Title Pt will be Mod I stating 3 possible signs/symptoms/risk factors of CVA   Time 4   Period Weeks   Status Achieved           OT Long Term Goals - 02/27/16 1133      OT LONG TERM GOAL #1   Title Pt will be Mod I upgraded HEP for RUE   Time 8   Period Weeks   Status Achieved     OT LONG TERM GOAL #2   Title Pt will be Mod I simple meal/snack prep in ADL kitchen while maintaining safety   Time 8   Period Weeks   Status Achieved     OT LONG TERM GOAL #3   Title Pt will demonstrate AROM RUE WNL's w/o c/o joint tightness at end range for shoulder flexion (as compared to LUE)   Time 8   Period Weeks  Status On-going     OT LONG TERM GOAL #4   Title Pt will demonstrate improved MMT RUE of 4/5 or greater   Baseline MMT grossly 3/5 at eval on 01/09/16; MMT RUE 4-/5 02/20/16   Time 8   Period Weeks   Status On-going     OT LONG TERM GOAL #5   Title Pt will demonstrate improved grip strength RUE to 60# or greater RUE   Baseline See Eval 01/09/16; 02/20/16 = 75#   Time 8   Period Weeks   Status Achieved     Long Term Additional Goals   Additional Long Term Goals Yes     OT LONG TERM GOAL #6   Title Pt will demonstrate improved 9 Hole Peg test score R UE to less than 1 min for improved coordination & functional hand use   Baseline 01/09/16 1 min 43 seconds; see 02/25/16 results in note   Time 8   Period Weeks   Status Achieved               Plan - 03/06/16 1203    Clinical Impression Statement Pt is progressing towards goals with improved strength and RUE functional use.   Rehab Potential Good   OT Frequency 2x / week   OT Duration 4 weeks   OT Treatment/Interventions Self-care/ADL training;Fluidtherapy;DME and/or AE  instruction;Splinting;Patient/family education;Balance training;Therapeutic exercises;Therapeutic exercise;Therapeutic activities;Cognitive remediation/compensation;Functional Mobility Training;Neuromuscular education;Manual Therapy;Visual/perceptual remediation/compensation   Plan continue [place]/ functional use   Consulted and Agree with Plan of Care Patient      Patient will benefit from skilled therapeutic intervention in order to improve the following deficits and impairments:  Abnormal gait, Decreased coordination, Decreased range of motion, Impaired flexibility, Impaired sensation, Decreased activity tolerance, Cardiopulmonary status limiting activity, Decreased knowledge of precautions, Impaired UE functional use, Decreased knowledge of use of DME, Decreased balance, Decreased mobility, Decreased strength, Impaired vision/preception, Decreased cognition  Visit Diagnosis: Muscle weakness (generalized)  Other lack of coordination  Hemiplegia and hemiparesis following cerebral infarction affecting right dominant side [facility])    Problem List Patient Active Problem List   Diagnosis Date Noted  . Acute CVA (cerebrovascular accident) ([place]) 01/05/2016  . Right sided weakness 01/05/2016  . Dyslipidemia associated with type 2 diabetes mellitus ([place]) 01/05/2016  . Hypokalemia 01/05/2016  . Type 2 diabetes mellitus with vascular disease ([place]) 10/21/2006    Christian Clements 03/06/2016, 12:04 PM  [facility] [address] [place], [place], [address] Phone: [phone]   Fax:  [phone]  Name: Christian Clements MRN: [id number] Date of Birth: [birth year]

## 2016-03-07 ENCOUNTER — Other Ambulatory Visit: Payer: Self-pay | Admitting: Internal Medicine

## 2016-03-11 ENCOUNTER — Ambulatory Visit: Payer: Medicare Other | Admitting: *Deleted

## 2016-03-11 ENCOUNTER — Other Ambulatory Visit: Payer: Self-pay | Admitting: Internal Medicine

## 2016-03-11 ENCOUNTER — Encounter: Payer: Self-pay | Admitting: *Deleted

## 2016-03-11 ENCOUNTER — Ambulatory Visit: Payer: Medicare Other | Admitting: Physical Therapy

## 2016-03-11 DIAGNOSIS — I69351 Hemiplegia and hemiparesis following cerebral infarction affecting right dominant side: Secondary | ICD-10-CM

## 2016-03-11 DIAGNOSIS — R278 Other lack of coordination: Secondary | ICD-10-CM

## 2016-03-11 DIAGNOSIS — M6281 Muscle weakness (generalized): Secondary | ICD-10-CM

## 2016-03-11 DIAGNOSIS — R2689 Other abnormalities of gait and mobility: Secondary | ICD-10-CM

## 2016-03-11 NOTE — Therapy (Signed)
[facility] [address] [place], [place], [address] Phone: [phone]   Fax:  [phone]  Occupational Therapy Treatment  Patient Details  Name: Christian Clements MRN: [id number] Date of Birth: 02-19-1951 Referring Provider: Dr Rosalin Hawking  Encounter Date: 03/11/2016      OT End of Session - 03/11/16 1301    Visit Number 17   Number of Visits 24   Date for OT Re-Evaluation 03/26/16   Authorization Type [employer] Medicare Every 10 visits   Authorization - Visit Number 7   Authorization - Number of Visits 10   OT Start Time 0849   OT Stop Time 0927   OT Time Calculation (min) 38 min   Activity Tolerance Patient tolerated treatment well   Behavior During Therapy [facility] for tasks assessed/performed      Past Medical History:  Diagnosis Date  . Diabetes mellitus   . Hypertension   . Prostatitis 2011    History reviewed. No pertinent surgical history.  There were no vitals filed for this visit.      Subjective Assessment - 03/11/16 0855    Subjective  Pt denies pain, reports that he feels as though he is getting stronger.    Pertinent History see Epic   Currently in Pain? No/denies   Pain Score 0-No pain   Multiple Pain Sites No                      OT Treatments/Exercises (OP) - 03/11/16 0001      Exercises   Exercises Neurological Re-education     Neurological Re-education Exercises   Other Exercises 1 Standing at wall for shoulder flexion and ABD ex's hold x5 count 2 sets of 10 each for shoulder A/AROM and stretch   Other Exercises 2 Red theraband ex's bilateral UE's for horizontal ABD, shoulder flexion and extension.;  Therapeutic Activity: standing on foam while reaching into cabinets to remove objects from various shelves while challenging balance.    Other Weight-Bearing Exercises 1 Quadraped: A-P wt shifts, followed by cat/cow stretch for scapula stability. Pt  required min cues for elbow extension. Modified push ups in quadraped x2 sets 10 with rest break between sets and vc's for positioning. Quadraped on mat while raising opposite arm/leg and holding for 3 count x 10 reps ea side. Modified push ups 2 sets of 10 ea w/ rest break between sets   Reciprocal Movements UBE level 6.5 x10 min w/ focus on mainting RPMs and reciprocal arm movements and strengthening.                OT Education - 03/11/16 1300    Education provided Yes   Education Details Reviewed HEP and strengthening RUE   Person(s) Educated Patient   Methods Explanation   Comprehension Verbalized understanding;Returned demonstration          OT Short Term Goals - 02/07/16 1031      OT SHORT TERM GOAL #1   Title Pt will be Mod I HEP for AROM and coordination RUE   Time 4   Period Weeks   Status Achieved     OT SHORT TERM GOAL #2   Title Pt will demonstrate improved grip strength by 5# or more RUE as seen by JAMAR geip assessment   Baseline RUE 55# at Eval; 79# 02/07/16   Time 4   Period Weeks   Status Achieved     OT SHORT TERM GOAL #3  Title Pt will be Mod I stating 3 possible signs/symptoms/risk factors of CVA   Time 4   Period Weeks   Status Achieved           OT Long Term Goals - 02/27/16 1133      OT LONG TERM GOAL #1   Title Pt will be Mod I upgraded HEP for RUE   Time 8   Period Weeks   Status Achieved     OT LONG TERM GOAL #2   Title Pt will be Mod I simple meal/snack prep in ADL kitchen while maintaining safety   Time 8   Period Weeks   Status Achieved     OT LONG TERM GOAL #3   Title Pt will demonstrate AROM RUE WNL's w/o c/o joint tightness at end range for shoulder flexion (as compared to LUE)   Time 8   Period Weeks   Status On-going     OT LONG TERM GOAL #4   Title Pt will demonstrate improved MMT RUE of 4/5 or greater   Baseline MMT grossly 3/5 at eval on 01/09/16; MMT RUE 4-/5 02/20/16   Time 8   Period Weeks   Status  On-going     OT LONG TERM GOAL #5   Title Pt will demonstrate improved grip strength RUE to 60# or greater RUE   Baseline See Eval 01/09/16; 02/20/16 = 75#   Time 8   Period Weeks   Status Achieved     Long Term Additional Goals   Additional Long Term Goals Yes     OT LONG TERM GOAL #6   Title Pt will demonstrate improved 9 Hole Peg test score R UE to less than 1 min for improved coordination & functional hand use   Baseline 01/09/16 1 min 43 seconds; see 02/25/16 results in note   Time 8   Period Weeks   Status Achieved               Plan - 03/11/16 1301    Clinical Impression Statement Pt making nice gains in overall strength and functional use of RUE, progressing toward goals.   Rehab Potential Good   OT Frequency 2x / week   OT Duration 4 weeks   OT Treatment/Interventions Self-care/ADL training;Fluidtherapy;DME and/or AE instruction;Splinting;Patient/family education;Balance training;Therapeutic exercises;Therapeutic exercise;Therapeutic activities;Cognitive remediation/compensation;Functional Mobility Training;Neuromuscular education;Manual Therapy;Visual/perceptual remediation/compensation   Plan Continue strengthening and functional use RUE. Functional activities while challenging balance.   Consulted and Agree with Plan of Care Patient      Patient will benefit from skilled therapeutic intervention in order to improve the following deficits and impairments:  Abnormal gait, Decreased coordination, Decreased range of motion, Impaired flexibility, Impaired sensation, Decreased activity tolerance, Cardiopulmonary status limiting activity, Decreased knowledge of precautions, Impaired UE functional use, Decreased knowledge of use of DME, Decreased balance, Decreased mobility, Decreased strength, Impaired vision/preception, Decreased cognition  Visit Diagnosis: Muscle weakness (generalized)  Hemiplegia and hemiparesis following cerebral infarction affecting right dominant  side (HCC)  Other lack of coordination  Other abnormalities of gait and mobility    Problem List Patient Active Problem List   Diagnosis Date Noted  . Acute CVA (cerebrovascular accident) ([place]) 01/05/2016  . Right sided weakness 01/05/2016  . Dyslipidemia associated with type 2 diabetes mellitus ([place]) 01/05/2016  . Hypokalemia 01/05/2016  . Type 2 diabetes mellitus with vascular disease ([place]) 10/21/2006    Suprena Travaglini Ardath Sax, OTR/L 03/11/2016, 1:06 PM  [facility] [address]  [place], [place], [address] Phone: [phone]   Fax:  [phone]  Name: Christian Clements MRN: [id number] Date of Birth: 1950-12-11

## 2016-03-22 ENCOUNTER — Other Ambulatory Visit: Payer: Self-pay | Admitting: Internal Medicine

## 2016-03-25 ENCOUNTER — Ambulatory Visit: Payer: Medicare Other | Attending: Neurology | Admitting: Occupational Therapy

## 2016-03-25 ENCOUNTER — Ambulatory Visit: Payer: Medicare Other | Admitting: Physical Therapy

## 2016-03-25 DIAGNOSIS — I69318 Other symptoms and signs involving cognitive functions following cerebral infarction: Secondary | ICD-10-CM | POA: Diagnosis present

## 2016-03-25 DIAGNOSIS — R2681 Unsteadiness on feet: Secondary | ICD-10-CM | POA: Insufficient documentation

## 2016-03-25 DIAGNOSIS — I69351 Hemiplegia and hemiparesis following cerebral infarction affecting right dominant side: Secondary | ICD-10-CM | POA: Diagnosis not present

## 2016-03-25 DIAGNOSIS — R2689 Other abnormalities of gait and mobility: Secondary | ICD-10-CM | POA: Diagnosis present

## 2016-03-25 DIAGNOSIS — M6281 Muscle weakness (generalized): Secondary | ICD-10-CM

## 2016-03-25 DIAGNOSIS — R278 Other lack of coordination: Secondary | ICD-10-CM | POA: Insufficient documentation

## 2016-03-25 NOTE — Therapy (Signed)
[facility] [address] [place], [place], [address] Phone: [phone]   Fax:  [phone]  Occupational Therapy Treatment  Patient Details  Name: Christian Clements MRN: [id number] Date of Birth: September 22, 1950 Referring Provider: Dr Rosalin Hawking  Encounter Date: 03/25/2016      OT End of Session - 03/25/16 1012    Visit Number 18   Number of Visits 24   Date for OT Re-Evaluation 03/26/16   Authorization Type [employer] [facility] [place] Medicare Every 10 visits   Authorization - Visit Number 8   Authorization - Number of Visits 10   OT Start Time 0935   OT Stop Time 1015   OT Time Calculation (min) 40 min   Activity Tolerance Patient tolerated treatment well      Past Medical History:  Diagnosis Date  . Diabetes mellitus   . Hypertension   . Prostatitis 2011    No past surgical history on file.  There were no vitals filed for this visit.      Subjective Assessment - 03/25/16 0940    Subjective  I've been going to the gym   Pertinent History see Epic   Currently in Pain? No/denies                      OT Treatments/Exercises (OP) - 03/25/16 0001      Exercises   Exercises --  UBE x 5 min. Level 10 for UE strength/endurance     Fine Motor Coordination   Fine Motor Coordination Small Pegboard   Small Pegboard Placing small pegs in pegboard on vertical surface with 1 lb. wrist weight on RUE for mid to high level reaching, strength/endurance, and coordination RUE while copying peg design     Neurological Re-education Exercises   Scapular Stabilization Quadraped;Seated;Standing   Shoulder Flexion AAROM  high level rolling ball along wall    Seated with weight on hand Seated: wt bearing through BUE's while bridging; progressed to LE marching while wt bearing through UE's in sh. extension, ER, and scapula retraction and depression. Followed by quadraped wt bearing: cat/cow stretch, progressed  to alternating lifting UE/LE's, followed by quadraped to/from downward dog position, then from quadraped to prone and back x 5   Other Weight-Bearing Exercises 1 Plank position x 5 reps hold 5 sec. each. Progressed to modified push ups at wall x 10 reps. Followed by scapula retraction in high level BUE sh. flexion at wall along ball                  OT Short Term Goals - 02/07/16 1031      OT SHORT TERM GOAL #1   Title Pt will be Mod I HEP for AROM and coordination RUE   Time 4   Period Weeks   Status Achieved     OT SHORT TERM GOAL #2   Title Pt will demonstrate improved grip strength by 5# or more RUE as seen by JAMAR geip assessment   Baseline RUE 55# at Eval; 79# 02/07/16   Time 4   Period Weeks   Status Achieved     OT SHORT TERM GOAL #3   Title Pt will be Mod I stating 3 possible signs/symptoms/risk factors of CVA   Time 4   Period Weeks   Status Achieved           OT Long Term Goals - 02/27/16 1133      OT LONG TERM GOAL #1  Title Pt will be Mod I upgraded HEP for RUE   Time 8   Period Weeks   Status Achieved     OT LONG TERM GOAL #2   Title Pt will be Mod I simple meal/snack prep in ADL kitchen while maintaining safety   Time 8   Period Weeks   Status Achieved     OT LONG TERM GOAL #3   Title Pt will demonstrate AROM RUE WNL's w/o c/o joint tightness at end range for shoulder flexion (as compared to LUE)   Time 8   Period Weeks   Status On-going     OT LONG TERM GOAL #4   Title Pt will demonstrate improved MMT RUE of 4/5 or greater   Baseline MMT grossly 3/5 at eval on 01/09/16; MMT RUE 4-/5 02/20/16   Time 8   Period Weeks   Status On-going     OT LONG TERM GOAL #5   Title Pt will demonstrate improved grip strength RUE to 60# or greater RUE   Baseline See Eval 01/09/16; 02/20/16 = 75#   Time 8   Period Weeks   Status Achieved     Long Term Additional Goals   Additional Long Term Goals Yes     OT LONG TERM GOAL #6   Title Pt will  demonstrate improved 9 Hole Peg test score R UE to less than 1 min for improved coordination & functional hand use   Baseline 01/09/16 1 min 43 seconds; see 02/25/16 results in note   Time 8   Period Weeks   Status Achieved               Plan - 03/25/16 1012    Clinical Impression Statement Pt progressing towards LTG's. Pt with increased overall strength/endurance   Rehab Potential Good   OT Frequency 2x / week   OT Duration 4 weeks   OT Treatment/Interventions Self-care/ADL training;Fluidtherapy;DME and/or AE instruction;Splinting;Patient/family education;Balance training;Therapeutic exercises;Therapeutic exercise;Therapeutic activities;Cognitive remediation/compensation;Functional Mobility Training;Neuromuscular education;Manual Therapy;Visual/perceptual remediation/compensation   Plan Assess remaining LTG's and renew or d/c?       Patient will benefit from skilled therapeutic intervention in order to improve the following deficits and impairments:  Abnormal gait, Decreased coordination, Decreased range of motion, Impaired flexibility, Impaired sensation, Decreased activity tolerance, Cardiopulmonary status limiting activity, Decreased knowledge of precautions, Impaired UE functional use, Decreased knowledge of use of DME, Decreased balance, Decreased mobility, Decreased strength, Impaired vision/preception, Decreased cognition  Visit Diagnosis: Hemiplegia and hemiparesis following cerebral infarction affecting right dominant side (HCC)  Other lack of coordination  Muscle weakness (generalized)    Problem List Patient Active Problem List   Diagnosis Date Noted  . Acute CVA (cerebrovascular accident) ([place]) 01/05/2016  . Right sided weakness 01/05/2016  . Dyslipidemia associated with type 2 diabetes mellitus ([place]) 01/05/2016  . Hypokalemia 01/05/2016  . Type 2 diabetes mellitus with vascular disease ([place]) 10/21/2006    Carey Bullocks, OTR/L 03/25/2016, 10:15  AM  [facility] [address] [place], [place], [address] Phone: [phone]   Fax:  [phone]  Name: Mckay Fitzner MRN: [id number] Date of Birth: 22-Mar-1951

## 2016-03-27 ENCOUNTER — Encounter: Payer: Self-pay | Admitting: *Deleted

## 2016-03-27 ENCOUNTER — Ambulatory Visit: Payer: Medicare Other | Admitting: *Deleted

## 2016-03-27 ENCOUNTER — Ambulatory Visit: Payer: Medicare Other | Admitting: Physical Therapy

## 2016-03-27 DIAGNOSIS — R2681 Unsteadiness on feet: Secondary | ICD-10-CM

## 2016-03-27 DIAGNOSIS — R2689 Other abnormalities of gait and mobility: Secondary | ICD-10-CM

## 2016-03-27 DIAGNOSIS — M6281 Muscle weakness (generalized): Secondary | ICD-10-CM

## 2016-03-27 DIAGNOSIS — I69351 Hemiplegia and hemiparesis following cerebral infarction affecting right dominant side: Secondary | ICD-10-CM

## 2016-03-27 DIAGNOSIS — R278 Other lack of coordination: Secondary | ICD-10-CM

## 2016-03-27 NOTE — Therapy (Signed)
[facility] [address] [place], [place], [address] Phone: [phone]   Fax:  [phone]  Occupational Therapy Treatment  Patient Details  Name: Christian Clements MRN: [id number] Date of Birth: 1950/06/13 Referring Provider: Dr Rosalin Hawking  Encounter Date: 03/27/2016      OT End of Session - 03/27/16 1107    Visit Number 19   Number of Visits [place] Medicare Every 10 visits   Authorization - Visit Number 9   Authorization - Number of Visits 10   OT Start Time 1017   OT Stop Time 1059   OT Time Calculation (min) 42 min   Activity Tolerance Patient tolerated treatment well   Behavior During Therapy WFL for tasks assessed/performed      Past Medical History:  Diagnosis Date  . Diabetes mellitus   . Hypertension   . Prostatitis 2011    History reviewed. No pertinent surgical history.  There were no vitals filed for this visit.      Subjective Assessment - 03/27/16 1019    Subjective  Denies pain. "I jogged this past week" At the gym.   Pertinent History see Epic   Currently in Pain? No/denies   Pain Score 0-No pain                      OT Treatments/Exercises (OP) - 03/27/16 1020      Exercises   Exercises Neurological Re-education     Fine Motor Coordination   Other Fine Motor Exercises Coins and pegs in red putty - removing with right. In hand manipulation with 2in balls - rotating in hand, min-mod difficulty noted. Unable to change direction.     Neurological Re-education Exercises   Scapular Stabilization Right;Prone  Planks x8 reps, hold x10 seconds each   Shoulder Flexion AAROM;Right;Standing  Holding ball - raising bilateral UE's over head, hold x3-5   Other Exercises 1 Standing at wall for shoulder flexion and ABD ex's hold x5 count 2 sets of 10 each for shoulder A/AROM and stretch   Diagonal Patterns Standing;Other Min-Mod A  required Standing on BOSU and reaching into cabinet remove cones   Reciprocal Movements UBE level 10 x8 min reciprocal movement, strengthening                OT Education - 03/27/16 1106    Education provided Yes   Education Details Encourage stretch RUE before ex's as he was doing previously.    Person(s) Educated Patient   Methods Explanation;Demonstration   Comprehension Verbalized understanding;Returned demonstration          OT Short Term Goals - 02/07/16 1031      OT SHORT TERM GOAL #1   Title Pt will be Mod I HEP for AROM and coordination RUE   Time 4   Period Weeks   Status Achieved     OT SHORT TERM GOAL #2   Title Pt will demonstrate improved grip strength by 5# or more RUE as seen by JAMAR geip assessment   Baseline RUE 55# at Eval; 79# 02/07/16   Time 4   Period Weeks   Status Achieved     OT SHORT TERM GOAL #3   Title Pt will be Mod I stating 3 possible signs/symptoms/risk factors of CVA   Time 4   Period Weeks   Status Achieved           OT Long Term Goals - 02/27/16  [place] #1   Title Pt will be Mod I upgraded HEP for RUE   Time 8   Period Weeks   Status Achieved     OT LONG TERM GOAL #2   Title Pt will be Mod I simple meal/snack prep in ADL kitchen while maintaining safety   Time 8   Period Weeks   Status Achieved     OT LONG TERM GOAL #3   Title Pt will demonstrate AROM RUE WNL's w/o c/o joint tightness at end range for shoulder flexion (as compared to LUE)   Time 8   Period Weeks   Status On-going     OT LONG TERM GOAL #4   Title Pt will demonstrate improved MMT RUE of 4/5 or greater   Baseline MMT grossly 3/5 at eval on 01/09/16; MMT RUE 4-/5 02/20/16   Time 8   Period Weeks   Status On-going     OT LONG TERM GOAL #5   Title Pt will demonstrate improved grip strength RUE to 60# or greater RUE   Baseline See Eval 01/09/16; 02/20/16 = 75#   Time 8   Period Weeks   Status Achieved     Long Term  Additional Goals   Additional Long Term Goals Yes     OT LONG TERM GOAL #6   Title Pt will demonstrate improved 9 Hole Peg test score R UE to less than 1 min for improved coordination & functional hand use   Baseline 01/09/16 1 min 43 seconds; see 02/25/16 results in note   Time 8   Period Weeks   Status Achieved               Plan - 03/27/16 1108    Clinical Impression Statement Pt progressing with strengthening/endurance RUE. He should benefit from regular stretching of RUE prior to ex at home. He cont to work on [place] #3 & 4.   Rehab Potential Good   OT Frequency 2x / week   OT Duration 4 weeks   OT Treatment/Interventions Self-care/ADL training;Fluidtherapy;DME and/or AE instruction;Splinting;Patient/family education;Balance training;Therapeutic exercises;Therapeutic exercise;Therapeutic activities;Cognitive remediation/compensation;Functional Mobility Training;Neuromuscular education;Manual Therapy;Visual/perceptual remediation/compensation   Plan G-code this visit. Strengthening RUE (anticipate d/c in next 4 visits as discussed with pt).   Consulted and Agree with Plan of Care Patient      Patient will benefit from skilled therapeutic intervention in order to improve the following deficits and impairments:  Abnormal gait, Decreased coordination, Decreased range of motion, Impaired flexibility, Impaired sensation, Decreased activity tolerance, Cardiopulmonary status limiting activity, Decreased knowledge of precautions, Impaired UE functional use, Decreased knowledge of use of DME, Decreased balance, Decreased mobility, Decreased strength, Impaired vision/preception, Decreased cognition  Visit Diagnosis: Other lack of coordination  Muscle weakness (generalized)  Hemiplegia and hemiparesis following cerebral infarction affecting right dominant side [facility])    Problem List Patient Active Problem List   Diagnosis Date Noted  . Acute CVA (cerebrovascular accident) ([place])  01/05/2016  . Right sided weakness 01/05/2016  . Dyslipidemia associated with type 2 diabetes mellitus ([place]) 01/05/2016  . Hypokalemia 01/05/2016  . Type 2 diabetes mellitus with vascular disease ([place]) 10/21/2006    [place], Amy Ardath Sax, OTR/L 03/27/2016, 11:13 AM  [facility] [address] [place], [place], [address] Phone: [phone]   Fax:  [phone]  Name: Kenlee Keuler MRN: [id number] Date of Birth: 1950-04-12

## 2016-03-27 NOTE — Therapy (Signed)
[facility] [address] [place], [place], [address] Phone: [phone]   Fax:  [phone]  Physical Therapy Treatment  Patient Details  Name: Christian Clements MRN: [id number] Date of Birth: 01/27/1951 Referring Provider: Xu Jindong, MD  Encounter Date: 03/27/2016      PT End of Session - 03/27/16 1031    Visit Number 16   Number of Visits 25   Date for PT Re-Evaluation 04/26/16   Authorization Type [employer] Medicare   Authorization Time Period G-code every 10th visit; 03-27-16 - 05-25-16   PT Start Time 0935   PT Stop Time 1015   PT Time Calculation (min) 40 min      Past Medical History:  Diagnosis Date  . Diabetes mellitus   . Hypertension   . Prostatitis 2011    No past surgical history on file.  There were no vitals filed for this visit.      Subjective Assessment - 03/27/16 1026    Subjective Pt reports today is last day that he has to wear monitor - has not been able to go to pool because he has had to wear monitor for 30 days; reports he was able to jog on treadmill for first time   Pertinent History Likes to be called "[name]".  PMH significant: HTN and DM II,   Patient Stated Goals "I want to be able to move around and do things I did before I had this stroke. I like to golf and I work out 5-6 times per week."   Currently in Pain? No/denies                         [facility] Adult PT Treatment/Exercise - 03/27/16 0949      Ambulation/Gait   Ambulation/Gait Yes   Ambulation/Gait Assistance 5: Supervision   Ambulation/Gait Assistance Details slight lateral trunk lean to R with decreased push off RLE due to weak PF and occasional R genu recurvatum  for gait analysis   Ambulation Distance (Feet) 175 Feet   Assistive device None   Gait Pattern Step-through pattern   Ambulation Surface Level;Indoor     Knee/Hip Exercises: Standing   Hip Extension AROM;Right;10 reps;Knee bent;2 sets      Knee/Hip Exercises: Sidelying   Hip ABduction AROM;Right;10 reps;Strengthening;2 sets  no weight x 10; 2# x 10   Clams 5#, 2 sets of 10     Knee/Hip Exercises: Prone   Hamstring Curl 3 sets;10 reps  5#   Hip Extension Strengthening;2 sets;10 reps  knee bent     Manual muscle test grades RLE:  R hip abdct - 4-/5:  R hamstrings 3+/5:  Hip ext  3+/5:  PF 3-/5  TherAct; Pt performed jumping forward and backward inside // bars, also sideways over yardstick - no UE support used R single limb hopping approx. 10 times with use of bar as needed with LOB  Amb. Forward and backward on tiptoes with knees flexed for PF and hamstring strengthening - 4 reps; laterally with knees flexed and on Tiptoes 2 reps inside bars  Jogging - initially inside bars 10' x 4, then outside of bars on track 40' x 4 reps with SBA and verbal cues for increased step length and forward progression         PT Short Term Goals - 02/06/16 0827      PT SHORT TERM GOAL #1   Title Pt will be independent and verbalize understanding of   initial HEP to continue progress made in therapy. (Target Date for all STGs: 02/07/16)   Baseline Met 11/15.   Time 4   Period Weeks   Status Achieved     PT SHORT TERM GOAL #2   Title Pt will improve FGA to 18/30 to indicate improved functional gait and mobility.   Baseline 11/15: FGA = 22/30   Time 4   Period Weeks   Status Achieved     PT SHORT TERM GOAL #3   Title Pt will improve 5 times sit to stand to < or = 12 sec indicating a decr risk of falls.   Baseline 11/15: 5x STS = 8.17 seconds   Time 4   Period Weeks   Status Achieved     PT SHORT TERM GOAL #4   Title Pt will negotiate 8 stairs with supervision and no UE support to incr safety negotiating stairs at home.   Baseline Met 11/15.   Time 4   Period Weeks   Status Achieved     PT SHORT TERM GOAL #5   Title Pt will ambulate 500' with mod I over level surfaces, ramps, and curbs to incr safey ambulating at home.    Baseline Met 11/15.   Time 4   Period Weeks   Status Achieved           PT Long Term Goals - 03/27/16 1041      PT LONG TERM GOAL #1   Title Pt will be independent and verbalize understanding of updated HEP and on-going fitness plan to maintain progress made in therapy and his active lifestyle. (Target Date for all LTGs: 04-27-16)   Status Revised     PT LONG TERM GOAL #2   Title Pt will improve FGA score to > or = 23/30 to indicate decr fall risk and improved functional mobility. UPGRADE to 30/30 - target date 04-27-16   Baseline 03/04/16: 26/30 scored today   Time 4   Period Weeks   Status Revised     PT LONG TERM GOAL #3   Title Pt will improve gait speed to > 2.62 ft/sec to indicate status of community ambulator. upgrade to >/= 3.9 ft/sec - target date 04-27-16   Baseline 03/04/16: 3.45 ft/sec today with no AD   Time 4   Period Weeks   Status Revised     PT LONG TERM GOAL #4   Title Pt will negotiate 15 stairs with mod I and no UE support to improve safety with stairs at home.  04-27-16   Baseline 03/04/16: negotiated 16 stairs reciprocally with supervision due to knee instability/occasional recurvatum   Status On-going     PT LONG TERM GOAL #5   Title Jog 360' modified independently with no occurrence of R genu recurvatum.   Baseline 40" x3 with short rest breaks   Time 4   Period Weeks   Status New     Additional Long Term Goals   Additional Long Term Goals Yes     PT LONG TERM GOAL #6   Title Pt will increase strength in RLE so that he is able to hop on R leg only 10 x without UE support and no LOB. 04-27-16   Baseline pt able to hop 5 reps with LOB - performed inside bars with UE support prn   Time 4   Period Weeks   Status New               Plan - 03/27/16   1032    Clinical Impression Statement Pt continues to have weakness in RLE, specifically hip abdct, extensors, hamstrings and PF which contribute to gait deviations and R knee instability with genu  recurvatum. R PF are increasing in strength. Pt will benefit from continued PT to address high level balance and gait activities and to minimize gait deviiations.     Rehab Potential Excellent   Clinical Impairments Affecting Rehab Potential HTN, Type 2 DM   PT Frequency 2x / week   PT Duration 4 weeks   PT Treatment/Interventions ADLs/Self Care Home Management;Functional mobility training;Stair training;Gait training;Therapeutic activities;Therapeutic exercise;Balance training;Neuromuscular re-education;Patient/family education;Manual techniques;Energy conservation   PT Next Visit Plan Add hip strengthening exercises (needs handout)- abdct, extensors, hamstrings and PF to HEP (against gravity); work on R knee control with theraband; cont; jumping, hopping, jogging   PT Home Exercise Plan bridging, water activites, sit<>stands, Tband-resisted R TKE, tandem gait   Consulted and Agree with Plan of Care Patient      Patient will benefit from skilled therapeutic intervention in order to improve the following deficits and impairments:  Abnormal gait, Decreased activity tolerance, Decreased knowledge of precautions, Decreased endurance, Decreased strength, Impaired perceived functional ability, Impaired sensation, Improper body mechanics, Impaired UE functional use, Decreased coordination  Visit Diagnosis: Other abnormalities of gait and mobility - Plan: PT plan of care cert/re-cert  Unsteadiness on feet - Plan: PT plan of care cert/re-cert  Muscle weakness (generalized) - Plan: PT plan of care cert/re-cert     Problem List Patient Active Problem List   Diagnosis Date Noted  . Acute CVA (cerebrovascular accident) (HCC) 01/05/2016  . Right sided weakness 01/05/2016  . Dyslipidemia associated with type 2 diabetes mellitus (HCC) 01/05/2016  . Hypokalemia 01/05/2016  . Type 2 diabetes mellitus with vascular disease (HCC) 10/21/2006    ,  Suzanne, PT 03/27/2016, 11:00 AM  [facility] [address] [place], [place], [address] Phone: [phone]   Fax:  [phone]  Name: Christian Clements MRN: [id number] Date of Birth: 04/16/1950    

## 2016-04-01 ENCOUNTER — Ambulatory Visit: Payer: Medicare Other | Admitting: Occupational Therapy

## 2016-04-01 ENCOUNTER — Encounter: Payer: Self-pay | Admitting: Physical Therapy

## 2016-04-01 ENCOUNTER — Ambulatory Visit: Payer: Medicare Other | Admitting: Physical Therapy

## 2016-04-01 DIAGNOSIS — M6281 Muscle weakness (generalized): Secondary | ICD-10-CM

## 2016-04-01 DIAGNOSIS — I69351 Hemiplegia and hemiparesis following cerebral infarction affecting right dominant side: Secondary | ICD-10-CM | POA: Diagnosis not present

## 2016-04-01 DIAGNOSIS — R278 Other lack of coordination: Secondary | ICD-10-CM

## 2016-04-01 DIAGNOSIS — R2689 Other abnormalities of gait and mobility: Secondary | ICD-10-CM

## 2016-04-01 DIAGNOSIS — R2681 Unsteadiness on feet: Secondary | ICD-10-CM

## 2016-04-01 NOTE — Therapy (Signed)
[facility] [address] [place], [place], [address] Phone: [phone]   Fax:  [phone]  Physical Therapy Treatment  Patient Details  Name: Christian Clements MRN: [id number] Date of Birth: 02/09/1951 Referring Provider: Rosalin Hawking, MD  Encounter Date: 04/01/2016      PT End of Session - 04/01/16 0810    Visit Number 17   Number of Visits 25   Date for PT Re-Evaluation 04/26/16   Authorization Type [employer] Medicare   Authorization Time Period G-code every 10th visit; 03-27-16 - 05-25-16   PT Start Time 0805   PT Stop Time 0846   PT Time Calculation (min) 41 min      Past Medical History:  Diagnosis Date  . Diabetes mellitus   . Hypertension   . Prostatitis 2011    History reviewed. No pertinent surgical history.  There were no vitals filed for this visit.      Subjective Assessment - 04/01/16 0810    Subjective No new complaints. Cardiac monitor is off, sent it in on Friday. No falls or pain to report.    Patient is accompained by: Family member   Pertinent History Likes to be called "[name]".  PMH significant: HTN and DM II,   Patient Stated Goals "I want to be able to move around and do things I did before I had this stroke. I like to golf and I work out 5-6 times per week."   Currently in Pain? No/denies   Pain Score 0-No pain            [facility] Adult PT Treatment/Exercise - 04/01/16 0815      Lumbar Exercises: Quadruped   Straight Leg Raise 10 reps;Limitations  1 set, no weight, right leg only   Straight Leg Raises Limitations cues on posture and correct ex form/technique     Knee/Hip Exercises: Standing   Hip Abduction AROM;Stengthening;Right;3 sets;10 reps;Knee straight;Limitations   Abduction Limitations with green theraband resistance, cues on posture and ex form   Hip Extension AROM;Stengthening;Right;3 sets;10 reps;Knee straight;Limitations   Extension Limitations with green theraband  resistance, cues on ex form and posture   Other Standing Knee Exercises in quadruped: glut kick backs x 10 reps with cues on form and technique.     Knee/Hip Exercises: Prone   Hamstring Curl 10 reps;3 sets;Limitations  5# ankle weight   Hamstring Curl Limitations cues on form and ex technique   Hip Extension Strengthening;10 reps;3 sets;AROM;Limitations   Hip Extension Limitations with knee bend for glut kick backs, cues needed on ex form and technique   Straight Leg Raises AROM;Strengthening;Right;3 sets;10 reps;Limitations   Straight Leg Raises Limitations cues on form and technique           PT Education - 04/01/16 0844    Education provided Yes   Education Details HEP: for right hip strengthening   Person(s) Educated Patient   Methods Explanation;Verbal cues;Handout   Comprehension Verbalized understanding;Returned demonstration          PT Short Term Goals - 02/06/16 0827      PT SHORT TERM GOAL #1   Title Pt will be independent and verbalize understanding of initial HEP to continue progress made in therapy. (Target Date for all STGs: 02/07/16)   Baseline Met 11/15.   Time 4   Period Weeks   Status Achieved     PT SHORT TERM GOAL #2   Title Pt will improve FGA to 18/30 to indicate improved functional gait and  mobility.   Baseline 11/15: FGA = 22/30   Time 4   Period Weeks   Status Achieved     PT SHORT TERM GOAL #3   Title Pt will improve 5 times sit to stand to < or = 12 sec indicating a decr risk of falls.   Baseline 11/15: 5x STS = 8.17 seconds   Time 4   Period Weeks   Status Achieved     PT SHORT TERM GOAL #4   Title Pt will negotiate 8 stairs with supervision and no UE support to incr safety negotiating stairs at home.   Baseline Met 11/15.   Time 4   Period Weeks   Status Achieved     PT SHORT TERM GOAL #5   Title Pt will ambulate 500' with mod I over level surfaces, ramps, and curbs to incr safey ambulating at home.   Baseline Met 11/15.    Time 4   Period Weeks   Status Achieved           PT Long Term Goals - 03/27/16 1041      PT LONG TERM GOAL #1   Title Pt will be independent and verbalize understanding of updated HEP and on-going fitness plan to maintain progress made in therapy and his active lifestyle. (Target Date for all LTGs: 04-27-16)   Status Revised     PT LONG TERM GOAL #2   Title Pt will improve FGA score to > or = 23/30 to indicate decr fall risk and improved functional mobility. UPGRADE to 30/30 - target date 04-27-16   Baseline 03/04/16: 26/30 scored today   Time 4   Period Weeks   Status Revised     PT LONG TERM GOAL #3   Title Pt will improve gait speed to > 2.62 ft/sec to indicate status of community ambulator. upgrade to >/= 3.9 ft/sec - target date 04-27-16   Baseline 03/04/16: 3.45 ft/sec today with no AD   Time 4   Period Weeks   Status Revised     PT LONG TERM GOAL #4   Title Pt will negotiate 15 stairs with mod I and no UE support to improve safety with stairs at home.  04-27-16   Baseline 03/04/16: negotiated 16 stairs reciprocally with supervision due to knee instability/occasional recurvatum   Status On-going     PT LONG TERM GOAL #5   Title Jog 360' modified independently with no occurrence of R genu recurvatum.   Baseline 40" x3 with short rest breaks   Time 4   Period Weeks   Status New     Additional Long Term Goals   Additional Long Term Goals Yes     PT LONG TERM GOAL #6   Title Pt will increase strength in RLE so that he is able to hop on R leg only 10 x without UE support and no LOB. 04-27-16   Baseline pt able to hop 5 reps with LOB - performed inside bars with UE support prn   Time 4   Period Weeks   Status New               Plan - 04/01/16 0810    Clinical Impression Statement Today's session focused on advancing HEP for right LE strengthenng. No issues reported with session today. Pt is making steady progress toward goals and should benefit from continued PT to  progress toward unmet goals.    Rehab Potential Excellent   Clinical Impairments Affecting Rehab Potential  HTN, Type 2 DM   PT Frequency 2x / week   PT Duration 4 weeks   PT Treatment/Interventions ADLs/Self Care Home Management;Functional mobility training;Stair training;Gait training;Therapeutic activities;Therapeutic exercise;Balance training;Neuromuscular re-education;Patient/family education;Manual techniques;Energy conservation   PT Next Visit Plan work on R knee control with theraband; cont; jumping, hopping, jogging   PT Home Exercise Plan bridging, water activites, sit<>stands, Tband-resisted R TKE, tandem gait; 04/01/16: added prone, quadruped right LE ex's, standing with band hip abdct and ext   Consulted and Agree with Plan of Care Patient      Patient will benefit from skilled therapeutic intervention in order to improve the following deficits and impairments:  Abnormal gait, Decreased activity tolerance, Decreased knowledge of precautions, Decreased endurance, Decreased strength, Impaired perceived functional ability, Impaired sensation, Improper body mechanics, Impaired UE functional use, Decreased coordination  Visit Diagnosis: Other abnormalities of gait and mobility  Unsteadiness on feet  Muscle weakness (generalized)     Problem List Patient Active Problem List   Diagnosis Date Noted  . Acute CVA (cerebrovascular accident) ([facility]) 01/05/2016  . Right sided weakness 01/05/2016  . Dyslipidemia associated with type 2 diabetes mellitus ([facility]) 01/05/2016  . Hypokalemia 01/05/2016  . Type 2 diabetes mellitus with vascular disease ([facility]) 10/21/2006    Willow Ora, PTA, [place] [address], [place], [place] [address] [phone] 04/01/16, 9:21 AM   Name: Starling Christofferson MRN: [id number] Date of Birth: [birth year]

## 2016-04-01 NOTE — Patient Instructions (Addendum)
For right hip/LE strenthening  Knee Flexion    Bend knee as far as possible. Use __5__ lbs on ankle.  Repeat _10___ times.  3 sets. Do _1-2___ sessions per day.  http://[email or web address].exer.us/389   Copyright  VHI. All rights reserved.   Bent Knee Lift (Prone)    Abdomen and head supported, bend right knee and slowly raise leg toward ceiling. Avoid arching low back. Repeat __10__ times per set. Do __3__ sets per session. Do __1-2__ sessions per day.  http://orth.exer.us/1110   Copyright  VHI. All rights reserved.    EXTENSION: Prone - Knee Extended (Active)    Lie on stomach, legs straight. Lift right leg toward ceiling. Use __0_ lbs for now. Complete _3__ sets of __10_ repetitions. Perform _1-2__ sessions per day.  http://gtsc.exer.us/70   Copyright  VHI. All rights reserved.     Strengthening: Hip Abduction - Resisted    With tubing tied around ankles, extend right leg out to the side and then slowly back in. Repeat __10__ times per set. Do _3_ sets per session. Do _1-2__ sessions per day.  http://orth.exer.us/634   Copyright  VHI. All rights reserved.   Strengthening: Hip Extension - Resisted    With tubing around both ankles, pull right leg straight back and then slowly back in.  Repeat _10__ times per set. Do _3_ sets per session. Do _1-2_ sessions per day.  http://orth.exer.us/636   Copyright  VHI. All rights reserved.   Hip Extension (All-Fours)    Lift right leg back with knee slightly flexed. Do not arch neck or back. Hold for 3 seconds. Repeat _10___ times per set. Do __1__ sets per session. Do _1-2_ sessions per day.  http://orth.exer.us/106   Copyright  VHI. All rights reserved.   Quadruped: Hip Extension / Knee Flexion (Active)    On hands and knees, lift right leg with knee bent, keeping knee bent. Use _0_ lbs. Complete __1_ sets of __10_ repetitions. Perform _1-2__ sessions per day.  Copyright  VHI. All rights reserved.

## 2016-04-01 NOTE — Therapy (Addendum)
[facility] [address] [place], [place], [address] Phone: [phone]   Fax:  [phone]  Occupational Therapy Treatment  Patient Details  Name: Christian Clements MRN: [id number] Date of Birth: 12/28/1950 Referring Provider: Dr Rosalin Hawking  Encounter Date: 04/01/2016      OT End of Session - 04/01/16 0901    Visit Number 20   Number of Visits [place] Medicare Every 10 visits   Authorization - Visit Number 10   Authorization - Number of Visits 20   OT Start Time [phone]   OT Stop Time 0932   OT Time Calculation (min) 40 min   Activity Tolerance Patient tolerated treatment well   Behavior During Therapy [facility] for tasks assessed/performed      Past Medical History:  Diagnosis Date  . Diabetes mellitus   . Hypertension   . Prostatitis 2011    No past surgical history on file.  There were no vitals filed for this visit.      Subjective Assessment - 04/01/16 0911    Subjective  Pt reports stretching really helps   Pertinent History see Epic   Currently in Pain? No/denies       In standing, AAROM shoulder flex with ball x15 for end range stretching.    In prone, plank position for incr core/scapular stability with 10sec hold x6.  Then scapular retraction in prone with shoulders in flex, ext, and abduction x10 each.  In standing, functional reaching to place small pegs in vertical pegboard to copy design incorporating trunk rotation/wt. Shift to challenge balance.  Min cueing/difficulty for balance (compensates by avoiding rotation) and good accuracy/coordination with placing in pegs.  Removing using in-hand manipulation with min difficulty for incr coordination.  In standing, wall slides for shoulder flex and abduction stretches followed by ER doorway stretch with min cues initially for positioning.  In standing, AAROM BUEs with 2lb weighted ball in diagonals,  chest press, and shoulder flex (floor>shoulder height) x5 each with min cues.                         OT Short Term Goals - 02/07/16 1031      OT SHORT TERM GOAL #1   Title Pt will be Mod I HEP for AROM and coordination RUE   Time 4   Period Weeks   Status Achieved     OT SHORT TERM GOAL #2   Title Pt will demonstrate improved grip strength by 5# or more RUE as seen by JAMAR geip assessment   Baseline RUE 55# at Eval; 79# 02/07/16   Time 4   Period Weeks   Status Achieved     OT SHORT TERM GOAL #3   Title Pt will be Mod I stating 3 possible signs/symptoms/risk factors of CVA   Time 4   Period Weeks   Status Achieved           OT Long Term Goals - 02/27/16 1133      OT LONG TERM GOAL #1   Title Pt will be Mod I upgraded HEP for RUE   Time 8   Period Weeks   Status Achieved     OT LONG TERM GOAL #2   Title Pt will be Mod I simple meal/snack prep in ADL kitchen while maintaining safety   Time 8   Period Weeks   Status Achieved     OT LONG TERM  GOAL #3   Title Pt will demonstrate AROM RUE WNL's w/o c/o joint tightness at end range for shoulder flexion (as compared to LUE)   Time 8   Period Weeks   Status On-going     OT LONG TERM GOAL #4   Title Pt will demonstrate improved MMT RUE of 4/5 or greater   Baseline MMT grossly 3/5 at eval on 01/09/16; MMT RUE 4-/5 02/20/16   Time 8   Period Weeks   Status On-going     OT LONG TERM GOAL #5   Title Pt will demonstrate improved grip strength RUE to 60# or greater RUE   Baseline See Eval 01/09/16; 02/20/16 = 75#   Time 8   Period Weeks   Status Achieved     Long Term Additional Goals   Additional Long Term Goals Yes     OT LONG TERM GOAL #6   Title Pt will demonstrate improved 9 Hole Peg test score R UE to less than 1 min for improved coordination & functional hand use   Baseline 01/09/16 1 min 43 seconds; see 02/25/16 results in note   Time 8   Period Weeks   Status Achieved                Plan - 01-May-2016 0904    Clinical Impression Statement Pt continues to progress with RUE strength/endurance.  Pt reports that he has been stretching more at home.   Rehab Potential Good   OT Frequency 2x / week   OT Duration 4 weeks   OT Treatment/Interventions Self-care/ADL training;Fluidtherapy;DME and/or AE instruction;Splinting;Patient/family education;Balance training;Therapeutic exercises;Therapeutic exercise;Therapeutic activities;Cognitive remediation/compensation;Functional Mobility Training;Neuromuscular education;Manual Therapy;Visual/perceptual remediation/compensation   Plan send re-cert next visit?  (possible d/c in 3 visits per previous plan)   Consulted and Agree with Plan of Care Patient      Patient will benefit from skilled therapeutic intervention in order to improve the following deficits and impairments:  Abnormal gait, Decreased coordination, Decreased range of motion, Impaired flexibility, Impaired sensation, Decreased activity tolerance, Cardiopulmonary status limiting activity, Decreased knowledge of precautions, Impaired UE functional use, Decreased knowledge of use of DME, Decreased balance, Decreased mobility, Decreased strength, Impaired vision/preception, Decreased cognition  Visit Diagnosis: Hemiplegia and hemiparesis following cerebral infarction affecting right dominant side ([place])  Other lack of coordination  Unsteadiness on feet        G-Codes - 05/01/2016 1226    Functional Assessment Tool Used clinical judgement    Functional Limitation Carrying, moving and handling objects   Carrying, Moving and Handling Objects Current Status [phone]) At least 1 percent but less than 20 percent impaired, limited or restricted   Carrying, Moving and Handling Objects Goal Status [id number]) At least 1 percent but less than 20 percent impaired, limited or restricted       Problem List Patient Active Problem List   Diagnosis Date Noted  . Acute CVA  (cerebrovascular accident) ([place]) 01/05/2016  . Right sided weakness 01/05/2016  . Dyslipidemia associated with type 2 diabetes mellitus ([place]) 01/05/2016  . Hypokalemia 01/05/2016  . Type 2 diabetes mellitus with vascular disease ([place]) 10/21/2006    Occupational Therapy Progress Note  Dates of Reporting Period: 02/12/17 to 2016-05-01  Objective Reports of Subjective Statement: Pt reports that he is doing well, but continues to be stiff, but stretching really helps.  I want to be able to play golf.  Objective Measurements: See above goal sections  Goal Update: see above   Plan/Reason Skilled Services are Required: Pt  would benefit from continued occupational therapy to address remaining RUE stiffness, strength, endurance (with anticipated d/c next week) for return to previous IADL and leisure activities.Marland Kitchen  [facility] 04/01/2016, 12:27 PM  [facility] [address] [place], [place], [address] Phone: [phone]   Fax:  [phone]  Name: Christian Clements MRN: [id number] Date of Birth: 06-02-1950   Vianne Bulls, OTR/L [facility] [address] [place], [place]  [address] [phone] phone [phone] 04/01/16 12:27 PM

## 2016-04-03 ENCOUNTER — Ambulatory Visit: Payer: Medicare Other | Admitting: Physical Therapy

## 2016-04-03 ENCOUNTER — Ambulatory Visit: Payer: Medicare Other | Admitting: Occupational Therapy

## 2016-04-03 ENCOUNTER — Encounter: Payer: Self-pay | Admitting: Physical Therapy

## 2016-04-03 DIAGNOSIS — I69351 Hemiplegia and hemiparesis following cerebral infarction affecting right dominant side: Secondary | ICD-10-CM

## 2016-04-03 DIAGNOSIS — R2689 Other abnormalities of gait and mobility: Secondary | ICD-10-CM

## 2016-04-03 DIAGNOSIS — M6281 Muscle weakness (generalized): Secondary | ICD-10-CM

## 2016-04-03 DIAGNOSIS — R2681 Unsteadiness on feet: Secondary | ICD-10-CM

## 2016-04-03 DIAGNOSIS — R278 Other lack of coordination: Secondary | ICD-10-CM

## 2016-04-03 NOTE — Therapy (Signed)
[facility] [address] [place], [place], [address] Phone: [phone]   Fax:  [phone]  Physical Therapy Treatment  Patient Details  Name: Christian Clements MRN: [id number] Date of Birth: 1950/06/26 Referring Provider: Rosalin Hawking, MD  Encounter Date: 04/03/2016      PT End of Session - 04/03/16 0851    Visit Number 18   Number of Visits 25   Date for PT Re-Evaluation 04/26/16   Authorization Type [employer] Medicare   Authorization Time Period G-code every 10th visit; 03-27-16 - 05-25-16   PT Start Time 0848   PT Stop Time 0930   PT Time Calculation (min) 42 min   Equipment Utilized During Treatment Gait belt   Activity Tolerance Patient tolerated treatment well   Behavior During Therapy [facility] for tasks assessed/performed      Past Medical History:  Diagnosis Date  . Diabetes mellitus   . Hypertension   . Prostatitis 2011    History reviewed. No pertinent surgical history.  There were no vitals filed for this visit.      Subjective Assessment - 04/03/16 0851    Subjective No new complaints. No pain or falls to report.    Pertinent History Likes to be called "[name]".  PMH significant: HTN and DM II,   Patient Stated Goals "I want to be able to move around and do things I did before I had this stroke. I like to golf and I work out 5-6 times per week."   Currently in Pain? No/denies   Pain Score 0-No pain            [facility] Adult PT Treatment/Exercise - 04/03/16 7342      Neuro Re-ed    Neuro Re-ed Details  dynamic gait/balance: forward walking on toes while in squat position x 60 feet x 2 reps; side stepping on toes in squat position x 60 feet x 2 reps; with ladder: jumping out<>in of boxes x 6 laps with cues on coordination, jump technique, fwd/bwd bil LE jumping over slat of ladder, lateral jumping over slat of ladder, and scissor jumps over slat of ladder x 10-12 jumpes each, side stepping each foot in each  box along length of ladder x 4 laps total. min guard assist.                                           Lumbar Exercises: Quadruped   Straight Leg Raise 10 reps;Limitations  2 sets of 10 reps   Straight Leg Raises Limitations cues on posture and correct ex form/technique     Knee/Hip Exercises: Standing   Heel Raises Both;2 sets;10 reps;Limitations   Heel Raises Limitations heels off edge of bottom step, lifting up and lowering all the way down, cues on posture and ex form     Knee/Hip Exercises: Seated   Stool Scoot - Round Trips 60 feet x2 reps with right LE only pulling forward, supervision with cues on posture/ex form      Knee/Hip Exercises: Prone   Hamstring Curl 10 reps;Limitations;3 sets   Hamstring Curl Limitations cues on form and ex technique   Straight Leg Raises AROM;Strengthening;Right;3 sets;10 reps;Limitations   Straight Leg Raises Limitations cues on form and technique             PT Short Term Goals - 02/06/16 0827      PT  SHORT TERM GOAL #1   Title Pt will be independent and verbalize understanding of initial HEP to continue progress made in therapy. (Target Date for all STGs: 02/07/16)   Baseline Met 11/15.   Time 4   Period Weeks   Status Achieved     PT SHORT TERM GOAL #2   Title Pt will improve FGA to 18/30 to indicate improved functional gait and mobility.   Baseline 11/15: FGA = 22/30   Time 4   Period Weeks   Status Achieved     PT SHORT TERM GOAL #3   Title Pt will improve 5 times sit to stand to < or = 12 sec indicating a decr risk of falls.   Baseline 11/15: 5x STS = 8.17 seconds   Time 4   Period Weeks   Status Achieved     PT SHORT TERM GOAL #4   Title Pt will negotiate 8 stairs with supervision and no UE support to incr safety negotiating stairs at home.   Baseline Met 11/15.   Time 4   Period Weeks   Status Achieved     PT SHORT TERM GOAL #5   Title Pt will ambulate 500' with mod I over level surfaces, ramps, and curbs to incr  safey ambulating at home.   Baseline Met 11/15.   Time 4   Period Weeks   Status Achieved           PT Long Term Goals - 03/27/16 1041      PT LONG TERM GOAL #1   Title Pt will be independent and verbalize understanding of updated HEP and on-going fitness plan to maintain progress made in therapy and his active lifestyle. (Target Date for all LTGs: 04-27-16)   Status Revised     PT LONG TERM GOAL #2   Title Pt will improve FGA score to > or = 23/30 to indicate decr fall risk and improved functional mobility. UPGRADE to 30/30 - target date 04-27-16   Baseline 03/04/16: 26/30 scored today   Time 4   Period Weeks   Status Revised     PT LONG TERM GOAL #3   Title Pt will improve gait speed to > 2.62 ft/sec to indicate status of community ambulator. upgrade to >/= 3.9 ft/sec - target date 04-27-16   Baseline 03/04/16: 3.45 ft/sec today with no AD   Time 4   Period Weeks   Status Revised     PT LONG TERM GOAL #4   Title Pt will negotiate 15 stairs with mod I and no UE support to improve safety with stairs at home.  04-27-16   Baseline 03/04/16: negotiated 16 stairs reciprocally with supervision due to knee instability/occasional recurvatum   Status On-going     PT LONG TERM GOAL #5   Title Jog 360' modified independently with no occurrence of R genu recurvatum.   Baseline 40" x3 with short rest breaks   Time 4   Period Weeks   Status New     Additional Long Term Goals   Additional Long Term Goals Yes     PT LONG TERM GOAL #6   Title Pt will increase strength in RLE so that he is able to hop on R leg only 10 x without UE support and no LOB. 04-27-16   Baseline pt able to hop 5 reps with LOB - performed inside bars with UE support prn   Time 4   Period Weeks   Status New  Plan - 04/03/16 0851    Clinical Impression Statement today's session continued to address right LE strengthening and high level balance activities with only fatigue reported. Pt is making steady  progress toward goals and should benefit from continued PT to progress toward unmet goals.    Rehab Potential Excellent   Clinical Impairments Affecting Rehab Potential HTN, Type 2 DM   PT Frequency 2x / week   PT Duration 4 weeks   PT Treatment/Interventions ADLs/Self Care Home Management;Functional mobility training;Stair training;Gait training;Therapeutic activities;Therapeutic exercise;Balance training;Neuromuscular re-education;Patient/family education;Manual techniques;Energy conservation   PT Next Visit Plan work on R knee control with theraband; cont; jumping, hopping, jogging   PT Home Exercise Plan bridging, water activites, sit<>stands, Tband-resisted R TKE, tandem gait; 04/01/16: added prone, quadruped right LE ex's, standing with band hip abdct and ext   Consulted and Agree with Plan of Care Patient      Patient will benefit from skilled therapeutic intervention in order to improve the following deficits and impairments:  Abnormal gait, Decreased activity tolerance, Decreased knowledge of precautions, Decreased endurance, Decreased strength, Impaired perceived functional ability, Impaired sensation, Improper body mechanics, Impaired UE functional use, Decreased coordination  Visit Diagnosis: Other abnormalities of gait and mobility  Unsteadiness on feet  Muscle weakness (generalized)  Hemiplegia and hemiparesis following cerebral infarction affecting right dominant side [facility])     Problem List Patient Active Problem List   Diagnosis Date Noted  . Acute CVA (cerebrovascular accident) ([place]) 01/05/2016  . Right sided weakness 01/05/2016  . Dyslipidemia associated with type 2 diabetes mellitus ([place]) 01/05/2016  . Hypokalemia 01/05/2016  . Type 2 diabetes mellitus with vascular disease ([place]) 10/21/2006    Willow Ora, PTA, [facility] [address] [place], [place] [address] [phone] 04/03/16, 3:11 PM   Name: Christian Clements MRN:  [id number] Date of Birth: 1951/02/01

## 2016-04-03 NOTE — Therapy (Signed)
[facility] [address] [place], [place], [address] Phone: [phone]   Fax:  [phone]  Occupational Therapy Treatment  Patient Details  Name: Christian Clements MRN: [id number] Date of Birth: [birth year] Referring Provider: Dr Rosalin Hawking  Encounter Date: 04/03/2016      OT End of Session - 04/03/16 1521    Visit Number 21   Number of Visits 24   Date for OT Re-Evaluation 05/03/16   Authorization Type [employer] [place] Medicare Every 10 visits   Authorization - Visit Number 11   Authorization - Number of Visits 20   OT Start Time 0930   OT Stop Time 1015   OT Time Calculation (min) 45 min   Activity Tolerance Patient tolerated treatment well      Past Medical History:  Diagnosis Date  . Diabetes mellitus   . Hypertension   . Prostatitis 2011    No past surgical history on file.  There were no vitals filed for this visit.      Subjective Assessment - 04/03/16 0933    Subjective  Just stiffness, no pain   Pertinent History see Epic   Currently in Pain? No/denies                      OT Treatments/Exercises (OP) - 04/03/16 0001      Fine Motor Coordination   Small Pegboard Pt manipulating 5 pegs at a time with mod drops to place in small pegboard with mod difficulty     Neurological Re-education Exercises   Other Exercises 1 BUE AROM high level sh. flexion with ball while seated, followed by performing high level sh. flex with weighted ball in standing in diagonal patterns   Other Exercises 2 BUE AA/ROM in high level sh. flexion with ball along wall, followed by high level scapula retraction lifting hand off ball (alternating). Progressed to mid level sh. flexion RUE only with ball on wall performing small circumduction motions with mod v.c's and tactile cues to prevent sh. IR and maintain sh. neutral.    Other Weight-Bearing Exercises 1 Plank position x 5 reps holding 5 sec.  Quadraped: alternating UE/LE lifts   Other Weight-Bearing Exercises 2 Seated: wt bearing through UE's in ext and ER while bridging off mat at pelvis                  OT Short Term Goals - 02/07/16 1031      OT SHORT TERM GOAL #1   Title Pt will be Mod I HEP for AROM and coordination RUE   Time 4   Period Weeks   Status Achieved     OT SHORT TERM GOAL #2   Title Pt will demonstrate improved grip strength by 5# or more RUE as seen by JAMAR geip assessment   Baseline RUE 55# at Eval; 79# 02/07/16   Time 4   Period Weeks   Status Achieved     OT SHORT TERM GOAL #3   Title Pt will be Mod I stating 3 possible signs/symptoms/risk factors of CVA   Time 4   Period Weeks   Status Achieved           OT Long Term Goals - 02/27/16 1133      OT LONG TERM GOAL #1   Title Pt will be Mod I upgraded HEP for RUE   Time 8   Period Weeks   Status Achieved     OT LONG  TERM GOAL #2   Title Pt will be Mod I simple meal/snack prep in ADL kitchen while maintaining safety   Time 8   Period Weeks   Status Achieved     OT LONG TERM GOAL #3   Title Pt will demonstrate AROM RUE WNL's w/o c/o joint tightness at end range for shoulder flexion (as compared to LUE)   Time 8   Period Weeks   Status On-going     OT LONG TERM GOAL #4   Title Pt will demonstrate improved MMT RUE of 4/5 or greater   Baseline MMT grossly 3/5 at eval on 01/09/16; MMT RUE 4-/5 02/20/16   Time 8   Period Weeks   Status On-going     OT LONG TERM GOAL #5   Title Pt will demonstrate improved grip strength RUE to 60# or greater RUE   Baseline See Eval 01/09/16; 02/20/16 = 75#   Time 8   Period Weeks   Status Achieved     Long Term Additional Goals   Additional Long Term Goals Yes     OT LONG TERM GOAL #6   Title Pt will demonstrate improved 9 Hole Peg test score R UE to less than 1 min for improved coordination & functional hand use   Baseline 01/09/16 1 min 43 seconds; see 02/25/16 results in note    Time 8   Period Weeks   Status Achieved               Plan - 04/03/16 1523    Clinical Impression Statement Pt progressing towards goals and anticipate d/c next week. (Re-cert completed today due to end date 04/02/16 - continue with current POC)    Rehab Potential Good   OT Frequency 2x / week   OT Duration 4 weeks   OT Treatment/Interventions Self-care/ADL training;Fluidtherapy;DME and/or AE instruction;Splinting;Patient/family education;Balance training;Therapeutic exercises;Therapeutic exercise;Therapeutic activities;Cognitive remediation/compensation;Functional Mobility Training;Neuromuscular education;Manual Therapy;Visual/perceptual remediation/compensation   Plan Anticipate d/c next week   Consulted and Agree with Plan of Care Patient      Patient will benefit from skilled therapeutic intervention in order to improve the following deficits and impairments:  Abnormal gait, Decreased coordination, Decreased range of motion, Impaired flexibility, Impaired sensation, Decreased activity tolerance, Cardiopulmonary status limiting activity, Decreased knowledge of precautions, Impaired UE functional use, Decreased knowledge of use of DME, Decreased balance, Decreased mobility, Decreased strength, Impaired vision/preception, Decreased cognition  Visit Diagnosis: Hemiplegia and hemiparesis following cerebral infarction affecting right dominant side ([place]) - Plan: Ot plan of care cert/re-cert  Other lack of coordination - Plan: Ot plan of care cert/re-cert  Muscle weakness (generalized) - Plan: Ot plan of care cert/re-cert      G-Codes - 0000000 1226    Functional Assessment Tool Used clinical judgement    Functional Limitation Carrying, moving and handling objects   Carrying, Moving and Handling Objects Current Status [phone]) At least 1 percent but less than 20 percent impaired, limited or restricted   Carrying, Moving and Handling Objects Goal Status [id number]) At least 1 percent but  less than 20 percent impaired, limited or restricted      Problem List Patient Active Problem List   Diagnosis Date Noted  . Acute CVA (cerebrovascular accident) ([place]) 01/05/2016  . Right sided weakness 01/05/2016  . Dyslipidemia associated with type 2 diabetes mellitus ([place]) 01/05/2016  . Hypokalemia 01/05/2016  . Type 2 diabetes mellitus with vascular disease ([place]) 10/21/2006    Carey Bullocks, OTR/L 04/03/2016, 3:28 PM  [place]  [facility] [address] [place], [place], [address] Phone: [phone]   Fax:  [phone]  Name: Levone Brecker MRN: [id number] Date of Birth: [birth year]

## 2016-04-08 ENCOUNTER — Telehealth: Payer: Self-pay | Admitting: *Deleted

## 2016-04-08 ENCOUNTER — Ambulatory Visit: Payer: Medicare Other | Admitting: Physical Therapy

## 2016-04-08 ENCOUNTER — Ambulatory Visit: Payer: Medicare Other | Admitting: Occupational Therapy

## 2016-04-08 DIAGNOSIS — I69351 Hemiplegia and hemiparesis following cerebral infarction affecting right dominant side: Secondary | ICD-10-CM

## 2016-04-08 DIAGNOSIS — R2681 Unsteadiness on feet: Secondary | ICD-10-CM

## 2016-04-08 DIAGNOSIS — R2689 Other abnormalities of gait and mobility: Secondary | ICD-10-CM

## 2016-04-08 DIAGNOSIS — M6281 Muscle weakness (generalized): Secondary | ICD-10-CM

## 2016-04-08 DIAGNOSIS — R278 Other lack of coordination: Secondary | ICD-10-CM

## 2016-04-08 DIAGNOSIS — I69318 Other symptoms and signs involving cognitive functions following cerebral infarction: Secondary | ICD-10-CM

## 2016-04-08 NOTE — Telephone Encounter (Signed)
LMVM cell # that cardiace event monitor showed no arrhythmia's, (sinus rhythm).  If questions return call.

## 2016-04-08 NOTE — Therapy (Signed)
[facility] [address] [place], [place], [address] Phone: [phone]   Fax:  [phone]  Occupational Therapy Treatment  Patient Details  Name: Christian Clements MRN: [id number] Date of Birth: 10-05-1950 Referring Provider: Dr Rosalin Hawking  Encounter Date: 04/08/2016      OT End of Session - 04/08/16 0919    Visit Number 22   Number of Visits 24   Date for OT Re-Evaluation 05/03/16   Authorization Type [employer] [place] Medicare Every 10 visits   Authorization - Visit Number 12   Authorization - Number of Visits 20   OT Start Time 0930   OT Stop Time 1015   OT Time Calculation (min) 45 min   Activity Tolerance Patient tolerated treatment well   Behavior During Therapy [facility] for tasks assessed/performed      Past Medical History:  Diagnosis Date  . Diabetes mellitus   . Hypertension   . Prostatitis 2011    No past surgical history on file.  There were no vitals filed for this visit.      Subjective Assessment - 04/08/16 0932    Subjective  Just stiffness, no pain.  "It's a lot stronger this week than last week"   Pertinent History see Epic   Currently in Pain? No/denies      In standing, AAROM shoulder flex with ball x10 for end range stretching followed by diagonals to each side x10 each.  In standing, wall slides for shoulder flex and abduction stretches followed by ER doorway stretch with min cues initially for positioning.  In prone, plank position for incr core/scapular stability with 10sec hold x5.    In quadraped, cat/cow positions for incr scapular/trunk mobility with 5sec hold x10 each.  Then, trunk rotations with UE reaching across and in abduction (each UE) x10 each for incr strength/flexibility.  Scapular retraction in prone with shoulders in flex, ext, and abduction x10 each.  In supine, AAROM BUEs with 2lb weighted ball in diagonals, chest press, and shoulder flex.  Arm  bike x27mn level 3 for conditioning/reciprocal movement (forward/backwards) without rest.                          OT Short Term Goals - 02/07/16 1031      OT SHORT TERM GOAL #1   Title Pt will be Mod I HEP for AROM and coordination RUE   Time 4   Period Weeks   Status Achieved     OT SHORT TERM GOAL #2   Title Pt will demonstrate improved grip strength by 5# or more RUE as seen by JAMAR geip assessment   Baseline RUE 55# at Eval; 79# 02/07/16   Time 4   Period Weeks   Status Achieved     OT SHORT TERM GOAL #3   Title Pt will be Mod I stating 3 possible signs/symptoms/risk factors of CVA   Time 4   Period Weeks   Status Achieved           OT Long Term Goals - 04/08/16 0949      OT LONG TERM GOAL #1   Title Pt will be Mod I upgraded HEP for RUE   Time 8   Period Weeks   Status Achieved     OT LONG TERM GOAL #2   Title Pt will be Mod I simple meal/snack prep in ADL kitchen while maintaining safety   Time 8   Period Weeks  Status Achieved     OT LONG TERM GOAL #3   Title Pt will demonstrate AROM RUE WNL's w/o c/o joint tightness at end range for shoulder flexion (as compared to LUE)   Time 8   Period Weeks   Status On-going  04/08/16:  reports stiffness only in the am (AROM WNL)     OT LONG TERM GOAL #4   Title Pt will demonstrate improved MMT RUE of 4/5 or greater   Baseline MMT grossly 3/5 at eval on 01/09/16; MMT RUE 4-/5 02/20/16   Time 8   Period Weeks   Status Achieved  met.  04/08/16     OT LONG TERM GOAL #5   Title Pt will demonstrate improved grip strength RUE to 60# or greater RUE   Baseline See Eval 01/09/16; 02/20/16 = 75#   Time 8   Period Weeks   Status Achieved     OT LONG TERM GOAL #6   Title Pt will demonstrate improved 9 Hole Peg test score R UE to less than 1 min for improved coordination & functional hand use   Baseline 01/09/16 1 min 43 seconds; see 02/25/16 results in note   Time 8   Period Weeks   Status  Achieved               Plan - 04/08/16 0920    Clinical Impression Statement Pt progressing well with decr stiffness noted.  Anticipate d/c next visit   Rehab Potential Good   OT Frequency 2x / week   OT Duration 4 weeks   OT Treatment/Interventions Self-care/ADL training;Fluidtherapy;DME and/or AE instruction;Splinting;Patient/family education;Balance training;Therapeutic exercises;Therapeutic exercise;Therapeutic activities;Cognitive remediation/compensation;Functional Mobility Training;Neuromuscular education;Manual Therapy;Visual/perceptual remediation/compensation   Plan Issue HEP for water; d/c next session (FOTO, G-code)   Consulted and Agree with Plan of Care Patient      Patient will benefit from skilled therapeutic intervention in order to improve the following deficits and impairments:  Abnormal gait, Decreased coordination, Decreased range of motion, Impaired flexibility, Impaired sensation, Decreased activity tolerance, Cardiopulmonary status limiting activity, Decreased knowledge of precautions, Impaired UE functional use, Decreased knowledge of use of DME, Decreased balance, Decreased mobility, Decreased strength, Impaired vision/preception, Decreased cognition  Visit Diagnosis: Hemiplegia and hemiparesis following cerebral infarction affecting right dominant side (HCC)  Other lack of coordination  Other symptoms and signs involving cognitive functions following cerebral infarction  Muscle weakness (generalized)  Unsteadiness on feet    Problem List Patient Active Problem List   Diagnosis Date Noted  . Acute CVA (cerebrovascular accident) ([place]) 01/05/2016  . Right sided weakness 01/05/2016  . Dyslipidemia associated with type 2 diabetes mellitus ([place]) 01/05/2016  . Hypokalemia 01/05/2016  . Type 2 diabetes mellitus with vascular disease ([place]) 10/21/2006    [facility] 04/08/2016, 10:14 AM  [facility] [address] [place], [place], [address] Phone: [phone]   Fax:  [phone]  Name: Christian Clements MRN: [id number] Date of Birth: [birth year]   Vianne Bulls, OTR/L [facility] [address] [place], [place]  [address] [phone] phone [phone] 04/08/16 10:14 AM

## 2016-04-08 NOTE — Patient Instructions (Addendum)
Breaststroke With Upper Extremities With [facility] in vertical position, legs together. Bring arms forward then out and backward in breaststroke motion to move forward. Perform 1 laps.  Copyright  VHI. All rights reserved.  Clasped-Hand Diagonal Chop With Squat    Standing with legs apart, clasp hands together, arms overhead. Chop arms toward outside of left leg while squatting. Return to start position. Chop to outside of right leg. Perform 10-15 reps.    Scapula Stabilization Against Pool Wall for Shoulder Extension    Squat with back against wall, arms at sides. Push elbows and hands into wall while squeezing shoulder blades together. Hold 5 seconds. Perform 10-15 reps.    Upper Extremity Pattern D1    Stand with arm out from side, thumb down. Move arm up and inward across body, turning palm up to finish. Reverse sequence returning to start position. Perform 10-15 reps.  Copyright  VHI. All rights reserved.  Upper Extremity Pattern D2    Stand with arm across body at hips, palm facing hip. Move arm up and outward away from body, turning thumb up to finish. Reverse sequence returning to start position. Perform 10-15 reps.   Walking With [employer]    Walking forward, move arms and legs in opposite directions as if cross-country skiing. Perform 1-2 laps.   Copyright  VHI. All rights reserved.

## 2016-04-08 NOTE — Telephone Encounter (Signed)
-----   Message from Dennie Bible, NP sent at 04/07/2016 12:02 PM EST ----- Sinus rhythm No arrhythmias on 30 day event monitor please call

## 2016-04-09 NOTE — Therapy (Addendum)
[facility] [address] [place], [place], [address] Phone: [phone]   Fax:  [phone]  Physical Therapy Treatment  Patient Details  Name: Christian Clements MRN: [id number] Date of Birth: 02-09-1951 Referring Provider: Rosalin Hawking, MD  Encounter Date: 04/08/2016      PT End of Session - 04/09/16 0916    Visit Number 19   Number of Visits 25   Date for PT Re-Evaluation 04/26/16   Authorization Type [employer] Medicare   Authorization Time Period G-code every 10th visit; 03-27-16 - 05-25-16   PT Start Time 0845   PT Stop Time 0930   PT Time Calculation (min) 45 min      Past Medical History:  Diagnosis Date  . Diabetes mellitus   . Hypertension   . Prostatitis 2011    No past surgical history on file.  There were no vitals filed for this visit.      Subjective Assessment - 04/08/16 0848    Subjective Pt reports he aggravated an old Rt calf injury by jogging 2 - 3 minutes on treadmill on Sunday - did stretching before and after treadmill and then got in sauna after treadmill to ease the discomfort   Pertinent History Likes to be called "[name]".  PMH significant: HTN and DM II,   Patient Stated Goals "I want to be able to move around and do things I did before I had this stroke. I like to golf and I work out 5-6 times per week."   Currently in Pain? No/denies                         [facility] Adult PT Treatment/Exercise - 04/09/16 0001      Neuro Re-ed    Neuro Re-ed Details  Pt performed amb. forward and backward approx. 30' on tiptoes with knees flexed for hamstring and gastroc strengthening; also sideways amb. on tiptoes with knees flexed 30' x 2 reps     Knee/Hip Exercises: Standing   Heel Raises Both;1 set;10 reps  RLE only 10 reps   Forward Step Up Right;1 set;10 reps;Hand Hold: 1  minimal UE support   Step Down Right;1 set;10 reps;Hand Hold: 1  minimal UE support   Functional Squat 1  set;10 reps  standing on Bosu inside // bars     Knee/Hip Exercises: Prone   Hamstring Curl 10 reps;3 sets  5#   Other Prone Exercises R hip extension with knee flexed 2 sets 10 reps     Self care; discussed water exercises - such as jogging, 1/2 jumping jacks and lunges to practice high level balance activities without strain on R calf  Muscles - will get pictures for aquatic exercises to give at next session   Pt performed R hip extension with knee flexed at 90 degrees - small extension AROM in quadruped position 10 reps Performed Rt hip flexion and extension in quadruped position 10 reps            PT Short Term Goals - 02/06/16 0827      PT SHORT TERM GOAL #1   Title Pt will be independent and verbalize understanding of initial HEP to continue progress made in therapy. (Target Date for all STGs: 02/07/16)   Baseline Met 11/15.   Time 4   Period Weeks   Status Achieved     PT SHORT TERM GOAL #2   Title Pt will improve FGA to 18/30 to indicate improved  functional gait and mobility.   Baseline 11/15: FGA = 22/30   Time 4   Period Weeks   Status Achieved     PT SHORT TERM GOAL #3   Title Pt will improve 5 times sit to stand to < or = 12 sec indicating a decr risk of falls.   Baseline 11/15: 5x STS = 8.17 seconds   Time 4   Period Weeks   Status Achieved     PT SHORT TERM GOAL #4   Title Pt will negotiate 8 stairs with supervision and no UE support to incr safety negotiating stairs at home.   Baseline Met 11/15.   Time 4   Period Weeks   Status Achieved     PT SHORT TERM GOAL #5   Title Pt will ambulate 500' with mod I over level surfaces, ramps, and curbs to incr safey ambulating at home.   Baseline Met 11/15.   Time 4   Period Weeks   Status Achieved           PT Long Term Goals - 03/27/16 1041      PT LONG TERM GOAL #1   Title Pt will be independent and verbalize understanding of updated HEP and on-going fitness plan to maintain progress made in  therapy and his active lifestyle. (Target Date for all LTGs: 04-27-16)   Status Revised     PT LONG TERM GOAL #2   Title Pt will improve FGA score to > or = 23/30 to indicate decr fall risk and improved functional mobility. UPGRADE to 30/30 - target date 04-27-16   Baseline 03/04/16: 26/30 scored today   Time 4   Period Weeks   Status Revised     PT LONG TERM GOAL #3   Title Pt will improve gait speed to > 2.62 ft/sec to indicate status of community ambulator. upgrade to >/= 3.9 ft/sec - target date 04-27-16   Baseline 03/04/16: 3.45 ft/sec today with no AD   Time 4   Period Weeks   Status Revised     PT LONG TERM GOAL #4   Title Pt will negotiate 15 stairs with mod I and no UE support to improve safety with stairs at home.  04-27-16   Baseline 03/04/16: negotiated 16 stairs reciprocally with supervision due to knee instability/occasional recurvatum   Status On-going     PT LONG TERM GOAL #5   Title Jog 360' modified independently with no occurrence of R genu recurvatum.   Baseline 40" x3 with short rest breaks   Time 4   Period Weeks   Status New     Additional Long Term Goals   Additional Long Term Goals Yes     PT LONG TERM GOAL #6   Title Pt will increase strength in RLE so that he is able to hop on R leg only 10 x without UE support and no LOB. 04-27-16   Baseline pt able to hop 5 reps with LOB - performed inside bars with UE support prn   Time 4   Period Weeks   Status New               Plan - 04/09/16 0917    Clinical Impression Statement Pt's activity tolerance limited today by c/o R calf discomfort due to previous old injury which pt aggravated by jogging on treadmill on Sunday, 04-06-16;  high level balance/gait activities of jumping, hopping and jogging not performed in therapy today due to c/o R calf  discomfort;  pt states he is ready for D/C next session - is very pleased with progress and feels that he is able to continue exercise program at gym with addition of  aquatic exercise                                                                                                                                                                  Rehab Potential Excellent   PT Frequency 2x / week   PT Duration 4 weeks   PT Treatment/Interventions ADLs/Self Care Home Management;Functional mobility training;Stair training;Gait training;Therapeutic activities;Therapeutic exercise;Balance training;Neuromuscular re-education;Patient/family education;Manual techniques;Energy conservation   PT Next Visit Plan check LTG's - D/C - provide pictures of exercises for aquatic exercise in pool   PT Home Exercise Plan bridging, water activites, sit<>stands, Tband-resisted R TKE, tandem gait; 04/01/16: added prone, quadruped right LE ex's, standing with band hip abdct and ext   Consulted and Agree with Plan of Care Patient      Patient will benefit from skilled therapeutic intervention in order to improve the following deficits and impairments:  Abnormal gait, Decreased activity tolerance, Decreased knowledge of precautions, Decreased endurance, Decreased strength, Impaired perceived functional ability, Impaired sensation, Improper body mechanics, Impaired UE functional use, Decreased coordination  Visit Diagnosis: Muscle weakness (generalized)  Other abnormalities of gait and mobility     Problem List Patient Active Problem List   Diagnosis Date Noted  . Acute CVA (cerebrovascular accident) ([place]) 01/05/2016  . Right sided weakness 01/05/2016  . Dyslipidemia associated with type 2 diabetes mellitus ([place]) 01/05/2016  . Hypokalemia 01/05/2016  . Type 2 diabetes mellitus with vascular disease ([place]) 10/21/2006    Andrell Bergeson, [place], PT 04/09/2016, 9:31 AM  [facility] [address] [place], [place], [address] Phone: [phone]   Fax:  [phone]  Name: Christian Clements MRN: [id number] Date of Birth:  11/15/1950   PHYSICAL THERAPY DISCHARGE SUMMARY  Visits from Start of Care:19   Plan: Patient agrees to discharge.  Patient goals were met. Patient is being discharged due to meeting the stated rehab goals.  ?????    It appears pt did not return for his final visit, but was doing well as last visit.    Lyndee Hensen, PT, DPT 11:41 AM  09/06/18

## 2016-04-10 ENCOUNTER — Encounter: Payer: Medicare Other | Admitting: Occupational Therapy

## 2016-04-10 ENCOUNTER — Ambulatory Visit: Payer: Medicare Other | Admitting: Physical Therapy

## 2016-04-21 ENCOUNTER — Encounter: Payer: Self-pay | Admitting: Occupational Therapy

## 2016-04-21 NOTE — Therapy (Signed)
[facility] [address] [place], [place], [address] Phone: [phone]   Fax:  [phone]  Patient Details  Name: Christian Clements MRN: [id number] Date of Birth: [birth year] Referring Provider:  No ref. provider found  Encounter Date: 04/21/2016   OCCUPATIONAL THERAPY DISCHARGE SUMMARY  Visits from Start of Care: 22  Current functional level related to goals / functional outcomes:     OT Short Term Goals - 02/07/16 1031      OT SHORT TERM GOAL #1   Title Pt will be Mod I HEP for AROM and coordination RUE   Time 4   Period Weeks   Status Achieved     OT SHORT TERM GOAL #2   Title Pt will demonstrate improved grip strength by 5# or more RUE as seen by JAMAR geip assessment   Baseline RUE 55# at Eval; 79# 02/07/16   Time 4   Period Weeks   Status Achieved     OT SHORT TERM GOAL #3   Title Pt will be Mod I stating 3 possible signs/symptoms/risk factors of CVA   Time 4   Period Weeks   Status Achieved         OT Long Term Goals - 04/08/16 0949      OT LONG TERM GOAL #1   Title Pt will be Mod I upgraded HEP for RUE   Time 8   Period Weeks   Status Achieved     OT LONG TERM GOAL #2   Title Pt will be Mod I simple meal/snack prep in ADL kitchen while maintaining safety   Time 8   Period Weeks   Status Achieved     OT LONG TERM GOAL #3   Title Pt will demonstrate AROM RUE WNL's w/o c/o joint tightness at end range for shoulder flexion (as compared to LUE)   Time 8   Period Weeks   Status Met  04/08/16:  reports stiffness only in the am (AROM WNL)     OT LONG TERM GOAL #4   Title Pt will demonstrate improved MMT RUE of 4/5 or greater   Baseline MMT grossly 3/5 at eval on 01/09/16; MMT RUE 4-/5 02/20/16   Time 8   Period Weeks   Status Achieved  met.  04/08/16     OT LONG TERM GOAL #5   Title Pt will demonstrate improved grip strength RUE to 60# or greater RUE   Baseline See Eval 01/09/16;  02/20/16 = 75#   Time 8   Period Weeks   Status Achieved     OT LONG TERM GOAL #6   Title Pt will demonstrate improved 9 Hole Peg test score R UE to less than 1 min for improved coordination & functional hand use   Baseline 01/09/16 1 min 43 seconds; see 02/25/16 results in note   Time 8   Period Weeks   Status Achieved        Remaining deficits: Pt with decr strength RUE and mild decr coordination.  Pt also with mild stiffness in RUE (ROM WNL).--all improved    Education / Equipment: Pt instructed in HEP and CVA education.  Pt verbalized understanding of all education provided.  Plan: Patient agrees to discharge.  Patient goals were met. Patient is being discharged due to meeting the stated rehab goals.  Pt was scheduled for 1 remaining appt to issue water HEP, but appt was cancelled due to inclement weather and pt declined to reschedule.  Therefore,  will be discharged at this time.?????       [facility] 04/21/2016, 10:07 AM  [facility] [address] [place], [place], [address] Phone: [phone]   Fax:  [place], OTR/L [facility] [address] [place], [place]  [address] [phone] phone [phone] 04/21/16 10:10 AM

## 2016-05-03 ENCOUNTER — Other Ambulatory Visit: Payer: Self-pay | Admitting: Internal Medicine

## 2016-05-15 ENCOUNTER — Other Ambulatory Visit (INDEPENDENT_AMBULATORY_CARE_PROVIDER_SITE_OTHER): Payer: Medicare Other

## 2016-05-15 ENCOUNTER — Telehealth: Payer: Self-pay

## 2016-05-15 DIAGNOSIS — Z0000 Encounter for general adult medical examination without abnormal findings: Secondary | ICD-10-CM | POA: Diagnosis not present

## 2016-05-15 LAB — LIPID PANEL
CHOL/HDL RATIO: 3
Cholesterol: 83 mg/dL (ref 0–200)
HDL: 30.9 mg/dL — AB (ref >39.00)
LDL Cholesterol: 41 mg/dL (ref 0–99)
NONHDL: 52.26
Triglycerides: 55 mg/dL (ref 0.0–149.0)
VLDL: 11 mg/dL (ref 0.0–40.0)

## 2016-05-15 LAB — COMPREHENSIVE METABOLIC PANEL
ALK PHOS: 46 U/L (ref 39–117)
ALT: 19 U/L (ref 0–53)
AST: 15 U/L (ref 0–37)
Albumin: 4.4 g/dL (ref 3.5–5.2)
BILIRUBIN TOTAL: 0.7 mg/dL (ref 0.2–1.2)
BUN: 14 mg/dL (ref 6–23)
CO2: 26 mEq/L (ref 19–32)
CREATININE: 0.96 mg/dL (ref 0.40–1.50)
Calcium: 9 mg/dL (ref 8.4–10.5)
Chloride: 104 mEq/L (ref 96–112)
GFR: 100.73 mL/min (ref >60.00)
GLUCOSE: 118 mg/dL — AB (ref 70–99)
Potassium: 3.8 mEq/L (ref 3.5–5.1)
SODIUM: 137 meq/L (ref 135–145)
TOTAL PROTEIN: 7 g/dL (ref 6.0–8.3)

## 2016-05-15 LAB — CBC WITH DIFFERENTIAL/PLATELET
BASOS ABS: 0 10*3/uL (ref 0.0–0.1)
Basophils Relative: 0.6 % (ref 0.0–3.0)
Eosinophils Absolute: 0.6 10*3/uL (ref 0.0–0.7)
Eosinophils Relative: 6.6 % — ABNORMAL HIGH (ref 0.0–5.0)
HCT: 42.8 % (ref 39.0–52.0)
Hemoglobin: 14.6 g/dL (ref 13.0–17.0)
LYMPHS ABS: 3.4 10*3/uL (ref 0.7–4.0)
Lymphocytes Relative: 40.9 % (ref 12.0–46.0)
MCHC: 34.2 g/dL (ref 30.0–36.0)
MCV: 91.7 fl (ref 78.0–100.0)
MONO ABS: 0.5 10*3/uL (ref 0.1–1.0)
MONOS PCT: 5.5 % (ref 3.0–12.0)
NEUTROS PCT: 46.4 % (ref 43.0–77.0)
Neutro Abs: 3.9 10*3/uL (ref 1.4–7.7)
Platelets: 296 10*3/uL (ref 150.0–400.0)
RBC: 4.66 Mil/uL (ref 4.22–5.81)
RDW: 14 % (ref 11.5–15.5)
WBC: 8.3 10*3/uL (ref 4.0–10.5)

## 2016-05-15 LAB — BASIC METABOLIC PANEL
BUN: 14 mg/dL (ref 6–23)
CALCIUM: 9 mg/dL (ref 8.4–10.5)
CO2: 26 mEq/L (ref 19–32)
CREATININE: 0.96 mg/dL (ref 0.40–1.50)
Chloride: 104 mEq/L (ref 96–112)
GFR: 100.73 mL/min (ref >60.00)
Glucose, Bld: 118 mg/dL — ABNORMAL HIGH (ref 70–99)
Potassium: 3.8 mEq/L (ref 3.5–5.1)
Sodium: 137 mEq/L (ref 135–145)

## 2016-05-15 LAB — HEMOGLOBIN A1C: Hgb A1c MFr Bld: 6.6 % — ABNORMAL HIGH (ref 4.6–6.5)

## 2016-05-15 LAB — PROTIME-INR
INR: 1.2 ratio — AB (ref 0.8–1.0)
PROTHROMBIN TIME: 12.4 s (ref 9.6–13.1)

## 2016-05-15 NOTE — Telephone Encounter (Signed)
Lab orders placed for patient.

## 2016-05-16 ENCOUNTER — Ambulatory Visit (INDEPENDENT_AMBULATORY_CARE_PROVIDER_SITE_OTHER): Payer: Medicare Other | Admitting: Internal Medicine

## 2016-05-16 ENCOUNTER — Encounter: Payer: Self-pay | Admitting: Internal Medicine

## 2016-05-16 DIAGNOSIS — R350 Frequency of micturition: Secondary | ICD-10-CM | POA: Diagnosis not present

## 2016-05-16 DIAGNOSIS — E1169 Type 2 diabetes mellitus with other specified complication: Secondary | ICD-10-CM

## 2016-05-16 DIAGNOSIS — I639 Cerebral infarction, unspecified: Secondary | ICD-10-CM | POA: Diagnosis not present

## 2016-05-16 DIAGNOSIS — I10 Essential (primary) hypertension: Secondary | ICD-10-CM | POA: Diagnosis not present

## 2016-05-16 DIAGNOSIS — K5903 Drug induced constipation: Secondary | ICD-10-CM | POA: Diagnosis not present

## 2016-05-16 DIAGNOSIS — E1159 Type 2 diabetes mellitus with other circulatory complications: Secondary | ICD-10-CM | POA: Diagnosis not present

## 2016-05-16 DIAGNOSIS — E785 Hyperlipidemia, unspecified: Secondary | ICD-10-CM | POA: Diagnosis not present

## 2016-05-16 DIAGNOSIS — N401 Enlarged prostate with lower urinary tract symptoms: Secondary | ICD-10-CM

## 2016-05-16 DIAGNOSIS — N400 Enlarged prostate without lower urinary tract symptoms: Secondary | ICD-10-CM | POA: Insufficient documentation

## 2016-05-16 DIAGNOSIS — K5900 Constipation, unspecified: Secondary | ICD-10-CM | POA: Insufficient documentation

## 2016-05-16 DIAGNOSIS — I152 Hypertension secondary to endocrine disorders: Secondary | ICD-10-CM | POA: Insufficient documentation

## 2016-05-16 MED ORDER — POLYETHYLENE GLYCOL 3350 17 GM/SCOOP PO POWD: 17.0000 g | Freq: Two times a day (BID) | ORAL | 11 refills | Status: DC | PRN

## 2016-05-16 MED ORDER — DOXAZOSIN MESYLATE 2 MG PO TABS: 2.0000 mg | ORAL_TABLET | Freq: Every day | ORAL | 11 refills | Status: DC

## 2016-05-16 MED ORDER — B COMPLEX PO TABS: 1.0000 | ORAL_TABLET | Freq: Every day | ORAL | 3 refills | Status: AC

## 2016-05-16 NOTE — Assessment & Plan Note (Signed)
Actos, Metformin, Trajenta

## 2016-05-16 NOTE — Assessment & Plan Note (Signed)
Miralax Linzess if not better

## 2016-05-16 NOTE — Assessment & Plan Note (Signed)
Added Crardura

## 2016-05-16 NOTE — Assessment & Plan Note (Signed)
ASA Lipitor and accuretic

## 2016-05-16 NOTE — Assessment & Plan Note (Signed)
Start Cardura

## 2016-05-16 NOTE — Progress Notes (Signed)
Subjective:  Patient ID: Christian Clements, male    DOB: 04-Jun-1950  Age: 66 y.o. MRN: [id number]  CC: Follow-up (CVA, stroke-stable)   HPI [name] presents for CVA, dyslipidemia, DM f/u. C/o  Frequent urination and constipation following the stroke. C/o short term memory loss. Finished PT,OT  Outpatient Medications Prior to Visit  Medication Sig Dispense Refill  . aspirin 325 MG tablet Take 1 tablet (325 mg total) by mouth daily. 100 tablet 3  . atorvastatin (LIPITOR) 20 MG tablet TAKE 1 TABLET (20 MG TOTAL) BY MOUTH DAILY. 90 tablet 1  . blood glucose meter kit and supplies KIT Dispense based on patient and insurance preference. Use two times daily as directed. (FOR ICD-9 250.00, 250.01). 1 each 0  . Cholecalciferol 1000 UNITS tablet Take 1,000 Units by mouth daily.      Marland Kitchen glucose blood (ONETOUCH VERIO) test strip Use to check blood sugars twice a day Dx E11.9 Yearly physical w/labs are due must see MD for refills 100 each 0  . linagliptin (TRADJENTA) 5 MG TABS tablet Take 1 tablet (5 mg total) by mouth daily. 30 tablet 11  . metFORMIN (GLUCOPHAGE) 1000 MG tablet TAKE 1 TABLET (1,000 MG TOTAL) BY MOUTH 2 (TWO) TIMES DAILY WITH A MEAL. 180 tablet 3  . PROBIOTIC CAPS Take 1 capsule by mouth daily.      . quinapril-hydrochlorothiazide (ACCURETIC) 20-12.5 MG tablet TAKE 2 TABLETS BY MOUTH DAILY. 180 tablet 0  . sildenafil (VIAGRA) 100 MG tablet Take 1 tablet (100 mg total) by mouth daily as needed. 10 tablet 11  . triamcinolone ointment (KENALOG) 0.5 % Apply 1 application topically 2 (two) times daily. 30 g 1   No facility-administered medications prior to visit.     ROS Review of Systems  Constitutional: Negative for appetite change, fatigue and unexpected weight change.  HENT: Negative for congestion, nosebleeds, sneezing, sore throat and trouble swallowing.   Eyes: Negative for itching and visual disturbance.  Respiratory: Negative for cough.   Cardiovascular: Negative for  chest pain, palpitations and leg swelling.  Gastrointestinal: Positive for constipation. Negative for abdominal distention, blood in stool, diarrhea and nausea.  Genitourinary: Positive for frequency. Negative for hematuria.  Musculoskeletal: Negative for back pain, gait problem, joint swelling and neck pain.  Skin: Negative for rash.  Neurological: Positive for weakness. Negative for dizziness, tremors and speech difficulty.  Psychiatric/Behavioral: Negative for agitation, dysphoric mood and sleep disturbance. The patient is not nervous/anxious.     Objective:  BP (!) 150/92   Pulse 70   Temp 97.9 F (36.6 C) (Oral)   Resp 16   Ht _0  (1.778 m)   Wt 215 lb 8 oz (97.8 kg)   SpO2 98%   BMI 30.92 kg/m   BP Readings from Last 3 Encounters:  05/16/16 (!) 150/92  03/04/16 128/84  02/18/16 126/89    Wt Readings from Last 3 Encounters:  05/16/16 215 lb 8 oz (97.8 kg)  03/04/16 217 lb (98.4 kg)  02/18/16 215 lb 12.8 oz (97.9 kg)    Physical Exam  Constitutional: He is oriented to person, place, and time. He appears well-developed. No distress.  NAD  HENT:  Mouth/Throat: Oropharynx is clear and moist.  Eyes: Conjunctivae are normal. Pupils are equal, round, and reactive to light.  Neck: Normal range of motion. No JVD present. No thyromegaly present.  Cardiovascular: Normal rate, regular rhythm, normal heart sounds and intact distal pulses.  Exam reveals no gallop and no friction rub.  No murmur heard. Pulmonary/Chest: Effort normal and breath sounds normal. No respiratory distress. He has no wheezes. He has no rales. He exhibits no tenderness.  Abdominal: Soft. Bowel sounds are normal. He exhibits no distension and no mass. There is no tenderness. There is no rebound and no guarding.  Musculoskeletal: Normal range of motion. He exhibits no edema or tenderness.  Lymphadenopathy:    He has no cervical adenopathy.  Neurological: He is alert and oriented to person, place, and  time. He has normal reflexes. No cranial nerve deficit. He exhibits normal muscle tone. He displays a negative Romberg sign. Coordination and gait normal.  Skin: Skin is warm and dry. No rash noted.  Psychiatric: He has a normal mood and affect. His behavior is normal. Judgment and thought content normal.  R grip is weaker  Lab Results  Component Value Date   WBC 8.3 05/15/2016   HGB 14.6 05/15/2016   HCT 42.8 05/15/2016   PLT 296.0 05/15/2016   GLUCOSE 118 (H) 05/15/2016   GLUCOSE 118 (H) 05/15/2016   CHOL 83 05/15/2016   TRIG 55.0 05/15/2016   HDL 30.90 (L) 05/15/2016   LDLDIRECT 30.1 11/14/2010   LDLCALC 41 05/15/2016   ALT 19 05/15/2016   AST 15 05/15/2016   NA 137 05/15/2016   NA 137 05/15/2016   K 3.8 05/15/2016   K 3.8 05/15/2016   CL 104 05/15/2016   CL 104 05/15/2016   CREATININE 0.96 05/15/2016   CREATININE 0.96 05/15/2016   BUN 14 05/15/2016   BUN 14 05/15/2016   CO2 26 05/15/2016   CO2 26 05/15/2016   TSH 1.67 11/08/2012   PSA 0.32 11/14/2010   INR 1.2 (H) 05/15/2016   HGBA1C 6.6 (H) 05/15/2016    Ct Head Wo Contrast  Result Date: 01/04/2016 CLINICAL DATA:  Right upper extremity numbness and unsteady gait. History of hypertension. Diabetes. EXAM: CT HEAD WITHOUT CONTRAST TECHNIQUE: Contiguous axial images were obtained from the base of the skull through the vertex without intravenous contrast. COMPARISON:  None. FINDINGS: Brain: Mild low density in the periventricular white matter likely related to small vessel disease. No mass lesion, hemorrhage, hydrocephalus, acute infarct, intra-axial, or extra-axial fluid collection. Vascular: No hyperdense vessel or unexpected calcification. Skull: Normal Sinuses/Orbits: Normal orbits and globes. Right ethmoid air cell mucous retention cyst or polyp. Clear mastoid air cells. Other: None IMPRESSION: 1.  No acute intracranial abnormality. 2. Mild small vessel ischemic change. Electronically Signed   By: Abigail Miyamoto M.D.   On:  01/04/2016 13:12   Mr Brain Wo Contrast  Result Date: 01/04/2016 CLINICAL DATA:  Acute onset of numbness of the right hand and fingers while exercising. EXAM: MRI HEAD WITHOUT CONTRAST MRA HEAD WITHOUT CONTRAST TECHNIQUE: Multiplanar, multiecho pulse sequences of the brain and surrounding structures were obtained without intravenous contrast. Angiographic images of the head were obtained using MRA technique without contrast. COMPARISON:  CT same day FINDINGS: MRI HEAD FINDINGS Brain: Diffusion imaging shows a sub cm acute infarction at the pontomedullary junction anteriorly just to the left of midline. There is also a small linear white matter acute infarction in the left frontal subcortical white matter. There are mild chronic small-vessel changes of the pons. There are moderate chronic small-vessel ischemic changes of the cerebral hemispheric white matter. No large vessel territory infarction. No mass lesion, hemorrhage, hydrocephalus or extra-axial collection. Vascular: Major vessels at the base of the brain show flow. Skull and upper cervical spine: Negative Sinuses/Orbits: Clear Other: None significant MRA  HEAD FINDINGS Both internal carotid arteries are widely patent into the brain. The anterior and middle cerebral vessels are normal without proximal stenosis, aneurysm or vascular malformation. Fetal origin of the left PCA. Large posterior communicating artery on the right. Both vertebral arteries are patent. The right terminates in PICA. Left vertebral artery supplies the basilar. No basilar stenosis. Posterior circulation branch vessels are patent and normal. 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 Small acute left frontal subcortical white matter infarction. These 2 acute infarctions could be coincidental in due to ordinary small vessel disease, but emboli from the heart or ascending aorta could also result in this. Moderate chronic  small-vessel ischemic changes of the cerebral hemispheric white matter for a age. Negative intracranial MR angiography of the large and medium size vessels. Electronically Signed   By: Nelson Chimes M.D.   On: 01/04/2016 17:41   Mr Jodene Nam Head/brain SU Cm  Result Date: 01/04/2016 CLINICAL DATA:  Acute onset of numbness of the right hand and fingers while exercising. EXAM: MRI HEAD WITHOUT CONTRAST MRA HEAD WITHOUT CONTRAST TECHNIQUE: Multiplanar, multiecho pulse sequences of the brain and surrounding structures were obtained without intravenous contrast. Angiographic images of the head were obtained using MRA technique without contrast. COMPARISON:  CT same day FINDINGS: MRI HEAD FINDINGS Brain: Diffusion imaging shows a sub cm acute infarction at the pontomedullary junction anteriorly just to the left of midline. There is also a small linear white matter acute infarction in the left frontal subcortical white matter. There are mild chronic small-vessel changes of the pons. There are moderate chronic small-vessel ischemic changes of the cerebral hemispheric white matter. No large vessel territory infarction. No mass lesion, hemorrhage, hydrocephalus or extra-axial collection. Vascular: Major vessels at the base of the brain show flow. Skull and upper cervical spine: Negative Sinuses/Orbits: Clear Other: None significant MRA HEAD FINDINGS Both internal carotid arteries are widely patent into the brain. The anterior and middle cerebral vessels are normal without proximal stenosis, aneurysm or vascular malformation. Fetal origin of the left PCA. Large posterior communicating artery on the right. Both vertebral arteries are patent. The right terminates in PICA. Left vertebral artery supplies the basilar. No basilar stenosis. Posterior circulation branch vessels are patent and normal. 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 Small acute left frontal  subcortical white matter infarction. These 2 acute infarctions could be coincidental in due to ordinary small vessel disease, but emboli from the heart or ascending aorta could also result in this. Moderate chronic small-vessel ischemic changes of the cerebral hemispheric white matter for a age. Negative intracranial MR angiography of the large and medium size vessels. Electronically Signed   By: Nelson Chimes M.D.   On: 01/04/2016 17:41    Assessment & Plan:   There are no diagnoses linked to this encounter. I am having Mr. Trulock maintain his Probiotic, Cholecalciferol, sildenafil, triamcinolone ointment, blood glucose meter kit and supplies, linagliptin, aspirin, glucose blood, metFORMIN, atorvastatin, and quinapril-hydrochlorothiazide.  No orders of the defined types were placed in this encounter.    Follow-up: No Follow-up on file.  Walker Kehr, MD

## 2016-05-16 NOTE — Progress Notes (Signed)
Pre-visit discussion using our clinic review tool. No additional management support is needed unless otherwise documented below in the visit note.  

## 2016-05-16 NOTE — Assessment & Plan Note (Signed)
Lipitor 

## 2016-05-20 ENCOUNTER — Ambulatory Visit (INDEPENDENT_AMBULATORY_CARE_PROVIDER_SITE_OTHER): Payer: Medicare Other | Admitting: Nurse Practitioner

## 2016-05-20 ENCOUNTER — Encounter: Payer: Self-pay | Admitting: Nurse Practitioner

## 2016-05-20 VITALS — BP 135/79 | HR 77 | Ht 70.0 in | Wt 217.4 lb

## 2016-05-20 DIAGNOSIS — I10 Essential (primary) hypertension: Secondary | ICD-10-CM

## 2016-05-20 DIAGNOSIS — I639 Cerebral infarction, unspecified: Secondary | ICD-10-CM | POA: Diagnosis not present

## 2016-05-20 DIAGNOSIS — E785 Hyperlipidemia, unspecified: Secondary | ICD-10-CM

## 2016-05-20 DIAGNOSIS — E1159 Type 2 diabetes mellitus with other circulatory complications: Secondary | ICD-10-CM

## 2016-05-20 DIAGNOSIS — E1169 Type 2 diabetes mellitus with other specified complication: Secondary | ICD-10-CM | POA: Diagnosis not present

## 2016-05-20 NOTE — Patient Instructions (Signed)
Continue  aspirin for secondary stroke prevention Maintain strict control of hypertension with blood pressure goal below 130/90, today's reading 135/79 continue antihypertensive medications Control of diabetes with hemoglobin A1c below 6.5 most recent hemoglobin A1c 6.6 continue diabetic medications Cholesterol with LDL cholesterol less than 70, continue statin drug Lipitor Continue working out at [employer], playing golf  eat healthy diet with whole grains,  fresh fruits and vegetables cardiac event monitoring with no events Follow-up in 6 months

## 2016-05-20 NOTE — Progress Notes (Signed)
[facility]  PATIENT: Christian Clements DOB: 12-20-1950   REASON FOR VISIT:  follow-up for stroke HISTORY FROM: Patient alone at visit   HISTORY OF PRESENT ILLNESS:UPDATE 02/27/2018CM Christian Clements, 66 year old male returns for follow-up. He has a history of stroke event that occurred in October  2017. He is currently on aspirin 325 mg daily for secondary stroke prevention without further stroke or TIA symptoms. He denies bruising or bleeding. In addition he is on Lipitor without complaints of myalgias. Hemoglobin A1c improved to 6.6 on 05/15/2016 from 9.1 on hospital admission. His physical therapy concluded in January and now he is working out at gym about 5-6 days per week. He is trying to play golf. 30 day cardiac event monitoring did not show any events, this was to rule out atrial fibrillation. He returns for reevaluation    HISTORY 02/18/16 CM 66 y.o.malewith past medical history of diabetes, hypertension who presented to [facility] for evaluation of right arm numbness and tingling and right foot drop started 2-3 days prior to this admission. 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 MRA of the brain was negative. Carotid Dopplers unremarkable. 2-D echo EF 55%. Hemoglobin A1c 9.1. LDL 27 he was placed on aspirin 325 daily, no anti-thrombotic prior to admission. He returns to the stroke clinic today for follow-up from his hospitalization. He continues to get OT and physical therapy. He has not had recurrent stroke or TIA symptoms. He does not have any bruising or bleeding from aspirin He needs to be scheduled for 30 day cardiac event monitoring to rule out atrial fibrillation. He has been placed on Tradjenta with better control of his blood sugars. He returns for reevaluation   REVIEW OF SYSTEMS: Full 14 system review of systems  performed and notable only for those listed, all others are neg:  Constitutional: neg  Cardiovascular: neg Ear/Nose/Throat: neg  Skin: neg Eyes: neg Respiratory: neg Gastroitestinal: neg  Hematology/Lymphatic: neg  Endocrine: neg Musculoskeletal:neg Allergy/Immunology: neg Neurological: neg Psychiatric: neg Sleep : neg   ALLERGIES: No Known Allergies  HOME MEDICATIONS: Outpatient Medications Prior to Visit  Medication Sig Dispense Refill  . aspirin 325 MG tablet Take 1 tablet (325 mg total) by mouth daily. 100 tablet 3  . atorvastatin (LIPITOR) 20 MG tablet TAKE 1 TABLET (20 MG TOTAL) BY MOUTH DAILY. 90 tablet 1  . b complex vitamins tablet Take 1 tablet by mouth daily. 100 tablet 3  . blood glucose meter kit and supplies KIT Dispense based on patient and insurance preference. Use two times daily as directed. (FOR ICD-9 250.00, 250.01). 1 each 0  . Cholecalciferol 1000 UNITS tablet Take 1,000 Units by mouth daily.      Marland Kitchen doxazosin (CARDURA) 2 MG tablet Take 1 tablet (2 mg total) by mouth daily. 30 tablet 11  . glucose blood (ONETOUCH VERIO) test strip Use to check blood sugars twice a day Dx E11.9 Yearly physical w/labs are due must see MD for refills 100 each 0  . linagliptin (TRADJENTA) 5 MG TABS tablet Take 1 tablet (5 mg total) by mouth daily. 30 tablet 11  . metFORMIN (GLUCOPHAGE) 1000 MG tablet TAKE 1 TABLET (1,000 MG TOTAL) BY MOUTH 2 (TWO) TIMES DAILY WITH A MEAL. 180 tablet 3  . polyethylene glycol powder (GLYCOLAX/MIRALAX) powder Take 17 g by mouth 2 (two) times daily as needed for moderate constipation.  500 g 11  . PROBIOTIC CAPS Take 1 capsule by mouth daily.      . quinapril-hydrochlorothiazide (ACCURETIC) 20-12.5 MG tablet TAKE 2 TABLETS BY MOUTH DAILY. 180 tablet 0  . sildenafil (VIAGRA) 100 MG tablet Take 1 tablet (100 mg total) by mouth daily as needed. 10 tablet 11  . triamcinolone ointment (KENALOG) 0.5 % Apply 1 application topically 2 (two) times daily. 30 g  1   No facility-administered medications prior to visit.     PAST MEDICAL HISTORY: Past Medical History:  Diagnosis Date  . Diabetes mellitus   . Hypertension   . Prostatitis 2011    PAST SURGICAL HISTORY: History reviewed. No pertinent surgical history.  FAMILY HISTORY: Family History  Problem Relation Age of Onset  . Cancer Mother 24    leukemia  . Hypertension Other   . Diabetes Maternal Grandfather     SOCIAL HISTORY: Social History   Social History  . Marital status: Married    Spouse name: N/A  . Number of children: N/A  . 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 Smoking status: Former Research scientist (life sciences)  . Smokeless tobacco: Never Used  . Alcohol use 1.8 oz/week    3 Glasses of wine per week  . Drug use: No  . Sexual activity: Yes   Other Topics Concern  . Not on file   Social History Narrative   Regular exercise- yes           PHYSICAL EXAM  Vitals:   05/20/16 0836  BP: 135/79  Pulse: 77  Weight: 217 lb 6.4 oz (98.6 kg)  Height: 5' 10" (1.778 m)   Body mass index is 31.19 kg/m.  Generalized: Well developed, in no acute distress  Head: normocephalic and atraumatic,. Oropharynx benign  Neck: Supple, no carotid bruits  Cardiac: Regular rate rhythm, no murmur  Musculoskeletal: No deformity   Neurological examination   Mentation: Alert oriented to time, place, history taking. Attention span and concentration appropriate. Recent and remote memory intact.  Follows all commands speech and language fluent.   Cranial nerve II-XII: Pupils were equal round reactive to light extraocular movements were full, visual field were full on confrontational test. Mild right nasolabial labial fold flattening Facial sensation normal. hearing was intact to finger rubbing bilaterally. Uvula tongue midline. head turning and shoulder shrug were normal and symmetric.Tongue protrusion into cheek  strength was normal. Motor: normal bulk and tone, full strength in the BUE, BLE, . Sensory: normal and symmetric to light touch, pinprick, and  Vibration, in the upper and lower extremities Coordination: finger-nose-finger, heel-to-shin bilaterally, no dysmetria, no tremor Reflexes: 1+ upper lower and symmetric plantar responses were flexor bilaterally. Gait and Station: Rising up from seated position without assistance, normal stance,  moderate stride, good arm swing, smooth turning, able to perform tiptoe, and heel walking without difficulty. Tandem gait is steady  DIAGNOSTIC DATA (LABS, IMAGING, TESTING) - I reviewed patient records, labs, notes, testing and imaging myself where available.  Lab Results  Component Value Date   WBC 8.3 05/15/2016   HGB 14.6 05/15/2016   HCT 42.8 05/15/2016   MCV 91.7 05/15/2016   PLT 296.0 05/15/2016      Component Value Date/Time   NA 137 05/15/2016 0832   NA 137 05/15/2016 0832   K 3.8 05/15/2016 0832   K 3.8 05/15/2016 0832   CL 104 05/15/2016 0832   CL 104  05/15/2016 0832   CO2 26 05/15/2016 0832   CO2 26 05/15/2016 0832   GLUCOSE 118 (H) 05/15/2016 0832   GLUCOSE 118 (H) 05/15/2016 0832   BUN 14 05/15/2016 0832   BUN 14 05/15/2016 0832   CREATININE 0.96 05/15/2016 0832   CREATININE 0.96 05/15/2016 0832   CALCIUM 9.0 05/15/2016 0832   CALCIUM 9.0 05/15/2016 0832   PROT 7.0 05/15/2016 0832   ALBUMIN 4.4 05/15/2016 0832   AST 15 05/15/2016 0832   ALT 19 05/15/2016 0832   ALKPHOS 46 05/15/2016 0832   BILITOT 0.7 05/15/2016 0832   GFRNONAA >60 01/06/2016 0213   GFRAA >60 01/06/2016 0213   Lab Results  Component Value Date   CHOL 83 05/15/2016   HDL 30.90 (L) 05/15/2016   LDLCALC 41 05/15/2016   LDLDIRECT 30.1 11/14/2010   TRIG 55.0 05/15/2016   CHOLHDL 3 05/15/2016   Lab Results  Component Value Date   HGBA1C 6.6 (H) 05/15/2016   ASSESSMENT AND PLAN  65 y.o. year old male  has a past medical history of Diabetes mellitus;  Hypertension; and Prostatitis (2011). 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 MRA of the brain was negative. The patient is a current patient of Dr. Xu  who is out of the office today . This note is sent to the work in doctor.     PLAN: Stressed the importance of continued management of risk factors to prevent further stroke Continue  Aspirin 325mg for secondary stroke prevention Maintain strict control of hypertension with blood pressure goal below 130/90, today's reading 135/79 continue antihypertensive medications Control of diabetes with hemoglobin A1c below 6.5 most recent hemoglobin A1c 6.6 continue diabetic medications Cholesterol with LDL cholesterol less than 70, continue statin drug Lipitor Continue working out at the gym, playing golf  eat healthy diet with whole grains,  fresh fruits and vegetables Discussed risk for recurrent stroke/ TIA and answered additional questions 30 day cardiac event monitoring with no events Follow-up in 6 months This was a  visit requiring 25minutes of face to face with    extensive review of history, test results counseling and answering questions.  Carolyn , GNP, BC, APRN  [facility] [address], [address] [place], [place] [address] [phone] 

## 2016-06-01 NOTE — Progress Notes (Signed)
Personally  participated in, made any corrections needed, and agree with history, physical, neuro exam,assessment and plan as stated above.    Antonia Ahern, MD [facility] 

## 2016-06-02 ENCOUNTER — Encounter: Payer: Self-pay | Admitting: Endocrinology

## 2016-06-02 ENCOUNTER — Ambulatory Visit (INDEPENDENT_AMBULATORY_CARE_PROVIDER_SITE_OTHER): Payer: Medicare Other | Admitting: Endocrinology

## 2016-06-02 VITALS — BP 126/80 | HR 64 | Ht 70.0 in | Wt 214.0 lb

## 2016-06-02 DIAGNOSIS — E1159 Type 2 diabetes mellitus with other circulatory complications: Secondary | ICD-10-CM

## 2016-06-02 LAB — POCT GLYCOSYLATED HEMOGLOBIN (HGB A1C): HEMOGLOBIN A1C: 6.3

## 2016-06-02 NOTE — Progress Notes (Signed)
Subjective:    Patient ID: Christian Clements, male    DOB: [birth year], 66 y.o.   MRN: [id number]  HPI Pt returns for f/u of diabetes mellitus: DM type: 2 Dx'ed: 5027 Complications: CVA Therapy: 2 oral meds.  DKA: never Severe hypoglycemia: never Pancreatitis: never Other: he took insulin for 1 month, in 2017.  Interval history: he says cbg's are well-controlled.  There is no trend throughout the day.  pt states she feels well in general.   Past Medical History:  Diagnosis Date  . Diabetes mellitus   . Hypertension   . Prostatitis 2011    No past surgical history on file.  Social History   Social History  . Marital status: Married    Spouse name: N/A  . Number of children: N/A  . 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 Smoking status: Former Research scientist (life sciences)  . Smokeless tobacco: Never Used  . Alcohol use 1.8 oz/week    3 Glasses of wine per week  . Drug use: No  . Sexual activity: Yes   Other Topics Concern  . Not on file   Social History Narrative   Regular exercise- yes          Current Outpatient Prescriptions on File Prior to Visit  Medication Sig Dispense Refill  . aspirin 325 MG tablet Take 1 tablet (325 mg total) by mouth daily. 100 tablet 3  . atorvastatin (LIPITOR) 20 MG tablet TAKE 1 TABLET (20 MG TOTAL) BY MOUTH DAILY. 90 tablet 1  . b complex vitamins tablet Take 1 tablet by mouth daily. 100 tablet 3  . blood glucose meter kit and supplies KIT Dispense based on patient and insurance preference. Use two times daily as directed. (FOR ICD-9 250.00, 250.01). 1 each 0  . Cholecalciferol 1000 UNITS tablet Take 1,000 Units by mouth daily.      Marland Kitchen doxazosin (CARDURA) 2 MG tablet Take 1 tablet (2 mg total) by mouth daily. 30 tablet 11  . glucose blood (ONETOUCH VERIO) test strip Use to check blood sugars twice a day Dx E11.9 Yearly physical w/labs are due must see MD for refills 100 each  0  . linagliptin (TRADJENTA) 5 MG TABS tablet Take 1 tablet (5 mg total) by mouth daily. 30 tablet 11  . metFORMIN (GLUCOPHAGE) 1000 MG tablet TAKE 1 TABLET (1,000 MG TOTAL) BY MOUTH 2 (TWO) TIMES DAILY WITH A MEAL. 180 tablet 3  . polyethylene glycol powder (GLYCOLAX/MIRALAX) powder Take 17 g by mouth 2 (two) times daily as needed for moderate constipation. 500 g 11  . PROBIOTIC CAPS Take 1 capsule by mouth daily.      . quinapril-hydrochlorothiazide (ACCURETIC) 20-12.5 MG tablet TAKE 2 TABLETS BY MOUTH DAILY. 180 tablet 0  . sildenafil (VIAGRA) 100 MG tablet Take 1 tablet (100 mg total) by mouth daily as needed. 10 tablet 11  . triamcinolone ointment (KENALOG) 0.5 % Apply 1 application topically 2 (two) times daily. 30 g 1   No current facility-administered medications on file prior to visit.     No Known Allergies  Family History  Problem Relation Age of Onset  . Cancer Mother 45    leukemia  . Hypertension Other   . Diabetes Maternal Grandfather     BP 126/80   Pulse 64   Ht '5\' 10"'$  (1.778 m)   Wt 214 lb (97.1 kg)   SpO2 97%  BMI 30.71 kg/m    Review of Systems He denies hypoglycemia.     Objective:   Physical Exam VITAL SIGNS:  See vs page GENERAL: no distress Pulses: dorsalis pedis intact bilat.   MSK: no deformity of the feet CV: no leg edema Skin:  no ulcer on the feet.  normal color and temp on the feet. Neuro: sensation is intact to touch on the feet.  Ext: There is bilateral onychomycosis of the toenails.    Lab Results  Component Value Date   HGBA1C 6.6 (H) 05/15/2016      Assessment & Plan:  Type 2 DM, with CVA: well-controlled.    Patient is advised the following: Patient Instructions  check your blood sugar once a day.  vary the time of day when you check, between before the 3 meals, and at bedtime.  also check if you have symptoms of your blood sugar being too high or too low.  please keep a record of the readings and bring it to your next  appointment here (or you can bring the meter itself).  You can write it on any piece of paper.  please call us sooner if your blood sugar goes below 70, or if you have a lot of readings over 200. Please continue the same medications.  Please come back for a follow-up appointment in 6 months.

## 2016-06-02 NOTE — Patient Instructions (Addendum)

## 2016-08-06 ENCOUNTER — Other Ambulatory Visit: Payer: Self-pay | Admitting: Internal Medicine

## 2016-08-12 ENCOUNTER — Other Ambulatory Visit (INDEPENDENT_AMBULATORY_CARE_PROVIDER_SITE_OTHER): Payer: Medicare Other

## 2016-08-12 DIAGNOSIS — E1159 Type 2 diabetes mellitus with other circulatory complications: Secondary | ICD-10-CM | POA: Diagnosis not present

## 2016-08-12 LAB — BASIC METABOLIC PANEL
BUN: 11 mg/dL (ref 6–23)
CALCIUM: 9.4 mg/dL (ref 8.4–10.5)
CHLORIDE: 104 meq/L (ref 96–112)
CO2: 27 mEq/L (ref 19–32)
CREATININE: 0.87 mg/dL (ref 0.40–1.50)
GFR: 112.77 mL/min (ref >60.00)
GLUCOSE: 105 mg/dL — AB (ref 70–99)
Potassium: 3.7 mEq/L (ref 3.5–5.1)
Sodium: 141 mEq/L (ref 135–145)

## 2016-08-12 LAB — HEMOGLOBIN A1C: Hgb A1c MFr Bld: 6.6 % — ABNORMAL HIGH (ref 4.6–6.5)

## 2016-08-13 ENCOUNTER — Encounter: Payer: Self-pay | Admitting: Internal Medicine

## 2016-08-13 ENCOUNTER — Ambulatory Visit (INDEPENDENT_AMBULATORY_CARE_PROVIDER_SITE_OTHER): Payer: Medicare Other | Admitting: Internal Medicine

## 2016-08-13 DIAGNOSIS — R21 Rash and other nonspecific skin eruption: Secondary | ICD-10-CM | POA: Diagnosis not present

## 2016-08-13 DIAGNOSIS — E1159 Type 2 diabetes mellitus with other circulatory complications: Secondary | ICD-10-CM | POA: Diagnosis not present

## 2016-08-13 DIAGNOSIS — R531 Weakness: Secondary | ICD-10-CM

## 2016-08-13 DIAGNOSIS — I10 Essential (primary) hypertension: Secondary | ICD-10-CM | POA: Diagnosis not present

## 2016-08-13 DIAGNOSIS — N401 Enlarged prostate with lower urinary tract symptoms: Secondary | ICD-10-CM | POA: Diagnosis not present

## 2016-08-13 DIAGNOSIS — R350 Frequency of micturition: Secondary | ICD-10-CM

## 2016-08-13 MED ORDER — TRIAMCINOLONE ACETONIDE 0.1 % EX CREA: 1.0000 "application " | TOPICAL_CREAM | Freq: Two times a day (BID) | CUTANEOUS | 3 refills | Status: DC

## 2016-08-13 MED ORDER — VALSARTAN 320 MG PO TABS: 320.0000 mg | ORAL_TABLET | Freq: Every day | ORAL | 3 refills | Status: DC

## 2016-08-13 NOTE — Assessment & Plan Note (Signed)
Rash: d/c acuretic Replace w/Diovan

## 2016-08-13 NOTE — Progress Notes (Signed)
Subjective:  Patient ID: Christian Clements, male    DOB: 06-30-1950  Age: 66 y.o. MRN: [id number]  CC: No chief complaint on file.   HPI Christian Clements presents for CVA, HTN, DM f/u C/o chronic eczema on hands  C/o dark rash spots on flanks x 1.5 mo  Outpatient Medications Prior to Visit  Medication Sig Dispense Refill  . aspirin 325 MG tablet Take 1 tablet (325 mg total) by mouth daily. 100 tablet 3  . atorvastatin (LIPITOR) 20 MG tablet TAKE 1 TABLET (20 MG TOTAL) BY MOUTH DAILY. 90 tablet 1  . b complex vitamins tablet Take 1 tablet by mouth daily. 100 tablet 3  . blood glucose meter kit and supplies KIT Dispense based on patient and insurance preference. Use two times daily as directed. (FOR ICD-9 250.00, 250.01). 1 each 0  . Cholecalciferol 1000 UNITS tablet Take 1,000 Units by mouth daily.      Marland Kitchen doxazosin (CARDURA) 2 MG tablet Take 1 tablet (2 mg total) by mouth daily. 30 tablet 11  . glucose blood (ONETOUCH VERIO) test strip Use to check blood sugars twice a day Dx E11.9 Yearly physical w/labs are due must see MD for refills 100 each 0  . linagliptin (TRADJENTA) 5 MG TABS tablet Take 1 tablet (5 mg total) by mouth daily. 30 tablet 11  . metFORMIN (GLUCOPHAGE) 1000 MG tablet TAKE 1 TABLET (1,000 MG TOTAL) BY MOUTH 2 (TWO) TIMES DAILY WITH A MEAL. 180 tablet 3  . polyethylene glycol powder (GLYCOLAX/MIRALAX) powder Take 17 g by mouth 2 (two) times daily as needed for moderate constipation. 500 g 11  . PROBIOTIC CAPS Take 1 capsule by mouth daily.      . quinapril-hydrochlorothiazide (ACCURETIC) 20-12.5 MG tablet TAKE 2 TABLETS BY MOUTH DAILY. 180 tablet 1  . sildenafil (VIAGRA) 100 MG tablet Take 1 tablet (100 mg total) by mouth daily as needed. 10 tablet 11  . triamcinolone ointment (KENALOG) 0.5 % Apply 1 application topically 2 (two) times daily. 30 g 1   No facility-administered medications prior to visit.     ROS Review of Systems  Constitutional: Negative for appetite  change, fatigue and unexpected weight change.  HENT: Negative for congestion, nosebleeds, sneezing, sore throat and trouble swallowing.   Eyes: Negative for itching and visual disturbance.  Respiratory: Negative for cough.   Cardiovascular: Negative for chest pain, palpitations and leg swelling.  Gastrointestinal: Negative for abdominal distention, blood in stool, diarrhea and nausea.  Genitourinary: Negative for frequency and hematuria.  Musculoskeletal: Negative for back pain, gait problem, joint swelling and neck pain.  Skin: Positive for color change and rash.  Neurological: Positive for weakness. Negative for dizziness, tremors and speech difficulty.  Psychiatric/Behavioral: Negative for agitation, dysphoric mood and sleep disturbance. The patient is not nervous/anxious.     Objective:  BP 132/84 (BP Location: Left Arm, Patient Position: Sitting, Cuff Size: Normal)   Pulse (!) 59   Temp 98.4 F (36.9 C) (Oral)   Ht '5\' 10"'$  (1.778 m)   Wt 210 lb (95.3 kg)   SpO2 99%   BMI 30.13 kg/m   BP Readings from Last 3 Encounters:  08/13/16 132/84  06/02/16 126/80  05/20/16 135/79    Wt Readings from Last 3 Encounters:  08/13/16 210 lb (95.3 kg)  06/02/16 214 lb (97.1 kg)  05/20/16 217 lb 6.4 oz (98.6 kg)    Physical Exam  Constitutional: He is oriented to person, place, and time. He appears well-developed. No distress.  NAD  HENT:  Mouth/Throat: Oropharynx is clear and moist.  Eyes: Conjunctivae are normal. Pupils are equal, round, and reactive to light.  Neck: Normal range of motion. No JVD present. No thyromegaly present.  Cardiovascular: Normal rate, regular rhythm, normal heart sounds and intact distal pulses.  Exam reveals no gallop and no friction rub.   No murmur heard. Pulmonary/Chest: Effort normal and breath sounds normal. No respiratory distress. He has no wheezes. He has no rales. He exhibits no tenderness.  Abdominal: Soft. Bowel sounds are normal. He exhibits no  distension and no mass. There is no tenderness. There is no rebound and no guarding.  Musculoskeletal: Normal range of motion. He exhibits no edema or tenderness.  Lymphadenopathy:    He has no cervical adenopathy.  Neurological: He is alert and oriented to person, place, and time. He has normal reflexes. No cranial nerve deficit. He exhibits normal muscle tone. He displays a negative Romberg sign. Coordination and gait normal.  Skin: Skin is warm and dry. Rash noted.  Psychiatric: He has a normal mood and affect. His behavior is normal. Judgment and thought content normal.  Hyperpigmented papules on B flanks 1-2 cm - oval and 1 round on L foot 3 cm round  Lab Results  Component Value Date   WBC 8.3 05/15/2016   HGB 14.6 05/15/2016   HCT 42.8 05/15/2016   PLT 296.0 05/15/2016   GLUCOSE 105 (H) 08/12/2016   CHOL 83 05/15/2016   TRIG 55.0 05/15/2016   HDL 30.90 (L) 05/15/2016   LDLDIRECT 30.1 11/14/2010   LDLCALC 41 05/15/2016   ALT 19 05/15/2016   AST 15 05/15/2016   NA 141 08/12/2016   K 3.7 08/12/2016   CL 104 08/12/2016   CREATININE 0.87 08/12/2016   BUN 11 08/12/2016   CO2 27 08/12/2016   TSH 1.67 11/08/2012   PSA 0.32 11/14/2010   INR 1.2 (H) 05/15/2016   HGBA1C 6.6 (H) 08/12/2016    Ct Head Wo Contrast  Result Date: 01/04/2016 CLINICAL DATA:  Right upper extremity numbness and unsteady gait. History of hypertension. Diabetes. EXAM: CT HEAD WITHOUT CONTRAST TECHNIQUE: Contiguous axial images were obtained from the base of the skull through the vertex without intravenous contrast. COMPARISON:  None. FINDINGS: Brain: Mild low density in the periventricular white matter likely related to small vessel disease. No mass lesion, hemorrhage, hydrocephalus, acute infarct, intra-axial, or extra-axial fluid collection. Vascular: No hyperdense vessel or unexpected calcification. Skull: Normal Sinuses/Orbits: Normal orbits and globes. Right ethmoid air cell mucous retention cyst or  polyp. Clear mastoid air cells. Other: None IMPRESSION: 1.  No acute intracranial abnormality. 2. Mild small vessel ischemic change. Electronically Signed   By: Abigail Miyamoto M.D.   On: 01/04/2016 13:12   Mr Brain Wo Contrast  Result Date: 01/04/2016 CLINICAL DATA:  Acute onset of numbness of the right hand and fingers while exercising. EXAM: MRI HEAD WITHOUT CONTRAST MRA HEAD WITHOUT CONTRAST TECHNIQUE: Multiplanar, multiecho pulse sequences of the brain and surrounding structures were obtained without intravenous contrast. Angiographic images of the head were obtained using MRA technique without contrast. COMPARISON:  CT same day FINDINGS: MRI HEAD FINDINGS Brain: Diffusion imaging shows a sub cm acute infarction at the pontomedullary junction anteriorly just to the left of midline. There is also a small linear white matter acute infarction in the left frontal subcortical white matter. There are mild chronic small-vessel changes of the pons. There are moderate chronic small-vessel ischemic changes of the cerebral hemispheric white matter.  No large vessel territory infarction. No mass lesion, hemorrhage, hydrocephalus or extra-axial collection. Vascular: Major vessels at the base of the brain show flow. Skull and upper cervical spine: Negative Sinuses/Orbits: Clear Other: None significant MRA HEAD FINDINGS Both internal carotid arteries are widely patent into the brain. The anterior and middle cerebral vessels are normal without proximal stenosis, aneurysm or vascular malformation. Fetal origin of the left PCA. Large posterior communicating artery on the right. Both vertebral arteries are patent. The right terminates in PICA. Left vertebral artery supplies the basilar. No basilar stenosis. Posterior circulation branch vessels are patent and normal. 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 Small acute left frontal subcortical white matter infarction.  These 2 acute infarctions could be coincidental in due to ordinary small vessel disease, but emboli from the heart or ascending aorta could also result in this. Moderate chronic small-vessel ischemic changes of the cerebral hemispheric white matter for a age. Negative intracranial MR angiography of the large and medium size vessels. Electronically Signed   By: Nelson Chimes M.D.   On: 01/04/2016 17:41   Mr Jodene Nam Head/brain NL Cm  Result Date: 01/04/2016 CLINICAL DATA:  Acute onset of numbness of the right hand and fingers while exercising. EXAM: MRI HEAD WITHOUT CONTRAST MRA HEAD WITHOUT CONTRAST TECHNIQUE: Multiplanar, multiecho pulse sequences of the brain and surrounding structures were obtained without intravenous contrast. Angiographic images of the head were obtained using MRA technique without contrast. COMPARISON:  CT same day FINDINGS: MRI HEAD FINDINGS Brain: Diffusion imaging shows a sub cm acute infarction at the pontomedullary junction anteriorly just to the left of midline. There is also a small linear white matter acute infarction in the left frontal subcortical white matter. There are mild chronic small-vessel changes of the pons. There are moderate chronic small-vessel ischemic changes of the cerebral hemispheric white matter. No large vessel territory infarction. No mass lesion, hemorrhage, hydrocephalus or extra-axial collection. Vascular: Major vessels at the base of the brain show flow. Skull and upper cervical spine: Negative Sinuses/Orbits: Clear Other: None significant MRA HEAD FINDINGS Both internal carotid arteries are widely patent into the brain. The anterior and middle cerebral vessels are normal without proximal stenosis, aneurysm or vascular malformation. Fetal origin of the left PCA. Large posterior communicating artery on the right. Both vertebral arteries are patent. The right terminates in PICA. Left vertebral artery supplies the basilar. No basilar stenosis. Posterior  circulation branch vessels are patent and normal. 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 Small acute left frontal subcortical white matter infarction. These 2 acute infarctions could be coincidental in due to ordinary small vessel disease, but emboli from the heart or ascending aorta could also result in this. Moderate chronic small-vessel ischemic changes of the cerebral hemispheric white matter for a age. Negative intracranial MR angiography of the large and medium size vessels. Electronically Signed   By: Nelson Chimes M.D.   On: 01/04/2016 17:41    Assessment & Plan:   There are no diagnoses linked to this encounter. I am having Mr. Pilch maintain his Probiotic, Cholecalciferol, sildenafil, triamcinolone ointment, blood glucose meter kit and supplies, linagliptin, aspirin, glucose blood, metFORMIN, atorvastatin, polyethylene glycol powder, doxazosin, b complex vitamins, and quinapril-hydrochlorothiazide.  No orders of the defined types were placed in this encounter.    Follow-up: No Follow-up on file.  Walker Kehr, MD

## 2016-08-13 NOTE — Assessment & Plan Note (Signed)
Cardura 

## 2016-08-13 NOTE — Assessment & Plan Note (Signed)
Labs

## 2016-08-13 NOTE — Assessment & Plan Note (Signed)
Improving w/exercise

## 2016-08-13 NOTE — Assessment & Plan Note (Addendum)
Hyperpigmented papules on B flanks 1-2 cm - oval and 1 round on L foot 3 cm round - ?drug rash. Will d/c accuretic  If not better - d/c doxazosin

## 2016-08-13 NOTE — Patient Instructions (Addendum)
Stop accuretic. Start Diovan.

## 2016-09-10 ENCOUNTER — Ambulatory Visit (INDEPENDENT_AMBULATORY_CARE_PROVIDER_SITE_OTHER): Payer: Medicare Other | Admitting: Sports Medicine

## 2016-09-10 ENCOUNTER — Encounter: Payer: Self-pay | Admitting: Sports Medicine

## 2016-09-10 ENCOUNTER — Ambulatory Visit: Payer: Self-pay

## 2016-09-10 VITALS — BP 150/100 | HR 72 | Ht 70.0 in | Wt 208.4 lb

## 2016-09-10 DIAGNOSIS — E1159 Type 2 diabetes mellitus with other circulatory complications: Secondary | ICD-10-CM

## 2016-09-10 DIAGNOSIS — E1169 Type 2 diabetes mellitus with other specified complication: Secondary | ICD-10-CM

## 2016-09-10 DIAGNOSIS — M25561 Pain in right knee: Secondary | ICD-10-CM

## 2016-09-10 DIAGNOSIS — R531 Weakness: Secondary | ICD-10-CM

## 2016-09-10 DIAGNOSIS — I639 Cerebral infarction, unspecified: Secondary | ICD-10-CM | POA: Diagnosis not present

## 2016-09-10 DIAGNOSIS — E785 Hyperlipidemia, unspecified: Secondary | ICD-10-CM | POA: Diagnosis not present

## 2016-09-10 DIAGNOSIS — M25551 Pain in right hip: Secondary | ICD-10-CM | POA: Diagnosis not present

## 2016-09-10 NOTE — Progress Notes (Signed)
OFFICE VISIT NOTE Christian Clements. Christian Clements, [place] [facility] [phone]  Cyan [place] - 66 y.o. male MRN [id number]  Date of birth: 21-Jul-1950  Visit Date: 09/10/2016  PCP: Cassandria Anger, MD   Referred by: Cassandria Anger, MD  Christian Clements,cma acting as scribe for Dr. Paulla Fore.  SUBJECTIVE:   Chief Complaint  Patient presents with  . New Patient (Initial Visit)  . Hip Pain   HPI: As below and per problem based documentation when appropriate.   RT HIP Pain: Christian Clements reports a 2 week HX of RT Hip/Groin Pain. Area of Sx are triggered when reaching downward with RT hand to stretching. No radiation down the leg. Location is anterior and posterior.   RT Knee Pain: Locking in RT knee. Noticeable when walking. He does walk with a limp. This started x2 weeks ago. No previous surgery or imaging on knee. No radiation of pain.  Christian Clements had a right sided stroke on 01-04-2016. He completed an 8 week course of PT. He has continued this exercise routine at home 5-6 days per week. No current medication therapies for sx.      Review of Systems  Constitutional: Negative for chills, diaphoresis, fever, malaise/fatigue and weight loss.  HENT: Negative.   Eyes: Negative.   Respiratory: Negative.   Cardiovascular: Negative.   Gastrointestinal: Negative.   Genitourinary: Negative.   Musculoskeletal: Positive for joint pain and myalgias.  Skin: Negative for itching and rash.  Neurological: Negative for dizziness, tingling, tremors, sensory change, speech change, focal weakness, seizures, loss of consciousness, weakness and headaches.  Endo/Heme/Allergies: Negative for environmental allergies and polydipsia. Does not bruise/bleed easily.  Psychiatric/Behavioral: Negative.     Otherwise per HPI.  HISTORY & PERTINENT PRIOR DATA:  No specialty comments available. He reports that he has quit smoking. He has never used smokeless tobacco.    Recent Labs  05/15/16 0832 06/02/16 0928 08/12/16 0742  HGBA1C 6.6* 6.3 6.6*   Medications & Allergies reviewed per EMR Patient Active Problem List   Diagnosis Date Noted  . Right knee pain 09/10/2016  . Rash 08/13/2016  . BPH (benign prostatic hyperplasia) 05/16/2016  . Hypertension associated with diabetes ([place]) 05/16/2016  . Constipation 05/16/2016  . Acute CVA (cerebrovascular accident) ([place]) 01/05/2016  . Right sided weakness 01/05/2016  . Dyslipidemia associated with type 2 diabetes mellitus ([place]) 01/05/2016  . Hypokalemia 01/05/2016  . Type 2 diabetes mellitus with vascular disease ([place]) 10/21/2006   Past Medical History:  Diagnosis Date  . Diabetes mellitus   . Hypertension   . Prostatitis 2011   Family History  Problem Relation Age of Onset  . Cancer Mother 20       leukemia  . Hypertension Other   . Diabetes Maternal Grandfather    History reviewed. No pertinent surgical history. Social History   Occupational History  . PE teacher [employer]   Social History Main Topics  . Smoking status: Former Research scientist (life sciences)  . Smokeless tobacco: Never Used  . Alcohol use 1.8 oz/week    3 Glasses of wine per week  . Drug use: No  . Sexual activity: Yes    OBJECTIVE:  VS:  HT:5\' 10"  (177.8 cm)   WT:208 lb 6.4 oz (94.5 kg)  BMI:30    BP:(!) 150/100  HR:72bpm  TEMP: ( )  RESP:98 % EXAM: Findings:  WDWN, NAD, Non-toxic appearing Alert & appropriately interactive Not depressed or anxious appearing No increased work of breathing.  Pupils are equal. EOM intact without nystagmus No clubbing or cyanosis of the extremities appreciated No significant rashes/lesions/ulcerations overlying the examined area. DP & PT pulses 2+/4.  No significant pretibial edema. Sensation intact to light touch in lower extremities. Bilateral lower extremities overall well aligned.  He does have a noticeable amount of atrophy within the right leg compared to the left.  He  has Poor VMO definition.  He is a small effusion with synovitis on the right knee none on the left.  He has no pain with internal rotation and external rotation of the hip.  Negative logroll.  Negative Stinchfield.  He is ligamentously stable at the knee.  No significant medial or lateral joint line pain.  No significant patellar grind.  Negative McMurray's.     No results found. ASSESSMENT & PLAN:   Problem List Items Addressed This Visit    Type 2 diabetes mellitus with vascular disease ([place])   Acute CVA (cerebrovascular accident) ([place])   Right sided weakness    This is likely contributed to abnormality in his gait.  He has no significant limitations on range of motion of his right hip and no pain with passive range of motion.  I suspect the majority of his hip pain is coming from referred pain from his knee.  We will go ahead and inject his knee today have him begin working on increasing neuromuscular control for the right VMO and right hip abductor's C is weak in both of these areas.  If any lack of improvement we can consider further diagnostic imaging with x-rays of the knee in the hip as well as consider x-rays of the back.  He is going to continue working out on a daily basis in addition to adding these exercises.  If any lack of improvement he will follow-up sooner but otherwise we will check in with him in 4 weeks.      Dyslipidemia associated with type 2 diabetes mellitus (HCC) (Chronic)   Right knee pain - Primary    PROCEDURE NOTE -  ULTRASOUND GUIDEDINJECTION: Right Knee Images were obtained and interpreted by myself, Teresa Coombs, DO  Images have been saved and stored to PACS system. Images obtained on: GE S7 Ultrasound machine  ULTRASOUND FINDINGS: Supraphysiologic effusion, degenerative medial meniscus bulge with para meniscal fluid.  No significant degenerative change of the knee  DESCRIPTION OF PROCEDURE:  The patient's clinical condition is marked by substantial pain  and/or significant functional disability. Other conservative therapy has not provided relief, is contraindicated, or not appropriate. There is a reasonable likelihood that injection will significantly improve the patient's pain and/or functional impairment. After discussing the risks, benefits and expected outcomes of the injection and all questions were reviewed and answered, the patient wished to undergo the above named procedure. Verbal consent was obtained. The ultrasound was used to identify the target structure and adjacent neurovascular structures. The skin was then prepped in sterile fashion and the target structure was injected under direct visualization using sterile technique as below: PREP: Alcohol, Ethel Chloride APPROACH: Superiolateral, 21 g 2 " needle INJECTATE: 2cc 1% lidocaine, 2 cc 40mg  DepoMedrol ASPIRATE: N/A DRESSING: Band-Aid  Post procedural instructions including recommending icing and warning signs for infection were reviewed. This procedure was well tolerated and there were no complications.   IMPRESSION: Succesful US Guided Injection          Relevant Orders   [place] LIMITED JOINT SPACE STRUCTURES LOW RIGHT(NO LINKED CHARGES)    Other Visit Diagnoses  Pain of right hip joint          Follow-up: Return in about 6 weeks (around 10/22/2016).   CMA/ATC served as Education administrator during this visit. History, Physical, and Plan performed by medical provider. Documentation and orders reviewed and attested to.      Teresa Coombs, [facility] Physician

## 2016-09-10 NOTE — Assessment & Plan Note (Signed)
PROCEDURE NOTE -  ULTRASOUND GUIDEDINJECTION: Right Knee Images were obtained and interpreted by myself, Teresa Coombs, DO  Images have been saved and stored to PACS system. Images obtained on: GE S7 Ultrasound machine  ULTRASOUND FINDINGS: Supraphysiologic effusion, degenerative medial meniscus bulge with para meniscal fluid.  No significant degenerative change of the knee  DESCRIPTION OF PROCEDURE:  The patient's clinical condition is marked by substantial pain and/or significant functional disability. Other conservative therapy has not provided relief, is contraindicated, or not appropriate. There is a reasonable likelihood that injection will significantly improve the patient's pain and/or functional impairment. After discussing the risks, benefits and expected outcomes of the injection and all questions were reviewed and answered, the patient wished to undergo the above named procedure. Verbal consent was obtained. The ultrasound was used to identify the target structure and adjacent neurovascular structures. The skin was then prepped in sterile fashion and the target structure was injected under direct visualization using sterile technique as below: PREP: Alcohol, Ethel Chloride APPROACH: Superiolateral, 21 g 2 " needle INJECTATE: 2cc 1% lidocaine, 2 cc 40mg  DepoMedrol ASPIRATE: N/A DRESSING: Band-Aid  Post procedural instructions including recommending icing and warning signs for infection were reviewed. This procedure was well tolerated and there were no complications.   IMPRESSION: Succesful US Guided Injection

## 2016-09-10 NOTE — Patient Instructions (Signed)

## 2016-09-10 NOTE — Assessment & Plan Note (Signed)
This is likely contributed to abnormality in his gait.  He has no significant limitations on range of motion of his right hip and no pain with passive range of motion.  I suspect the majority of his hip pain is coming from referred pain from his knee.  We will go ahead and inject his knee today have him begin working on increasing neuromuscular control for the right VMO and right hip abductor's C is weak in both of these areas.  If any lack of improvement we can consider further diagnostic imaging with x-rays of the knee in the hip as well as consider x-rays of the back.  He is going to continue working out on a daily basis in addition to adding these exercises.  If any lack of improvement he will follow-up sooner but otherwise we will check in with him in 4 weeks.

## 2016-09-14 ENCOUNTER — Other Ambulatory Visit: Payer: Self-pay | Admitting: Internal Medicine

## 2016-10-24 NOTE — Progress Notes (Signed)
OFFICE VISIT NOTE Juanda Bond. Riese Hellard, [place] at [facility] [phone]  Christian Clements - 66 y.o. male MRN [id number]  Date of birth: 03-05-1951  Visit Date: 10/27/2016  PCP: Cassandria Anger, MD   Referred by: Cassandria Anger, MD  Burlene Arnt, CMA acting as scribe for Dr. Paulla Fore.  SUBJECTIVE:   Chief Complaint  Patient presents with  . Follow-up    right hip/groin pain   HPI: As below and per problem based documentation when appropriate.  Christian Clements presents today in follow-up of right hip/groin pain and right knee pain. He had ultrasound guided steroid injection at his last visit 09/10/16. Ultrasound showed the following: ULTRASOUND FINDINGS: Supraphysiologic effusion, degenerative medial meniscus bulge with para meniscal fluid.  No significant degenerative change of the knee He was also given home exercises, VMO and hip abductor.  Pt reports increased ROM in his right hip. He still has some tightness in the hip/groin when bending at the hips and reaching down with the right hand. He has not pain unless he is reaching toward the ground.  He is still feeling catching in the right knee when he walks. He has not trouble with the knee when he is not walking. He is able to jog in the pool with no trouble. Her has noticed some swelling just below the knee on the right leg.  He has been doing home exercises. He has some trouble when straightening the right leg, he is unable to flex is foot and it tends to roll inward.  He denies low back pain.     Review of Systems  Constitutional: Negative for chills and fever.  Respiratory: Negative for wheezing.   Cardiovascular: Positive for leg swelling. Negative for chest pain and palpitations.  Musculoskeletal: Positive for joint pain. Negative for back pain and falls.  Neurological: Positive for tingling (right hand and arm and toes). Negative for dizziness and headaches.    Endo/Heme/Allergies: Does not bruise/bleed easily.    Otherwise per HPI.  HISTORY & PERTINENT PRIOR DATA:  No specialty comments available. He reports that he has quit smoking. He has never used smokeless tobacco.   Recent Labs  05/15/16 0832 06/02/16 0928 08/12/16 0742  HGBA1C 6.6* 6.3 6.6*   Medications & Allergies reviewed per EMR Patient Active Problem List   Diagnosis Date Noted  . Right knee pain 09/10/2016  . Rash 08/13/2016  . BPH (benign prostatic hyperplasia) 05/16/2016  . Hypertension associated with diabetes ([place]) 05/16/2016  . Constipation 05/16/2016  . Acute CVA (cerebrovascular accident) ([place]) 01/05/2016  . Right sided weakness 01/05/2016  . Dyslipidemia associated with type 2 diabetes mellitus ([place]) 01/05/2016  . Hypokalemia 01/05/2016  . Type 2 diabetes mellitus with vascular disease ([place]) 10/21/2006   Past Medical History:  Diagnosis Date  . Diabetes mellitus   . Hypertension   . Prostatitis 2011   Family History  Problem Relation Age of Onset  . Cancer Mother 85       leukemia  . Hypertension Other   . Diabetes Maternal Grandfather    No past surgical history on file. Social History   Occupational History  . PE teacher [employer]   Social History Main Topics  . Smoking status: Former Research scientist (life sciences)  . Smokeless tobacco: Never Used  . Alcohol use 1.8 oz/week    3 Glasses of wine per week  . Drug use: No  . Sexual activity: Yes    OBJECTIVE:  VS:  HT:5\' 10"  (177.8 cm)   WT:211 lb (95.7 kg)  BMI:30.3    BP:(!) 150/90  HR:70bpm  TEMP: ( )  RESP:96 % EXAM: Findings:  WDWN, NAD, Non-toxic appearing Alert & appropriately interactive Not depressed or anxious appearing No increased work of breathing. Pupils are equal. EOM intact without nystagmus No clubbing or cyanosis of the extremities appreciated No significant rashes/lesions/ulcerations overlying the examined area. DP & PT pulses 2+/4.  No significant pretibial edema.  No  clubbing or cyanosis Slightly diminished sensation in the right lower extremity but a nondermatomal distribution.  DTRs are slightly diminished in the right compared to left  Right Knee: Overall joint is well aligned, no significant deformity.   No significant effusion.   ROM: 0 to 120.   Extensor mechanism intact.  Hip flexor weakness is noted at 3+/4. No significant medial or lateral joint line tenderness.   Stable to varus/valgus strain & anterior/posterior drawer.  Normal Lachman's.   Negative McMurray's and Thessaly.      Neurological Exam Dg Lumbar Spine Complete  Result Date: 10/27/2016 CLINICAL DATA:  Chronic low back pain. EXAM: LUMBAR SPINE - COMPLETE 4+ VIEW COMPARISON:  None. FINDINGS: There is no evidence of lumbar spine fracture. Alignment is normal. Intervertebral disc spaces are maintained. No facet arthritis. Aortic atherosclerosis. IMPRESSION: Negative lumbar spine. Aortic atherosclerosis. Electronically Signed   By: Lorriane Shire M.D.   On: 10/27/2016 09:13   Dg Knee 1-2 Views Right  Result Date: 10/27/2016 CLINICAL DATA:  Chronic right knee pain. EXAM: RIGHT KNEE - 1-2 VIEW COMPARISON:  None. FINDINGS: No evidence of fracture, dislocation, or joint effusion. No evidence of arthropathy. Enthesophyte formation at the origin of the patellar tendon from the lower pole of the patella. Soft tissues are unremarkable. IMPRESSION: No acute abnormality. 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 Electronically Signed   By: Lorriane Shire M.D.   On: 10/27/2016 09:18   Dg Hip Unilat W Or W/o Pelvis 2-3 Views Right  Result Date: 10/27/2016 CLINICAL DATA:  Chronic right hip pain. EXAM: DG HIP (WITH OR WITHOUT PELVIS) 2-3V RIGHT COMPARISON:  None. FINDINGS: There is no evidence of hip fracture or dislocation. There is no evidence of arthropathy or other focal bone abnormality. IMPRESSION: Negative. Electronically Signed   By: Lorriane Shire M.D.   On: 10/27/2016  09:12   ASSESSMENT & PLAN:     ICD-10-CM   1. Right hip pain M25.551 DG HIP UNILAT W OR W/O PELVIS 2-3 VIEWS RIGHT    DG Lumbar Spine Complete    Ambulatory referral to Neurology  2. Right knee pain, unspecified chronicity M25.561 DG Knee 1-2 Views Right    Ambulatory referral to Neurology  3. Acute CVA (cerebrovascular accident) [facility]) I63.9 Ambulatory referral to Neurology  4. Right sided weakness R53.1 Ambulatory referral to Neurology  ================================================================= Right sided weakness This is a difficult situation as he has multiple confounding factors that are likely contributing to the discomfort in his right leg and knee that he is experiencing.  He has a history of prior CVA that may be causing significant hip flexor weakness however this was not previously noted.  He does have fairly marked amount of weakness with hip flexion and further evaluation with nerve conduction studies would help with differentiating a true radiculopathy versus the potential central process.  If there is evidence of radiculopathy on nerve conduction studies further evaluation with MRI would be warranted.  Referral placed to [facility] neurology for nerve conduction  studies and EMGs of the right lower extremity as well as to establish care for stroke follow-up as he is interested in establishing with a new neurologist.  Right knee pain Some underlying degenerative changes however this is minimal and does not completely explain the symptoms that he is experiencing.  He does have a fairly marked amount of hip flexor weakness today on exam and I suspect this is contributing to majority of his symptoms.  Etiology of hip flexor weakness needs to be further evaluated as outlined above.  Acute CVA (cerebrovascular accident) [facility]) Referral to [facility] neurology to establish for post stroke follow-up =================================================================  Follow-up: Return for  after nerve conduction studies.   CMA/ATC served as Education administrator during this visit. History, Physical, and Plan performed by medical provider. Documentation and orders reviewed and attested to.      Teresa Coombs, [place] [facility] Physician

## 2016-10-27 ENCOUNTER — Ambulatory Visit (INDEPENDENT_AMBULATORY_CARE_PROVIDER_SITE_OTHER): Payer: Medicare Other

## 2016-10-27 ENCOUNTER — Encounter: Payer: Self-pay | Admitting: Sports Medicine

## 2016-10-27 ENCOUNTER — Ambulatory Visit (INDEPENDENT_AMBULATORY_CARE_PROVIDER_SITE_OTHER): Payer: Medicare Other | Admitting: Sports Medicine

## 2016-10-27 VITALS — BP 150/90 | HR 70 | Ht 70.0 in | Wt 211.0 lb

## 2016-10-27 DIAGNOSIS — R531 Weakness: Secondary | ICD-10-CM | POA: Diagnosis not present

## 2016-10-27 DIAGNOSIS — M25561 Pain in right knee: Secondary | ICD-10-CM

## 2016-10-27 DIAGNOSIS — I639 Cerebral infarction, unspecified: Secondary | ICD-10-CM | POA: Diagnosis not present

## 2016-10-27 DIAGNOSIS — M25551 Pain in right hip: Secondary | ICD-10-CM

## 2016-10-29 NOTE — Assessment & Plan Note (Signed)
Some underlying degenerative changes however this is minimal and does not completely explain the symptoms that he is experiencing.  He does have a fairly marked amount of hip flexor weakness today on exam and I suspect this is contributing to majority of his symptoms.  Etiology of hip flexor weakness needs to be further evaluated as outlined above.

## 2016-10-29 NOTE — Assessment & Plan Note (Signed)
This is a difficult situation as he has multiple confounding factors that are likely contributing to the discomfort in his right leg and knee that he is experiencing.  He has a history of prior CVA that may be causing significant hip flexor weakness however this was not previously noted.  He does have fairly marked amount of weakness with hip flexion and further evaluation with nerve conduction studies would help with differentiating a true radiculopathy versus the potential central process.  If there is evidence of radiculopathy on nerve conduction studies further evaluation with MRI would be warranted.  Referral placed to [facility] neurology for nerve conduction studies and EMGs of the right lower extremity as well as to establish care for stroke follow-up as he is interested in establishing with a new neurologist.

## 2016-10-29 NOTE — Assessment & Plan Note (Signed)
Referral to [facility] neurology to establish for post stroke follow-up

## 2016-10-30 ENCOUNTER — Encounter: Payer: Self-pay | Admitting: Neurology

## 2016-11-12 ENCOUNTER — Other Ambulatory Visit (INDEPENDENT_AMBULATORY_CARE_PROVIDER_SITE_OTHER): Payer: Medicare Other

## 2016-11-12 DIAGNOSIS — E1159 Type 2 diabetes mellitus with other circulatory complications: Secondary | ICD-10-CM | POA: Diagnosis not present

## 2016-11-12 LAB — BASIC METABOLIC PANEL
BUN: 18 mg/dL (ref 6–23)
CHLORIDE: 102 meq/L (ref 96–112)
CO2: 27 meq/L (ref 19–32)
Calcium: 10.3 mg/dL (ref 8.4–10.5)
Creatinine, Ser: 1.02 mg/dL (ref 0.40–1.50)
GFR: 93.78 mL/min (ref >60.00)
GLUCOSE: 124 mg/dL — AB (ref 70–99)
Potassium: 3.9 mEq/L (ref 3.5–5.1)
SODIUM: 140 meq/L (ref 135–145)

## 2016-11-12 LAB — HEMOGLOBIN A1C: HEMOGLOBIN A1C: 6.2 % (ref 4.6–6.5)

## 2016-11-13 ENCOUNTER — Encounter: Payer: Self-pay | Admitting: Internal Medicine

## 2016-11-13 ENCOUNTER — Ambulatory Visit (INDEPENDENT_AMBULATORY_CARE_PROVIDER_SITE_OTHER): Payer: Medicare Other | Admitting: Internal Medicine

## 2016-11-13 DIAGNOSIS — I639 Cerebral infarction, unspecified: Secondary | ICD-10-CM

## 2016-11-13 DIAGNOSIS — E1159 Type 2 diabetes mellitus with other circulatory complications: Secondary | ICD-10-CM | POA: Diagnosis not present

## 2016-11-13 DIAGNOSIS — R531 Weakness: Secondary | ICD-10-CM | POA: Diagnosis not present

## 2016-11-13 DIAGNOSIS — I10 Essential (primary) hypertension: Secondary | ICD-10-CM | POA: Diagnosis not present

## 2016-11-13 MED ORDER — AMLODIPINE BESYLATE-VALSARTAN 5-320 MG PO TABS
1.0000 | ORAL_TABLET | Freq: Every day | ORAL | 3 refills | Status: DC
Start: 2016-11-13 — End: 2017-11-08

## 2016-11-13 NOTE — Assessment & Plan Note (Signed)
ASA, Lipitor, Exforge 

## 2016-11-13 NOTE — Progress Notes (Signed)
Subjective:  Patient ID: Christian Clements, male    DOB: 09-Nov-1950  Age: 66 y.o. MRN: [id number]  CC: No chief complaint on file.   HPI Christian Clements presents for HTN, CVA, R weakness, DM f/u. Unable to play golf...  Outpatient Medications Prior to Visit  Medication Sig Dispense Refill  . aspirin 325 MG tablet Take 1 tablet (325 mg total) by mouth daily. 100 tablet 3  . atorvastatin (LIPITOR) 20 MG tablet TAKE 1 TABLET (20 MG TOTAL) BY MOUTH DAILY. 90 tablet 1  . b complex vitamins tablet Take 1 tablet by mouth daily. 100 tablet 3  . blood glucose meter kit and supplies KIT Dispense based on patient and insurance preference. Use two times daily as directed. (FOR ICD-9 250.00, 250.01). 1 each 0  . Cholecalciferol 1000 UNITS tablet Take 1,000 Units by mouth daily.      Marland Kitchen doxazosin (CARDURA) 2 MG tablet Take 1 tablet (2 mg total) by mouth daily. 30 tablet 11  . glucose blood (ONETOUCH VERIO) test strip Use to check blood sugars twice a day Dx E11.9 Yearly physical w/labs are due must see MD for refills 100 each 0  . linagliptin (TRADJENTA) 5 MG TABS tablet Take 1 tablet (5 mg total) by mouth daily. 30 tablet 11  . metFORMIN (GLUCOPHAGE) 1000 MG tablet TAKE 1 TABLET (1,000 MG TOTAL) BY MOUTH 2 (TWO) TIMES DAILY WITH A MEAL. 180 tablet 3  . polyethylene glycol powder (GLYCOLAX/MIRALAX) powder Take 17 g by mouth 2 (two) times daily as needed for moderate constipation. 500 g 11  . PROBIOTIC CAPS Take 1 capsule by mouth daily.      . sildenafil (VIAGRA) 100 MG tablet Take 1 tablet (100 mg total) by mouth daily as needed. 10 tablet 11  . triamcinolone cream (KENALOG) 0.1 % Apply 1 application topically 2 (two) times daily. 80 g 3  . valsartan (DIOVAN) 320 MG tablet Take 1 tablet (320 mg total) by mouth daily. (Patient not taking: Reported on 11/13/2016) 90 tablet 3   No facility-administered medications prior to visit.     ROS Review of Systems  Constitutional: Negative for appetite change,  fatigue and unexpected weight change.  HENT: Negative for congestion, nosebleeds, sneezing, sore throat and trouble swallowing.   Eyes: Negative for itching and visual disturbance.  Respiratory: Negative for cough.   Cardiovascular: Negative for chest pain, palpitations and leg swelling.  Gastrointestinal: Negative for abdominal distention, blood in stool, diarrhea and nausea.  Genitourinary: Negative for frequency and hematuria.  Musculoskeletal: Positive for gait problem. Negative for back pain, joint swelling and neck pain.  Skin: Negative for rash.  Neurological: Positive for weakness and numbness. Negative for dizziness, tremors and speech difficulty.  Psychiatric/Behavioral: Negative for agitation, dysphoric mood and sleep disturbance. The patient is not nervous/anxious.     Objective:  BP (!) 142/80 (BP Location: Left Arm, Patient Position: Sitting, Cuff Size: Large)   Pulse (!) 59   Temp 98 F (36.7 C) (Oral)   Ht _0  (1.778 m)   Wt 209 lb (94.8 kg)   SpO2 99%   BMI 29.99 kg/m   BP Readings from Last 3 Encounters:  11/13/16 (!) 142/80  10/27/16 (!) 150/90  09/10/16 (!) 150/100    Wt Readings from Last 3 Encounters:  11/13/16 209 lb (94.8 kg)  10/27/16 211 lb (95.7 kg)  09/10/16 208 lb 6.4 oz (94.5 kg)    Physical Exam  Constitutional: He is oriented to person, place, and time.  He appears well-developed. No distress.  NAD  HENT:  Mouth/Throat: Oropharynx is clear and moist.  Eyes: Pupils are equal, round, and reactive to light. Conjunctivae are normal.  Neck: Normal range of motion. No JVD present. No thyromegaly present.  Cardiovascular: Normal rate, regular rhythm, normal heart sounds and intact distal pulses.  Exam reveals no gallop and no friction rub.   No murmur heard. Pulmonary/Chest: Effort normal and breath sounds normal. No respiratory distress. He has no wheezes. He has no rales. He exhibits no tenderness.  Abdominal: Soft. Bowel sounds are normal.  He exhibits no distension and no mass. There is no tenderness. There is no rebound and no guarding.  Musculoskeletal: Normal range of motion. He exhibits no edema or tenderness.  Lymphadenopathy:    He has no cervical adenopathy.  Neurological: He is alert and oriented to person, place, and time. He has normal reflexes. No cranial nerve deficit. He exhibits normal muscle tone. He displays a negative Romberg sign. Coordination abnormal. Gait normal.  Skin: Skin is warm and dry. No rash noted.  Psychiatric: He has a normal mood and affect. His behavior is normal. Judgment and thought content normal.  R hemiparesis Mild R foot drop Stiff R shoulder  Lab Results  Component Value Date   WBC 8.3 05/15/2016   HGB 14.6 05/15/2016   HCT 42.8 05/15/2016   PLT 296.0 05/15/2016   GLUCOSE 124 (H) 11/12/2016   CHOL 83 05/15/2016   TRIG 55.0 05/15/2016   HDL 30.90 (L) 05/15/2016   LDLDIRECT 30.1 11/14/2010   LDLCALC 41 05/15/2016   ALT 19 05/15/2016   AST 15 05/15/2016   NA 140 11/12/2016   K 3.9 11/12/2016   CL 102 11/12/2016   CREATININE 1.02 11/12/2016   BUN 18 11/12/2016   CO2 27 11/12/2016   TSH 1.67 11/08/2012   PSA 0.32 11/14/2010   INR 1.2 (H) 05/15/2016   HGBA1C 6.2 11/12/2016    Ct Head Wo Contrast  Result Date: 01/04/2016 CLINICAL DATA:  Right upper extremity numbness and unsteady gait. History of hypertension. Diabetes. EXAM: CT HEAD WITHOUT CONTRAST TECHNIQUE: Contiguous axial images were obtained from the base of the skull through the vertex without intravenous contrast. COMPARISON:  None. FINDINGS: Brain: Mild low density in the periventricular white matter likely related to small vessel disease. No mass lesion, hemorrhage, hydrocephalus, acute infarct, intra-axial, or extra-axial fluid collection. Vascular: No hyperdense vessel or unexpected calcification. Skull: Normal Sinuses/Orbits: Normal orbits and globes. Right ethmoid air cell mucous retention cyst or polyp. Clear  mastoid air cells. Other: None IMPRESSION: 1.  No acute intracranial abnormality. 2. Mild small vessel ischemic change. Electronically Signed   By: Abigail Miyamoto M.D.   On: 01/04/2016 13:12   Mr Brain Wo Contrast  Result Date: 01/04/2016 CLINICAL DATA:  Acute onset of numbness of the right hand and fingers while exercising. EXAM: MRI HEAD WITHOUT CONTRAST MRA HEAD WITHOUT CONTRAST TECHNIQUE: Multiplanar, multiecho pulse sequences of the brain and surrounding structures were obtained without intravenous contrast. Angiographic images of the head were obtained using MRA technique without contrast. COMPARISON:  CT same day FINDINGS: MRI HEAD FINDINGS Brain: Diffusion imaging shows a sub cm acute infarction at the pontomedullary junction anteriorly just to the left of midline. There is also a small linear white matter acute infarction in the left frontal subcortical white matter. There are mild chronic small-vessel changes of the pons. There are moderate chronic small-vessel ischemic changes of the cerebral hemispheric white matter. No large vessel  territory infarction. No mass lesion, hemorrhage, hydrocephalus or extra-axial collection. Vascular: Major vessels at the base of the brain show flow. Skull and upper cervical spine: Negative Sinuses/Orbits: Clear Other: None significant MRA HEAD FINDINGS Both internal carotid arteries are widely patent into the brain. The anterior and middle cerebral vessels are normal without proximal stenosis, aneurysm or vascular malformation. Fetal origin of the left PCA. Large posterior communicating artery on the right. Both vertebral arteries are patent. The right terminates in PICA. Left vertebral artery supplies the basilar. No basilar stenosis. Posterior circulation branch vessels are patent and normal. 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 Small acute left frontal subcortical white matter infarction. These 2  acute infarctions could be coincidental in due to ordinary small vessel disease, but emboli from the heart or ascending aorta could also result in this. Moderate chronic small-vessel ischemic changes of the cerebral hemispheric white matter for a age. Negative intracranial MR angiography of the large and medium size vessels. Electronically Signed   By: Nelson Chimes M.D.   On: 01/04/2016 17:41   Mr Jodene Nam Head/brain FG Cm  Result Date: 01/04/2016 CLINICAL DATA:  Acute onset of numbness of the right hand and fingers while exercising. EXAM: MRI HEAD WITHOUT CONTRAST MRA HEAD WITHOUT CONTRAST TECHNIQUE: Multiplanar, multiecho pulse sequences of the brain and surrounding structures were obtained without intravenous contrast. Angiographic images of the head were obtained using MRA technique without contrast. COMPARISON:  CT same day FINDINGS: MRI HEAD FINDINGS Brain: Diffusion imaging shows a sub cm acute infarction at the pontomedullary junction anteriorly just to the left of midline. There is also a small linear white matter acute infarction in the left frontal subcortical white matter. There are mild chronic small-vessel changes of the pons. There are moderate chronic small-vessel ischemic changes of the cerebral hemispheric white matter. No large vessel territory infarction. No mass lesion, hemorrhage, hydrocephalus or extra-axial collection. Vascular: Major vessels at the base of the brain show flow. Skull and upper cervical spine: Negative Sinuses/Orbits: Clear Other: None significant MRA HEAD FINDINGS Both internal carotid arteries are widely patent into the brain. The anterior and middle cerebral vessels are normal without proximal stenosis, aneurysm or vascular malformation. Fetal origin of the left PCA. Large posterior communicating artery on the right. Both vertebral arteries are patent. The right terminates in PICA. Left vertebral artery supplies the basilar. No basilar stenosis. Posterior circulation branch  vessels are patent and normal. 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 Small acute left frontal subcortical white matter infarction. These 2 acute infarctions could be coincidental in due to ordinary small vessel disease, but emboli from the heart or ascending aorta could also result in this. Moderate chronic small-vessel ischemic changes of the cerebral hemispheric white matter for a age. Negative intracranial MR angiography of the large and medium size vessels. Electronically Signed   By: Nelson Chimes M.D.   On: 01/04/2016 17:41    Assessment & Plan:   There are no diagnoses linked to this encounter. I have discontinued Mr. Harlin valsartan. I am also having him maintain his Probiotic, Cholecalciferol, sildenafil, blood glucose meter kit and supplies, linagliptin, aspirin, glucose blood, metFORMIN, polyethylene glycol powder, doxazosin, b complex vitamins, triamcinolone cream, and atorvastatin.  No orders of the defined types were placed in this encounter.    Follow-up: No Follow-up on file.  Walker Kehr, MD

## 2016-11-13 NOTE — Assessment & Plan Note (Addendum)
Labs are good Actos, Metformin, Trajenta

## 2016-11-13 NOTE — Assessment & Plan Note (Addendum)
start Exforge

## 2016-11-13 NOTE — Assessment & Plan Note (Signed)
No improvement over the summer 2018 - unable to play golf Sports Med ref

## 2016-11-17 ENCOUNTER — Ambulatory Visit (INDEPENDENT_AMBULATORY_CARE_PROVIDER_SITE_OTHER): Payer: Medicare Other | Admitting: Neurology

## 2016-11-17 ENCOUNTER — Encounter: Payer: Self-pay | Admitting: Neurology

## 2016-11-17 VITALS — BP 140/78 | HR 80 | Ht 70.0 in | Wt 207.6 lb

## 2016-11-17 DIAGNOSIS — I639 Cerebral infarction, unspecified: Secondary | ICD-10-CM | POA: Diagnosis not present

## 2016-11-17 DIAGNOSIS — G8111 Spastic hemiplegia affecting right dominant side: Secondary | ICD-10-CM

## 2016-11-17 MED ORDER — BACLOFEN 10 MG PO TABS: 10.0000 mg | ORAL_TABLET | Freq: Two times a day (BID) | ORAL | 5 refills | Status: DC

## 2016-11-17 NOTE — Progress Notes (Signed)
[facility] Clinic Note - Initial Visit   Date: 11/17/16  Christian Clements MRN: [id number] DOB: 1950-11-22   Dear Dr. Paulla Fore:  Thank you for your kind referral of Christian Clements for consultation of right leg weakness. Although his history is well known to you, please allow [place] to reiterate it for the purpose of our medical record. The patient was accompanied to the clinic by wife who also provides collateral information.     History of Present Illness: Christian Clements is a 66 y.o. 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  US carotids, MRA head, echo, and cardiac monitoring did not reveal abnormalities.  He was started on aspirin 384m as he was not taking any antiplatelet medication prior to presentation.  He completed physical therapy in January 2018.  Since then, he feels that his knee buckles and wife has noticed that when he walks, his right knee snaps back.  He also complains of right low back pain, described as sharp and shooting for the past 6-7 weeks. It localized to his low back and does not radiate into his leg.  It is always exacerbated by activity, such as getting up.   He stays very active and goes to the gym 5-6 times per week as well as swims.     Out-side paper records, electronic medical record, and images have been reviewed where available and summarized as:  Lab Results  Component Value Date   CHOL 83 05/15/2016   HDL 30.90 (L) 05/15/2016   LDLCALC 41 05/15/2016   LDLDIRECT 30.1 11/14/2010   TRIG 55.0 05/15/2016   CHOLHDL 3 05/15/2016    Lab Results  Component Value  Date   HGBA1C 6.2 11/12/2016    MRI/A brain 01/04/2016: Acute sub cm infarction at the ventral pontomedullary junction just to the left of midline. Small acute left frontal subcortical white matter infarction. These 2 acute infarctions could be coincidental in due to ordinary small vessel disease, but emboli from the heart or ascending aorta could also result in this. Moderate chronic small-vessel ischemic changes of the cerebral hemispheric white matter for a age. Negative intracranial MR angiography of the large and medium size vessels.  Cardiac monitor 04/06/2016:  No arrythmia  Echo 01/06/2016:  Left ventricle: The cavity size was normal. Wall thickness was   normal. Systolic function was normal. The estimated ejection fraction was in the range of 55% to 60%.  No defect or patent foramen ovale was identified.  UKoreacarotids 01/05/2016:  1-39% bilateral ICA   Past Medical History:  Diagnosis Date  . Diabetes mellitus   . Hypertension   . Prostatitis 2011    Past Surgical History:  Procedure Laterality Date  . None       Medications:  Outpatient Encounter Prescriptions as of 11/17/2016  Medication Sig  . amLODipine-valsartan (EXFORGE) 5-320 MG tablet Take 1 tablet by mouth daily.  .Marland Kitchenaspirin 325 MG tablet Take 1 tablet (325 mg total) by mouth daily.  .Marland Kitchenatorvastatin (LIPITOR) 20 MG tablet TAKE 1 TABLET (20 MG TOTAL) BY MOUTH DAILY.  .Marland Kitchenb complex vitamins tablet Take 1 tablet by mouth daily.  . blood glucose meter kit and  supplies KIT Dispense based on patient and insurance preference. Use two times daily as directed. (FOR ICD-9 250.00, 250.01).  . Cholecalciferol 1000 UNITS tablet Take 1,000 Units by mouth daily.    Marland Kitchen doxazosin (CARDURA) 2 MG tablet Take 1 tablet (2 mg total) by mouth daily.  Marland Kitchen glucose blood (ONETOUCH VERIO) test strip Use to check blood sugars twice a day Dx E11.9 Yearly physical w/labs are due must see MD for refills  . linagliptin (TRADJENTA) 5 MG TABS tablet  Take 1 tablet (5 mg total) by mouth daily.  . metFORMIN (GLUCOPHAGE) 1000 MG tablet TAKE 1 TABLET (1,000 MG TOTAL) BY MOUTH 2 (TWO) TIMES DAILY WITH A MEAL.  Marland Kitchen polyethylene glycol powder (GLYCOLAX/MIRALAX) powder Take 17 g by mouth 2 (two) times daily as needed for moderate constipation.  Marland Kitchen PROBIOTIC CAPS Take 1 capsule by mouth daily.    . sildenafil (VIAGRA) 100 MG tablet Take 1 tablet (100 mg total) by mouth daily as needed.  . triamcinolone cream (KENALOG) 0.1 % Apply 1 application topically 2 (two) times daily.  . baclofen (LIORESAL) 10 MG tablet Take 1 tablet (10 mg total) by mouth 2 (two) times daily.   No facility-administered encounter medications on file as of 11/17/2016.      Allergies:  Allergies  Allergen Reactions  . Accuretic [Quinapril-Hydrochlorothiazide]     Drug rash ?    Family History: Family History  Problem Relation Age of Onset  . Cancer Mother 71       leukemia  . Pancreatic cancer Father   . Hypertension Other   . Diabetes Maternal Grandfather   . Stroke Sister   . Cancer Brother   . HIV/AIDS Brother     Social History: Social History  Substance Use Topics  . Smoking status: Former Research scientist (life sciences)  . Smokeless tobacco: Never Used  . Alcohol use 1.8 oz/week    3 Glasses of wine per week     Comment: Socially   Social History   Social History Narrative   Regular exercise- yes.  Lives with wife in a 2 story home.  Has no children.     Retired Careers information officer.     Education: college.           Review of Systems:  CONSTITUTIONAL: No fevers, chills, night sweats, or weight loss.   EYES: No visual changes or eye pain ENT: No hearing changes.  No history of nose bleeds.   RESPIRATORY: No cough, wheezing and shortness of breath.   CARDIOVASCULAR: Negative for chest pain, and palpitations.   GI: Negative for abdominal discomfort, blood in stools or black stools.  No recent change in bowel habits.   GU:  No history of incontinence.   MUSCLOSKELETAL: +history  of joint pain or swelling.  No myalgias.  + low back pain SKIN: Negative for lesions, rash, and itching.   HEMATOLOGY/ONCOLOGY: Negative for prolonged bleeding, bruising easily, and swollen nodes.  No history of cancer.   ENDOCRINE: Negative for cold or heat intolerance, polydipsia or goiter.   PSYCH:  No depression or anxiety symptoms.   NEURO: As Above.   Vital Signs:  BP 140/78   Pulse 80   Ht 5' 10"  (1.778 m)   Wt 207 lb 9 oz (94.1 kg)   SpO2 97%   BMI 29.78 kg/m    General Medical Exam:   General:  Well appearing, comfortable.   Eyes/ENT: see cranial nerve examination.   Neck: No masses appreciated.  Full range of motion  without tenderness.  No carotid bruits. Respiratory:  Clear to auscultation, good air entry bilaterally.   Cardiac:  Regular rate and rhythm, no murmur.   Extremities:  No deformities, edema, or skin discoloration.  Skin:  No rashes or lesions.  Neurological Exam: MENTAL STATUS including orientation to time, place, person, recent and remote memory, attention span and concentration, language, and fund of knowledge is normal.  Speech is not dysarthric.  CRANIAL NERVES: II:  No visual field defects.  Unremarkable fundi.   III-IV-VI: Pupils equal round and reactive to light.  Normal conjugate, extra-ocular eye movements in all directions of gaze.  No nystagmus.  No ptosis.   V:  Temperature and pin prick is reduced by 80% over the right side of the face.    VII:  Subtle left facial droop and flattening of the nasolabial fold.     VIII:  Normal hearing and vestibular function.   IX-X:  Normal palatal movement.   XI:  Normal shoulder shrug and head rotation.   XII:  Normal tongue strength and range of motion, no deviation or fasciculation.  MOTOR:  No atrophy, fasciculations or abnormal movements.  No pronator drift.     Right Upper Extremity:    Left Upper Extremity:    Deltoid  5/5   Deltoid  5/5   Biceps  5/5   Biceps  5/5   Triceps  5/5   Triceps  5/5     Wrist extensors  5/5   Wrist extensors  5/5   Wrist flexors  5/5   Wrist flexors  5/5   Finger extensors  5/5   Finger extensors  5/5   Finger flexors  5/5   Finger flexors  5/5   Dorsal interossei  4/5   Dorsal interossei  5/5   Abductor pollicis  5/5   Abductor pollicis  5/5   Tone (Ashworth scale)  0+  Tone (Ashworth scale)  0   Right Lower Extremity:    Left Lower Extremity:    Hip flexors  5/5   Hip flexors  5/5   Hip extensors  5/5   Hip extensors  5/5   Knee flexors  5/5   Knee flexors  5/5   Knee extensors  5/5   Knee extensors  5/5   Dorsiflexors  5/5   Dorsiflexors  5/5   Plantarflexors  5/5   Plantarflexors  5/5   Toe extensors  5-/5   Toe extensors  5/5   Toe flexors  5-/5   Toe flexors  5/5   Tone (Ashworth scale)  1  Tone (Ashworth scale)  0   MSRs:  Right                                                                 Left brachioradialis 2+  brachioradialis 2+  biceps 2+  biceps 2+  triceps 2+  triceps 2+  patellar 2+  patellar 2+  ankle jerk 2+  ankle jerk 2+  Hoffman no  Hoffman no  plantar response down  plantar response down   SENSORY:  Sensation to all modalities is reduced to 80% on the right arm and leg.     COORDINATION/GAIT: Mild dysmetria with right finger-to- nose-finger.  Imprecision with right finger tapping.  Able to rise from a chair without using arms.  Gait shows hyperextension of the right knee and slight spastic gait.  He is able to walk on toes, unable to walk on right heel. Unsteady with tandem gait.   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  I reviewed patients work-up which did not disclose etiology for his stroke.  Given two vascular territories, cardioembolic source is suspected however his cardiac monitoring was normal.  If he  has future events, he may need implantable loop recorder.   His exam shows right leg spasticity, knee hyperextension, and mild spastic gait which explains his symptoms of gait difficulty.  I counseled the patient and his wife that he is is now experiencing late effects of stroke and how to manage these.  For his spasticity, I will start him on baclofen 80m BID and titrate to 19mBID.  I have also asked him to start a stretching program specifically targeting his hamstrings and calf muscles.  He will call the office if he chooses to start physical therapy for this.    In the meantime, continue secondary stroke prevention with aspirin 3255maily.  I applauded patient for his strict control of diabetes (HbA1c down from 9 >6).  His blood pressure is 140/78 with the goal being < 130/80 and encouraged him to work with his PCP on this.  Cholesterol is well controlled on lipitor 79m63mily.   Return to clinic in 4 months.   The duration of this appointment visit was 45 minutes of face-to-face time with the patient.  Greater than 50% of this time was spent in counseling, explanation of diagnosis, planning of further management, and coordination of care.   Thank you for allowing me to participate in patient's care.  If I can answer any additional questions, I would be pleased to do so.    Sincerely,    Donika K. PatePosey Pronto

## 2016-11-17 NOTE — Patient Instructions (Addendum)
Start a stretching program to stretch the thighs and posterior legs  Start Baclofen 10 mg tablets    Morning       Evening  Week 1 1/2               1/2 tab              Week 2 1/2 tab         1 tab                      Continue 1 tab         1 tab                          Call my office if you would like a referral to physical therapy for leg stretching  Return to clinic in 4 months

## 2016-11-26 NOTE — Progress Notes (Signed)
[facility] [address], [place] [address] Phone: [phone] Subjective:    I'm seeing this patient by the request  of:    CC: Right sided weakness.  [id number]  Christian Clements is a 66 y.o. male coming in with complaint of right-sided weakness. Patient did have an acute cerebrovascular accident in October of last year. Patient did do in 2 months or rehabilitation and then was dismissed. Patient since then he feels like he plateaued. Has not made any significant improvement over the course of time now.  States mild pain on the leg can be in the groin as well. Concern due to poor balance as well.      Past Medical History:  Diagnosis Date  . Diabetes mellitus   . Hypertension   . Prostatitis 2011   Past Surgical History:  Procedure Laterality Date  . None     Social History   Social History  . Marital status: Married    Spouse name: N/A  . Number of children: N/A  . Years of education: N/A   Occupational History  . PE teacher [employer]   Social History Main Topics  . Smoking status: Former Research scientist (life sciences)  . Smokeless tobacco: Never Used  . Alcohol use 1.8 oz/week    3 Glasses of wine per week     Comment: Socially  . Drug use: No  . Sexual activity: Yes   Other Topics Concern  . None   Social History Narrative   Regular exercise- yes.  Lives with wife in a 2 story home.  Has no children.     Retired Careers information officer.     Education: college.          Allergies  Allergen Reactions  . Accuretic [Quinapril-Hydrochlorothiazide]     Drug rash ?   Family History  Problem Relation Age of Onset  . Cancer Mother 79       leukemia  . Pancreatic cancer Father   . Hypertension Other   . Diabetes Maternal Grandfather   . Stroke Sister   . Cancer Brother   . HIV/AIDS Brother      Past medical history, social, surgical and family history all reviewed in electronic medical record.  No pertanent information unless  stated regarding to the chief complaint.   Review of Systems:Review of systems updated and as accurate as of 11/27/16  No headache, visual changes, nausea, vomiting, diarrhea, constipation, dizziness, abdominal pain, skin rash, fevers, chills, night sweats, weight loss, swollen lymph nodes, body aches, joint swelling,  chest pain, shortness of breath, mood changes.  Positive muscle aches  Objective  Blood pressure (!) 148/82, pulse 80, height 5\' 10"  (1.778 m), weight 211 lb (95.7 kg), SpO2 96 %. Systems examined below as of 11/27/16   General: No apparent distress alert and oriented x3 mood and affect normal, dressed appropriately.  HEENT: Pupils equal, extraocular movements intact  Respiratory: Patient's speak in full sentences and does not appear short of breath  Cardiovascular: No lower extremity edema, non tender, no erythema  Skin: Warm dry intact with no signs of infection or rash on extremities or on axial skeleton.  Abdomen: Soft nontender  Neuro: Cranial nerves II through XII are intact, neurovascularly intact in all extremities with 2+ DTRs and 2+ pulses.  Lymph: No lymphadenopathy of posterior or anterior cervical chain or axillae bilaterally.  Gait normal with good balance and coordination.  MSK:  Patient does have strength with all left  side extremities. Patient's right side does show 4-5 of the upper extremity and 4/5 compared to the other side.  Right hip decrease ROM of 10 degrees internal range of motion.    Weakness of hip flexor right compared to contralateral sign. Patient though does have good strength at the ankle.    [id number]; 15 additional minutes spent for Therapeutic exercises as stated in above notes.  This included exercises focusing on stretching, strengthening, with significant focus on eccentric aspects.   Long term goals include an improvement in range of motion, strength, endurance as well as avoiding reinjury. Patient's frequency would include in 1-2 times a  day, 3-5 times a week for a duration of 6-12 weeks. Hip strengthening exercises which included:  Pelvic tilt/bracing to help with proper recruitment of the lower abs and pelvic floor muscles  Glute strengthening to properly contract glutes without over-engaging low back and hamstrings - prone hip extension and glute bridge exercises Proper stretching techniques to increase effectiveness for the hip flexors, groin, quads, piriformic and low back when appropriate     Proper technique shown and discussed handout in great detail with ATC.  All questions were discussed and answered.     Impression and Recommendations:     This case required medical decision making of moderate complexity.      Note: This dictation was prepared with Dragon dictation along with smaller phrase technology. Any transcriptional errors that result from this process are unintentional.

## 2016-11-27 ENCOUNTER — Encounter: Payer: Self-pay | Admitting: Family Medicine

## 2016-11-27 ENCOUNTER — Ambulatory Visit (INDEPENDENT_AMBULATORY_CARE_PROVIDER_SITE_OTHER): Payer: Medicare Other | Admitting: Family Medicine

## 2016-11-27 ENCOUNTER — Ambulatory Visit (INDEPENDENT_AMBULATORY_CARE_PROVIDER_SITE_OTHER)
Admission: RE | Admit: 2016-11-27 | Discharge: 2016-11-27 | Disposition: A | Discharge status: Home / Self Care | Payer: Medicare Other | Source: Ambulatory Visit | Attending: Family Medicine | Admitting: Family Medicine

## 2016-11-27 VITALS — BP 148/82 | HR 80 | Ht 70.0 in | Wt 211.0 lb

## 2016-11-27 DIAGNOSIS — R531 Weakness: Secondary | ICD-10-CM | POA: Diagnosis not present

## 2016-11-27 DIAGNOSIS — M25551 Pain in right hip: Secondary | ICD-10-CM

## 2016-11-27 MED ORDER — VITAMIN D (ERGOCALCIFEROL) 1.25 MG (50000 UNIT) PO CAPS: 50000.0000 [IU] | ORAL_CAPSULE | ORAL | 0 refills | Status: DC

## 2016-11-27 NOTE — Assessment & Plan Note (Signed)
Patient does have weakness on the right side of his body. Patient's hip flexors seem to be weak but does have decreasing internal range of motion of the hip. X-rays are pending to further evaluate for possible arthritis secondary contribute in. We discussed icing regimen, work with Product/process development scientist to learn home exercises, once weekly vitamin D for strength and conditioning. Patient will start increasing activity slowly over the course of time. Patient will come back and see me again in 4 weeks.

## 2016-11-27 NOTE — Patient Instructions (Signed)
Good to see you  Ice is your friend when pain  Once weekly vitamin D for 12 weeks.  B12 1043mcg daily  B6 200mg  daily  Tart cherry extract any dose at night Stay active.  Exercises 3 times a week.  OK to go to a driving range but work on [employer].  Xray of hip downstairs See me again in 3 weeks

## 2016-12-03 ENCOUNTER — Encounter: Payer: Self-pay | Admitting: Endocrinology

## 2016-12-03 ENCOUNTER — Ambulatory Visit (INDEPENDENT_AMBULATORY_CARE_PROVIDER_SITE_OTHER): Payer: Medicare Other | Admitting: Endocrinology

## 2016-12-03 VITALS — BP 138/82 | HR 60 | Wt 210.2 lb

## 2016-12-03 DIAGNOSIS — E1159 Type 2 diabetes mellitus with other circulatory complications: Secondary | ICD-10-CM | POA: Diagnosis not present

## 2016-12-03 NOTE — Progress Notes (Signed)
Subjective:    Patient ID: Christian Clements, male    DOB: 1950-10-14, 66 y.o.   MRN: [id number]  HPI Pt returns for f/u of diabetes mellitus: DM type: 2 Dx'ed: 2248 Complications: CVA Therapy: 2 oral meds.  DKA: never Severe hypoglycemia: never.  Pancreatitis: never.  Other: he took insulin for 1 month, in 2017.   Interval history: he says cbg's are well-controlled.  There is no trend throughout the day.  pt states she feels well in general.    Past Medical History:  Diagnosis Date  . Diabetes mellitus   . Hypertension   . Prostatitis 2011    Past Surgical History:  Procedure Laterality Date  . None      Social History   Social History  . Marital status: Married    Spouse name: N/A  . Number of children: N/A  . Years of education: N/A   Occupational History  . 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 Smoking status: Former Research scientist (life sciences)  . Smokeless tobacco: Never Used  . Alcohol use 1.8 oz/week    3 Glasses of wine per week     Comment: Socially  . Drug use: No  . Sexual activity: Yes   Other Topics Concern  . Not on file   Social History Narrative   Regular exercise- yes.  Lives with wife in a 2 story home.  Has no children.     Retired Careers information officer.     Education: college.           Current Outpatient Prescriptions on File Prior to Visit  Medication Sig Dispense Refill  . amLODipine-valsartan (EXFORGE) 5-320 MG tablet Take 1 tablet by mouth daily. 90 tablet 3  . aspirin 325 MG tablet Take 1 tablet (325 mg total) by mouth daily. 100 tablet 3  . atorvastatin (LIPITOR) 20 MG tablet TAKE 1 TABLET (20 MG TOTAL) BY MOUTH DAILY. 90 tablet 1  . b complex vitamins tablet Take 1 tablet by mouth daily. 100 tablet 3  . baclofen (LIORESAL) 10 MG tablet Take 1 tablet (10 mg total) by mouth 2 (two) times daily. 60 each 5  . blood glucose meter kit and supplies KIT Dispense based on patient and insurance preference. Use two times daily as  directed. (FOR ICD-9 250.00, 250.01). 1 each 0  . Cholecalciferol 1000 UNITS tablet Take 1,000 Units by mouth daily.      Marland Kitchen doxazosin (CARDURA) 2 MG tablet Take 1 tablet (2 mg total) by mouth daily. 30 tablet 11  . glucose blood (ONETOUCH VERIO) test strip Use to check blood sugars twice a day Dx E11.9 Yearly physical w/labs are due must see MD for refills 100 each 0  . linagliptin (TRADJENTA) 5 MG TABS tablet Take 1 tablet (5 mg total) by mouth daily. 30 tablet 11  . metFORMIN (GLUCOPHAGE) 1000 MG tablet TAKE 1 TABLET (1,000 MG TOTAL) BY MOUTH 2 (TWO) TIMES DAILY WITH A MEAL. 180 tablet 3  . polyethylene glycol powder (GLYCOLAX/MIRALAX) powder Take 17 g by mouth 2 (two) times daily as needed for moderate constipation. 500 g 11  . PROBIOTIC CAPS Take 1 capsule by mouth daily.      . sildenafil (VIAGRA) 100 MG tablet Take 1 tablet (100 mg total) by mouth daily as needed. 10 tablet 11  . triamcinolone cream (KENALOG) 0.1 % Apply 1 application topically 2 (two) times daily. 80 g 3  . Vitamin D, Ergocalciferol, (DRISDOL) 50000 units CAPS  capsule Take 1 capsule (50,000 Units total) by mouth every 7 (seven) days. 12 capsule 0   No current facility-administered medications on file prior to visit.     Allergies  Allergen Reactions  . Accuretic [Quinapril-Hydrochlorothiazide]     Drug rash ?    Family History  Problem Relation Age of Onset  . Cancer Mother 58       leukemia  . Pancreatic cancer Father   . Hypertension Other   . Diabetes Maternal Grandfather   . Stroke Sister   . Cancer Brother   . HIV/AIDS Brother     BP 138/82   Pulse 60   Wt 210 lb 3.2 oz (95.3 kg)   SpO2 97%   BMI 30.16 kg/m    Review of Systems He denies hypoglycemia.     Objective:   Physical Exam VITAL SIGNS:  See vs page GENERAL: no distress Pulses: foot pulses are intact bilaterally.   MSK: no deformity of the feet or ankles.  CV: no edema of the legs or ankles Skin:  no ulcer on the feet or ankles.   normal color and temp on the feet and ankles Neuro: sensation is intact to touch on the feet and ankles.   Ext: There is bilateral onychomycosis of the toenails. There is ecchymosis under the left great toenail.   Lab Results  Component Value Date   HGBA1C 6.2 11/12/2016       Assessment & Plan:  Type 2 DM: well-controlled CVA: in this context, he should avoid hypoglycemia.  Patient Instructions  check your blood sugar once a day.  vary the time of day when you check, between before the 3 meals, and at bedtime.  also check if you have symptoms of your blood sugar being too high or too low.  please keep a record of the readings and bring it to your next appointment here (or you can bring the meter itself).  You can write it on any piece of paper.  please call us sooner if your blood sugar goes below 70, or if you have a lot of readings over 200. Please continue the same medications.  Please come back for a follow-up appointment in 6 months.

## 2016-12-03 NOTE — Patient Instructions (Signed)

## 2016-12-19 ENCOUNTER — Encounter: Payer: Self-pay | Admitting: Family Medicine

## 2016-12-19 ENCOUNTER — Ambulatory Visit (INDEPENDENT_AMBULATORY_CARE_PROVIDER_SITE_OTHER): Payer: Medicare Other | Admitting: Family Medicine

## 2016-12-19 DIAGNOSIS — R531 Weakness: Secondary | ICD-10-CM

## 2016-12-19 MED ORDER — GABAPENTIN 100 MG PO CAPS: 200.0000 mg | ORAL_CAPSULE | Freq: Every day | ORAL | 3 refills | Status: DC

## 2016-12-19 NOTE — Progress Notes (Signed)
[facility] Christian Clements, [place] [address] Phone: [phone] Subjective:     CC: Right sided weakness and pain  ENI:[id number]  Christian Clements is a 66 y.o. male coming in with complaint of right-sided weakness and hip pain. Past medical history was significant for an acute cerebrovascular accident approximately 1 year ago. Unfortunately has residual weakness on the right side of his body since then. Patient did have significant decrease in range of motion as well as weakness of the right hip. Patient did have x-rays done. X-rays were independently visualized by me. X-rays did not show any significant bony abnormality. Patient was to start home exercises, over-the-counter medications, as well as prescription dose of vitamin D. Patient states Doing much better overall. Patient states still making progress. Very slowly. Patient still having some weakness overall. Patient has not played golf. It is looking forward to it.      Past Medical History:  Diagnosis Date  . Diabetes mellitus   . Hypertension   . Prostatitis 2011   Past Surgical History:  Procedure Laterality Date  . None     Social History   Social History  . Marital status: Married    Spouse name: N/A  . Number of children: N/A  . Years of education: N/A   Occupational History  . PE teacher [employer]   Social History Main Topics  . Smoking status: Former Research scientist (life sciences)  . Smokeless tobacco: Never Used  . Alcohol use 1.8 oz/week    3 Glasses of wine per week     Comment: Socially  . Drug use: No  . Sexual activity: Yes   Other Topics Concern  . None   Social History Narrative   Regular exercise- yes.  Lives with wife in a 2 story home.  Has no children.     Retired Careers information officer.     Education: college.          Allergies  Allergen Reactions  . Accuretic [Quinapril-Hydrochlorothiazide]     Drug rash ?   Family History  Problem Relation Age of Onset  . Cancer  Mother 68       leukemia  . Pancreatic cancer Father   . Hypertension Other   . Diabetes Maternal Grandfather   . Stroke Sister   . Cancer Brother   . HIV/AIDS Brother      Past medical history, social, surgical and family history all reviewed in electronic medical record.  No pertanent information unless stated regarding to the chief complaint.   Review of Systems:Review of systems updated and as accurate as of 12/19/16  No headache, visual changes, nausea, vomiting, diarrhea, constipation, dizziness, abdominal pain, skin rash, fevers, chills, night sweats, weight loss, swollen lymph nodes, body aches, joint swelling, muscle aches, chest pain, shortness of breath, mood changes.   Objective  Blood pressure 140/80, pulse 69, height 5\' 10"  (1.778 m), weight 209 lb (94.8 kg), SpO2 98 %. Systems examined below as of 12/19/16   General: No apparent distress alert and oriented x3 mood and affect normal, dressed appropriately.  HEENT: Pupils equal, extraocular movements intact  Respiratory: Patient's speak in full sentences and does not appear short of breath  Cardiovascular: No lower extremity edema, non tender, no erythema  Skin: Warm dry intact with no signs of infection or rash on extremities or on axial skeleton.  Abdomen: Soft nontender  Neuro: Cranial nerves II through XII are intact, neurovascularly intact in all extremities with 2+  DTRs and 2+ pulses.  Lymph: No lymphadenopathy of posterior or anterior cervical chain or axillae bilaterally.  Gait Very mild antalgic gait MSK:  Non tender with full range of motion and good stability and symmetric strength and tone of shoulders, elbows, wrist, hip, knee and ankles bilaterally. 4+ out of 5 strength of the right upper and lower extremity compared to the contralateral side. Patient does have 4/5 dorsiflexion     Impression and Recommendations:     This case required medical decision making of moderate complexity.      Note: This  dictation was prepared with Dragon dictation along with smaller phrase technology. Any transcriptional errors that result from this process are unintentional.

## 2016-12-19 NOTE — Assessment & Plan Note (Signed)
Patient is doing relatively well with right-sided weakness at this time. Continue the vitamin supplementation. Inserted a very low dose gabapentin to see if it be beneficial as well. No signs of any diabetic peripheral neuropathy with a bean unilateral. Patient will see me again in 2 months as long as patient continues to improve. Likely need to start with core strengthening in the long run.

## 2016-12-19 NOTE — Patient Instructions (Signed)
Good to see you  Overall you are making some progress.  I am happy with this and you should be too.  Gabapentin 200mg  at night Stay active Enjoy the golf See me again in 2 months

## 2017-01-15 ENCOUNTER — Other Ambulatory Visit: Payer: Self-pay | Admitting: Endocrinology

## 2017-01-28 ENCOUNTER — Ambulatory Visit: Payer: Medicare Other | Admitting: Neurology

## 2017-02-13 ENCOUNTER — Other Ambulatory Visit: Payer: Self-pay | Admitting: Internal Medicine

## 2017-02-20 ENCOUNTER — Ambulatory Visit: Payer: Medicare Other | Admitting: Family Medicine

## 2017-03-02 ENCOUNTER — Other Ambulatory Visit: Payer: Self-pay | Admitting: Internal Medicine

## 2017-03-11 ENCOUNTER — Encounter: Payer: Self-pay | Admitting: Neurology

## 2017-03-11 ENCOUNTER — Other Ambulatory Visit (INDEPENDENT_AMBULATORY_CARE_PROVIDER_SITE_OTHER): Payer: Medicare Other

## 2017-03-11 ENCOUNTER — Ambulatory Visit: Payer: Medicare Other | Admitting: Neurology

## 2017-03-11 VITALS — BP 120/84 | HR 75 | Ht 70.0 in | Wt 208.1 lb

## 2017-03-11 DIAGNOSIS — G8111 Spastic hemiplegia affecting right dominant side: Secondary | ICD-10-CM | POA: Diagnosis not present

## 2017-03-11 DIAGNOSIS — E1159 Type 2 diabetes mellitus with other circulatory complications: Secondary | ICD-10-CM

## 2017-03-11 LAB — BASIC METABOLIC PANEL
BUN: 13 mg/dL (ref 6–23)
CO2: 26 meq/L (ref 19–32)
CREATININE: 0.92 mg/dL (ref 0.40–1.50)
Calcium: 9.1 mg/dL (ref 8.4–10.5)
Chloride: 107 mEq/L (ref 96–112)
GFR: 105.54 mL/min (ref >60.00)
Glucose, Bld: 110 mg/dL — ABNORMAL HIGH (ref 70–99)
Potassium: 4.3 mEq/L (ref 3.5–5.1)
Sodium: 141 mEq/L (ref 135–145)

## 2017-03-11 LAB — HEMOGLOBIN A1C: Hgb A1c MFr Bld: 6.2 % (ref 4.6–6.5)

## 2017-03-11 NOTE — Progress Notes (Signed)
Follow-up Visit   Date: 03/11/17    Panfilo Ketchum MRN: [id number] DOB: 09-Jan-1951   Interim History: Christian Clements is a 66 y.o. 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  The patient was accompanied to the clinic by wife who also provides collateral information.    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  US carotids, MRA head, echo, and cardiac monitoring did not reveal abnormalities.  He was started on aspirin '325mg'$  as he was not taking any antiplatelet medication prior to presentation.  He completed physical therapy in January 2018.  Since then, he feels that his knee buckles and wife has noticed that when he walks, his right knee snaps back.  He also complains of right low back pain, described as sharp and shooting for the past 6-7 weeks. It localized to his low back and does not radiate into his leg.  It is always exacerbated by activity, such as getting up.   He stays very active and goes to the gym 5-6 times per week as well as swims.    UPDATE 03/11/2017:  He is here for follow-up visit and has been very compliant with stretching before and after exercises.  He is tolerating balcofen '10mg'$  BID well, but stopped it a few days ago, to see if it was helping and has noticed that gait is getting worse again. Wife states that his gait is not as slow as it used to be and leg does not snap back as much, especially when taking baclofen. No new neurological complaints.   Medications:  Current Outpatient Medications on File Prior to Visit  Medication Sig Dispense Refill  . amLODipine-valsartan (EXFORGE) 5-320 MG tablet Take 1 tablet by  mouth daily. 90 tablet 3  . aspirin 325 MG tablet Take 1 tablet (325 mg total) by mouth daily. 100 tablet 3  . atorvastatin (LIPITOR) 20 MG tablet TAKE 1 TABLET (20 MG TOTAL) BY MOUTH DAILY. 90 tablet 1  . b complex vitamins tablet Take 1 tablet by mouth daily. 100 tablet 3  . baclofen (LIORESAL) 10 MG tablet Take 1 tablet (10 mg total) by mouth 2 (two) times daily. 60 each 5  . blood glucose meter kit and supplies KIT Dispense based on patient and insurance preference. Use two times daily as directed. (FOR ICD-9 250.00, 250.01). 1 each 0  . Cholecalciferol 1000 UNITS tablet Take 1,000 Units by mouth daily.      Marland Kitchen doxazosin (CARDURA) 2 MG tablet Take 1 tablet (2 mg total) by mouth daily. 30 tablet 11  . gabapentin (NEURONTIN) 100 MG capsule Take 2 capsules (200 mg total) by mouth at bedtime. 60 capsule 3  . glucose blood (ONETOUCH VERIO) test strip Use to check blood sugars twice a day Dx E11.9 Yearly physical w/labs are due must see MD for refills 100 each 0  . metFORMIN (GLUCOPHAGE) 1000 MG tablet TAKE 1 TABLET (1,000 MG TOTAL) BY MOUTH 2 (TWO) TIMES DAILY WITH A MEAL. 180 tablet 3  . polyethylene glycol powder (GLYCOLAX/MIRALAX) powder Take 17 g by mouth 2 (two) times daily as needed for moderate constipation. 500 g 11  . PROBIOTIC CAPS Take 1 capsule by mouth daily.      Marland Kitchen  sildenafil (VIAGRA) 100 MG tablet Take 1 tablet (100 mg total) by mouth daily as needed. 10 tablet 11  . TRADJENTA 5 MG TABS tablet TAKE 1 TABLET (5 MG TOTAL) BY MOUTH DAILY. **PT NEEDS APPOINTMENT FOR ADDITIONAL REFILLS** 30 tablet 0  . triamcinolone cream (KENALOG) 0.1 % Apply 1 application topically 2 (two) times daily. 80 g 3  . Vitamin D, Ergocalciferol, (DRISDOL) 50000 units CAPS capsule Take 1 capsule (50,000 Units total) by mouth every 7 (seven) days. 12 capsule 0   No current facility-administered medications on file prior to visit.     Allergies:  Allergies  Allergen Reactions  . Accuretic  [Quinapril-Hydrochlorothiazide]     Drug rash ?    Review of Systems:  CONSTITUTIONAL: No fevers, chills, night sweats, or weight loss.  EYES: No visual changes or eye pain ENT: No hearing changes.  No history of nose bleeds.   RESPIRATORY: No cough, wheezing and shortness of breath.   CARDIOVASCULAR: Negative for chest pain, and palpitations.   GI: Negative for abdominal discomfort, blood in stools or black stools.  No recent change in bowel habits.   GU:  No history of incontinence.   MUSCLOSKELETAL: No history of joint pain or swelling.  No myalgias.   SKIN: Negative for lesions, rash, and itching.   ENDOCRINE: Negative for cold or heat intolerance, polydipsia or goiter.   PSYCH:  No depression or anxiety symptoms.   NEURO: As Above.   Vital Signs:  BP 120/84   Pulse 75   Ht '5\' 10"'$  (1.778 m)   Wt 208 lb 2 oz (94.4 kg)   SpO2 96%   BMI 29.86 kg/m  Pain Scale: 0 on a scale of 0-10   General: Well appearing, comfortable  Neurological Exam: MENTAL STATUS including orientation to time, place, person, recent and remote memory, attention span and concentration, language, and fund of knowledge is normal.  Speech is not dysarthric.  CRANIAL NERVES:  Face is symmetric. Palate elevates symmetrically.  Tongue is midline.  MOTOR:  Motor strength is 5/5 in all extremities, except right intrinsic hand muscles are 4/5.  No atrophy, fasciculations or abnormal movements.  No pronator drift.  Tone is 0+ (improved) in the RLE.    MSRs:  Reflexes are 2+/4 throughout  COORDINATION/GAIT:  Gait shows mild hyperextension of the right knee with spasticity (slightly improved)/  Data: MRI/A brain 01/04/2016: Acute sub cm infarction at the ventral pontomedullary junction just to the left of midline. Small acute left frontal subcortical white matter infarction. These 2 acute infarctions could be coincidental in due to ordinary small vessel disease, but emboli from the heart or ascending aorta  could also result in this. Moderate chronic small-vessel ischemic changes of the cerebral hemispheric white matter for a age. Negative intracranial MR angiography of the large and medium size vessels.  Cardiac monitor 04/06/2016:  No arrythmia  Echo 01/06/2016:  Left ventricle: The cavity size was normal. Wall thickness was normal. Systolic function was normal. The estimated ejectionfraction was in the range of 55% to 60%.  No defect or patent foramen ovale was identified.  US carotids 01/05/2016:  1-39% bilateral ICA   IMPRESSION/PLAN: 1.  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 He continues to have have mild hyperextension of the left knee and I encouraged him to restart baclofen '10mg'$  twice daily which helped.  He may also titrate to '10mg'$  TID, if needed.  He is very compliant with leg stretching regimen and  stays active (retired Careers information officer)  2.  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  Given two vascular territories, cardioembolic source is suspected however his cardiac monitoring was normal.  If he has future events, he may need implantable loop recorder.  Continue secondary prevention with aspirin '325mg'$  daily and lipitor '20mg'$  (LDL 41).  Blood pressure and diabetes (HbA1c 6.20 is well-controlled.  Return to clinic in 9 months   Thank you for allowing me to participate in patient's care.  If I can answer any additional questions, I would be pleased to do so.    Sincerely,    Roselynne Lortz K. Posey Pronto, DO

## 2017-03-11 NOTE — Patient Instructions (Signed)
Continue your leg stretches Continue baclofen 10mg  twice daily  Return to clinic in 9 months

## 2017-03-12 ENCOUNTER — Other Ambulatory Visit: Payer: Self-pay | Admitting: Internal Medicine

## 2017-03-12 ENCOUNTER — Encounter: Payer: Self-pay | Admitting: Internal Medicine

## 2017-03-12 ENCOUNTER — Ambulatory Visit: Payer: Medicare Other | Admitting: Internal Medicine

## 2017-03-12 VITALS — BP 150/78 | HR 68 | Temp 97.7°F | Wt 211.0 lb

## 2017-03-12 DIAGNOSIS — Z0000 Encounter for general adult medical examination without abnormal findings: Secondary | ICD-10-CM

## 2017-03-12 DIAGNOSIS — E1159 Type 2 diabetes mellitus with other circulatory complications: Secondary | ICD-10-CM

## 2017-03-12 DIAGNOSIS — H00019 Hordeolum externum unspecified eye, unspecified eyelid: Secondary | ICD-10-CM | POA: Insufficient documentation

## 2017-03-12 DIAGNOSIS — I639 Cerebral infarction, unspecified: Secondary | ICD-10-CM

## 2017-03-12 DIAGNOSIS — E785 Hyperlipidemia, unspecified: Secondary | ICD-10-CM | POA: Diagnosis not present

## 2017-03-12 DIAGNOSIS — E1169 Type 2 diabetes mellitus with other specified complication: Secondary | ICD-10-CM

## 2017-03-12 DIAGNOSIS — H00011 Hordeolum externum right upper eyelid: Secondary | ICD-10-CM

## 2017-03-12 DIAGNOSIS — I10 Essential (primary) hypertension: Secondary | ICD-10-CM | POA: Diagnosis not present

## 2017-03-12 MED ORDER — CEPHALEXIN 500 MG PO CAPS: 500.0000 mg | ORAL_CAPSULE | Freq: Four times a day (QID) | ORAL | 0 refills | Status: DC

## 2017-03-12 NOTE — Assessment & Plan Note (Signed)
Keflex

## 2017-03-12 NOTE — Assessment & Plan Note (Signed)
Metformin, Tradjenta

## 2017-03-12 NOTE — Progress Notes (Signed)
Subjective:  Patient ID: Denman Pichardo, male    DOB: 01-31-1951  Age: 66 y.o. MRN: [id number]  CC: No chief complaint on file.   HPI Anthany Thornhill presents for HTN, CVA, DM f/u  Outpatient Medications Prior to Visit  Medication Sig Dispense Refill  . amLODipine-valsartan (EXFORGE) 5-320 MG tablet Take 1 tablet by mouth daily. 90 tablet 3  . aspirin 325 MG tablet Take 1 tablet (325 mg total) by mouth daily. 100 tablet 3  . atorvastatin (LIPITOR) 20 MG tablet TAKE 1 TABLET (20 MG TOTAL) BY MOUTH DAILY. 90 tablet 1  . b complex vitamins tablet Take 1 tablet by mouth daily. 100 tablet 3  . baclofen (LIORESAL) 10 MG tablet Take 1 tablet (10 mg total) by mouth 2 (two) times daily. 60 each 5  . blood glucose meter kit and supplies KIT Dispense based on patient and insurance preference. Use two times daily as directed. (FOR ICD-9 250.00, 250.01). 1 each 0  . Cholecalciferol 1000 UNITS tablet Take 1,000 Units by mouth daily.      Marland Kitchen doxazosin (CARDURA) 2 MG tablet Take 1 tablet (2 mg total) by mouth daily. 30 tablet 11  . gabapentin (NEURONTIN) 100 MG capsule Take 2 capsules (200 mg total) by mouth at bedtime. 60 capsule 3  . glucose blood (ONETOUCH VERIO) test strip Use to check blood sugars twice a day Dx E11.9 Yearly physical w/labs are due must see MD for refills 100 each 0  . metFORMIN (GLUCOPHAGE) 1000 MG tablet TAKE 1 TABLET (1,000 MG TOTAL) BY MOUTH 2 (TWO) TIMES DAILY WITH A MEAL. 180 tablet 3  . polyethylene glycol powder (GLYCOLAX/MIRALAX) powder Take 17 g by mouth 2 (two) times daily as needed for moderate constipation. 500 g 11  . PROBIOTIC CAPS Take 1 capsule by mouth daily.      . sildenafil (VIAGRA) 100 MG tablet Take 1 tablet (100 mg total) by mouth daily as needed. 10 tablet 11  . TRADJENTA 5 MG TABS tablet TAKE 1 TABLET (5 MG TOTAL) BY MOUTH DAILY. **PT NEEDS APPOINTMENT FOR ADDITIONAL REFILLS** 30 tablet 0  . triamcinolone cream (KENALOG) 0.1 % Apply 1 application topically  2 (two) times daily. 80 g 3  . Vitamin D, Ergocalciferol, (DRISDOL) 50000 units CAPS capsule Take 1 capsule (50,000 Units total) by mouth every 7 (seven) days. 12 capsule 0   No facility-administered medications prior to visit.     ROS Review of Systems  Constitutional: Negative for appetite change, fatigue and unexpected weight change.  HENT: Negative for congestion, nosebleeds, sneezing, sore throat and trouble swallowing.   Eyes: Negative for itching and visual disturbance.  Respiratory: Negative for cough.   Cardiovascular: Negative for chest pain, palpitations and leg swelling.  Gastrointestinal: Negative for abdominal distention, blood in stool, diarrhea and nausea.  Genitourinary: Negative for frequency and hematuria.  Musculoskeletal: Negative for back pain, gait problem, joint swelling and neck pain.  Skin: Negative for rash.  Neurological: Negative for dizziness, tremors, speech difficulty and weakness.  Psychiatric/Behavioral: Negative for agitation, dysphoric mood and sleep disturbance. The patient is not nervous/anxious.     Objective:  BP (!) 150/78   Pulse 68   Temp 97.7 F (36.5 C)   Wt 211 lb (95.7 kg)   SpO2 100%   BMI 30.28 kg/m   BP Readings from Last 3 Encounters:  03/12/17 (!) 150/78  03/11/17 120/84  12/19/16 140/80    Wt Readings from Last 3 Encounters:  03/12/17 211 lb (95.7  kg)  03/11/17 208 lb 2 oz (94.4 kg)  12/19/16 209 lb (94.8 kg)    Physical Exam  Constitutional: He is oriented to person, place, and time. He appears well-developed. No distress.  NAD  HENT:  Mouth/Throat: Oropharynx is clear and moist.  Eyes: Conjunctivae are normal. Pupils are equal, round, and reactive to light.  Neck: Normal range of motion. No JVD present. No thyromegaly present.  Cardiovascular: Normal rate, regular rhythm, normal heart sounds and intact distal pulses. Exam reveals no gallop and no friction rub.  No murmur heard. Pulmonary/Chest: Effort normal  and breath sounds normal. No respiratory distress. He has no wheezes. He has no rales. He exhibits no tenderness.  Abdominal: Soft. Bowel sounds are normal. He exhibits no distension and no mass. There is no tenderness. There is no rebound and no guarding.  Musculoskeletal: Normal range of motion. He exhibits no edema or tenderness.  Lymphadenopathy:    He has no cervical adenopathy.  Neurological: He is alert and oriented to person, place, and time. He has normal reflexes. No cranial nerve deficit. He exhibits normal muscle tone. He displays a negative Romberg sign. Coordination and gait normal.  Skin: Skin is warm and dry. No rash noted.  Psychiatric: He has a normal mood and affect. His behavior is normal. Judgment and thought content normal.  R side minor weakness R eye stye - small  Lab Results  Component Value Date   WBC 8.3 05/15/2016   HGB 14.6 05/15/2016   HCT 42.8 05/15/2016   PLT 296.0 05/15/2016   GLUCOSE 110 (H) 03/11/2017   CHOL 83 05/15/2016   TRIG 55.0 05/15/2016   HDL 30.90 (L) 05/15/2016   LDLDIRECT 30.1 11/14/2010   LDLCALC 41 05/15/2016   ALT 19 05/15/2016   AST 15 05/15/2016   NA 141 03/11/2017   K 4.3 03/11/2017   CL 107 03/11/2017   CREATININE 0.92 03/11/2017   BUN 13 03/11/2017   CO2 26 03/11/2017   TSH 1.67 11/08/2012   PSA 0.32 11/14/2010   INR 1.2 (H) 05/15/2016   HGBA1C 6.2 03/11/2017    Dg Hip Unilat With Pelvis 2-3 Views Right  Result Date: 11/27/2016 CLINICAL DATA:  Right hip pain EXAM: DG HIP (WITH OR WITHOUT PELVIS) 2-3V RIGHT COMPARISON:  10/27/2016 FINDINGS: There is no evidence of hip fracture or dislocation. There is no evidence of arthropathy or other focal bone abnormality. IMPRESSION: Negative. Electronically Signed   By: Franchot Gallo M.D.   On: 11/27/2016 15:58    Assessment & Plan:   There are no diagnoses linked to this encounter. I am having Delfino Lovett maintain his Probiotic, Cholecalciferol, sildenafil, blood glucose meter  kit and supplies, aspirin, glucose blood, polyethylene glycol powder, doxazosin, b complex vitamins, triamcinolone cream, atorvastatin, amLODipine-valsartan, baclofen, Vitamin D (Ergocalciferol), gabapentin, TRADJENTA, and metFORMIN.  No orders of the defined types were placed in this encounter.    Follow-up: No Follow-up on file.  Walker Kehr, MD

## 2017-03-12 NOTE — Assessment & Plan Note (Signed)
No relapse 

## 2017-03-12 NOTE — Assessment & Plan Note (Signed)
BP nl at home Exforge

## 2017-03-17 ENCOUNTER — Other Ambulatory Visit: Payer: Self-pay | Admitting: Endocrinology

## 2017-04-16 ENCOUNTER — Other Ambulatory Visit: Payer: Self-pay | Admitting: Endocrinology

## 2017-05-15 ENCOUNTER — Other Ambulatory Visit: Payer: Self-pay | Admitting: Neurology

## 2017-05-16 ENCOUNTER — Other Ambulatory Visit: Payer: Self-pay | Admitting: Endocrinology

## 2017-05-29 ENCOUNTER — Other Ambulatory Visit: Payer: Self-pay | Admitting: Internal Medicine

## 2017-06-02 ENCOUNTER — Ambulatory Visit: Payer: Medicare Other | Admitting: Endocrinology

## 2017-06-02 ENCOUNTER — Encounter: Payer: Self-pay | Admitting: Endocrinology

## 2017-06-02 VITALS — BP 128/74 | HR 74 | Ht 70.0 in | Wt 213.0 lb

## 2017-06-02 DIAGNOSIS — E1159 Type 2 diabetes mellitus with other circulatory complications: Secondary | ICD-10-CM | POA: Diagnosis not present

## 2017-06-02 LAB — POCT GLYCOSYLATED HEMOGLOBIN (HGB A1C): HEMOGLOBIN A1C: 6.2

## 2017-06-02 MED ORDER — METFORMIN HCL 1000 MG PO TABS: 1000.0000 mg | ORAL_TABLET | Freq: Two times a day (BID) | ORAL | 3 refills | Status: DC

## 2017-06-02 MED ORDER — LINAGLIPTIN 5 MG PO TABS: 5.0000 mg | ORAL_TABLET | Freq: Every day | ORAL | 11 refills | Status: DC

## 2017-06-02 NOTE — Patient Instructions (Signed)

## 2017-06-02 NOTE — Progress Notes (Signed)
Subjective:    Patient ID: Christian Clements, male    DOB: November 18, 1950, 67 y.o.   MRN: [id number]  HPI Pt returns for f/u of diabetes mellitus: DM type: 2 Dx'ed: 3662 Complications: CVA Therapy: 2 oral meds.  DKA: never Severe hypoglycemia: never.  Pancreatitis: never.  Other: he took insulin for 1 month, in 2017.   Interval history: he says cbg's are well-controlled.  There is no trend throughout the day.  pt states she feels well in general.   Past Medical History:  Diagnosis Date  . Diabetes mellitus   . Hypertension   . Prostatitis 2011    Past Surgical History:  Procedure Laterality Date  . None      Social History   Socioeconomic History  . Marital status: Married    Spouse name: Not on file  . Number of children: Not on file  . Years of education: Not on file  . Highest education level: Not on file  Social Needs  . Financial resource strain: Not on file  . Food insecurity - worry: Not on file  . Food insecurity - inability: Not on file  . Transportation needs - medical: Not on file  . Transportation needs - non-medical: Not on file  Occupational History  . Occupation: PE Product manager: [employer]  Tobacco Use  . Smoking status: Former Research scientist (life sciences)  . Smokeless tobacco: Never Used  Substance and Sexual Activity  . Alcohol use: Yes    Alcohol/week: 1.8 oz    Types: 3 Glasses of wine per week    Comment: Socially  . Drug use: No  . Sexual activity: Yes  Other Topics Concern  . Not on file  Social History Narrative   Regular exercise- yes.  Lives with wife in a 2 story home.  Has no children.     Retired Careers information officer.     Education: college.        Current Outpatient Medications on File Prior to Visit  Medication Sig Dispense Refill  . amLODipine-valsartan (EXFORGE) 5-320 MG tablet Take 1 tablet by mouth daily. 90 tablet 3  . aspirin 325 MG tablet Take 1 tablet (325 mg total) by mouth daily. 100 tablet 3  . atorvastatin (LIPITOR) 20 MG  tablet TAKE 1 TABLET (20 MG TOTAL) BY MOUTH DAILY. 90 tablet 1  . b complex vitamins tablet Take 1 tablet by mouth daily. 100 tablet 3  . baclofen (LIORESAL) 10 MG tablet TAKE 1 TABLET BY MOUTH TWICE A DAY 60 tablet 5  . blood glucose meter kit and supplies KIT Dispense based on patient and insurance preference. Use two times daily as directed. (FOR ICD-9 250.00, 250.01). 1 each 0  . Cholecalciferol 1000 UNITS tablet Take 1,000 Units by mouth daily.      Marland Kitchen doxazosin (CARDURA) 2 MG tablet TAKE 1 TABLET BY MOUTH EVERY DAY 30 tablet 11  . glucose blood (ONETOUCH VERIO) test strip Use to check blood sugars twice a day Dx E11.9 Yearly physical w/labs are due must see MD for refills 100 each 0  . polyethylene glycol powder (GLYCOLAX/MIRALAX) powder Take 17 g by mouth 2 (two) times daily as needed for moderate constipation. 500 g 11  . PROBIOTIC CAPS Take 1 capsule by mouth daily.      . sildenafil (VIAGRA) 100 MG tablet Take 1 tablet (100 mg total) by mouth daily as needed. 10 tablet 11  . triamcinolone cream (KENALOG) 0.1 % Apply 1 application topically 2 (  two) times daily. 80 g 3  . Vitamin D, Ergocalciferol, (DRISDOL) 50000 units CAPS capsule Take 1 capsule (50,000 Units total) by mouth every 7 (seven) days. 12 capsule 0  . cephALEXin (KEFLEX) 500 MG capsule Take 1 capsule (500 mg total) by mouth 4 (four) times daily. (Patient not taking: Reported on 06/02/2017) 20 capsule 0  . gabapentin (NEURONTIN) 100 MG capsule Take 2 capsules (200 mg total) by mouth at bedtime. (Patient not taking: Reported on 06/02/2017) 60 capsule 3   No current facility-administered medications on file prior to visit.     Allergies  Allergen Reactions  . Accuretic [Quinapril-Hydrochlorothiazide]     Drug rash ?    Family History  Problem Relation Age of Onset  . Cancer Mother 29       leukemia  . Pancreatic cancer Father   . Hypertension Other   . Diabetes Maternal Grandfather   . Stroke Sister   . Cancer Brother    . HIV/AIDS Brother     BP 128/74   Pulse 74   Ht _0  (1.778 m)   Wt 213 lb (96.6 kg)   SpO2 96%   BMI 30.56 kg/m    Review of Systems He denies hypoglycemia    Objective:   Physical Exam VITAL SIGNS:  See vs page GENERAL: no distress Pulses: foot pulses are intact bilaterally.   MSK: no deformity of the feet or ankles.  CV: no edema of the legs or ankles Skin:  no ulcer on the feet or ankles, but the skin is dry.  normal color and temp on the feet and ankles Neuro: sensation is intact to touch on the feet and ankles.   Ext: There is bilateral onychomycosis of the toenails. There is ecchymosis under the left great toenail.    Lab Results  Component Value Date   HGBA1C 6.2 06/02/2017   Lab Results  Component Value Date   CREATININE 0.92 03/11/2017   BUN 13 03/11/2017   NA 141 03/11/2017   K 4.3 03/11/2017   CL 107 03/11/2017   CO2 26 03/11/2017       Assessment & Plan:  Type 2 DM: well-controlled CVA: in this setting, he should avoid hypoglycemia  Patient Instructions  check your blood sugar once a day.  vary the time of day when you check, between before the 3 meals, and at bedtime.  also check if you have symptoms of your blood sugar being too high or too low.  please keep a record of the readings and bring it to your next appointment here (or you can bring the meter itself).  You can write it on any piece of paper.  please call us sooner if your blood sugar goes below 70, or if you have a lot of readings over 200. Please continue the same medications.  Please come back for a follow-up appointment in 6 months.

## 2017-06-10 ENCOUNTER — Other Ambulatory Visit (INDEPENDENT_AMBULATORY_CARE_PROVIDER_SITE_OTHER): Payer: Medicare Other

## 2017-06-10 DIAGNOSIS — E1159 Type 2 diabetes mellitus with other circulatory complications: Secondary | ICD-10-CM

## 2017-06-10 DIAGNOSIS — Z0000 Encounter for general adult medical examination without abnormal findings: Secondary | ICD-10-CM | POA: Diagnosis not present

## 2017-06-10 LAB — BASIC METABOLIC PANEL
BUN: 11 mg/dL (ref 6–23)
CHLORIDE: 108 meq/L (ref 96–112)
CO2: 23 mEq/L (ref 19–32)
CREATININE: 0.94 mg/dL (ref 0.40–1.50)
Calcium: 9.1 mg/dL (ref 8.4–10.5)
GFR: 102.87 mL/min (ref >60.00)
Glucose, Bld: 151 mg/dL — ABNORMAL HIGH (ref 70–99)
POTASSIUM: 4.2 meq/L (ref 3.5–5.1)
Sodium: 143 mEq/L (ref 135–145)

## 2017-06-10 LAB — HEPATIC FUNCTION PANEL
ALK PHOS: 54 U/L (ref 39–117)
ALT: 22 U/L (ref 0–53)
AST: 18 U/L (ref 0–37)
Albumin: 4.6 g/dL (ref 3.5–5.2)
BILIRUBIN DIRECT: 0.2 mg/dL (ref 0.0–0.3)
BILIRUBIN TOTAL: 0.4 mg/dL (ref 0.2–1.2)
TOTAL PROTEIN: 7.1 g/dL (ref 6.0–8.3)

## 2017-06-10 LAB — CBC WITH DIFFERENTIAL/PLATELET
Basophils Absolute: 0 10*3/uL (ref 0.0–0.1)
Basophils Relative: 0.4 % (ref 0.0–3.0)
EOS ABS: 0.2 10*3/uL (ref 0.0–0.7)
Eosinophils Relative: 2.9 % (ref 0.0–5.0)
HCT: 44.4 % (ref 39.0–52.0)
HEMOGLOBIN: 15.1 g/dL (ref 13.0–17.0)
LYMPHS PCT: 32.9 % (ref 12.0–46.0)
Lymphs Abs: 1.9 10*3/uL (ref 0.7–4.0)
MCHC: 34 g/dL (ref 30.0–36.0)
MCV: 94.6 fl (ref 78.0–100.0)
MONO ABS: 0.5 10*3/uL (ref 0.1–1.0)
Monocytes Relative: 9.3 % (ref 3.0–12.0)
Neutro Abs: 3.1 10*3/uL (ref 1.4–7.7)
Neutrophils Relative %: 54.5 % (ref 43.0–77.0)
Platelets: 249 10*3/uL (ref 150.0–400.0)
RBC: 4.69 Mil/uL (ref 4.22–5.81)
RDW: 13.4 % (ref 11.5–15.5)
WBC: 5.7 10*3/uL (ref 4.0–10.5)

## 2017-06-10 LAB — URINALYSIS, ROUTINE W REFLEX MICROSCOPIC
Bilirubin Urine: NEGATIVE
KETONES UR: NEGATIVE
Nitrite: NEGATIVE
PH: 6 (ref 5.0–8.0)
SPECIFIC GRAVITY, URINE: 1.02 (ref 1.000–1.030)
TOTAL PROTEIN, URINE-UPE24: NEGATIVE
Urine Glucose: NEGATIVE
Urobilinogen, UA: 1 (ref 0.0–1.0)

## 2017-06-10 LAB — HEMOGLOBIN A1C: HEMOGLOBIN A1C: 6.3 % (ref 4.6–6.5)

## 2017-06-10 LAB — LIPID PANEL
Cholesterol: 80 mg/dL (ref 0–200)
HDL: 35.9 mg/dL — ABNORMAL LOW (ref >39.00)
LDL CALC: 33 mg/dL (ref 0–99)
NonHDL: 44.42
Total CHOL/HDL Ratio: 2
Triglycerides: 58 mg/dL (ref 0.0–149.0)
VLDL: 11.6 mg/dL (ref 0.0–40.0)

## 2017-06-10 LAB — TSH: TSH: 2.08 u[IU]/mL (ref 0.35–4.50)

## 2017-06-11 ENCOUNTER — Ambulatory Visit (INDEPENDENT_AMBULATORY_CARE_PROVIDER_SITE_OTHER): Payer: Medicare Other | Admitting: Internal Medicine

## 2017-06-11 ENCOUNTER — Encounter: Payer: Self-pay | Admitting: Internal Medicine

## 2017-06-11 DIAGNOSIS — K5903 Drug induced constipation: Secondary | ICD-10-CM | POA: Diagnosis not present

## 2017-06-11 DIAGNOSIS — I639 Cerebral infarction, unspecified: Secondary | ICD-10-CM | POA: Diagnosis not present

## 2017-06-11 DIAGNOSIS — G8191 Hemiplegia, unspecified affecting right dominant side: Secondary | ICD-10-CM | POA: Diagnosis not present

## 2017-06-11 DIAGNOSIS — E1169 Type 2 diabetes mellitus with other specified complication: Secondary | ICD-10-CM

## 2017-06-11 DIAGNOSIS — I10 Essential (primary) hypertension: Secondary | ICD-10-CM

## 2017-06-11 DIAGNOSIS — E1159 Type 2 diabetes mellitus with other circulatory complications: Secondary | ICD-10-CM

## 2017-06-11 DIAGNOSIS — E785 Hyperlipidemia, unspecified: Secondary | ICD-10-CM

## 2017-06-11 DIAGNOSIS — I152 Hypertension secondary to endocrine disorders: Secondary | ICD-10-CM

## 2017-06-11 MED ORDER — LINACLOTIDE 290 MCG PO CAPS: 290.0000 ug | ORAL_CAPSULE | Freq: Every day | ORAL | 3 refills | Status: DC | PRN

## 2017-06-11 NOTE — Assessment & Plan Note (Signed)
Metformin, Trajenta 

## 2017-06-11 NOTE — Assessment & Plan Note (Signed)
Linzess 

## 2017-06-11 NOTE — Assessment & Plan Note (Signed)
On Rx 

## 2017-06-11 NOTE — Assessment & Plan Note (Signed)
Lipitor 

## 2017-06-11 NOTE — Progress Notes (Signed)
Subjective:  Patient ID: Christian Clements, male    DOB: 04/11/1950  Age: 67 y.o. MRN: [id number]  CC: No chief complaint on file.   HPI [name] presents for CVA, dyslipidemia, HTN, DM f/u C/o constipation since his CVA  Outpatient Medications Prior to Visit  Medication Sig Dispense Refill  . amLODipine-valsartan (EXFORGE) 5-320 MG tablet Take 1 tablet by mouth daily. 90 tablet 3  . aspirin 325 MG tablet Take 1 tablet (325 mg total) by mouth daily. 100 tablet 3  . atorvastatin (LIPITOR) 20 MG tablet TAKE 1 TABLET (20 MG TOTAL) BY MOUTH DAILY. 90 tablet 1  . b complex vitamins tablet Take 1 tablet by mouth daily. 100 tablet 3  . baclofen (LIORESAL) 10 MG tablet TAKE 1 TABLET BY MOUTH TWICE A DAY 60 tablet 5  . blood glucose meter kit and supplies KIT Dispense based on patient and insurance preference. Use two times daily as directed. (FOR ICD-9 250.00, 250.01). 1 each 0  . cephALEXin (KEFLEX) 500 MG capsule Take 1 capsule (500 mg total) by mouth 4 (four) times daily. 20 capsule 0  . Cholecalciferol 1000 UNITS tablet Take 1,000 Units by mouth daily.      Marland Kitchen doxazosin (CARDURA) 2 MG tablet TAKE 1 TABLET BY MOUTH EVERY DAY 30 tablet 11  . gabapentin (NEURONTIN) 100 MG capsule Take 2 capsules (200 mg total) by mouth at bedtime. 60 capsule 3  . glucose blood (ONETOUCH VERIO) test strip Use to check blood sugars twice a day Dx E11.9 Yearly physical w/labs are due must see MD for refills 100 each 0  . linagliptin (TRADJENTA) 5 MG TABS tablet Take 1 tablet (5 mg total) by mouth daily. 30 tablet 11  . metFORMIN (GLUCOPHAGE) 1000 MG tablet Take 1 tablet (1,000 mg total) by mouth 2 (two) times daily with a meal. 180 tablet 3  . polyethylene glycol powder (GLYCOLAX/MIRALAX) powder Take 17 g by mouth 2 (two) times daily as needed for moderate constipation. 500 g 11  . PROBIOTIC CAPS Take 1 capsule by mouth daily.      . sildenafil (VIAGRA) 100 MG tablet Take 1 tablet (100 mg total) by mouth daily  as needed. 10 tablet 11  . triamcinolone cream (KENALOG) 0.1 % Apply 1 application topically 2 (two) times daily. 80 g 3  . Vitamin D, Ergocalciferol, (DRISDOL) 50000 units CAPS capsule Take 1 capsule (50,000 Units total) by mouth every 7 (seven) days. 12 capsule 0   No facility-administered medications prior to visit.     ROS Review of Systems  Constitutional: Negative for appetite change, fatigue and unexpected weight change.  HENT: Negative for congestion, nosebleeds, sneezing, sore throat and trouble swallowing.   Eyes: Negative for itching and visual disturbance.  Respiratory: Negative for cough.   Cardiovascular: Negative for chest pain, palpitations and leg swelling.  Gastrointestinal: Positive for constipation. Negative for abdominal distention, blood in stool, diarrhea and nausea.  Genitourinary: Negative for frequency and hematuria.  Musculoskeletal: Positive for gait problem. Negative for back pain, joint swelling and neck pain.  Skin: Negative for rash.  Neurological: Negative for dizziness, tremors, speech difficulty and weakness.  Psychiatric/Behavioral: Negative for agitation, dysphoric mood, sleep disturbance and suicidal ideas. The patient is not nervous/anxious.     Objective:  BP 126/74 (BP Location: Left Arm, Patient Position: Sitting, Cuff Size: Large)   Pulse 80   Temp 97.9 F (36.6 C) (Oral)   Ht 5' 10"  (1.778 m)   Wt 215 lb (97.5 kg)  SpO2 99%   BMI 30.85 kg/m   BP Readings from Last 3 Encounters:  06/11/17 126/74  06/02/17 128/74  03/12/17 (!) 150/78    Wt Readings from Last 3 Encounters:  06/11/17 215 lb (97.5 kg)  06/02/17 213 lb (96.6 kg)  03/12/17 211 lb (95.7 kg)    Physical Exam  Constitutional: He is oriented to person, place, and time. He appears well-developed. No distress.  NAD  HENT:  Mouth/Throat: Oropharynx is clear and moist.  Eyes: Pupils are equal, round, and reactive to light. Conjunctivae are normal.  Neck: Normal range of  motion. No JVD present. No thyromegaly present.  Cardiovascular: Normal rate, regular rhythm, normal heart sounds and intact distal pulses. Exam reveals no gallop and no friction rub.  No murmur heard. Pulmonary/Chest: Effort normal and breath sounds normal. No respiratory distress. He has no wheezes. He has no rales. He exhibits no tenderness.  Abdominal: Soft. Bowel sounds are normal. He exhibits no distension and no mass. There is no tenderness. There is no rebound and no guarding.  Musculoskeletal: Normal range of motion. He exhibits no edema or tenderness.  Lymphadenopathy:    He has no cervical adenopathy.  Neurological: He is alert and oriented to person, place, and time. He has normal reflexes. No cranial nerve deficit. He exhibits normal muscle tone. He displays a negative Romberg sign. Coordination and gait normal.  Skin: Skin is warm and dry. No rash noted.  Psychiatric: He has a normal mood and affect. His behavior is normal. Judgment and thought content normal.   Mild hemiparesis R  Lab Results  Component Value Date   WBC 5.7 06/10/2017   HGB 15.1 06/10/2017   HCT 44.4 06/10/2017   PLT 249.0 06/10/2017   GLUCOSE 151 (H) 06/10/2017   CHOL 80 06/10/2017   TRIG 58.0 06/10/2017   HDL 35.90 (L) 06/10/2017   LDLDIRECT 30.1 11/14/2010   LDLCALC 33 06/10/2017   ALT 22 06/10/2017   AST 18 06/10/2017   NA 143 06/10/2017   K 4.2 06/10/2017   CL 108 06/10/2017   CREATININE 0.94 06/10/2017   BUN 11 06/10/2017   CO2 23 06/10/2017   TSH 2.08 06/10/2017   PSA 0.32 11/14/2010   INR 1.2 (H) 05/15/2016   HGBA1C 6.3 06/10/2017    Dg Hip Unilat With Pelvis 2-3 Views Right  Result Date: 11/27/2016 CLINICAL DATA:  Right hip pain EXAM: DG HIP (WITH OR WITHOUT PELVIS) 2-3V RIGHT COMPARISON:  10/27/2016 FINDINGS: There is no evidence of hip fracture or dislocation. There is no evidence of arthropathy or other focal bone abnormality. IMPRESSION: Negative. Electronically Signed   By:  Franchot Gallo M.D.   On: 11/27/2016 15:58    Assessment & Plan:   There are no diagnoses linked to this encounter. I am having Delfino Lovett maintain his Probiotic, Cholecalciferol, sildenafil, blood glucose meter kit and supplies, aspirin, glucose blood, polyethylene glycol powder, b complex vitamins, triamcinolone cream, amLODipine-valsartan, Vitamin D (Ergocalciferol), gabapentin, atorvastatin, cephALEXin, baclofen, doxazosin, linagliptin, and metFORMIN.  No orders of the defined types were placed in this encounter.    Follow-up: No follow-ups on file.  Walker Kehr, MD

## 2017-06-11 NOTE — Assessment & Plan Note (Signed)
ASA, Lipitor, Exforge 

## 2017-06-11 NOTE — Assessment & Plan Note (Signed)
[place] 

## 2017-09-09 ENCOUNTER — Other Ambulatory Visit: Payer: Self-pay | Admitting: Internal Medicine

## 2017-09-14 ENCOUNTER — Other Ambulatory Visit: Payer: Self-pay | Admitting: Internal Medicine

## 2017-09-14 ENCOUNTER — Ambulatory Visit: Payer: Medicare Other | Admitting: Internal Medicine

## 2017-09-14 ENCOUNTER — Other Ambulatory Visit (INDEPENDENT_AMBULATORY_CARE_PROVIDER_SITE_OTHER): Payer: Medicare Other

## 2017-09-14 ENCOUNTER — Encounter: Payer: Self-pay | Admitting: Internal Medicine

## 2017-09-14 DIAGNOSIS — I69351 Hemiplegia and hemiparesis following cerebral infarction affecting right dominant side: Secondary | ICD-10-CM

## 2017-09-14 DIAGNOSIS — K5903 Drug induced constipation: Secondary | ICD-10-CM

## 2017-09-14 DIAGNOSIS — R972 Elevated prostate specific antigen [PSA]: Secondary | ICD-10-CM

## 2017-09-14 DIAGNOSIS — G8191 Hemiplegia, unspecified affecting right dominant side: Secondary | ICD-10-CM

## 2017-09-14 DIAGNOSIS — I69359 Hemiplegia and hemiparesis following cerebral infarction affecting unspecified side: Secondary | ICD-10-CM

## 2017-09-14 DIAGNOSIS — E1159 Type 2 diabetes mellitus with other circulatory complications: Secondary | ICD-10-CM | POA: Diagnosis not present

## 2017-09-14 DIAGNOSIS — Z0000 Encounter for general adult medical examination without abnormal findings: Secondary | ICD-10-CM

## 2017-09-14 DIAGNOSIS — E1169 Type 2 diabetes mellitus with other specified complication: Secondary | ICD-10-CM | POA: Diagnosis not present

## 2017-09-14 DIAGNOSIS — E785 Hyperlipidemia, unspecified: Secondary | ICD-10-CM

## 2017-09-14 LAB — PSA: PSA: 2.34 ng/mL (ref 0.10–4.00)

## 2017-09-14 NOTE — Assessment & Plan Note (Signed)
F/u Dr Loanne Drilling Metformin, Arty Baumgartner

## 2017-09-14 NOTE — Assessment & Plan Note (Signed)
Linzess prn 

## 2017-09-14 NOTE — Assessment & Plan Note (Signed)
ASA, Lipitor, Exforge 

## 2017-09-14 NOTE — Assessment & Plan Note (Signed)
Lipitor 

## 2017-09-14 NOTE — Assessment & Plan Note (Signed)
Started swimming

## 2017-09-14 NOTE — Progress Notes (Signed)
Subjective:  Patient ID: Christian Clements, male    DOB: 22-Oct-1950  Age: 67 y.o. MRN: [id number]  CC: No chief complaint on file.   HPI Christian Clements presents for CVA, HTN, DM f/u  Outpatient Medications Prior to Visit  Medication Sig Dispense Refill  . amLODipine-valsartan (EXFORGE) 5-320 MG tablet Take 1 tablet by mouth daily. 90 tablet 3  . aspirin 325 MG tablet Take 1 tablet (325 mg total) by mouth daily. 100 tablet 3  . atorvastatin (LIPITOR) 20 MG tablet TAKE 1 TABLET (20 MG TOTAL) BY MOUTH DAILY. 90 tablet 1  . b complex vitamins tablet Take 1 tablet by mouth daily. 100 tablet 3  . baclofen (LIORESAL) 10 MG tablet TAKE 1 TABLET BY MOUTH TWICE A DAY 60 tablet 5  . blood glucose meter kit and supplies KIT Dispense based on patient and insurance preference. Use two times daily as directed. (FOR ICD-9 250.00, 250.01). 1 each 0  . cephALEXin (KEFLEX) 500 MG capsule Take 1 capsule (500 mg total) by mouth 4 (four) times daily. 20 capsule 0  . Cholecalciferol 1000 UNITS tablet Take 1,000 Units by mouth daily.      Marland Kitchen doxazosin (CARDURA) 2 MG tablet TAKE 1 TABLET BY MOUTH EVERY DAY 30 tablet 11  . gabapentin (NEURONTIN) 100 MG capsule Take 2 capsules (200 mg total) by mouth at bedtime. 60 capsule 3  . glucose blood (ONETOUCH VERIO) test strip Use to check blood sugars twice a day Dx E11.9 Yearly physical w/labs are due must see MD for refills 100 each 0  . linaclotide (LINZESS) 290 MCG CAPS capsule Take 1 capsule (290 mcg total) by mouth daily as needed. 90 capsule 3  . linagliptin (TRADJENTA) 5 MG TABS tablet Take 1 tablet (5 mg total) by mouth daily. 30 tablet 11  . metFORMIN (GLUCOPHAGE) 1000 MG tablet Take 1 tablet (1,000 mg total) by mouth 2 (two) times daily with a meal. 180 tablet 3  . polyethylene glycol powder (GLYCOLAX/MIRALAX) powder Take 17 g by mouth 2 (two) times daily as needed for moderate constipation. 500 g 11  . PROBIOTIC CAPS Take 1 capsule by mouth daily.      .  sildenafil (VIAGRA) 100 MG tablet Take 1 tablet (100 mg total) by mouth daily as needed. 10 tablet 11  . triamcinolone cream (KENALOG) 0.1 % Apply 1 application topically 2 (two) times daily. 80 g 3  . Vitamin D, Ergocalciferol, (DRISDOL) 50000 units CAPS capsule Take 1 capsule (50,000 Units total) by mouth every 7 (seven) days. 12 capsule 0   No facility-administered medications prior to visit.     ROS: Review of Systems  Constitutional: Negative for appetite change, fatigue and unexpected weight change.  HENT: Negative for congestion, nosebleeds, sneezing, sore throat and trouble swallowing.   Eyes: Negative for itching and visual disturbance.  Respiratory: Negative for cough.   Cardiovascular: Negative for chest pain, palpitations and leg swelling.  Gastrointestinal: Negative for abdominal distention, blood in stool, diarrhea and nausea.  Genitourinary: Negative for frequency and hematuria.  Musculoskeletal: Negative for back pain, gait problem, joint swelling and neck pain.  Skin: Negative for rash.  Neurological: Positive for weakness. Negative for dizziness, tremors and speech difficulty.  Psychiatric/Behavioral: Negative for agitation, dysphoric mood, sleep disturbance and suicidal ideas. The patient is not nervous/anxious.     Objective:  BP 122/74 (BP Location: Left Arm, Patient Position: Sitting, Cuff Size: Large)   Pulse 66   Temp 98.4 F (36.9 C) (Oral)  Ht _0  (1.778 m)   Wt 210 lb (95.3 kg)   SpO2 98%   BMI 30.13 kg/m   BP Readings from Last 3 Encounters:  09/14/17 122/74  06/11/17 126/74  06/02/17 128/74    Wt Readings from Last 3 Encounters:  09/14/17 210 lb (95.3 kg)  06/11/17 215 lb (97.5 kg)  06/02/17 213 lb (96.6 kg)    Physical Exam  Constitutional: He is oriented to person, place, and time. He appears well-developed. No distress.  NAD  HENT:  Mouth/Throat: Oropharynx is clear and moist.  Eyes: Pupils are equal, round, and reactive to light.  Conjunctivae are normal.  Neck: Normal range of motion. No JVD present. No thyromegaly present.  Cardiovascular: Normal rate, regular rhythm, normal heart sounds and intact distal pulses. Exam reveals no gallop and no friction rub.  No murmur heard. Pulmonary/Chest: Effort normal and breath sounds normal. No respiratory distress. He has no wheezes. He has no rales. He exhibits no tenderness.  Abdominal: Soft. Bowel sounds are normal. He exhibits no distension and no mass. There is no tenderness. There is no rebound and no guarding.  Musculoskeletal: Normal range of motion. He exhibits no edema or tenderness.  Lymphadenopathy:    He has no cervical adenopathy.  Neurological: He is alert and oriented to person, place, and time. He has normal reflexes. No cranial nerve deficit. He exhibits normal muscle tone. He displays a negative Romberg sign. Coordination and gait normal.  Skin: Skin is warm and dry. No rash noted.  Psychiatric: He has a normal mood and affect. His behavior is normal. Judgment and thought content normal.    Lab Results  Component Value Date   WBC 5.7 06/10/2017   HGB 15.1 06/10/2017   HCT 44.4 06/10/2017   PLT 249.0 06/10/2017   GLUCOSE 151 (H) 06/10/2017   CHOL 80 06/10/2017   TRIG 58.0 06/10/2017   HDL 35.90 (L) 06/10/2017   LDLDIRECT 30.1 11/14/2010   LDLCALC 33 06/10/2017   ALT 22 06/10/2017   AST 18 06/10/2017   NA 143 06/10/2017   K 4.2 06/10/2017   CL 108 06/10/2017   CREATININE 0.94 06/10/2017   BUN 11 06/10/2017   CO2 23 06/10/2017   TSH 2.08 06/10/2017   PSA 0.32 11/14/2010   INR 1.2 (H) 05/15/2016   HGBA1C 6.3 06/10/2017    Dg Hip Unilat With Pelvis 2-3 Views Right  Result Date: 11/27/2016 CLINICAL DATA:  Right hip pain EXAM: DG HIP (WITH OR WITHOUT PELVIS) 2-3V RIGHT COMPARISON:  10/27/2016 FINDINGS: There is no evidence of hip fracture or dislocation. There is no evidence of arthropathy or other focal bone abnormality. IMPRESSION: Negative.  Electronically Signed   By: Franchot Gallo M.D.   On: 11/27/2016 15:58    Assessment & Plan:   There are no diagnoses linked to this encounter.   No orders of the defined types were placed in this encounter.    Follow-up: No follow-ups on file.  Walker Kehr, MD

## 2017-10-08 ENCOUNTER — Other Ambulatory Visit: Payer: Self-pay | Admitting: Neurology

## 2017-11-08 ENCOUNTER — Other Ambulatory Visit: Payer: Self-pay | Admitting: Internal Medicine

## 2017-12-03 ENCOUNTER — Ambulatory Visit: Payer: Medicare Other | Admitting: Endocrinology

## 2017-12-03 ENCOUNTER — Encounter: Payer: Self-pay | Admitting: Endocrinology

## 2017-12-03 VITALS — BP 136/82 | HR 62 | Ht 70.0 in | Wt 208.8 lb

## 2017-12-03 DIAGNOSIS — E1159 Type 2 diabetes mellitus with other circulatory complications: Secondary | ICD-10-CM | POA: Diagnosis not present

## 2017-12-03 LAB — POCT GLYCOSYLATED HEMOGLOBIN (HGB A1C): HEMOGLOBIN A1C: 5.8 % — AB (ref 4.0–5.6)

## 2017-12-03 NOTE — Progress Notes (Signed)
Subjective:    Patient ID: Christian Clements, male    DOB: November 09, 1950, 67 y.o.   MRN: [id number]  HPI Pt returns for f/u of diabetes mellitus: DM type: 2 Dx'ed: 3662 Complications: CVA Therapy: 2 oral meds.  DKA: never Severe hypoglycemia: never.  Pancreatitis: never.  Other: he took insulin for 1 month, in 2017.   Interval history: he says cbg's are well-controlled.  pt states she feels well in general.  Past Medical History:  Diagnosis Date  . Diabetes mellitus   . Hypertension   . Prostatitis 2011    Past Surgical History:  Procedure Laterality Date  . None      Social History   Socioeconomic History  . Marital status: Married    Spouse name: Not on file  . Number of children: Not on file  . Years of education: Not on file  . Highest education level: Not on file  Occupational History  . Occupation: PE Product manager: [employer]  Social Needs  . Financial resource strain: Not on file  . Food insecurity:    Worry: Not on file    Inability: Not on file  . Transportation needs:    Medical: Not on file    Non-medical: Not on file  Tobacco Use  . Smoking status: Former Research scientist (life sciences)  . Smokeless tobacco: Never Used  Substance and Sexual Activity  . Alcohol use: Yes    Alcohol/week: 3.0 standard drinks    Types: 3 Glasses of wine per week    Comment: Socially  . Drug use: No  . Sexual activity: Yes  Lifestyle  . Physical activity:    Days per week: Not on file    Minutes per session: Not on file  . Stress: Not on file  Relationships  . Social connections:    Talks on phone: Not on file    Gets together: Not on file    Attends religious service: Not on file    Active member of club or organization: Not on file    Attends meetings of clubs or organizations: Not on file    Relationship status: Not on file  . Intimate partner violence:    Fear of current or ex partner: Not on file    Emotionally abused: Not on file    Physically abused: Not  on file    Forced sexual activity: Not on file  Other Topics Concern  . Not on file  Social History Narrative   Regular exercise- yes.  Lives with wife in a 2 story home.  Has no children.     Retired Careers information officer.     Education: college.        Current Outpatient Medications on File Prior to Visit  Medication Sig Dispense Refill  . amLODipine-valsartan (EXFORGE) 5-320 MG tablet TAKE 1 TABLET BY MOUTH EVERY DAY 90 tablet 1  . aspirin 325 MG tablet Take 1 tablet (325 mg total) by mouth daily. 100 tablet 3  . atorvastatin (LIPITOR) 20 MG tablet TAKE 1 TABLET (20 MG TOTAL) BY MOUTH DAILY. 90 tablet 1  . b complex vitamins tablet Take 1 tablet by mouth daily. 100 tablet 3  . baclofen (LIORESAL) 10 MG tablet TAKE 1 TABLET BY MOUTH TWICE A DAY 60 tablet 5  . blood glucose meter kit and supplies KIT Dispense based on patient and insurance preference. Use two times daily as directed. (FOR ICD-9 250.00, 250.01). 1 each 0  . Cholecalciferol 1000 UNITS tablet  Take 1,000 Units by mouth daily.      Marland Kitchen doxazosin (CARDURA) 2 MG tablet TAKE 1 TABLET BY MOUTH EVERY DAY 30 tablet 11  . gabapentin (NEURONTIN) 100 MG capsule Take 2 capsules (200 mg total) by mouth at bedtime. 60 capsule 3  . glucose blood (ONETOUCH VERIO) test strip Use to check blood sugars twice a day Dx E11.9 Yearly physical w/labs are due must see MD for refills 100 each 0  . linaclotide (LINZESS) 290 MCG CAPS capsule Take 1 capsule (290 mcg total) by mouth daily as needed. 90 capsule 3  . linagliptin (TRADJENTA) 5 MG TABS tablet Take 1 tablet (5 mg total) by mouth daily. 30 tablet 11  . metFORMIN (GLUCOPHAGE) 1000 MG tablet Take 1 tablet (1,000 mg total) by mouth 2 (two) times daily with a meal. 180 tablet 3  . polyethylene glycol powder (GLYCOLAX/MIRALAX) powder Take 17 g by mouth 2 (two) times daily as needed for moderate constipation. 500 g 11  . PROBIOTIC CAPS Take 1 capsule by mouth daily.      . sildenafil (VIAGRA) 100 MG tablet  Take 1 tablet (100 mg total) by mouth daily as needed. 10 tablet 11  . triamcinolone cream (KENALOG) 0.1 % Apply 1 application topically 2 (two) times daily. 80 g 3   No current facility-administered medications on file prior to visit.     Allergies  Allergen Reactions  . Accuretic [Quinapril-Hydrochlorothiazide]     Drug rash ?    Family History  Problem Relation Age of Onset  . Cancer Mother 28       leukemia  . Pancreatic cancer Father   . Hypertension Other   . Diabetes Maternal Grandfather   . Stroke Sister   . Cancer Brother   . HIV/AIDS Brother     BP 136/82 (BP Location: Right Arm)   Pulse 62   Ht '5\' 10"'$  (1.778 m)   Wt 208 lb 12.8 oz (94.7 kg)   SpO2 96%   BMI 29.96 kg/m    Review of Systems Denies n/v/d    Objective:   Physical Exam VITAL SIGNS:  See vs page GENERAL: no distress Pulses: foot pulses are intact bilaterally.   MSK: no deformity of the feet or ankles.  CV: 1+ right leg edema, and trace on the left Skin:  no ulcer on the feet or ankles, but the skin is dry.  normal color and temp on the feet and ankles Neuro: sensation is intact to touch on the feet and ankles.   Ext: There is bilateral onychomycosis of the toenails. There is ecchymosis under the left great toenail.   Lab Results  Component Value Date   HGBA1C 5.8 (A) 12/03/2017       Assessment & Plan:  Edema: this limits rx options Type 2 DM, with CVA: well-controlled  Patient Instructions  check your blood sugar once a day.  vary the time of day when you check, between before the 3 meals, and at bedtime.  also check if you have symptoms of your blood sugar being too high or too low.  please keep a record of the readings and bring it to your next appointment here (or you can bring the meter itself).  You can write it on any piece of paper.  please call us sooner if your blood sugar goes below 70, or if you have a lot of readings over 200. Please continue the same medications.  Please  come back for a follow-up appointment  in 6 months.

## 2017-12-03 NOTE — Patient Instructions (Signed)

## 2017-12-11 ENCOUNTER — Ambulatory Visit: Payer: Medicare Other | Admitting: Neurology

## 2017-12-11 ENCOUNTER — Encounter: Payer: Self-pay | Admitting: Neurology

## 2017-12-11 VITALS — BP 118/70 | HR 84 | Ht 70.0 in | Wt 206.5 lb

## 2017-12-11 DIAGNOSIS — I639 Cerebral infarction, unspecified: Secondary | ICD-10-CM

## 2017-12-11 DIAGNOSIS — G8111 Spastic hemiplegia affecting right dominant side: Secondary | ICD-10-CM

## 2017-12-11 DIAGNOSIS — R413 Other amnesia: Secondary | ICD-10-CM

## 2017-12-11 MED ORDER — BACLOFEN 10 MG PO TABS: 10.0000 mg | ORAL_TABLET | Freq: Three times a day (TID) | ORAL | 3 refills | Status: DC

## 2017-12-11 NOTE — Patient Instructions (Addendum)
Start taking half-tablet baclofen in the afternoon for one week, then increase to 1 tablet.  Continue baclofen 10mg  in the morning and afternoon.   Refer to physical therapy for right leg stretching  Return to clinic in 6 months

## 2017-12-11 NOTE — Progress Notes (Signed)
Follow-up Visit   Date: 12/11/17    Christian Clements MRN: [id number] DOB: [birth year]   Interim History: Christian Clements is a 67 y.o. 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  The patient was accompanied to the clinic by self.   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  US carotids, MRA head, echo, and cardiac monitoring did not reveal abnormalities.  He was started on aspirin 337m as he was not taking any antiplatelet medication prior to presentation.  He completed physical therapy in January 2018.  Since then, he feels that his knee buckles and wife has noticed that when he walks, his right knee snaps back.  He also complains of right low back pain, described as sharp and shooting for the past 6-7 weeks. It localized to his low back and does not radiate into his leg.  It is always exacerbated by activity, such as getting up.   He stays very active and goes to the gym 5-6 times per week as well as swims.    UPDATE 03/11/2017:  He is here for follow-up visit and has been very compliant with stretching before and after exercises.  He is tolerating balcofen 146mBID well, but stopped it a few days ago, to see if it was helping and has noticed that gait is getting worse again. Wife states that his gait is not as slow as it used to be and leg does not snap back as much, especially when taking baclofen.   UPDATE 12/11/2017:  He is here for follow-up visit.  He is compliant with taking baclofen 10 mg twice daily, which has helped gait, however continues to have difficulty with prolonged walking.  He used to jog 2-3 miles daily 5 days per week and  is bothered because he cannot do this now.  He also cannot play golf because of imbalance and reduced flexibility.  No interval hospitalizations, illnesses, or falls.  He continues to stretch regularly and walks unassisted.  He has also noticed that he is more forgetful than usual and his mind is not as sharp has previously, such as forgetting things to pick up from the store.  He does not have problems with IADLs.   Medications:  Current Outpatient Medications on File Prior to Visit  Medication Sig Dispense Refill  . amLODipine-valsartan (EXFORGE) 5-320 MG tablet TAKE 1 TABLET BY MOUTH EVERY DAY 90 tablet 1  . aspirin 325 MG tablet Take 1 tablet (325 mg total) by mouth daily. 100 tablet 3  . atorvastatin (LIPITOR) 20 MG tablet TAKE 1 TABLET (20 MG TOTAL) BY MOUTH DAILY. 90 tablet 1  . b complex vitamins tablet Take 1 tablet by mouth daily. 100 tablet 3  . blood glucose meter kit and supplies KIT Dispense based on patient and insurance preference. Use two times daily as directed. (FOR ICD-9 250.00, 250.01). 1 each 0  . Cholecalciferol 1000 UNITS tablet Take 1,000 Units by mouth daily.      . Marland Kitchenoxazosin (CARDURA) 2 MG tablet TAKE 1 TABLET BY MOUTH EVERY DAY 30 tablet 11  . gabapentin (NEURONTIN) 100 MG capsule Take 2 capsules (200 mg total) by mouth at bedtime. 60 capsule 3  . glucose blood (  ONETOUCH VERIO) test strip Use to check blood sugars twice a day Dx E11.9 Yearly physical w/labs are due must see MD for refills 100 each 0  . linaclotide (LINZESS) 290 MCG CAPS capsule Take 1 capsule (290 mcg total) by mouth daily as needed. 90 capsule 3  . linagliptin (TRADJENTA) 5 MG TABS tablet Take 1 tablet (5 mg total) by mouth daily. 30 tablet 11  . metFORMIN (GLUCOPHAGE) 1000 MG tablet Take 1 tablet (1,000 mg total) by mouth 2 (two) times daily with a meal. 180 tablet 3  . polyethylene glycol powder (GLYCOLAX/MIRALAX) powder Take 17 g by mouth 2 (two) times daily as needed for moderate constipation. 500  g 11  . PROBIOTIC CAPS Take 1 capsule by mouth daily.      . sildenafil (VIAGRA) 100 MG tablet Take 1 tablet (100 mg total) by mouth daily as needed. 10 tablet 11  . triamcinolone cream (KENALOG) 0.1 % Apply 1 application topically 2 (two) times daily. 80 g 3   No current facility-administered medications on file prior to visit.     Allergies:  Allergies  Allergen Reactions  . Accuretic [Quinapril-Hydrochlorothiazide]     Drug rash ?    Review of Systems:  CONSTITUTIONAL: No fevers, chills, night sweats, or weight loss.  EYES: No visual changes or eye pain ENT: No hearing changes.  No history of nose bleeds.   RESPIRATORY: No cough, wheezing and shortness of breath.   CARDIOVASCULAR: Negative for chest pain, and palpitations.   GI: Negative for abdominal discomfort, blood in stools or black stools.  No recent change in bowel habits.   GU:  No history of incontinence.   MUSCLOSKELETAL: No history of joint pain or swelling.  No myalgias.   SKIN: Negative for lesions, rash, and itching.   ENDOCRINE: Negative for cold or heat intolerance, polydipsia or goiter.   PSYCH:  No depression or anxiety symptoms.   NEURO: As Above.   Vital Signs:  BP 118/70   Pulse 84   Ht _0  (1.778 m)   Wt 206 lb 8 oz (93.7 kg)   SpO2 96%   BMI 29.63 kg/m    General Medical Exam:   General:  Well appearing, comfortable  Eyes/ENT: see cranial nerve examination.   Neck: No masses appreciated.  Full range of motion without tenderness.  No carotid bruits. Respiratory:  Clear to auscultation, good air entry bilaterally.   Cardiac:  Regular rate and rhythm, no murmur.   Ext:  No edema  Neurological Exam: MENTAL STATUS including orientation to time, place, person, recent and remote memory, attention span and concentration, language, and fund of knowledge is normal.  Speech is not dysarthric. Montreal Cognitive Assessment  12/11/2017  Visuospatial/ Executive (0/5) 5  Naming (0/3) 3  Attention: Read  list of digits (0/2) 2  Attention: Read list of letters (0/1) 1  Attention: Serial 7 subtraction starting at 100 (0/3) 2  Language: Repeat phrase (0/2) 2  Language : Fluency (0/1) 1  Abstraction (0/2) 1  Delayed Recall (0/5) 0  Orientation (0/6) 6  Total 23  Adjusted Score (based on education) 23    CRANIAL NERVES:  Face is symmetric. Palate elevates symmetrically.  Tongue is midline.  MOTOR:  Motor strength is 5/5 in all extremities, except right intrinsic hand muscles are 4/5.  No atrophy, fasciculations or abnormal movements.  No pronator drift.  Tone is 0+ in the RLE.    MSRs:  Reflexes are 2+/4 throughout, except right patella  is 3+/4.  COORDINATION/GAIT:  Gait shows trace hyperextension of the right knee, mild spasticity.  Appears stable and unassisted  Data: MRI/A brain 01/04/2016: Acute sub cm infarction at the ventral pontomedullary junction just to the left of midline. Small acute left frontal subcortical white matter infarction. These 2 acute infarctions could be coincidental in due to ordinary small vessel disease, but emboli from the heart or ascending aorta could also result in this. Moderate chronic small-vessel ischemic changes of the cerebral hemispheric white matter for a age. Negative intracranial MR angiography of the large and medium size vessels.  Cardiac monitor 04/06/2016:  No arrythmia  Echo 01/06/2016:  Left ventricle: The cavity size was normal. Wall thickness was normal. Systolic function was normal. The estimated ejectionfraction was in the range of 55% to 60%.  No defect or patent foramen ovale was identified.  US carotids 01/05/2016:  1-39% bilateral ICA  Lab Results  Component Value Date   HGBA1C 5.8 (A) 12/03/2017   Lab Results  Component Value Date   LDLCALC 33 06/10/2017     IMPRESSION/PLAN: 1.  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  He is still bothered that he  cannot jog or play golf as he was very active prior to his stroke.   - Increase baclofen to 68m three times daily for spasticity  - Start physical therapy for right leg stretching.  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  Given two vascular territories, cardioembolic source is suspected however his cardiac monitoring was normal.  If he has future events, he may need implantable loop recorder.  Continue secondary prevention with aspirin 329mdaily and lipitor 2053mLDL 33).  Blood pressure and diabetes is well-controlled.  3.  Subjective memory changes with mild forgetfulness following stroke. MOCA shows impairment in delayed recall -5, language -1, and attention -1.  Reassured patient there are no signs of dementia, but with his cognitive impairment, I will continue to follow this and repeat at his next visit.  Cognitive exercises were encouraged, and states he is using lumosity and staying very active.    Return to clinic in 6 months  Thank you for allowing me to participate in patient's care.  If I can answer any additional questions, I would be pleased to do so.    Sincerely,    Jevan Gaunt K. PatPosey ProntoO

## 2018-01-15 ENCOUNTER — Ambulatory Visit: Payer: Medicare Other | Admitting: Internal Medicine

## 2018-02-02 ENCOUNTER — Other Ambulatory Visit (INDEPENDENT_AMBULATORY_CARE_PROVIDER_SITE_OTHER): Payer: Medicare Other

## 2018-02-02 DIAGNOSIS — E1159 Type 2 diabetes mellitus with other circulatory complications: Secondary | ICD-10-CM | POA: Diagnosis not present

## 2018-02-02 DIAGNOSIS — R972 Elevated prostate specific antigen [PSA]: Secondary | ICD-10-CM

## 2018-02-02 LAB — BASIC METABOLIC PANEL
BUN: 14 mg/dL (ref 6–23)
CO2: 23 mEq/L (ref 19–32)
Calcium: 9.6 mg/dL (ref 8.4–10.5)
Chloride: 106 mEq/L (ref 96–112)
Creatinine, Ser: 0.89 mg/dL (ref 0.40–1.50)
GFR: 109.36 mL/min (ref >60.00)
GLUCOSE: 115 mg/dL — AB (ref 70–99)
Potassium: 4 mEq/L (ref 3.5–5.1)
Sodium: 143 mEq/L (ref 135–145)

## 2018-02-02 LAB — HEMOGLOBIN A1C: HEMOGLOBIN A1C: 6 % (ref 4.6–6.5)

## 2018-02-03 ENCOUNTER — Encounter: Payer: Self-pay | Admitting: Internal Medicine

## 2018-02-03 ENCOUNTER — Ambulatory Visit: Payer: Medicare Other | Admitting: Internal Medicine

## 2018-02-03 DIAGNOSIS — G8191 Hemiplegia, unspecified affecting right dominant side: Secondary | ICD-10-CM | POA: Diagnosis not present

## 2018-02-03 DIAGNOSIS — I69359 Hemiplegia and hemiparesis following cerebral infarction affecting unspecified side: Secondary | ICD-10-CM

## 2018-02-03 DIAGNOSIS — R972 Elevated prostate specific antigen [PSA]: Secondary | ICD-10-CM | POA: Insufficient documentation

## 2018-02-03 DIAGNOSIS — E1169 Type 2 diabetes mellitus with other specified complication: Secondary | ICD-10-CM

## 2018-02-03 DIAGNOSIS — K5903 Drug induced constipation: Secondary | ICD-10-CM

## 2018-02-03 DIAGNOSIS — E1159 Type 2 diabetes mellitus with other circulatory complications: Secondary | ICD-10-CM

## 2018-02-03 DIAGNOSIS — E785 Hyperlipidemia, unspecified: Secondary | ICD-10-CM

## 2018-02-03 LAB — PSA, TOTAL AND FREE
PSA, % Free: 17 % (calc) — ABNORMAL LOW (ref >25)
PSA, Free: 0.3 ng/mL
PSA, Total: 1.8 ng/mL (ref <=4.0)

## 2018-02-03 NOTE — Assessment & Plan Note (Signed)
Doing well Very active

## 2018-02-03 NOTE — Assessment & Plan Note (Signed)
Metformin, Trajenta Labs reviewed

## 2018-02-03 NOTE — Patient Instructions (Signed)
?  trekking poles

## 2018-02-03 NOTE — Assessment & Plan Note (Signed)
Lipitor 

## 2018-02-03 NOTE — Progress Notes (Signed)
Subjective:  Patient ID: Christian Clements, male    DOB: 12/01/1950  Age: 67 y.o. MRN: [id number]  CC: No chief complaint on file.   HPI Christian Clements presents for CVA, DM, constipation f/u. Refused all shots  Outpatient Medications Prior to Visit  Medication Sig Dispense Refill  . amLODipine-valsartan (EXFORGE) 5-320 MG tablet TAKE 1 TABLET BY MOUTH EVERY DAY 90 tablet 1  . aspirin 325 MG tablet Take 1 tablet (325 mg total) by mouth daily. 100 tablet 3  . atorvastatin (LIPITOR) 20 MG tablet TAKE 1 TABLET (20 MG TOTAL) BY MOUTH DAILY. 90 tablet 1  . b complex vitamins tablet Take 1 tablet by mouth daily. 100 tablet 3  . baclofen (LIORESAL) 10 MG tablet Take 1 tablet (10 mg total) by mouth 3 (three) times daily. 270 tablet 3  . blood glucose meter kit and supplies KIT Dispense based on patient and insurance preference. Use two times daily as directed. (FOR ICD-9 250.00, 250.01). 1 each 0  . Cholecalciferol 1000 UNITS tablet Take 1,000 Units by mouth daily.      Marland Kitchen doxazosin (CARDURA) 2 MG tablet TAKE 1 TABLET BY MOUTH EVERY DAY 30 tablet 11  . gabapentin (NEURONTIN) 100 MG capsule Take 2 capsules (200 mg total) by mouth at bedtime. 60 capsule 3  . glucose blood (ONETOUCH VERIO) test strip Use to check blood sugars twice a day Dx E11.9 Yearly physical w/labs are due must see MD for refills 100 each 0  . linaclotide (LINZESS) 290 MCG CAPS capsule Take 1 capsule (290 mcg total) by mouth daily as needed. 90 capsule 3  . linagliptin (TRADJENTA) 5 MG TABS tablet Take 1 tablet (5 mg total) by mouth daily. 30 tablet 11  . metFORMIN (GLUCOPHAGE) 1000 MG tablet Take 1 tablet (1,000 mg total) by mouth 2 (two) times daily with a meal. 180 tablet 3  . polyethylene glycol powder (GLYCOLAX/MIRALAX) powder Take 17 g by mouth 2 (two) times daily as needed for moderate constipation. 500 g 11  . PROBIOTIC CAPS Take 1 capsule by mouth daily.      . sildenafil (VIAGRA) 100 MG tablet Take 1 tablet (100 mg total)  by mouth daily as needed. 10 tablet 11  . triamcinolone cream (KENALOG) 0.1 % Apply 1 application topically 2 (two) times daily. 80 g 3   No facility-administered medications prior to visit.     ROS: Review of Systems  Constitutional: Negative for appetite change, fatigue and unexpected weight change.  HENT: Negative for congestion, nosebleeds, sneezing, sore throat and trouble swallowing.   Eyes: Negative for itching and visual disturbance.  Respiratory: Negative for cough.   Cardiovascular: Negative for chest pain, palpitations and leg swelling.  Gastrointestinal: Negative for abdominal distention, blood in stool, diarrhea and nausea.  Genitourinary: Negative for frequency and hematuria.  Musculoskeletal: Negative for back pain, gait problem, joint swelling and neck pain.  Skin: Negative for rash.  Neurological: Negative for dizziness, tremors, speech difficulty and weakness.  Psychiatric/Behavioral: Negative for agitation, dysphoric mood and sleep disturbance. The patient is not nervous/anxious.     Objective:  BP 130/76 (BP Location: Left Arm, Patient Position: Sitting, Cuff Size: Normal)   Pulse 62   Temp 97.7 F (36.5 C) (Oral)   Ht '5\' 10"'$  (1.778 m)   Wt 207 lb (93.9 kg)   SpO2 98%   BMI 29.70 kg/m   BP Readings from Last 3 Encounters:  02/03/18 130/76  12/11/17 118/70  12/03/17 136/82    Wt  Readings from Last 3 Encounters:  02/03/18 207 lb (93.9 kg)  12/11/17 206 lb 8 oz (93.7 kg)  12/03/17 208 lb 12.8 oz (94.7 kg)    Physical Exam  Constitutional: He is oriented to person, place, and time. He appears well-developed. No distress.  NAD  HENT:  Mouth/Throat: Oropharynx is clear and moist.  Eyes: Pupils are equal, round, and reactive to light. Conjunctivae are normal.  Neck: Normal range of motion. No JVD present. No thyromegaly present.  Cardiovascular: Normal rate, regular rhythm, normal heart sounds and intact distal pulses. Exam reveals no gallop and no  friction rub.  No murmur heard. Pulmonary/Chest: Effort normal and breath sounds normal. No respiratory distress. He has no wheezes. He has no rales. He exhibits no tenderness.  Abdominal: Soft. Bowel sounds are normal. He exhibits no distension and no mass. There is no tenderness. There is no rebound and no guarding.  Musculoskeletal: Normal range of motion. He exhibits no edema or tenderness.  Lymphadenopathy:    He has no cervical adenopathy.  Neurological: He is alert and oriented to person, place, and time. He has normal reflexes. No cranial nerve deficit. He exhibits normal muscle tone. He displays a negative Romberg sign. Coordination abnormal. Gait normal.  Skin: Skin is warm and dry. No rash noted.  Psychiatric: He has a normal mood and affect. His behavior is normal. Judgment and thought content normal.  ataxic a little  Lab Results  Component Value Date   WBC 5.7 06/10/2017   HGB 15.1 06/10/2017   HCT 44.4 06/10/2017   PLT 249.0 06/10/2017   GLUCOSE 115 (H) 02/02/2018   CHOL 80 06/10/2017   TRIG 58.0 06/10/2017   HDL 35.90 (L) 06/10/2017   LDLDIRECT 30.1 11/14/2010   LDLCALC 33 06/10/2017   ALT 22 06/10/2017   AST 18 06/10/2017   NA 143 02/02/2018   K 4.0 02/02/2018   CL 106 02/02/2018   CREATININE 0.89 02/02/2018   BUN 14 02/02/2018   CO2 23 02/02/2018   TSH 2.08 06/10/2017   PSA 2.34 09/14/2017   INR 1.2 (H) 05/15/2016   HGBA1C 6.0 02/02/2018    Dg Hip Unilat With Pelvis 2-3 Views Right  Result Date: 11/27/2016 CLINICAL DATA:  Right hip pain EXAM: DG HIP (WITH OR WITHOUT PELVIS) 2-3V RIGHT COMPARISON:  10/27/2016 FINDINGS: There is no evidence of hip fracture or dislocation. There is no evidence of arthropathy or other focal bone abnormality. IMPRESSION: Negative. Electronically Signed   By: Franchot Gallo M.D.   On: 11/27/2016 15:58    Assessment & Plan:   There are no diagnoses linked to this encounter.   No orders of the defined types were placed in this  encounter.    Follow-up: No follow-ups on file.  Walker Kehr, MD

## 2018-02-03 NOTE — Assessment & Plan Note (Signed)
Linzess prn 

## 2018-02-03 NOTE — Assessment & Plan Note (Signed)
Free PSA pending

## 2018-02-03 NOTE — Assessment & Plan Note (Signed)
ASA, Lipitor, Exforge 

## 2018-02-06 ENCOUNTER — Other Ambulatory Visit: Payer: Self-pay | Admitting: Neurology

## 2018-02-26 ENCOUNTER — Telehealth: Payer: Self-pay | Admitting: Internal Medicine

## 2018-02-26 NOTE — Telephone Encounter (Signed)
Called and spoke to patient's wife regarding scheduling an AWV. She said that this is not something they would be interested in at this time.

## 2018-03-03 IMAGING — CT CT HEAD W/O CM
4 series · 16 of 47 positions shown, 18 images · non-contrast
Comparison: None.

CLINICAL DATA: Right upper extremity numbness and unsteady gait.
History of hypertension. Diabetes.

EXAM:
CT HEAD WITHOUT CONTRAST
TECHNIQUE: Contiguous axial images were obtained from the base of the skull
through the vertex without intravenous contrast.

[Series 2: head without · axial · non-contrast · 0.46mm/px · z∈[-146,-26]mm · 7 of 32 slices shown, 9 images]
[im 4/32  brain]
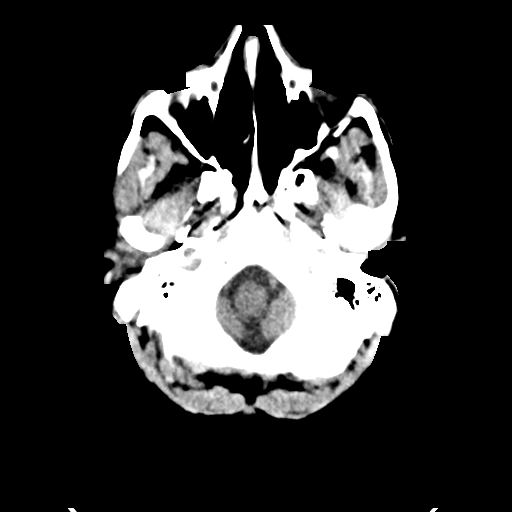
[im 4/32  bone]
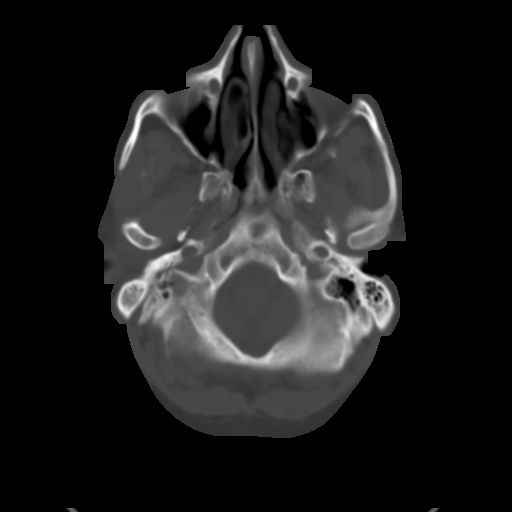
[im 8/32  brain]
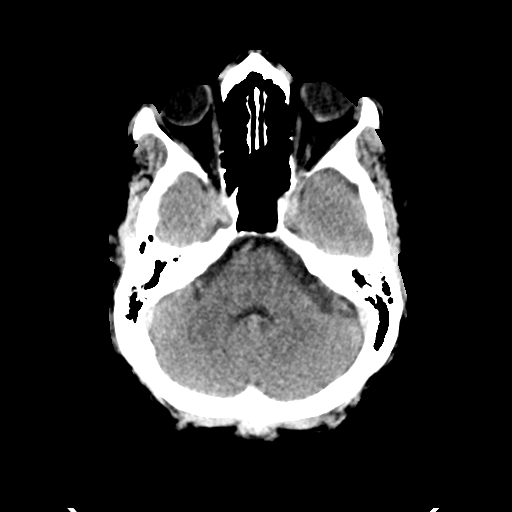
[im 12/32  brain]
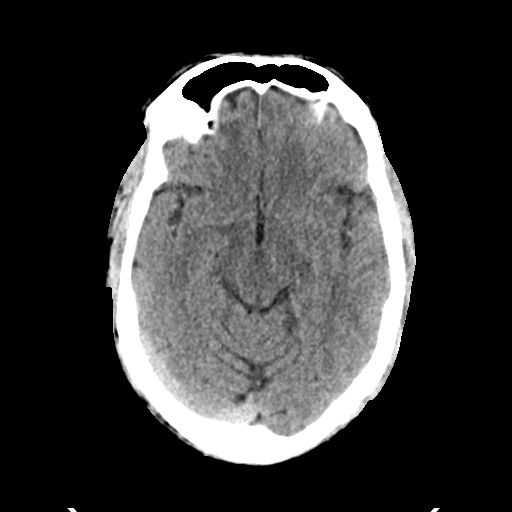
[im 16/32  brain]
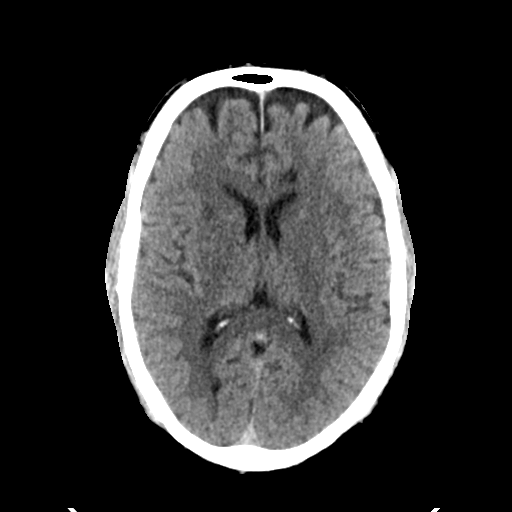
[im 20/32  brain]
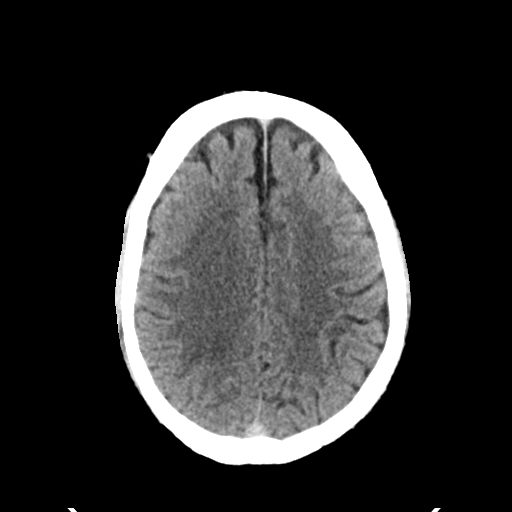
[im 20/32  bone]
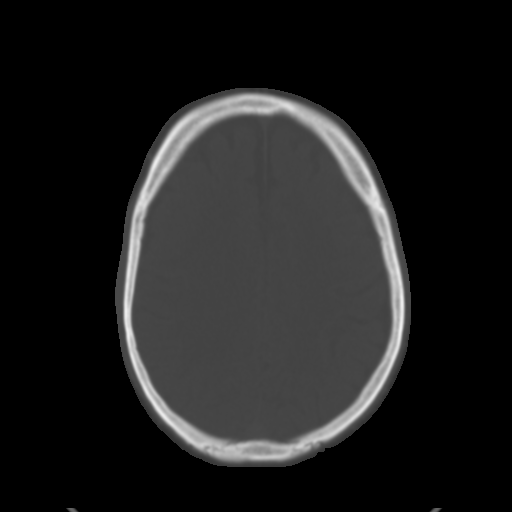
[im 24/32  brain]
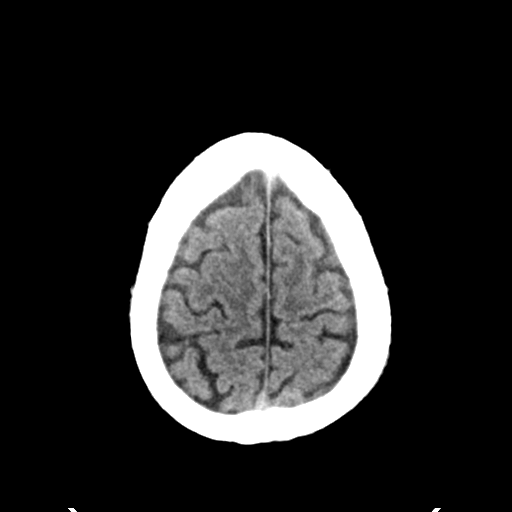
[im 28/32  brain]
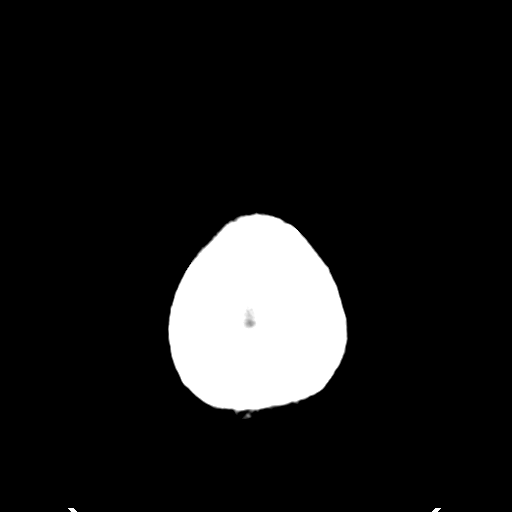

[Series 3: head bone · axial · 0.46mm/px · z∈[-147,-115]mm · 3 of 80 slices shown]
[im 8/80  bone]
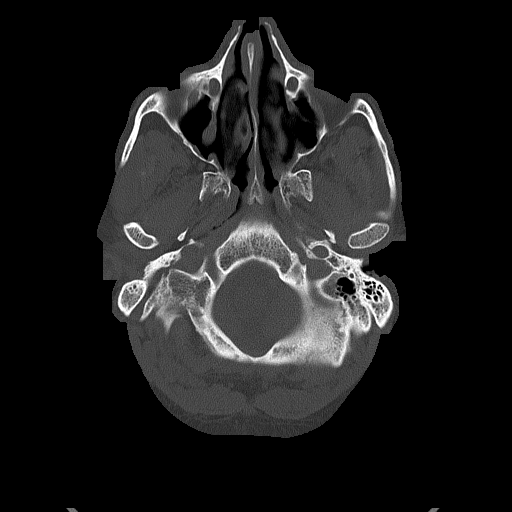
[im 16/80  bone]
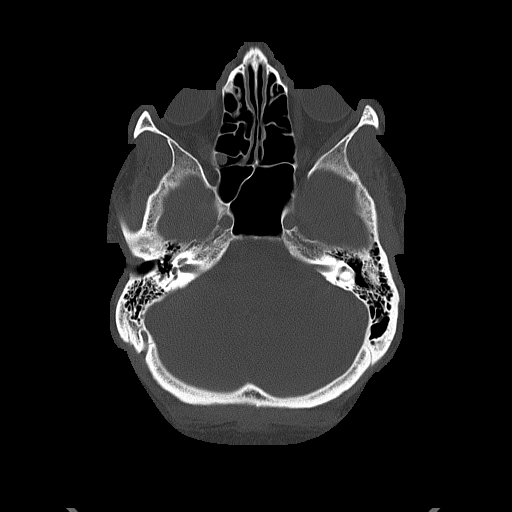
[im 24/80  bone]
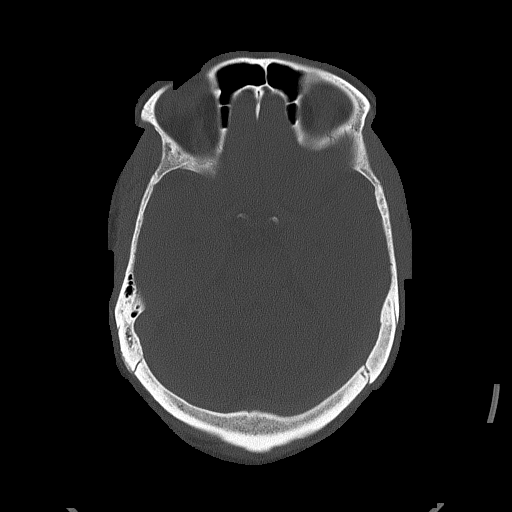

[Series 4: head without cor · coronal · non-contrast · 0.31mm/px · 3 of 67 slices shown]
[im 25/67  brain]
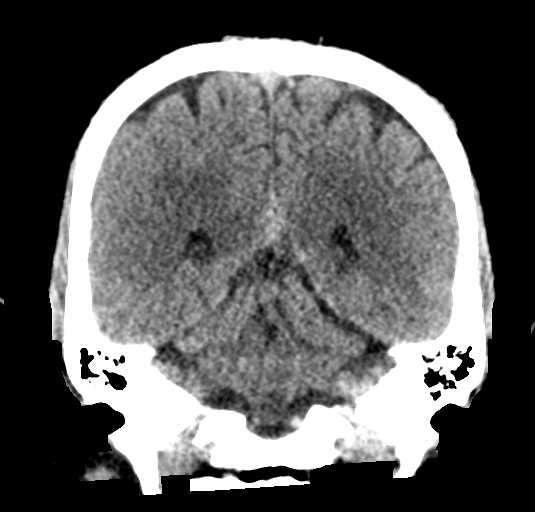
[im 31/67  brain]
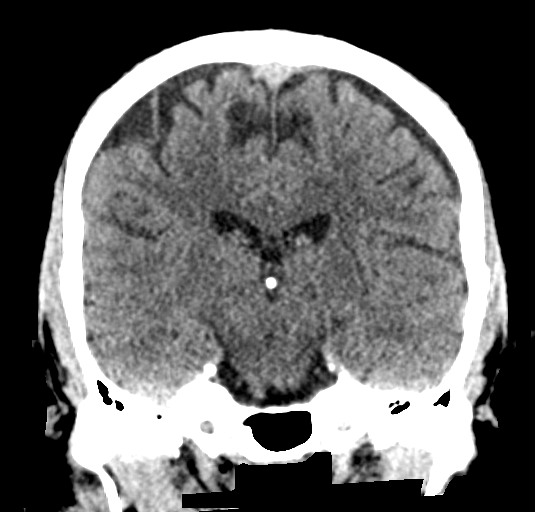
[im 36/67  brain]
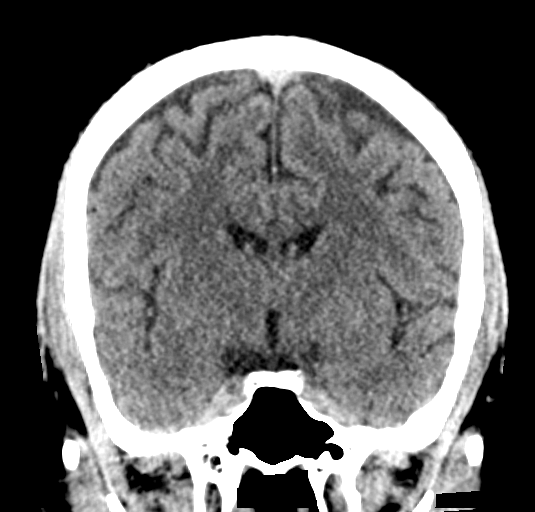

[Series 5: head without sag · sagittal · non-contrast · 0.35mm/px · 3 of 67 slices shown]
[im 23/67  brain]
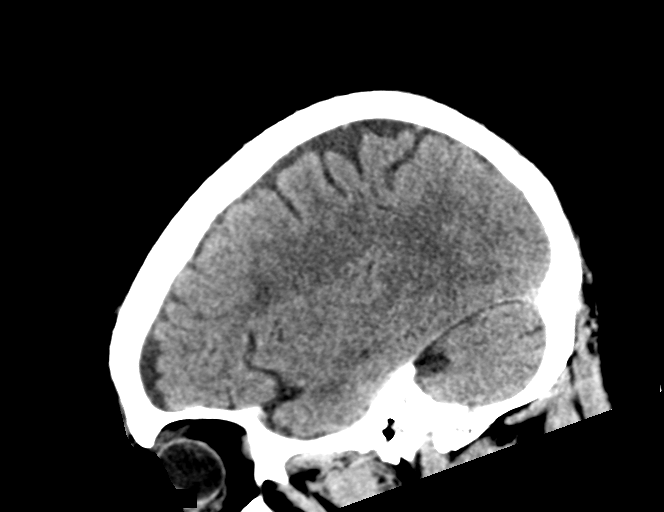
[im 34/67  brain]
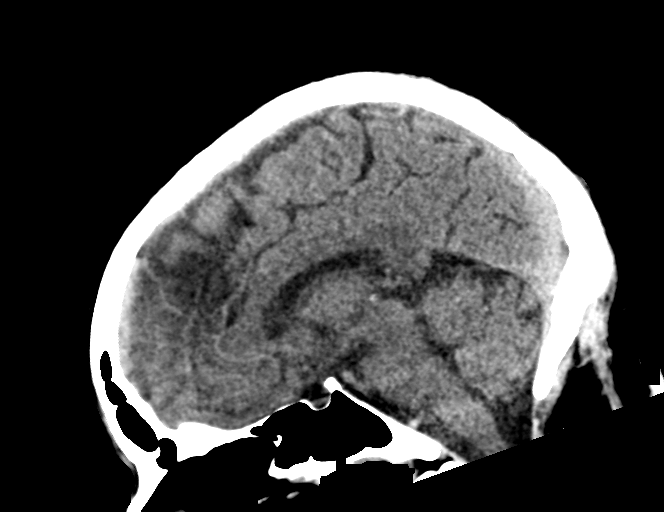
[im 45/67  brain]
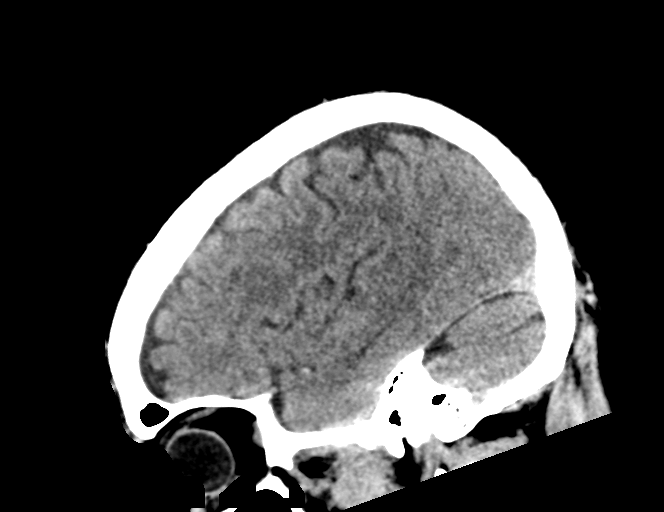

[16 of 47 positions shown; findings below may reference images not displayed]

FINDINGS: Brain: Mild low density in the periventricular white matter likely
related to small vessel disease. No mass lesion, hemorrhage,
hydrocephalus, acute infarct, intra-axial, or extra-axial fluid
collection.

Vascular: No hyperdense vessel or unexpected calcification.

Skull: Normal

Sinuses/Orbits: Normal orbits and globes. Right ethmoid air cell
mucous retention cyst or polyp. Clear mastoid air cells.

Other: None
IMPRESSION: 1.  No acute intracranial abnormality.
2. Mild small vessel ischemic change.

## 2018-03-03 IMAGING — MR MR MRA HEAD W/O CM
10 of 13 series · 30 of 48 positions shown · non-contrast
Comparison: CT same day

CLINICAL DATA: Acute onset of numbness of the right hand and
fingers while exercising.

EXAM:
MRI HEAD WITHOUT CONTRAST
MRA HEAD WITHOUT CONTRAST
TECHNIQUE: Multiplanar, multiecho pulse sequences of the brain and surrounding
structures were obtained without intravenous contrast. Angiographic
images of the head were obtained using MRA technique without
contrast.

[Series 3: DWI · axial · 3.0mm · 0.94mm/px · z∈[-85,+62]mm · 6 of 98 slices shown (1 of 2)]
[im 1/98]
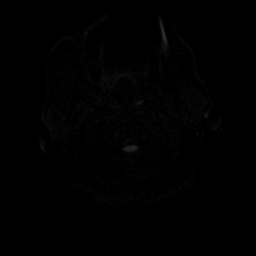
[im 20/98]
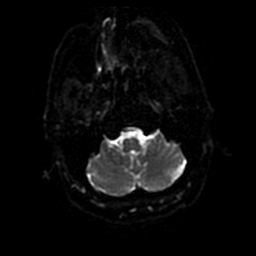
[im 39/98]
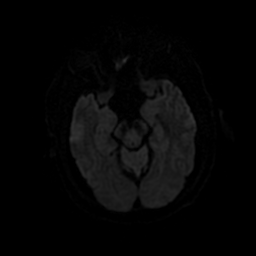
[im 59/98]
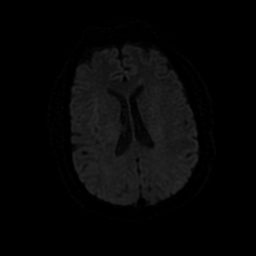
[im 78/98]
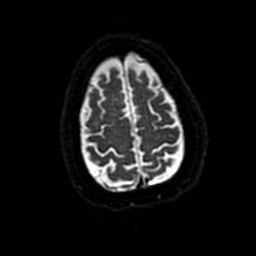
[im 98/98]
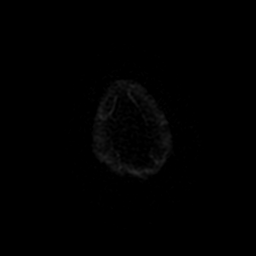

[Series 4: FLAIR · sagittal · 5.0mm · 0.47mm/px · 2 of 23 slices shown (1 of 2)]
[im 1/23]
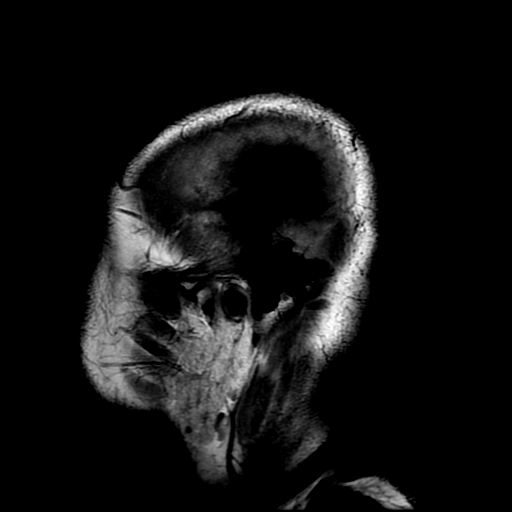
[im 23/23]
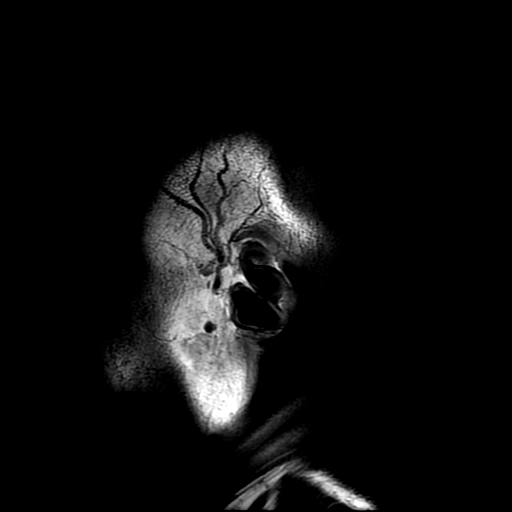

[Series 5: ax (id) 2 · axial · 1.0mm · 0.43mm/px · z∈[-70,-20]mm · 5 of 184 slices shown]
[im 1/184]
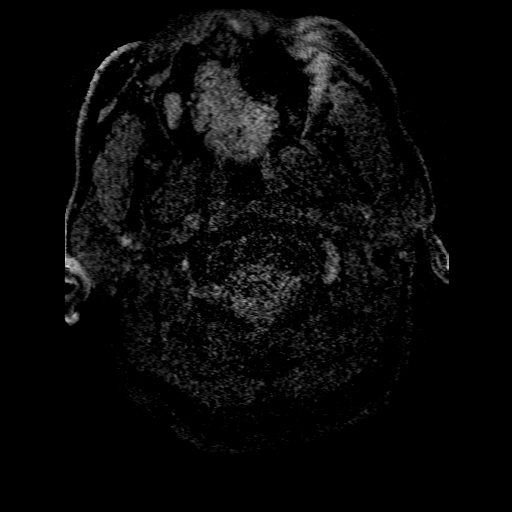
[im 34/184]
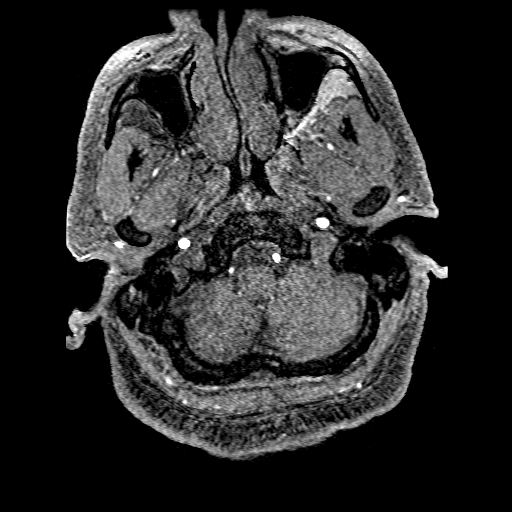
[im 50/184]
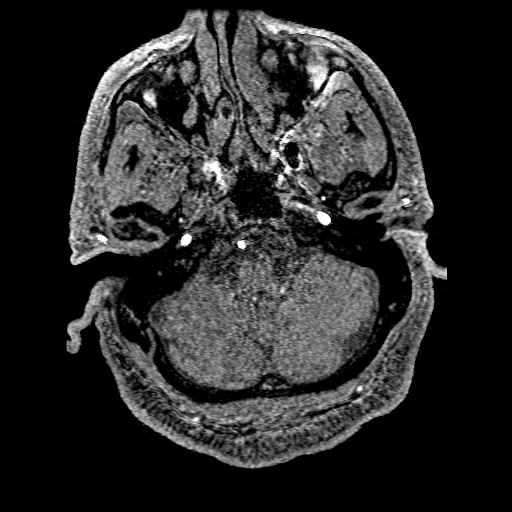
[im 84/184]
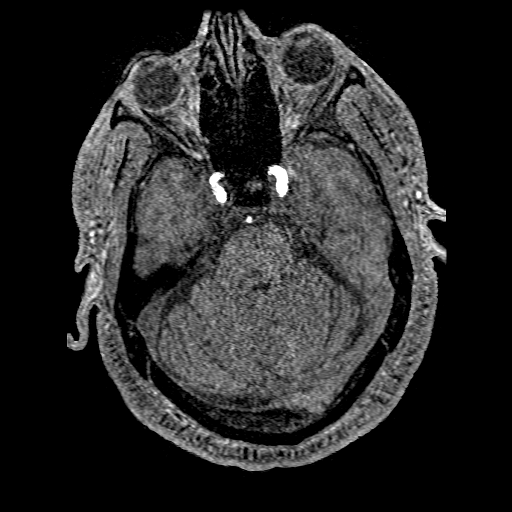
[im 100/184]
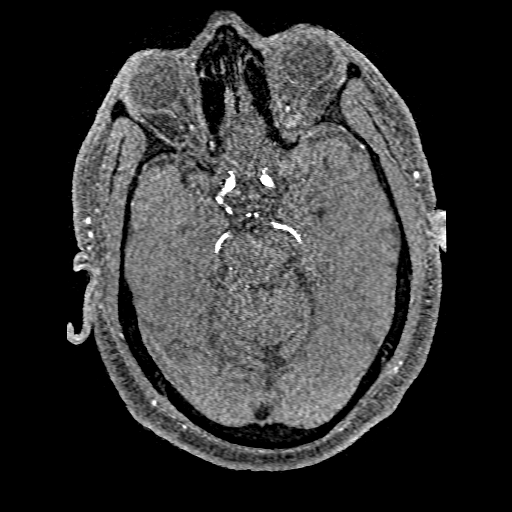

[Series 6: T2 · axial · 5.0mm · 0.47mm/px · z∈[-79,+59]mm · 2 of 24 slices shown (1 of 2)]
[im 1/24]
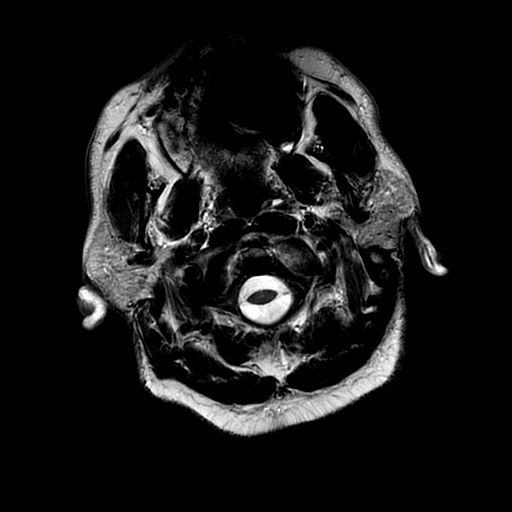
[im 24/24]
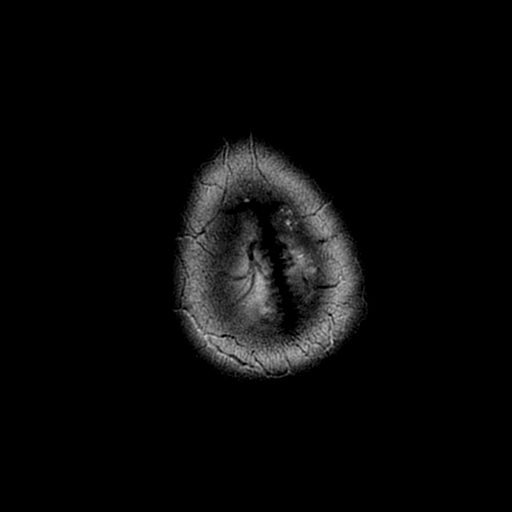

[Series 7: FLAIR · axial · 5.0mm · 0.47mm/px · z∈[-79,+59]mm · 2 of 24 slices shown (2 of 2)]
[im 1/24]
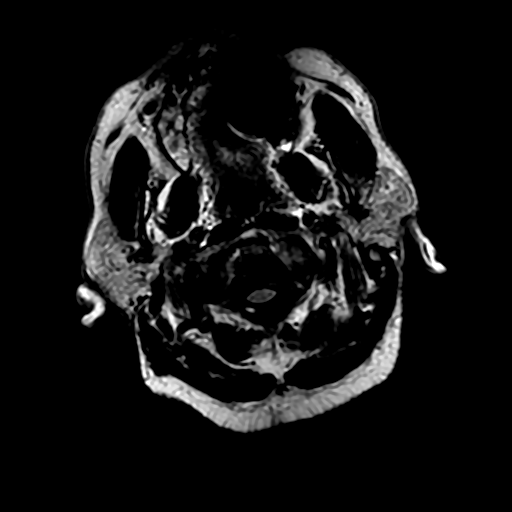
[im 24/24]
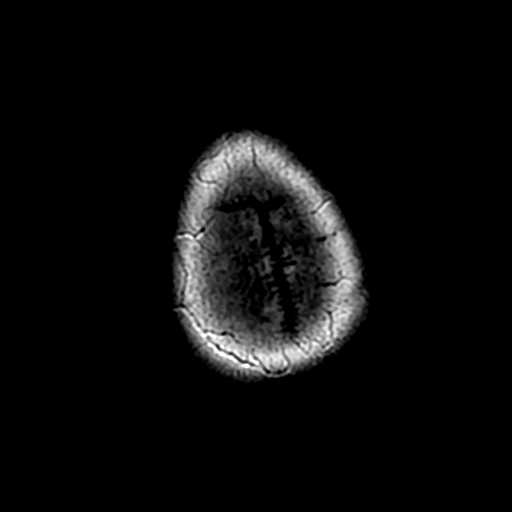

[Series 10: DWI · coronal · 4.0mm · 0.94mm/px · 5 of 72 slices shown (2 of 2)]
[im 1/72]
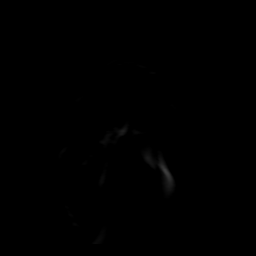
[im 18/72]
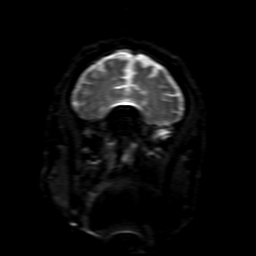
[im 36/72]
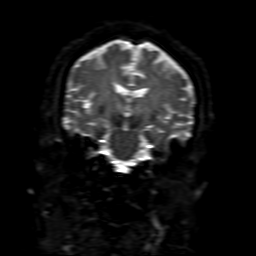
[im 54/72]
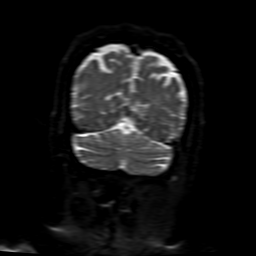
[im 72/72]
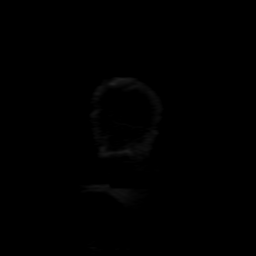

[Series 11: T2 · coronal · 5.0mm · 0.47mm/px · 2 of 28 slices shown (2 of 2)]
[im 1/28]
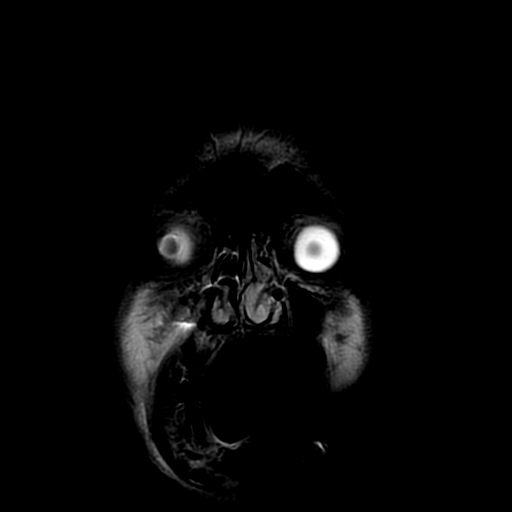
[im 28/28]
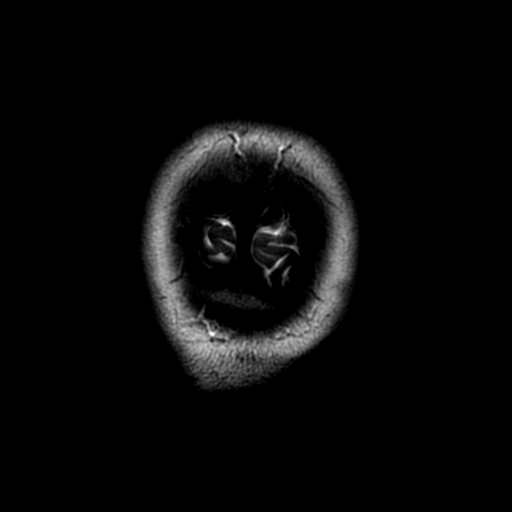

[Series 350: ADC · axial · 3.0mm · 0.94mm/px · z∈[-85,+62]mm · 3 of 49 slices shown (1 of 3)]
[im 1/49]
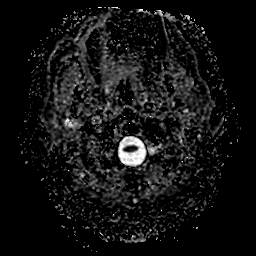
[im 25/49]
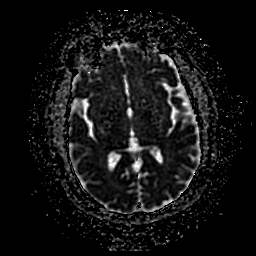
[im 49/49]
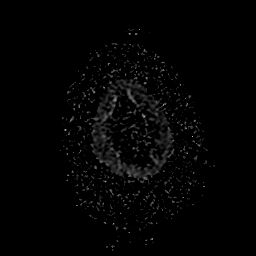

[Series 1050: ADC · coronal · 4.0mm · 0.94mm/px · 2 of 36 slices shown (2 of 3)]
[im 1/36]
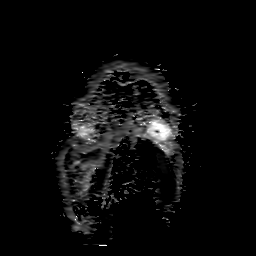
[im 36/36]
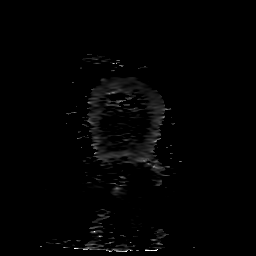

[Series 1250: ADC · coronal · 3.0mm · 0.70mm/px · 1 of 7 slices shown (3 of 3)]
[im 1/7]
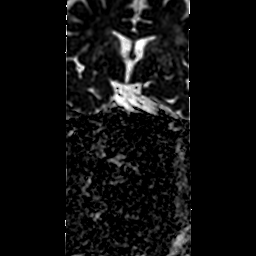

[30 of 48 positions shown; findings below may reference images not displayed]

FINDINGS: MRI HEAD FINDINGS

Brain: Diffusion imaging shows a sub cm acute infarction at the
pontomedullary junction anteriorly just to the left of midline.
There is also a small linear white matter acute infarction in the
left frontal subcortical white matter. There are mild chronic
small-vessel changes of the pons. There are moderate chronic
small-vessel ischemic changes of the cerebral hemispheric white
matter. No large vessel territory infarction. No mass lesion,
hemorrhage, hydrocephalus or extra-axial collection.

Vascular: Major vessels at the base of the brain show flow.

Skull and upper cervical spine: Negative

Sinuses/Orbits: Clear

Other: None significant

MRA HEAD FINDINGS

Both internal carotid arteries are widely patent into the brain. The
anterior and middle cerebral vessels are normal without proximal
stenosis, aneurysm or vascular malformation. Fetal origin of the
left PCA. Large posterior communicating artery on the right.

Both vertebral arteries are patent. The right terminates in PICA.
Left vertebral artery supplies the basilar. No basilar stenosis.
Posterior circulation branch vessels are patent and normal.
IMPRESSION: Acute sub cm infarction at the ventral pontomedullary junction just
to the left of midline.

Small acute left frontal subcortical white matter infarction.

These 2 acute infarctions could be coincidental in due to ordinary
small vessel disease, but emboli from the heart or ascending aorta
could also result in this.

Moderate chronic small-vessel ischemic changes of the cerebral
hemispheric white matter for a age.

Negative intracranial MR angiography of the large and medium size
vessels.

## 2018-03-08 ENCOUNTER — Other Ambulatory Visit: Payer: Self-pay | Admitting: Internal Medicine

## 2018-03-26 ENCOUNTER — Other Ambulatory Visit: Payer: Self-pay | Admitting: Internal Medicine

## 2018-05-05 ENCOUNTER — Other Ambulatory Visit: Payer: Self-pay | Admitting: Internal Medicine

## 2018-05-29 ENCOUNTER — Other Ambulatory Visit: Payer: Self-pay | Admitting: Internal Medicine

## 2018-06-07 ENCOUNTER — Ambulatory Visit: Payer: Medicare Other | Admitting: Endocrinology

## 2018-06-07 ENCOUNTER — Other Ambulatory Visit (INDEPENDENT_AMBULATORY_CARE_PROVIDER_SITE_OTHER): Payer: Medicare Other

## 2018-06-07 ENCOUNTER — Encounter: Payer: Self-pay | Admitting: Endocrinology

## 2018-06-07 ENCOUNTER — Other Ambulatory Visit: Payer: Self-pay

## 2018-06-07 VITALS — BP 134/78 | HR 70 | Ht 70.0 in | Wt 210.6 lb

## 2018-06-07 DIAGNOSIS — E1159 Type 2 diabetes mellitus with other circulatory complications: Secondary | ICD-10-CM

## 2018-06-07 LAB — BASIC METABOLIC PANEL
BUN: 12 mg/dL (ref 6–23)
CHLORIDE: 103 meq/L (ref 96–112)
CO2: 24 mEq/L (ref 19–32)
Calcium: 9.4 mg/dL (ref 8.4–10.5)
Creatinine, Ser: 0.89 mg/dL (ref 0.40–1.50)
GFR: 102.78 mL/min (ref >60.00)
GLUCOSE: 104 mg/dL — AB (ref 70–99)
Potassium: 3.8 mEq/L (ref 3.5–5.1)
Sodium: 137 mEq/L (ref 135–145)

## 2018-06-07 LAB — POCT GLYCOSYLATED HEMOGLOBIN (HGB A1C): Hemoglobin A1C: 5.9 % — AB (ref 4.0–5.6)

## 2018-06-07 LAB — HEMOGLOBIN A1C: Hgb A1c MFr Bld: 6.1 % (ref 4.6–6.5)

## 2018-06-07 NOTE — Progress Notes (Signed)
Subjective:    Patient ID: Christian Clements, male    DOB: 26-Aug-1950, 68 y.o.   MRN: [id number]  HPI Pt returns for f/u of diabetes mellitus: DM type: 2 Dx'ed: 3532 Complications: CVA and PN Therapy: 2 oral meds.  DKA: never Severe hypoglycemia: never.  Pancreatitis: never.  Other: he took insulin for 1 month, in 2017.   Interval history: he says cbg's are well-controlled.  pt states he feels well in general.  Past Medical History:  Diagnosis Date  . Diabetes mellitus   . Hypertension   . Prostatitis 2011    Past Surgical History:  Procedure Laterality Date  . None      Social History   Socioeconomic History  . Marital status: Married    Spouse name: Not on file  . Number of children: 0  . Years of education: 16  . Highest education level: Bachelor's degree (e.g., BA, AB, BS)  Occupational History  . Occupation: PE Product manager: [employer]  Social Needs  . Financial resource strain: Not on file  . Food insecurity:    Worry: Not on file    Inability: Not on file  . Transportation needs:    Medical: Not on file    Non-medical: Not on file  Tobacco Use  . Smoking status: Former Research scientist (life sciences)  . Smokeless tobacco: Never Used  Substance and Sexual Activity  . Alcohol use: Yes    Alcohol/week: 3.0 standard drinks    Types: 3 Glasses of wine per week    Comment: Socially  . Drug use: No  . Sexual activity: Yes  Lifestyle  . Physical activity:    Days per week: Not on file    Minutes per session: Not on file  . Stress: Not on file  Relationships  . Social connections:    Talks on phone: Not on file    Gets together: Not on file    Attends religious service: Not on file    Active member of club or organization: Not on file    Attends meetings of clubs or organizations: Not on file    Relationship status: Not on file  . Intimate partner violence:    Fear of current or ex partner: Not on file    Emotionally abused: Not on file    Physically  abused: Not on file    Forced sexual activity: Not on file  Other Topics Concern  . Not on file  Social History Narrative   Regular exercise- yes.  Lives with wife in a 2 story home.  Has no children.     Retired Careers information officer.     Education: college.        Current Outpatient Medications on File Prior to Visit  Medication Sig Dispense Refill  . amLODipine-valsartan (EXFORGE) 5-320 MG tablet TAKE 1 TABLET BY MOUTH EVERY DAY 90 tablet 3  . aspirin 325 MG tablet Take 1 tablet (325 mg total) by mouth daily. 100 tablet 3  . atorvastatin (LIPITOR) 20 MG tablet TAKE 1 TABLET (20 MG TOTAL) BY MOUTH DAILY. 90 tablet 1  . b complex vitamins tablet Take 1 tablet by mouth daily. 100 tablet 3  . baclofen (LIORESAL) 10 MG tablet Take 1 tablet (10 mg total) by mouth 3 (three) times daily. 270 tablet 3  . baclofen (LIORESAL) 10 MG tablet TAKE 1 TABLET BY MOUTH TWICE A DAY 180 tablet 1  . blood glucose meter kit and supplies KIT Dispense based on  patient and insurance preference. Use two times daily as directed. (FOR ICD-9 250.00, 250.01). 1 each 0  . Cholecalciferol 1000 UNITS tablet Take 1,000 Units by mouth daily.      Marland Kitchen doxazosin (CARDURA) 2 MG tablet TAKE 1 TABLET BY MOUTH EVERY DAY 90 tablet 3  . gabapentin (NEURONTIN) 100 MG capsule Take 2 capsules (200 mg total) by mouth at bedtime. 60 capsule 3  . glucose blood (ONETOUCH VERIO) test strip Use to check blood sugars twice a day Dx E11.9 Yearly physical w/labs are due must see MD for refills 100 each 0  . linagliptin (TRADJENTA) 5 MG TABS tablet Take 1 tablet (5 mg total) by mouth daily. 30 tablet 11  . LINZESS 290 MCG CAPS capsule TAKE ONE CAPSULE BY MOUTH EVERY DAY AS NEEDED 90 capsule 3  . metFORMIN (GLUCOPHAGE) 1000 MG tablet Take 1 tablet (1,000 mg total) by mouth 2 (two) times daily with a meal. 180 tablet 3  . polyethylene glycol powder (GLYCOLAX/MIRALAX) powder Take 17 g by mouth 2 (two) times daily as needed for moderate constipation. 500 g  11  . PROBIOTIC CAPS Take 1 capsule by mouth daily.      . sildenafil (VIAGRA) 100 MG tablet Take 1 tablet (100 mg total) by mouth daily as needed. 10 tablet 11  . triamcinolone cream (KENALOG) 0.1 % Apply 1 application topically 2 (two) times daily. 80 g 3   No current facility-administered medications on file prior to visit.     Allergies  Allergen Reactions  . Accuretic [Quinapril-Hydrochlorothiazide]     Drug rash ?    Family History  Problem Relation Age of Onset  . Cancer Mother 78       leukemia  . Pancreatic cancer Father   . Hypertension Other   . Diabetes Maternal Grandfather   . Stroke Sister   . Cancer Brother   . HIV/AIDS Brother     BP 134/78 (BP Location: Left Arm, Patient Position: Sitting, Cuff Size: Large)   Pulse 70   Ht _0  (1.778 m)   Wt 210 lb 9.6 oz (95.5 kg)   SpO2 96%   BMI 30.22 kg/m    Review of Systems Denies nausea    Objective:   Physical Exam VITAL SIGNS:  See vs page GENERAL: no distress Pulses: foot pulses are intact bilaterally.   MSK: no deformity of the feet or ankles.  CV: trac bilat leg edema Skin:  no ulcer on the feet or ankles, but the skin is dry.  normal color and temp on the feet and ankles Neuro: sensation is intact to touch on the feet and ankles, but decreased from normal  Ext: There is bilateral onychomycosis of the toenails. There is ecchymosis under the left great toenail.   A1c=5.9%  Lab Results  Component Value Date   CREATININE 0.89 02/02/2018   BUN 14 02/02/2018   NA 143 02/02/2018   K 4.0 02/02/2018   CL 106 02/02/2018   CO2 23 02/02/2018        Assessment & Plan:  Type 2 DM, with CVA: well-controlled Edema: This limits rx options    Patient Instructions  check your blood sugar once a day.  vary the time of day when you check, between before the 3 meals, and at bedtime.  also check if you have symptoms of your blood sugar being too high or too low.  please keep a record of the readings and  bring it to your next appointment  here (or you can bring the meter itself).  You can write it on any piece of paper.  please call us sooner if your blood sugar goes below 70, or if you have a lot of readings over 200. Please continue the same medications.  Please come back for a follow-up appointment in 6 months.

## 2018-06-07 NOTE — Patient Instructions (Signed)

## 2018-06-09 ENCOUNTER — Other Ambulatory Visit: Payer: Self-pay | Admitting: Endocrinology

## 2018-06-11 ENCOUNTER — Ambulatory Visit: Payer: Medicare Other | Admitting: Neurology

## 2018-06-15 ENCOUNTER — Encounter: Payer: Medicare Other | Admitting: Internal Medicine

## 2018-08-13 ENCOUNTER — Ambulatory Visit (INDEPENDENT_AMBULATORY_CARE_PROVIDER_SITE_OTHER): Payer: Medicare Other | Admitting: Internal Medicine

## 2018-08-13 ENCOUNTER — Other Ambulatory Visit: Payer: Self-pay

## 2018-08-13 ENCOUNTER — Encounter: Payer: Self-pay | Admitting: Internal Medicine

## 2018-08-13 VITALS — BP 144/82 | HR 62 | Temp 97.8°F | Ht 70.0 in | Wt 210.0 lb

## 2018-08-13 DIAGNOSIS — R1319 Other dysphagia: Secondary | ICD-10-CM

## 2018-08-13 DIAGNOSIS — R1310 Dysphagia, unspecified: Secondary | ICD-10-CM | POA: Diagnosis not present

## 2018-08-13 DIAGNOSIS — I69359 Hemiplegia and hemiparesis following cerebral infarction affecting unspecified side: Secondary | ICD-10-CM | POA: Diagnosis not present

## 2018-08-13 DIAGNOSIS — E1159 Type 2 diabetes mellitus with other circulatory complications: Secondary | ICD-10-CM

## 2018-08-13 DIAGNOSIS — E1169 Type 2 diabetes mellitus with other specified complication: Secondary | ICD-10-CM

## 2018-08-13 DIAGNOSIS — R350 Frequency of micturition: Secondary | ICD-10-CM

## 2018-08-13 DIAGNOSIS — N401 Enlarged prostate with lower urinary tract symptoms: Secondary | ICD-10-CM

## 2018-08-13 DIAGNOSIS — E785 Hyperlipidemia, unspecified: Secondary | ICD-10-CM

## 2018-08-13 DIAGNOSIS — I10 Essential (primary) hypertension: Secondary | ICD-10-CM

## 2018-08-13 MED ORDER — PANTOPRAZOLE SODIUM 40 MG PO TBEC: 40.0000 mg | DELAYED_RELEASE_TABLET | Freq: Every day | ORAL | 3 refills | Status: DC

## 2018-08-13 NOTE — Assessment & Plan Note (Signed)
[place] 

## 2018-08-13 NOTE — Assessment & Plan Note (Signed)
Protonix GI ref Dr Fuller Plan

## 2018-08-13 NOTE — Progress Notes (Signed)
Subjective:  Patient ID: Christian Clements, male    DOB: 02-19-1951  Age: 68 y.o. MRN: [id number]  CC: No chief complaint on file.   HPI Ranger Petrich presents for dysphagia x 1 week. No pain. Meds "hang up" in the throat. F/u CVA, DM, HTN  Outpatient Medications Prior to Visit  Medication Sig Dispense Refill  . amLODipine-valsartan (EXFORGE) 5-320 MG tablet TAKE 1 TABLET BY MOUTH EVERY DAY 90 tablet 3  . aspirin 325 MG tablet Take 1 tablet (325 mg total) by mouth daily. 100 tablet 3  . atorvastatin (LIPITOR) 20 MG tablet TAKE 1 TABLET (20 MG TOTAL) BY MOUTH DAILY. 90 tablet 1  . b complex vitamins tablet Take 1 tablet by mouth daily. 100 tablet 3  . baclofen (LIORESAL) 10 MG tablet Take 1 tablet (10 mg total) by mouth 3 (three) times daily. 270 tablet 3  . baclofen (LIORESAL) 10 MG tablet TAKE 1 TABLET BY MOUTH TWICE A DAY 180 tablet 1  . blood glucose meter kit and supplies KIT Dispense based on patient and insurance preference. Use two times daily as directed. (FOR ICD-9 250.00, 250.01). 1 each 0  . Cholecalciferol 1000 UNITS tablet Take 1,000 Units by mouth daily.      Marland Kitchen doxazosin (CARDURA) 2 MG tablet TAKE 1 TABLET BY MOUTH EVERY DAY 90 tablet 3  . gabapentin (NEURONTIN) 100 MG capsule Take 2 capsules (200 mg total) by mouth at bedtime. 60 capsule 3  . glucose blood (ONETOUCH VERIO) test strip Use to check blood sugars twice a day Dx E11.9 Yearly physical w/labs are due must see MD for refills 100 each 0  . LINZESS 290 MCG CAPS capsule TAKE ONE CAPSULE BY MOUTH EVERY DAY AS NEEDED 90 capsule 3  . metFORMIN (GLUCOPHAGE) 1000 MG tablet Take 1 tablet (1,000 mg total) by mouth 2 (two) times daily with a meal. 180 tablet 3  . polyethylene glycol powder (GLYCOLAX/MIRALAX) powder Take 17 g by mouth 2 (two) times daily as needed for moderate constipation. 500 g 11  . PROBIOTIC CAPS Take 1 capsule by mouth daily.      . sildenafil (VIAGRA) 100 MG tablet Take 1 tablet (100 mg total) by mouth  daily as needed. 10 tablet 11  . TRADJENTA 5 MG TABS tablet TAKE 1 TABLET BY MOUTH EVERY DAY 30 tablet 11  . triamcinolone cream (KENALOG) 0.1 % Apply 1 application topically 2 (two) times daily. 80 g 3   No facility-administered medications prior to visit.     ROS: Review of Systems  Constitutional: Negative for appetite change, fatigue and unexpected weight change.  HENT: Negative for congestion, nosebleeds, sneezing, sore throat and trouble swallowing.   Eyes: Negative for itching and visual disturbance.  Respiratory: Negative for cough.   Cardiovascular: Negative for chest pain, palpitations and leg swelling.  Gastrointestinal: Negative for abdominal distention, blood in stool, diarrhea and nausea.  Genitourinary: Negative for frequency and hematuria.  Musculoskeletal: Positive for gait problem. Negative for back pain, joint swelling and neck pain.  Skin: Negative for rash.  Neurological: Negative for dizziness, tremors, speech difficulty and weakness.  Psychiatric/Behavioral: Negative for agitation, dysphoric mood, sleep disturbance and suicidal ideas. The patient is not nervous/anxious.     Objective:  BP (!) 144/82 (BP Location: Left Arm, Patient Position: Sitting, Cuff Size: Large)   Pulse 62   Temp 97.8 F (36.6 C) (Oral)   Ht 5' 10"  (1.778 m)   Wt 210 lb (95.3 kg)   SpO2 97%  BMI 30.13 kg/m   BP Readings from Last 3 Encounters:  08/13/18 (!) 144/82  06/07/18 134/78  02/03/18 130/76    Wt Readings from Last 3 Encounters:  08/13/18 210 lb (95.3 kg)  06/07/18 210 lb 9.6 oz (95.5 kg)  02/03/18 207 lb (93.9 kg)    Physical Exam Constitutional:      General: He is not in acute distress.    Appearance: He is well-developed.     Comments: NAD  Eyes:     Conjunctiva/sclera: Conjunctivae normal.     Pupils: Pupils are equal, round, and reactive to light.  Neck:     Musculoskeletal: Normal range of motion.     Thyroid: No thyromegaly.     Vascular: No JVD.   Cardiovascular:     Rate and Rhythm: Normal rate and regular rhythm.     Heart sounds: Normal heart sounds. No murmur. No friction rub. No gallop.   Pulmonary:     Effort: Pulmonary effort is normal. No respiratory distress.     Breath sounds: Normal breath sounds. No wheezing or rales.  Chest:     Chest wall: No tenderness.  Abdominal:     General: Bowel sounds are normal. There is no distension.     Palpations: Abdomen is soft. There is no mass.     Tenderness: There is no abdominal tenderness. There is no guarding or rebound.  Musculoskeletal: Normal range of motion.        General: No tenderness.  Lymphadenopathy:     Cervical: No cervical adenopathy.  Skin:    General: Skin is warm and dry.     Findings: No rash.  Neurological:     Mental Status: He is alert and oriented to person, place, and time.     Cranial Nerves: No cranial nerve deficit.     Motor: No abnormal muscle tone.     Coordination: Coordination normal.     Gait: Gait normal.     Deep Tendon Reflexes: Reflexes are normal and symmetric.  Psychiatric:        Behavior: Behavior normal.        Thought Content: Thought content normal.        Judgment: Judgment normal.     Lab Results  Component Value Date   WBC 5.7 06/10/2017   HGB 15.1 06/10/2017   HCT 44.4 06/10/2017   PLT 249.0 06/10/2017   GLUCOSE 104 (H) 06/07/2018   CHOL 80 06/10/2017   TRIG 58.0 06/10/2017   HDL 35.90 (L) 06/10/2017   LDLDIRECT 30.1 11/14/2010   LDLCALC 33 06/10/2017   ALT 22 06/10/2017   AST 18 06/10/2017   NA 137 06/07/2018   K 3.8 06/07/2018   CL 103 06/07/2018   CREATININE 0.89 06/07/2018   BUN 12 06/07/2018   CO2 24 06/07/2018   TSH 2.08 06/10/2017   PSA 2.34 09/14/2017   INR 1.2 (H) 05/15/2016   HGBA1C 6.1 06/07/2018    Dg Hip Unilat With Pelvis 2-3 Views Right  Result Date: 11/27/2016 CLINICAL DATA:  Right hip pain EXAM: DG HIP (WITH OR WITHOUT PELVIS) 2-3V RIGHT COMPARISON:  10/27/2016 FINDINGS: There is no  evidence of hip fracture or dislocation. There is no evidence of arthropathy or other focal bone abnormality. IMPRESSION: Negative. Electronically Signed   By: Franchot Gallo M.D.   On: 11/27/2016 15:58    Assessment & Plan:   There are no diagnoses linked to this encounter.   No orders of the defined types were  placed in this encounter.    Follow-up: No follow-ups on file.  Walker Kehr, MD

## 2018-08-13 NOTE — Assessment & Plan Note (Signed)
Metformin, Trajenta 

## 2018-08-13 NOTE — Assessment & Plan Note (Signed)
ASA, Lipitor, Exforge

## 2018-08-25 ENCOUNTER — Encounter: Payer: Self-pay | Admitting: General Surgery

## 2018-08-26 ENCOUNTER — Encounter: Payer: Self-pay | Admitting: Gastroenterology

## 2018-08-26 ENCOUNTER — Other Ambulatory Visit: Payer: Self-pay

## 2018-08-26 ENCOUNTER — Ambulatory Visit (INDEPENDENT_AMBULATORY_CARE_PROVIDER_SITE_OTHER): Payer: Medicare Other | Admitting: Gastroenterology

## 2018-08-26 VITALS — Ht 70.0 in | Wt 210.0 lb

## 2018-08-26 DIAGNOSIS — K219 Gastro-esophageal reflux disease without esophagitis: Secondary | ICD-10-CM | POA: Diagnosis not present

## 2018-08-26 DIAGNOSIS — R1310 Dysphagia, unspecified: Secondary | ICD-10-CM | POA: Diagnosis not present

## 2018-08-26 DIAGNOSIS — K5900 Constipation, unspecified: Secondary | ICD-10-CM

## 2018-08-26 NOTE — Patient Instructions (Signed)
You have been scheduled for an endoscopy. Please follow written instructions given to you at your visit today. If you use inhalers (even only as needed), please bring them with you on the day of your procedure.  Thank you for choosing me and [facility].  Malcolm T. Stark, Jr., MD., FACG  

## 2018-08-26 NOTE — Progress Notes (Signed)
History of Present Illness: This is a 68 year old male referred by Plotnikov, Evie Lacks, MD for the evaluation of dysphagia.  Patient relates a 2-week history of indigestion and difficulty swallowing solid foods.  He was started on pantoprazole with mild improvement in symptoms however his solid food dysphagia persists.  He has chronic constipation that is currently adequately controlled on MiraLAX.  No other gastrointestinal complaints.  Last colonoscopy performed in 2002 showed small internal hemorrhoids, otherwise normal.  Denies weight loss, abdominal pain, diarrhea, change in stool caliber, melena, hematochezia, nausea, vomiting, chest pain.     Allergies  Allergen Reactions  . Accuretic [Quinapril-Hydrochlorothiazide]     Drug rash ?   Outpatient Medications Prior to Visit  Medication Sig Dispense Refill  . amLODipine-valsartan (EXFORGE) 5-320 MG tablet TAKE 1 TABLET BY MOUTH EVERY DAY 90 tablet 3  . aspirin 325 MG tablet Take 1 tablet (325 mg total) by mouth daily. 100 tablet 3  . atorvastatin (LIPITOR) 20 MG tablet TAKE 1 TABLET (20 MG TOTAL) BY MOUTH DAILY. 90 tablet 1  . b complex vitamins tablet Take 1 tablet by mouth daily. 100 tablet 3  . baclofen (LIORESAL) 10 MG tablet Take 1 tablet (10 mg total) by mouth 3 (three) times daily. 270 tablet 3  . baclofen (LIORESAL) 10 MG tablet TAKE 1 TABLET BY MOUTH TWICE A DAY 180 tablet 1  . blood glucose meter kit and supplies KIT Dispense based on patient and insurance preference. Use two times daily as directed. (FOR ICD-9 250.00, 250.01). 1 each 0  . Cholecalciferol 1000 UNITS tablet Take 1,000 Units by mouth daily.      Marland Kitchen doxazosin (CARDURA) 2 MG tablet TAKE 1 TABLET BY MOUTH EVERY DAY 90 tablet 3  . gabapentin (NEURONTIN) 100 MG capsule Take 2 capsules (200 mg total) by mouth at bedtime. 60 capsule 3  . glucose blood (ONETOUCH VERIO) test strip Use to check blood sugars twice a day Dx E11.9 Yearly physical w/labs are due must see MD  for refills 100 each 0  . LINZESS 290 MCG CAPS capsule TAKE ONE CAPSULE BY MOUTH EVERY DAY AS NEEDED 90 capsule 3  . metFORMIN (GLUCOPHAGE) 1000 MG tablet Take 1 tablet (1,000 mg total) by mouth 2 (two) times daily with a meal. 180 tablet 3  . pantoprazole (PROTONIX) 40 MG tablet Take 1 tablet (40 mg total) by mouth daily. 30 tablet 3  . polyethylene glycol powder (GLYCOLAX/MIRALAX) powder Take 17 g by mouth 2 (two) times daily as needed for moderate constipation. 500 g 11  . PROBIOTIC CAPS Take 1 capsule by mouth daily.      . sildenafil (VIAGRA) 100 MG tablet Take 1 tablet (100 mg total) by mouth daily as needed. 10 tablet 11  . TRADJENTA 5 MG TABS tablet TAKE 1 TABLET BY MOUTH EVERY DAY 30 tablet 11  . triamcinolone cream (KENALOG) 0.1 % Apply 1 application topically 2 (two) times daily. 80 g 3   No facility-administered medications prior to visit.    Past Medical History:  Diagnosis Date  . Diabetes mellitus   . Hypertension   . Prostatitis 2011   Past Surgical History:  Procedure Laterality Date  . None     Social History   Socioeconomic History  . Marital status: Married    Spouse name: Not on file  . Number of children: 0  . Years of education: 16  . Highest education level: Bachelor's degree (e.g., BA, AB, BS)  Occupational History  . Occupation: PE Product manager: [employer]  Social Needs  . Financial resource strain: Not on file  . Food insecurity:    Worry: Not on file    Inability: Not on file  . Transportation needs:    Medical: Not on file    Non-medical: Not on file  Tobacco Use  . Smoking status: Former Research scientist (life sciences)  . Smokeless tobacco: Never Used  Substance and Sexual Activity  . Alcohol use: Yes    Alcohol/week: 3.0 standard drinks    Types: 3 Glasses of wine per week    Comment: Socially  . Drug use: No  . Sexual activity: Yes  Lifestyle  . Physical activity:    Days per week: Not on file    Minutes per session: Not on file  .  Stress: Not on file  Relationships  . Social connections:    Talks on phone: Not on file    Gets together: Not on file    Attends religious service: Not on file    Active member of club or organization: Not on file    Attends meetings of clubs or organizations: Not on file    Relationship status: Not on file  Other Topics Concern  . Not on file  Social History Narrative   Regular exercise- yes.  Lives with wife in a 2 story home.  Has no children.     Retired Careers information officer.     Education: college.       Family History  Problem Relation Age of Onset  . Cancer Mother 57       leukemia  . Pancreatic cancer Father   . Hypertension Other   . Diabetes Maternal Grandfather   . Stroke Sister   . Cancer Brother   . HIV/AIDS Brother   . Colon cancer Neg Hx   . Esophageal cancer Neg Hx   . Liver cancer Neg Hx   . Rectal cancer Neg Hx   . Stomach cancer Neg Hx       Review of Systems: Pertinent positive and negative review of systems were noted in the above HPI section. All other review of systems were otherwise negative.   Physical Exam: Telemedicine - not performed    Assessment and Recommendations:  1. New onset solid food dysphagia and GERD.  R/O stricture, esophagitis, neoplasm.  Continue pantoprazole 40 mg daily.  Follow antireflux measures. Schedule EGD with possible dilation.  Advised a soft diet avoiding meats and breads until EGD is completed. The risks (including bleeding, perforation, infection, missed lesions, medication reactions and possible hospitalization or surgery if complications occur), benefits, and alternatives to endoscopy with possible biopsy and possible dilation were discussed with the patient and they consent to proceed.   2. Constipation. Miralax bid.   3. CRC screening, address at REV after dysphagia evaluation completed.    These services were provided via telemedicine, audio only per patient request.  The patient was at home and the provider was in  the office, alone.  We discussed the limitations of evaluation and management by telemedicine and the availability of in person appointments.  Patient consented for this telemedicine visit and is aware of possible charges for this service.  Office CMA or LPN participated in this telemedicine service.  Time spent on call: 8 minutes    cc: Plotnikov, Evie Lacks, MD [address] [place], [place] [address]

## 2018-08-28 ENCOUNTER — Other Ambulatory Visit: Payer: Self-pay | Admitting: Endocrinology

## 2018-08-30 ENCOUNTER — Telehealth: Payer: Self-pay

## 2018-08-30 NOTE — Telephone Encounter (Signed)
Left message

## 2018-08-31 ENCOUNTER — Telehealth: Payer: Self-pay | Admitting: *Deleted

## 2018-08-31 NOTE — Telephone Encounter (Signed)
Unable to reach patient. LMOM reminding of procedure arrival time.  Also, asked pt to wear a mask into the building and informed him that care partner will be waiting in the car during procedure- reminded that care partner will need to stay in parking lot during procedure.   If they feel like they will be too hot waiting in the car; they may wait in the lobby.  We want them to wear a mask (we do not have any that we can provide them), practice social distancing, and we will check their temperatures when they get here.   Asked patient to call back if any of the following is a yes- travel, fever or any respiratory issues in the past 14 days, or have had any close contacts/family members diagnosed with the covid 19.

## 2018-09-01 ENCOUNTER — Other Ambulatory Visit: Payer: Self-pay

## 2018-09-01 ENCOUNTER — Ambulatory Visit (AMBULATORY_SURGERY_CENTER): Payer: Medicare Other | Admitting: Gastroenterology

## 2018-09-01 ENCOUNTER — Encounter: Payer: Self-pay | Admitting: Gastroenterology

## 2018-09-01 VITALS — BP 149/79 | HR 58 | Temp 98.5°F | Resp 19 | Ht 70.0 in | Wt 210.0 lb

## 2018-09-01 DIAGNOSIS — K2970 Gastritis, unspecified, without bleeding: Secondary | ICD-10-CM | POA: Diagnosis not present

## 2018-09-01 DIAGNOSIS — K3189 Other diseases of stomach and duodenum: Secondary | ICD-10-CM | POA: Diagnosis not present

## 2018-09-01 DIAGNOSIS — R1310 Dysphagia, unspecified: Secondary | ICD-10-CM

## 2018-09-01 DIAGNOSIS — K219 Gastro-esophageal reflux disease without esophagitis: Secondary | ICD-10-CM

## 2018-09-01 MED ORDER — SODIUM CHLORIDE 0.9 % IV SOLN: 500.0000 mL | Freq: Once | INTRAVENOUS | Status: DC

## 2018-09-01 NOTE — Progress Notes (Signed)
Temps-cw Vital signs-jb

## 2018-09-01 NOTE — Progress Notes (Signed)
Called to room to assist during endoscopic procedure.  Patient ID and intended procedure confirmed with present staff. Received instructions for my participation in the procedure from the performing physician.  

## 2018-09-01 NOTE — Progress Notes (Signed)
Report given to PACU, vss 

## 2018-09-01 NOTE — Patient Instructions (Signed)
YOU HAD AN ENDOSCOPIC PROCEDURE TODAY AT [facility]:   Refer to the procedure report that was given to you for any specific questions about what was found during the examination.  If the procedure report does not answer your questions, please call your gastroenterologist to clarify.  If you requested that your care partner not be given the details of your procedure findings, then the procedure report has been included in a sealed envelope for you to review at your convenience later.  YOU SHOULD EXPECT: Some feelings of bloating in the abdomen. Passage of more gas than usual.  Walking can help get rid of the air that was put into your GI tract during the procedure and reduce the bloating. If you had a lower endoscopy (such as a colonoscopy or flexible sigmoidoscopy) you may notice spotting of blood in your stool or on the toilet paper. If you underwent a bowel prep for your procedure, you may not have a normal bowel movement for a few days.  Please Note:  You might notice some irritation and congestion in your nose or some drainage.  This is from the oxygen used during your procedure.  There is no need for concern and it should clear up in a day or so.  SYMPTOMS TO REPORT IMMEDIATELY:    Following upper endoscopy (EGD)  Vomiting of blood or coffee ground material  New chest pain or pain under the shoulder blades  Painful or persistently difficult swallowing  New shortness of breath  Fever of 100F or higher  Black, tarry-looking stools  For urgent or emergent issues, a gastroenterologist can be reached at any hour by calling [phone].  Please see handout on Dilation Diet and Gastritis   DIET:  Clear liguid diet til 11:00 then soft fluids at 12 noon for the rest of the day. Tomorrow  you may proceed to your regular diet.  Drink plenty of fluids but you should avoid alcoholic beverages for 24 hours.  ACTIVITY:  You should plan to take it easy for the rest of today and  you should NOT DRIVE or use heavy machinery until tomorrow (because of the sedation medicines used during the test).    FOLLOW UP: Our staff will call the number listed on your records 48-72 hours following your procedure to check on you and address any questions or concerns that you may have regarding the information given to you following your procedure. If we do not reach you, we will leave a message.  We will attempt to reach you two times.  During this call, we will ask if you have developed any symptoms of COVID 19. If you develop any symptoms (ie: fever, flu-like symptoms, shortness of breath, cough etc.) before then, please call [phone].  If you test positive for Covid 19 in the 2 weeks post procedure, please call and report this information to [place].    If any biopsies were taken you will be contacted by phone or by letter within the next 1-3 weeks.  Please call us at [phone] if you have not heard about the biopsies in 3 weeks.    SIGNATURES/CONFIDENTIALITY: You and/or your care partner have signed paperwork which will be entered into your electronic medical record.  These signatures attest to the fact that that the information above on your After Visit Summary has been reviewed and is understood.  Full responsibility of the confidentiality of this discharge information lies with you and/or your care-partner.  Thank you for  letting us take care of your healthcare needs today.

## 2018-09-01 NOTE — Op Note (Signed)
[name] Procedure Date: 09/01/2018 9:50 AM MRN: [id number] Endoscopist: Ladene Artist , MD Age: 68 Referring MD:  Date of Birth: 1950-06-01 Gender: Male Account #: [id number] Procedure:                Upper GI endoscopy Indications:              Dysphagia, Gastroesophageal reflux disease Medicines:                Monitored Anesthesia Care Procedure:                Pre-Anesthesia Assessment:                           - Prior to the procedure, a History and Physical                            was performed, and patient medications and                            allergies were reviewed. The patient's tolerance of                            previous anesthesia was also reviewed. The risks                            and benefits of the procedure and the sedation                            options and risks were discussed with the patient.                            All questions were answered, and informed consent                            was obtained. Prior Anticoagulants: The patient has                            taken no previous anticoagulant or antiplatelet                            agents. ASA Grade Assessment: II - A patient with                            mild systemic disease. After reviewing the risks                            and benefits, the patient was deemed in                            satisfactory condition to undergo the procedure.                           After obtaining informed consent, the endoscope was  passed under direct vision. Throughout the                            procedure, the patient's blood pressure, pulse, and                            oxygen saturations were monitored continuously. The                            Endoscope was introduced through the mouth, and                            advanced to the second part of duodenum. The upper                            GI endoscopy  was accomplished without difficulty.                            The patient tolerated the procedure well. Scope In: Scope Out: Findings:                 Patchy, white plaques were found in the proximal                            esophagus. Biopsies were taken with a cold forceps                            for histology.                           No endoscopic abnormality was evident in the                            esophagus to explain the patient's complaint of                            dysphagia. It was decided, however, to proceed with                            dilation of the entire esophagus. A guidewire was                            placed and the scope was withdrawn. Dilation was                            performed with a Savary dilator with no resistance                            at 17 mm.                           The exam of the esophagus was otherwise normal.  Diffuse moderate inflammation characterized by                            congestion (edema), erythema, friability and                            granularity was found in the gastric fundus, in the                            gastric body and in the gastric antrum. Biopsies                            were taken with a cold forceps for histology.                           The exam of the stomach was otherwise normal.                           An acquired benign-appearing, intrinsic moderate                            stenosis was found in the duodenal bulb and was                            traversed.                           The exam of the duodenum was otherwise normal. Complications:            No immediate complications. Estimated Blood Loss:     Estimated blood loss was minimal. Impression:               - Esophageal plaques were found, suspicious for                            candidiasis. Biopsied.                           - No endoscopic esophageal abnormality to explain                             patient's dysphagia. Esophagus dilated. Dilated.                           - Gastritis. Biopsied.                           - Acquired duodenal stenosis. Recommendation:           - Patient has a contact number available for                            emergencies. The signs and symptoms of potential                            delayed complications were discussed with the  patient. Return to normal activities tomorrow.                            Written discharge instructions were provided to the                            patient.                           - Clear liquid diet for 2 hours, then advance as                            tolerated to soft diet today.                           - Resume prior diet tomorrow.                           - Continue present medications.                           - Diflucan 100 mg po qd, #7, no refils.                           - Await pathology results.                           - Return to GI office in 6 weeks. Ladene Artist, MD 09/01/2018 10:06:10 AM This report has been signed electronically.

## 2018-09-02 ENCOUNTER — Other Ambulatory Visit: Payer: Self-pay | Admitting: Gastroenterology

## 2018-09-02 MED ORDER — FLUCONAZOLE 100 MG PO TABS: 100.0000 mg | ORAL_TABLET | Freq: Every day | ORAL | 0 refills | Status: DC
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
## 2018-09-02 NOTE — Telephone Encounter (Signed)
Pt had colonoscopy yesterday and on discharge paper it stated that pt will be prescribed diflucan--pt stated that pharmacy has not received prescription.  [facility]

## 2018-09-03 ENCOUNTER — Other Ambulatory Visit: Payer: Self-pay | Admitting: Internal Medicine

## 2018-09-03 ENCOUNTER — Other Ambulatory Visit: Payer: Self-pay | Admitting: Neurology

## 2018-09-03 ENCOUNTER — Telehealth: Payer: Self-pay

## 2018-09-03 NOTE — Telephone Encounter (Signed)
  Follow up Call-  Call back number 09/01/2018  Post procedure Call Back phone  # [phone]  Permission to leave phone message Yes  Some recent data might be hidden     Patient questions:  Do you have a fever, pain , or abdominal swelling? No. Pain Score  0 *  Have you tolerated food without any problems? Yes.    Have you been able to return to your normal activities? Yes.    Do you have any questions about your discharge instructions: Diet   No. Medications  No. Follow up visit  No.  Do you have questions or concerns about your Care? No.  Actions: * If pain score is 4 or above: No action needed, pain <4.  1. Have you developed a fever since your procedure? no  2.   Have you had an respiratory symptoms (SOB or cough) since your procedure? no  3.   Have you tested positive for COVID 19 since your procedure no  4.   Have you had any family members/close contacts diagnosed with the COVID 19 since your procedure?  no   If yes to any of these questions please route to Joylene John, RN and Alphonsa Gin, Therapist, sports.

## 2018-09-06 ENCOUNTER — Other Ambulatory Visit: Payer: Self-pay

## 2018-09-06 MED ORDER — PANTOPRAZOLE SODIUM 40 MG PO TBEC: 40.0000 mg | DELAYED_RELEASE_TABLET | Freq: Two times a day (BID) | ORAL | 0 refills | Status: DC

## 2018-09-06 MED ORDER — PYLERA 140-125-125 MG PO CAPS: 3.0000 | ORAL_CAPSULE | Freq: Three times a day (TID) | ORAL | 0 refills | Status: DC

## 2018-09-25 ENCOUNTER — Other Ambulatory Visit: Payer: Self-pay | Admitting: Neurology

## 2018-09-27 ENCOUNTER — Other Ambulatory Visit: Payer: Self-pay | Admitting: Neurology

## 2018-10-01 ENCOUNTER — Encounter: Payer: Self-pay | Admitting: Neurology

## 2018-10-01 ENCOUNTER — Telehealth (INDEPENDENT_AMBULATORY_CARE_PROVIDER_SITE_OTHER): Payer: Medicare Other | Admitting: Neurology

## 2018-10-01 ENCOUNTER — Other Ambulatory Visit: Payer: Self-pay

## 2018-10-01 VITALS — Ht 70.0 in | Wt 210.0 lb

## 2018-10-01 DIAGNOSIS — I639 Cerebral infarction, unspecified: Secondary | ICD-10-CM

## 2018-10-01 DIAGNOSIS — G8111 Spastic hemiplegia affecting right dominant side: Secondary | ICD-10-CM | POA: Diagnosis not present

## 2018-10-01 NOTE — Progress Notes (Signed)
Follow up scheduled

## 2018-10-01 NOTE — Progress Notes (Signed)
    Virtual Visit via Telephone Note The purpose of this virtual visit is to provide medical care while limiting exposure to the novel coronavirus.    Consent was obtained for phone visit:  Yes.   Answered questions that patient had about telehealth interaction:  Yes.   I discussed the limitations, risks, security and privacy concerns of performing an evaluation and management service by telephone. I also discussed with the patient that there may be a patient responsible charge related to this service. The patient expressed understanding and agreed to proceed.  Pt location: Home Physician Location: office Name of referring provider:  Plotnikov, Evie Lacks, MD I connected with [name] at patients initiation/request on 10/01/2018 at 10:50 AM EDT by telephone and verified that I am speaking with the correct person using two identifiers.  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  He completed physical therapy which has helped his leg spasticity. Overall, he is feeling significantly better as compared to the last time he was in the office.  He is taking baclofen 10mg  twice daily and takes an extra dose for low back pain as needed.  He has been able to get back to playing golf.  He is still unable to jog, and would like to be able to do this again in the future.  He is swimming 5-days per weeks.   Assessment and Plan:   1. 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  He is pleased that he can return to playing golf, however would like to get back to jogging.  Prior to his stroke he was running 5K and 10K.  -Continue baclofen 10 mg twice daily, okay to take  extra dose as needed  -Continue home stretching exercises.  I praised him for staying active regularly  2.  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  Given two vascular territories, cardioembolic source is suspected however his cardiac monitoring was normal.  If he has future events, he may need implantable loop recorder.  Continue secondary prevention with aspirin 325mg  daily and lipitor 20mg  (LDL 33).  Blood pressure and diabetes is well-controlled.   Follow Up Instructions:   I discussed the assessment and treatment plan with the patient. The patient was provided an opportunity to ask questions and all were answered. The patient agreed with the plan and demonstrated an understanding of the instructions.   The patient was advised to call back or seek an in-person evaluation if the symptoms worsen or if the condition fails to improve as anticipated.  Return to clinic in 9 months  Total Time spent in visit with the patient was:  8 min, of which 100% of the time was spent in counseling and/or coordinating care.   Pt understands and agrees with the plan of care outlined.     Alda Berthold, DO

## 2018-10-19 ENCOUNTER — Telehealth: Payer: Self-pay | Admitting: Internal Medicine

## 2018-10-19 MED ORDER — TRIAMCINOLONE ACETONIDE 0.1 % EX CREA: 1.0000 "application " | TOPICAL_CREAM | Freq: Two times a day (BID) | CUTANEOUS | 3 refills | Status: DC

## 2018-10-19 NOTE — Telephone Encounter (Signed)
Medication: triamcinolone cream (KENALOG) 0.1 % [[id number]]   Pharmacy:  [id number] [place], [address] [place] [phone] (Phone) [phone] (Fax)

## 2018-10-19 NOTE — Telephone Encounter (Signed)
MD approved and sent to pof../lmb 

## 2018-10-19 NOTE — Telephone Encounter (Signed)
Last refill was back in 2018 pls advise if ok to refill.Marland KitchenJohny Chess

## 2018-10-19 NOTE — Telephone Encounter (Signed)
Done. Thx.

## 2018-10-24 ENCOUNTER — Other Ambulatory Visit: Payer: Self-pay | Admitting: Neurology

## 2018-11-05 ENCOUNTER — Other Ambulatory Visit: Payer: Self-pay | Admitting: Internal Medicine

## 2018-12-06 ENCOUNTER — Other Ambulatory Visit: Payer: Self-pay

## 2018-12-08 ENCOUNTER — Ambulatory Visit: Payer: Medicare Other | Admitting: Endocrinology

## 2018-12-08 ENCOUNTER — Encounter: Payer: Self-pay | Admitting: Endocrinology

## 2018-12-08 ENCOUNTER — Other Ambulatory Visit: Payer: Self-pay

## 2018-12-08 VITALS — BP 148/88 | HR 57 | Ht 70.0 in | Wt 215.6 lb

## 2018-12-08 DIAGNOSIS — E1159 Type 2 diabetes mellitus with other circulatory complications: Secondary | ICD-10-CM | POA: Diagnosis not present

## 2018-12-08 LAB — POCT GLYCOSYLATED HEMOGLOBIN (HGB A1C): Hemoglobin A1C: 5.9 % — AB (ref 4.0–5.6)

## 2018-12-08 NOTE — Patient Instructions (Addendum)
Your blood pressure is high today.  Please see your primary care provider soon, to have it rechecked check your blood sugar once a day.  vary the time of day when you check, between before the 3 meals, and at bedtime.  also check if you have symptoms of your blood sugar being too high or too low.  please keep a record of the readings and bring it to your next appointment here (or you can bring the meter itself).  You can write it on any piece of paper.  please call us sooner if your blood sugar goes below 70, or if you have a lot of readings over 200.   Please continue the same diabetes medications.   Please come back for a follow-up appointment in 6 months.      

## 2018-12-08 NOTE — Progress Notes (Signed)
Subjective:    Patient ID: Christian Clements, male    DOB: [birth year], 68 y.o.   MRN: [id number]  HPI Pt returns for f/u of diabetes mellitus: DM type: 2 Dx'ed: 0037 Complications: CVA and PN Therapy: 2 oral meds.  DKA: never Severe hypoglycemia: never.  Pancreatitis: never.  Other: he took insulin for 1 month, in 2017.   Interval history: he says cbg's are well-controlled.  pt states he feels well in general.   He takes meds as rx'ed Past Medical History:  Diagnosis Date  . Diabetes mellitus   . Hypertension   . Prostatitis 2011  . Stroke [facility])     Past Surgical History:  Procedure Laterality Date  . COLONOSCOPY    . None      Social History   Socioeconomic History  . Marital status: Married    Spouse name: Not on file  . Number of children: 0  . Years of education: 16  . Highest education level: Bachelor's degree (e.g., BA, AB, BS)  Occupational History  . Occupation: PE Product manager: [employer]  Social Needs  . Financial resource strain: Not on file  . Food insecurity    Worry: Not on file    Inability: Not on file  . Transportation needs    Medical: Not on file    Non-medical: Not on file  Tobacco Use  . Smoking status: Former Research scientist (life sciences)  . Smokeless tobacco: Never Used  Substance and Sexual Activity  . Alcohol use: Yes    Alcohol/week: 3.0 standard drinks    Types: 3 Glasses of wine per week    Comment: Socially  . Drug use: No  . Sexual activity: Yes  Lifestyle  . Physical activity    Days per week: Not on file    Minutes per session: Not on file  . Stress: Not on file  Relationships  . Social Herbalist on phone: Not on file    Gets together: Not on file    Attends religious service: Not on file    Active member of club or organization: Not on file    Attends meetings of clubs or organizations: Not on file    Relationship status: Not on file  . Intimate partner violence    Fear of current or ex partner: Not on  file    Emotionally abused: Not on file    Physically abused: Not on file    Forced sexual activity: Not on file  Other Topics Concern  . Not on file  Social History Narrative   Regular exercise- yes.  Lives with wife in a 2 story home.  Has no children.     Retired Careers information officer.     Education: college.        Current Outpatient Medications on File Prior to Visit  Medication Sig Dispense Refill  . amLODipine-valsartan (EXFORGE) 5-320 MG tablet TAKE 1 TABLET BY MOUTH EVERY DAY 90 tablet 3  . aspirin 325 MG tablet Take 1 tablet (325 mg total) by mouth daily. 100 tablet 3  . atorvastatin (LIPITOR) 20 MG tablet TAKE 1 TABLET BY MOUTH EVERY DAY 90 tablet 1  . b complex vitamins tablet Take 1 tablet by mouth daily. 100 tablet 3  . baclofen (LIORESAL) 10 MG tablet TAKE 1 TABLET BY MOUTH TWICE A DAY 180 tablet 1  . bismuth-metronidazole-tetracycline (PYLERA) 140-125-125 MG capsule Take 3 capsules by mouth 4 (four) times daily -  before meals  and at bedtime. 168 capsule 0  . blood glucose meter kit and supplies KIT Dispense based on patient and insurance preference. Use two times daily as directed. (FOR ICD-9 250.00, 250.01). 1 each 0  . Cholecalciferol 1000 UNITS tablet Take 1,000 Units by mouth daily.      Marland Kitchen doxazosin (CARDURA) 2 MG tablet TAKE 1 TABLET BY MOUTH EVERY DAY 90 tablet 3  . fluconazole (DIFLUCAN) 100 MG tablet Take 1 tablet (100 mg total) by mouth daily. 7 tablet 0  . gabapentin (NEURONTIN) 100 MG capsule Take 2 capsules (200 mg total) by mouth at bedtime. 60 capsule 3  . glucose blood (ONETOUCH VERIO) test strip Use to check blood sugars twice a day Dx E11.9 Yearly physical w/labs are due must see MD for refills 100 each 0  . metFORMIN (GLUCOPHAGE) 1000 MG tablet TAKE 1 TABLET (1,000 MG TOTAL) BY MOUTH 2 (TWO) TIMES DAILY WITH A MEAL. 180 tablet 3  . pantoprazole (PROTONIX) 40 MG tablet TAKE 1 TABLET BY MOUTH EVERY DAY 90 tablet 1  . Psyllium (METAMUCIL PO) Take by mouth 2 (two)  times a day.    . sildenafil (VIAGRA) 100 MG tablet Take 1 tablet (100 mg total) by mouth daily as needed. 10 tablet 11  . TRADJENTA 5 MG TABS tablet TAKE 1 TABLET BY MOUTH EVERY DAY 30 tablet 11  . triamcinolone cream (KENALOG) 0.1 % Apply 1 application topically 2 (two) times daily. 80 g 3   Current Facility-Administered Medications on File Prior to Visit  Medication Dose Route Frequency Provider Last Rate Last Dose  . 0.9 %  sodium chloride infusion  500 mL Intravenous Once Ladene Artist, MD        Allergies  Allergen Reactions  . Accuretic [Quinapril-Hydrochlorothiazide]     Drug rash ?    Family History  Problem Relation Age of Onset  . Cancer Mother 70       leukemia  . Hypertension Other   . Diabetes Maternal Grandfather   . Stroke Sister   . Cancer Brother   . HIV/AIDS Brother   . Colon cancer Neg Hx   . Esophageal cancer Neg Hx   . Liver cancer Neg Hx   . Rectal cancer Neg Hx   . Stomach cancer Neg Hx   . Colon polyps Neg Hx     BP (!) 148/88 (BP Location: Left Arm, Patient Position: Sitting, Cuff Size: Large)   Pulse (!) 57   Ht _0  (1.778 m)   Wt 215 lb 9.6 oz (97.8 kg)   SpO2 97%   BMI 30.94 kg/m    Review of Systems He denies hypoglycemia.     Objective:   Physical Exam VITAL SIGNS:  See vs page GENERAL: no distress Pulses: dorsalis pedis intact bilat.   MSK: no deformity of the feet CV: trace bilat leg edema Skin:  no ulcer on the feet.  normal color and temp on the feet. Neuro: sensation is intact to touch on the feet Ext: there is bilateral onychomycosis of the toenails  Lab Results  Component Value Date   TSH 2.08 06/10/2017   Lab Results  Component Value Date   CREATININE 0.89 06/07/2018   BUN 12 06/07/2018   NA 137 06/07/2018   K 3.8 06/07/2018   CL 103 06/07/2018   CO2 24 06/07/2018    Lab Results  Component Value Date   HGBA1C 5.9 (A) 12/08/2018      Assessment & Plan:  Insulin-requiring  type 2 DM, with CVA:  well-controlled Edema: This limits rx options HTN: is noted today   Patient Instructions  Your blood pressure is high today.  Please see your primary care provider soon, to have it rechecked check your blood sugar once a day.  vary the time of day when you check, between before the 3 meals, and at bedtime.  also check if you have symptoms of your blood sugar being too high or too low.  please keep a record of the readings and bring it to your next appointment here (or you can bring the meter itself).  You can write it on any piece of paper.  please call us sooner if your blood sugar goes below 70, or if you have a lot of readings over 200. Please continue the same diabetes medications.  Please come back for a follow-up appointment in 6 months.

## 2019-02-11 ENCOUNTER — Other Ambulatory Visit (INDEPENDENT_AMBULATORY_CARE_PROVIDER_SITE_OTHER): Payer: Medicare Other

## 2019-02-11 DIAGNOSIS — E785 Hyperlipidemia, unspecified: Secondary | ICD-10-CM | POA: Diagnosis not present

## 2019-02-11 DIAGNOSIS — N401 Enlarged prostate with lower urinary tract symptoms: Secondary | ICD-10-CM | POA: Diagnosis not present

## 2019-02-11 DIAGNOSIS — E1159 Type 2 diabetes mellitus with other circulatory complications: Secondary | ICD-10-CM | POA: Diagnosis not present

## 2019-02-11 DIAGNOSIS — R350 Frequency of micturition: Secondary | ICD-10-CM | POA: Diagnosis not present

## 2019-02-11 DIAGNOSIS — E1169 Type 2 diabetes mellitus with other specified complication: Secondary | ICD-10-CM | POA: Diagnosis not present

## 2019-02-11 DIAGNOSIS — R1310 Dysphagia, unspecified: Secondary | ICD-10-CM | POA: Diagnosis not present

## 2019-02-11 DIAGNOSIS — R1319 Other dysphagia: Secondary | ICD-10-CM

## 2019-02-11 LAB — TSH: TSH: 2.76 u[IU]/mL (ref 0.35–4.50)

## 2019-02-11 LAB — CBC WITH DIFFERENTIAL/PLATELET
Basophils Absolute: 0 10*3/uL (ref 0.0–0.1)
Basophils Relative: 0.5 % (ref 0.0–3.0)
Eosinophils Absolute: 0.2 10*3/uL (ref 0.0–0.7)
Eosinophils Relative: 2.7 % (ref 0.0–5.0)
HCT: 45.4 % (ref 39.0–52.0)
Hemoglobin: 15.7 g/dL (ref 13.0–17.0)
Lymphocytes Relative: 30 % (ref 12.0–46.0)
Lymphs Abs: 2.4 10*3/uL (ref 0.7–4.0)
MCHC: 34.6 g/dL (ref 30.0–36.0)
MCV: 95 fl (ref 78.0–100.0)
Monocytes Absolute: 0.5 10*3/uL (ref 0.1–1.0)
Monocytes Relative: 5.6 % (ref 3.0–12.0)
Neutro Abs: 5 10*3/uL (ref 1.4–7.7)
Neutrophils Relative %: 61.2 % (ref 43.0–77.0)
Platelets: 269 10*3/uL (ref 150.0–400.0)
RBC: 4.78 Mil/uL (ref 4.22–5.81)
RDW: 13.5 % (ref 11.5–15.5)
WBC: 8.1 10*3/uL (ref 4.0–10.5)

## 2019-02-11 LAB — BASIC METABOLIC PANEL
BUN: 13 mg/dL (ref 6–23)
CO2: 24 mEq/L (ref 19–32)
Calcium: 10 mg/dL (ref 8.4–10.5)
Chloride: 105 mEq/L (ref 96–112)
Creatinine, Ser: 1 mg/dL (ref 0.40–1.50)
GFR: 89.67 mL/min (ref >60.00)
Glucose, Bld: 118 mg/dL — ABNORMAL HIGH (ref 70–99)
Potassium: 4.8 mEq/L (ref 3.5–5.1)
Sodium: 140 mEq/L (ref 135–145)

## 2019-02-11 LAB — HEPATIC FUNCTION PANEL
ALT: 23 U/L (ref 0–53)
AST: 18 U/L (ref 0–37)
Albumin: 5 g/dL (ref 3.5–5.2)
Alkaline Phosphatase: 72 U/L (ref 39–117)
Bilirubin, Direct: 0.2 mg/dL (ref 0.0–0.3)
Total Bilirubin: 1.1 mg/dL (ref 0.2–1.2)
Total Protein: 7.6 g/dL (ref 6.0–8.3)

## 2019-02-11 LAB — URINALYSIS, ROUTINE W REFLEX MICROSCOPIC
Bilirubin Urine: NEGATIVE
Hgb urine dipstick: NEGATIVE
Nitrite: NEGATIVE
Specific Gravity, Urine: 1.015 (ref 1.000–1.030)
Total Protein, Urine: NEGATIVE
Urine Glucose: NEGATIVE
Urobilinogen, UA: 0.2 (ref 0.0–1.0)
pH: 6 (ref 5.0–8.0)

## 2019-02-11 LAB — LIPID PANEL
Cholesterol: 95 mg/dL (ref 0–200)
HDL: 41.2 mg/dL (ref >39.00)
LDL Cholesterol: 31 mg/dL (ref 0–99)
NonHDL: 53.31
Total CHOL/HDL Ratio: 2
Triglycerides: 112 mg/dL (ref 0.0–149.0)
VLDL: 22.4 mg/dL (ref 0.0–40.0)

## 2019-02-11 LAB — PSA: PSA: 2.18 ng/mL (ref 0.10–4.00)

## 2019-02-11 LAB — HEMOGLOBIN A1C: Hgb A1c MFr Bld: 6.4 % (ref 4.6–6.5)

## 2019-02-11 LAB — MICROALBUMIN / CREATININE URINE RATIO
Creatinine,U: 133.8 mg/dL
Microalb Creat Ratio: 3 mg/g (ref 0.0–30.0)
Microalb, Ur: 4 mg/dL — ABNORMAL HIGH (ref 0.0–1.9)

## 2019-02-14 ENCOUNTER — Ambulatory Visit (INDEPENDENT_AMBULATORY_CARE_PROVIDER_SITE_OTHER): Payer: Medicare Other | Admitting: Internal Medicine

## 2019-02-14 ENCOUNTER — Encounter: Payer: Self-pay | Admitting: Internal Medicine

## 2019-02-14 ENCOUNTER — Other Ambulatory Visit: Payer: Self-pay

## 2019-02-14 DIAGNOSIS — G8191 Hemiplegia, unspecified affecting right dominant side: Secondary | ICD-10-CM | POA: Diagnosis not present

## 2019-02-14 DIAGNOSIS — I10 Essential (primary) hypertension: Secondary | ICD-10-CM

## 2019-02-14 DIAGNOSIS — N410 Acute prostatitis: Secondary | ICD-10-CM

## 2019-02-14 DIAGNOSIS — E1159 Type 2 diabetes mellitus with other circulatory complications: Secondary | ICD-10-CM

## 2019-02-14 DIAGNOSIS — E785 Hyperlipidemia, unspecified: Secondary | ICD-10-CM

## 2019-02-14 DIAGNOSIS — G3184 Mild cognitive impairment, so stated: Secondary | ICD-10-CM

## 2019-02-14 DIAGNOSIS — N419 Inflammatory disease of prostate, unspecified: Secondary | ICD-10-CM | POA: Insufficient documentation

## 2019-02-14 DIAGNOSIS — E1169 Type 2 diabetes mellitus with other specified complication: Secondary | ICD-10-CM | POA: Diagnosis not present

## 2019-02-14 MED ORDER — CIPROFLOXACIN HCL 500 MG PO TABS: 500.0000 mg | ORAL_TABLET | Freq: Two times a day (BID) | ORAL | 0 refills | Status: DC

## 2019-02-14 NOTE — Assessment & Plan Note (Signed)
Metformin, Trajenta 

## 2019-02-14 NOTE — Assessment & Plan Note (Signed)
B complex

## 2019-02-14 NOTE — Assessment & Plan Note (Signed)
[place] 

## 2019-02-14 NOTE — Progress Notes (Signed)
Subjective:  Patient ID: Christian Clements, male    DOB: [birth year]  Age: 68 y.o. MRN: [id number]  CC: No chief complaint on file.   HPI Christian Clements presents for HTN, CVA, DM C/o "prostate sx's" - painful x 1 week  Outpatient Medications Prior to Visit  Medication Sig Dispense Refill  . amLODipine-valsartan (EXFORGE) 5-320 MG tablet TAKE 1 TABLET BY MOUTH EVERY DAY 90 tablet 3  . aspirin 325 MG tablet Take 1 tablet (325 mg total) by mouth daily. 100 tablet 3  . atorvastatin (LIPITOR) 20 MG tablet TAKE 1 TABLET BY MOUTH EVERY DAY 90 tablet 1  . b complex vitamins tablet Take 1 tablet by mouth daily. 100 tablet 3  . baclofen (LIORESAL) 10 MG tablet TAKE 1 TABLET BY MOUTH TWICE A DAY 180 tablet 1  . bismuth-metronidazole-tetracycline (PYLERA) 140-125-125 MG capsule Take 3 capsules by mouth 4 (four) times daily -  before meals and at bedtime. 168 capsule 0  . blood glucose meter kit and supplies KIT Dispense based on patient and insurance preference. Use two times daily as directed. (FOR ICD-9 250.00, 250.01). 1 each 0  . Cholecalciferol 1000 UNITS tablet Take 1,000 Units by mouth daily.      Marland Kitchen doxazosin (CARDURA) 2 MG tablet TAKE 1 TABLET BY MOUTH EVERY DAY 90 tablet 3  . fluconazole (DIFLUCAN) 100 MG tablet Take 1 tablet (100 mg total) by mouth daily. 7 tablet 0  . gabapentin (NEURONTIN) 100 MG capsule Take 2 capsules (200 mg total) by mouth at bedtime. 60 capsule 3  . glucose blood (ONETOUCH VERIO) test strip Use to check blood sugars twice a day Dx E11.9 Yearly physical w/labs are due must see MD for refills 100 each 0  . metFORMIN (GLUCOPHAGE) 1000 MG tablet TAKE 1 TABLET (1,000 MG TOTAL) BY MOUTH 2 (TWO) TIMES DAILY WITH A MEAL. 180 tablet 3  . pantoprazole (PROTONIX) 40 MG tablet TAKE 1 TABLET BY MOUTH EVERY DAY 90 tablet 1  . Psyllium (METAMUCIL PO) Take by mouth 2 (two) times a day.    . sildenafil (VIAGRA) 100 MG tablet Take 1 tablet (100 mg total) by mouth daily as needed. 10  tablet 11  . TRADJENTA 5 MG TABS tablet TAKE 1 TABLET BY MOUTH EVERY DAY 30 tablet 11  . triamcinolone cream (KENALOG) 0.1 % Apply 1 application topically 2 (two) times daily. 80 g 3   Facility-Administered Medications Prior to Visit  Medication Dose Route Frequency Provider Last Rate Last Dose  . 0.9 %  sodium chloride infusion  500 mL Intravenous Once Ladene Artist, MD        ROS: Review of Systems  Constitutional: Negative for appetite change, fatigue and unexpected weight change.  HENT: Negative for congestion, nosebleeds, sneezing, sore throat and trouble swallowing.   Eyes: Negative for itching and visual disturbance.  Respiratory: Negative for cough.   Cardiovascular: Negative for chest pain, palpitations and leg swelling.  Gastrointestinal: Negative for abdominal distention, blood in stool, diarrhea and nausea.  Genitourinary: Positive for dysuria. Negative for decreased urine volume, frequency and hematuria.  Musculoskeletal: Negative for back pain, gait problem, joint swelling and neck pain.  Skin: Negative for rash.  Neurological: Negative for dizziness, tremors, speech difficulty and weakness.  Psychiatric/Behavioral: Positive for decreased concentration. Negative for agitation, dysphoric mood, sleep disturbance and suicidal ideas. The patient is not nervous/anxious.     Objective:  BP 130/84 (BP Location: Left Arm, Patient Position: Sitting, Cuff Size: Large)   Pulse  73   Temp 98 F (36.7 C) (Oral)   Ht '5\' 10"'$  (1.778 m)   Wt 219 lb (99.3 kg)   SpO2 97%   BMI 31.42 kg/m   BP Readings from Last 3 Encounters:  02/14/19 130/84  12/08/18 (!) 148/88  09/01/18 (!) 149/79    Wt Readings from Last 3 Encounters:  02/14/19 219 lb (99.3 kg)  12/08/18 215 lb 9.6 oz (97.8 kg)  10/01/18 210 lb (95.3 kg)    Physical Exam Constitutional:      General: He is not in acute distress.    Appearance: He is well-developed.     Comments: NAD  Eyes:     Conjunctiva/sclera:  Conjunctivae normal.     Pupils: Pupils are equal, round, and reactive to light.  Neck:     Musculoskeletal: Normal range of motion.     Thyroid: No thyromegaly.     Vascular: No JVD.  Cardiovascular:     Rate and Rhythm: Normal rate and regular rhythm.     Heart sounds: Normal heart sounds. No murmur. No friction rub. No gallop.   Pulmonary:     Effort: Pulmonary effort is normal. No respiratory distress.     Breath sounds: Normal breath sounds. No wheezing or rales.  Chest:     Chest wall: No tenderness.  Abdominal:     General: Bowel sounds are normal. There is no distension.     Palpations: Abdomen is soft. There is no mass.     Tenderness: There is no abdominal tenderness. There is no guarding or rebound.  Musculoskeletal: Normal range of motion.        General: No tenderness.  Lymphadenopathy:     Cervical: No cervical adenopathy.  Skin:    General: Skin is warm and dry.     Findings: No rash.  Neurological:     Mental Status: He is alert and oriented to person, place, and time.     Cranial Nerves: No cranial nerve deficit.     Motor: No abnormal muscle tone.     Coordination: Coordination normal.     Gait: Gait normal.     Deep Tendon Reflexes: Reflexes are normal and symmetric.  Psychiatric:        Behavior: Behavior normal.        Thought Content: Thought content normal.        Judgment: Judgment normal.     Lab Results  Component Value Date   WBC 8.1 02/11/2019   HGB 15.7 02/11/2019   HCT 45.4 02/11/2019   PLT 269.0 02/11/2019   GLUCOSE 118 (H) 02/11/2019   CHOL 95 02/11/2019   TRIG 112.0 02/11/2019   HDL 41.20 02/11/2019   LDLDIRECT 30.1 11/14/2010   LDLCALC 31 02/11/2019   ALT 23 02/11/2019   AST 18 02/11/2019   NA 140 02/11/2019   K 4.8 02/11/2019   CL 105 02/11/2019   CREATININE 1.00 02/11/2019   BUN 13 02/11/2019   CO2 24 02/11/2019   TSH 2.76 02/11/2019   PSA 2.18 02/11/2019   INR 1.2 (H) 05/15/2016   HGBA1C 6.4 02/11/2019   MICROALBUR  4.0 (H) 02/11/2019    Dg Hip Unilat With Pelvis 2-3 Views Right  Result Date: 11/27/2016 CLINICAL DATA:  Right hip pain EXAM: DG HIP (WITH OR WITHOUT PELVIS) 2-3V RIGHT COMPARISON:  10/27/2016 FINDINGS: There is no evidence of hip fracture or dislocation. There is no evidence of arthropathy or other focal bone abnormality. IMPRESSION: Negative. Electronically Signed  By: Franchot Gallo M.D.   On: 11/27/2016 15:58    Assessment & Plan:   There are no diagnoses linked to this encounter.   No orders of the defined types were placed in this encounter.    Follow-up: No follow-ups on file.  Walker Kehr, MD

## 2019-02-14 NOTE — Assessment & Plan Note (Signed)
No change 

## 2019-02-14 NOTE — Assessment & Plan Note (Signed)
Cipro

## 2019-02-14 NOTE — Assessment & Plan Note (Signed)
On Lipitor 

## 2019-03-04 ENCOUNTER — Other Ambulatory Visit: Payer: Self-pay | Admitting: Internal Medicine

## 2019-04-03 ENCOUNTER — Other Ambulatory Visit: Payer: Self-pay | Admitting: Internal Medicine

## 2019-04-04 DIAGNOSIS — Z20828 Contact with and (suspected) exposure to other viral communicable diseases: Secondary | ICD-10-CM | POA: Diagnosis not present

## 2019-04-17 ENCOUNTER — Ambulatory Visit: Payer: Medicare PPO | Attending: Internal Medicine

## 2019-04-17 DIAGNOSIS — Z23 Encounter for immunization: Secondary | ICD-10-CM

## 2019-04-17 NOTE — Progress Notes (Signed)
   Covid-19 Vaccination Clinic  Name:  Christian Clements    MRN: [id number] DOB: July 03, 1950  04/17/2019  Christian Clements was observed post Covid-19 immunization for 15 minutes without incidence. He was provided with Vaccine Information Sheet and instruction to access the V-Safe system.   Christian Clements was instructed to call 911 with any severe reactions post vaccine: Marland Kitchen Difficulty breathing  . Swelling of your face and throat  . A fast heartbeat  . A bad rash all over your body  . Dizziness and weakness    Immunizations Administered    Name Date Dose VIS Date Route   Pfizer COVID-19 Vaccine 04/17/2019 12:10 PM 0.3 mL 03/04/2019 Intramuscular   Manufacturer: [place]   Lot: [id number]   [place]: [id number]

## 2019-04-19 ENCOUNTER — Other Ambulatory Visit: Payer: Self-pay | Admitting: Internal Medicine

## 2019-04-23 ENCOUNTER — Other Ambulatory Visit: Payer: Self-pay | Admitting: Neurology

## 2019-05-09 ENCOUNTER — Ambulatory Visit: Payer: Medicare PPO | Attending: Internal Medicine

## 2019-05-09 DIAGNOSIS — Z23 Encounter for immunization: Secondary | ICD-10-CM | POA: Insufficient documentation

## 2019-05-09 NOTE — Progress Notes (Signed)
   Covid-19 Vaccination Clinic  Name:  Christian Clements    MRN: [id number] DOB: 07/22/1950  05/09/2019  [name] was observed post Covid-19 immunization for 15 minutes without incidence. He was provided with Vaccine Information Sheet and instruction to access the V-Safe system.   [name] was instructed to call 911 with any severe reactions post vaccine: Marland Kitchen Difficulty breathing  . Swelling of your face and throat  . A fast heartbeat  . A bad rash all over your body  . Dizziness and weakness    Immunizations Administered    Name Date Dose VIS Date Route   Pfizer COVID-19 Vaccine 05/09/2019  2:06 PM 0.3 mL 03/04/2019 Intramuscular   Manufacturer: [place]   Lot: [id number]   [place]: [id number]

## 2019-05-26 ENCOUNTER — Other Ambulatory Visit: Payer: Self-pay | Admitting: Endocrinology

## 2019-06-07 ENCOUNTER — Other Ambulatory Visit: Payer: Self-pay

## 2019-06-09 ENCOUNTER — Ambulatory Visit: Payer: Medicare PPO | Admitting: Endocrinology

## 2019-06-09 ENCOUNTER — Other Ambulatory Visit: Payer: Self-pay

## 2019-06-09 VITALS — BP 118/64 | HR 66 | Ht 70.0 in | Wt 221.0 lb

## 2019-06-09 DIAGNOSIS — E1159 Type 2 diabetes mellitus with other circulatory complications: Secondary | ICD-10-CM | POA: Diagnosis not present

## 2019-06-09 LAB — POCT GLYCOSYLATED HEMOGLOBIN (HGB A1C): Hemoglobin A1C: 6.3 % — AB (ref 4.0–5.6)

## 2019-06-09 NOTE — Progress Notes (Signed)
Subjective:    Patient ID: Christian Clements, male    DOB: [birth year], 69 y.o.   MRN: [id number]  HPI Pt returns for f/u of diabetes mellitus:  DM type: 2 Dx'ed: 7939 Complications: CVA and PN Therapy: 2 oral meds.  DKA: never Severe hypoglycemia: never.  Pancreatitis: never.  Other: he took insulin for 1 month, in 2017.   Interval history: he says cbg's are well-controlled.  pt states he feels well in general.   He takes meds as rx'ed.   Past Medical History:  Diagnosis Date  . Diabetes mellitus   . Hypertension   . Prostatitis 2011  . Stroke [facility])     Past Surgical History:  Procedure Laterality Date  . COLONOSCOPY    . None      Social History   Socioeconomic History  . Marital status: Married    Spouse name: Not on file  . Number of children: 0  . Years of education: 16  . Highest education level: Bachelor's degree (e.g., BA, AB, BS)  Occupational History  . Occupation: PE Product manager: [employer]  Tobacco Use  . Smoking status: Former Research scientist (life sciences)  . Smokeless tobacco: Never Used  Substance and Sexual Activity  . Alcohol use: Yes    Alcohol/week: 3.0 standard drinks    Types: 3 Glasses of wine per week    Comment: Socially  . Drug use: No  . Sexual activity: Yes  Other Topics Concern  . Not on file  Social History Narrative   Regular exercise- yes.  Lives with wife in a 2 story home.  Has no children.     Retired Careers information officer.     Education: college.       Social Determinants of Health   Financial Resource Strain:   . Difficulty of Paying Living Expenses:   Food Insecurity:   . Worried About Charity fundraiser in the Last Year:   . Arboriculturist in the Last Year:   Transportation Needs:   . Film/video editor (Medical):   Marland Kitchen Lack of Transportation (Non-Medical):   Physical Activity:   . Days of Exercise per Week:   . Minutes of Exercise per Session:   Stress:   . Feeling of Stress :   Social Connections:   . Frequency of  Communication with Friends and Family:   . Frequency of Social Gatherings with Friends and Family:   . Attends Religious Services:   . Active Member of Clubs or Organizations:   . Attends Archivist Meetings:   Marland Kitchen Marital Status:   Intimate Partner Violence:   . Fear of Current or Ex-Partner:   . Emotionally Abused:   Marland Kitchen Physically Abused:   . Sexually Abused:     Current Outpatient Medications on File Prior to Visit  Medication Sig Dispense Refill  . amLODipine-valsartan (EXFORGE) 5-320 MG tablet TAKE 1 TABLET BY MOUTH EVERY DAY 90 tablet 3  . aspirin 325 MG tablet Take 1 tablet (325 mg total) by mouth daily. 100 tablet 3  . atorvastatin (LIPITOR) 20 MG tablet TAKE 1 TABLET BY MOUTH EVERY DAY 90 tablet 3  . b complex vitamins tablet Take 1 tablet by mouth daily. 100 tablet 3  . baclofen (LIORESAL) 10 MG tablet Take 1 tablet (10 mg total) by mouth 2 (two) times daily. 180 tablet 3  . bismuth-metronidazole-tetracycline (PYLERA) 140-125-125 MG capsule Take 3 capsules by mouth 4 (four) times daily -  before  meals and at bedtime. 168 capsule 0  . blood glucose meter kit and supplies KIT Dispense based on patient and insurance preference. Use two times daily as directed. (FOR ICD-9 250.00, 250.01). 1 each 0  . Cholecalciferol 1000 UNITS tablet Take 1,000 Units by mouth daily.      Marland Kitchen doxazosin (CARDURA) 2 MG tablet TAKE 1 TABLET BY MOUTH EVERY DAY 90 tablet 3  . fluconazole (DIFLUCAN) 100 MG tablet Take 1 tablet (100 mg total) by mouth daily. 7 tablet 0  . gabapentin (NEURONTIN) 100 MG capsule Take 2 capsules (200 mg total) by mouth at bedtime. 60 capsule 3  . glucose blood (ONETOUCH VERIO) test strip Use to check blood sugars twice a day Dx E11.9 Yearly physical w/labs are due must see MD for refills 100 each 0  . metFORMIN (GLUCOPHAGE) 1000 MG tablet TAKE 1 TABLET (1,000 MG TOTAL) BY MOUTH 2 (TWO) TIMES DAILY WITH A MEAL. 180 tablet 3  . pantoprazole (PROTONIX) 40 MG tablet TAKE 1  TABLET BY MOUTH EVERY DAY 90 tablet 1  . Psyllium (METAMUCIL PO) Take by mouth 2 (two) times a day.    . sildenafil (VIAGRA) 100 MG tablet Take 1 tablet (100 mg total) by mouth daily as needed. 10 tablet 11  . TRADJENTA 5 MG TABS tablet TAKE 1 TABLET BY MOUTH EVERY DAY 30 tablet 11  . triamcinolone cream (KENALOG) 0.1 % Apply 1 application topically 2 (two) times daily. 80 g 3   Current Facility-Administered Medications on File Prior to Visit  Medication Dose Route Frequency Provider Last Rate Last Admin  . 0.9 %  sodium chloride infusion  500 mL Intravenous Once Ladene Artist, MD        Allergies  Allergen Reactions  . Accuretic [Quinapril-Hydrochlorothiazide]     Drug rash ?    Family History  Problem Relation Age of Onset  . Cancer Mother 38       leukemia  . Hypertension Other   . Diabetes Maternal Grandfather   . Stroke Sister   . Cancer Brother   . HIV/AIDS Brother   . Colon cancer Neg Hx   . Esophageal cancer Neg Hx   . Liver cancer Neg Hx   . Rectal cancer Neg Hx   . Stomach cancer Neg Hx   . Colon polyps Neg Hx     BP 118/64   Pulse 66   Ht '5\' 10"'$  (1.778 m)   Wt 221 lb (100.2 kg)   SpO2 97%   BMI 31.71 kg/m   Review of Systems Denies nausea    Objective:   Physical Exam VITAL SIGNS:  See vs page GENERAL: no distress Pulses: dorsalis pedis intact bilat.   MSK: no deformity of the feet CV: trace bilat leg edema Skin:  no ulcer on the feet.  normal color and temp on the feet. Neuro: sensation is intact to touch on the feet Ext: there is bilateral onychomycosis of the toenails  Lab Results  Component Value Date   CREATININE 1.00 02/11/2019   BUN 13 02/11/2019   NA 140 02/11/2019   K 4.8 02/11/2019   CL 105 02/11/2019   CO2 24 02/11/2019   A1c=6.3%     Assessment & Plan:  Type 2 DM: well-controlled.  Edema: This limits rx options.   Patient Instructions  check your blood sugar once a day.  vary the time of day when you check, between  before the 3 meals, and at bedtime.  also check  if you have symptoms of your blood sugar being too high or too low.  please keep a record of the readings and bring it to your next appointment here (or you can bring the meter itself).  You can write it on any piece of paper.  please call us sooner if your blood sugar goes below 70, or if you have a lot of readings over 200. Please continue the same diabetes medications.  Please come back for a follow-up appointment in 6 months.

## 2019-06-09 NOTE — Patient Instructions (Addendum)
check your blood sugar once a day.  vary the time of day when you check, between before the 3 meals, and at bedtime.  also check if you have symptoms of your blood sugar being too high or too low.  please keep a record of the readings and bring it to your next appointment here (or you can bring the meter itself).  You can write it on any piece of paper.  please call us sooner if your blood sugar goes below 70, or if you have a lot of readings over 200. Please continue the same diabetes medications.  Please come back for a follow-up appointment in 6 months.   

## 2019-07-01 ENCOUNTER — Other Ambulatory Visit: Payer: Self-pay

## 2019-07-01 ENCOUNTER — Encounter: Payer: Self-pay | Admitting: Neurology

## 2019-07-01 ENCOUNTER — Ambulatory Visit: Payer: Medicare PPO | Admitting: Neurology

## 2019-07-01 VITALS — BP 139/80 | HR 67 | Resp 18 | Ht 70.0 in | Wt 217.0 lb

## 2019-07-01 DIAGNOSIS — G8111 Spastic hemiplegia affecting right dominant side: Secondary | ICD-10-CM

## 2019-07-01 DIAGNOSIS — R413 Other amnesia: Secondary | ICD-10-CM

## 2019-07-01 MED ORDER — BACLOFEN 10 MG PO TABS: 10.0000 mg | ORAL_TABLET | Freq: Three times a day (TID) | ORAL | 3 refills | Status: DC

## 2019-07-01 NOTE — Progress Notes (Signed)
Follow-up Visit   Date: 07/01/19    Christian Clements MRN: [id number] DOB: 1950/05/15   Interim History: Christian Clements is a 69 y.o. 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  The patient was accompanied to the clinic by self.   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  US carotids, MRA head, echo, and cardiac monitoring did not reveal abnormalities.  He was started on aspirin 380m as he was not taking any antiplatelet medication prior to presentation.  He completed physical therapy in January 2018.  Since then, he feels that his knee buckles and wife has noticed that when he walks, his right knee snaps back.  He was started on baclofen 114mBID which has helped.  He also complains of right low back pain, described as sharp and shooting for the past 6-7 weeks. It localized to his low back and does not radiate into his leg.  It is always exacerbated by activity, such as getting up.   He stays very active and goes to the gym 5-6 times per week as well as swims.    UPDATE 07/01/2019:  He is here for follow-up visit.  He remains complaint with baclofen 1044mwice daily and did not increase it to TID.  He wishes that his walking and walking was back to normal prior to his stroke.  He remains very active and goes to the gym regularly for leg stretching.  He had not had any interval hospitalizations or illnesses.    He continues to report problems with short-term memory, especially when reading.  He often has to reread things to retain information.He felt that he was always very sharp retaining information and now feels that is not as strong.    He does not have problems keeping up with conversations, ADLs, or IADL.   Medications:  Current Outpatient Medications on File Prior to Visit  Medication Sig Dispense Refill  . amLODipine-valsartan (EXFORGE) 5-320 MG tablet TAKE 1 TABLET BY MOUTH EVERY DAY 90 tablet 3  . aspirin 325 MG tablet Take 1 tablet (325 mg total) by mouth daily. 100 tablet 3  . atorvastatin (LIPITOR) 20 MG tablet TAKE 1 TABLET BY MOUTH EVERY DAY 90 tablet 3  . b complex vitamins tablet Take 1 tablet by mouth daily. 100 tablet 3  . baclofen (LIORESAL) 10 MG tablet Take 1 tablet (10 mg total) by mouth 2 (two) times daily. 180 tablet 3  . bismuth-metronidazole-tetracycline (PYLERA) 140-125-125 MG capsule Take 3 capsules by mouth 4 (four) times daily -  before meals and at bedtime. 168 capsule 0  . blood glucose meter kit and supplies KIT Dispense based on patient and insurance preference. Use two times daily as directed. (FOR ICD-9 250.00, 250.01). 1 each 0  . Cholecalciferol 1000 UNITS tablet Take 1,000 Units by mouth daily.      . dMarland Kitchenxazosin (CARDURA) 2 MG tablet TAKE 1 TABLET BY MOUTH EVERY DAY 90 tablet 3  . fluconazole (DIFLUCAN) 100 MG tablet Take 1 tablet (100 mg total) by mouth daily. 7 tablet 0  . gabapentin (NEURONTIN) 100 MG capsule Take 2 capsules (200 mg total) by mouth at bedtime. 60 capsule 3  . glucose blood (ONETOUCH VERIO) test  strip Use to check blood sugars twice a day Dx E11.9 Yearly physical w/labs are due must see MD for refills 100 each 0  . metFORMIN (GLUCOPHAGE) 1000 MG tablet TAKE 1 TABLET (1,000 MG TOTAL) BY MOUTH 2 (TWO) TIMES DAILY WITH A MEAL. 180 tablet 3  . pantoprazole (PROTONIX) 40 MG tablet TAKE 1 TABLET BY MOUTH EVERY DAY 90 tablet 1  . Psyllium (METAMUCIL PO) Take by mouth 2 (two) times a day.    . sildenafil (VIAGRA) 100 MG tablet Take 1 tablet (100 mg total) by mouth daily as needed. 10 tablet 11  . TRADJENTA 5 MG TABS tablet TAKE 1 TABLET BY MOUTH EVERY DAY 30 tablet 11  .  triamcinolone cream (KENALOG) 0.1 % Apply 1 application topically 2 (two) times daily. 80 g 3   Current Facility-Administered Medications on File Prior to Visit  Medication Dose Route Frequency Provider Last Rate Last Admin  . 0.9 %  sodium chloride infusion  500 mL Intravenous Once Ladene Artist, MD        Allergies:  Allergies  Allergen Reactions  . Accuretic [Quinapril-Hydrochlorothiazide]     Drug rash ?    Review of Systems:  CONSTITUTIONAL: No fevers, chills, night sweats, or weight loss.  EYES: No visual changes or eye pain ENT: No hearing changes.  No history of nose bleeds.   RESPIRATORY: No cough, wheezing and shortness of breath.   CARDIOVASCULAR: Negative for chest pain, and palpitations.   GI: Negative for abdominal discomfort, blood in stools or black stools.  No recent change in bowel habits.   GU:  No history of incontinence.   MUSCLOSKELETAL: No history of joint pain or swelling.  No myalgias.   SKIN: Negative for lesions, rash, and itching.   ENDOCRINE: Negative for cold or heat intolerance, polydipsia or goiter.   PSYCH:  No depression or anxiety symptoms.   NEURO: As Above.   Vital Signs:  BP 139/80   Pulse 67   Resp 18   Ht 5' 10"  (1.778 m)   Wt 217 lb (98.4 kg)   SpO2 96%   BMI 31.14 kg/m    General Medical Exam:   General:  Well appearing, comfortable  Eyes/ENT: see cranial nerve examination.   Neck: No masses appreciated.  Full range of motion without tenderness.  No carotid bruits. Respiratory:  Clear to auscultation, good air entry bilaterally.   Cardiac:  Regular rate and rhythm, no murmur.   Ext:  No edema  Neurological Exam: MENTAL STATUS including orientation to time, place, person, recent and remote memory, attention span and concentration, language, and fund of knowledge is normal.  Speech is not dysarthric.  MOTOR:  Motor strength is 5/5 in all extremities, except right intrinsic hand muscles are 4+/5.  No atrophy, fasciculations  or abnormal movements.  No pronator drift.  Tone is 0+ in the RLE.    MSRs:  Reflexes are 2+/4 throughout, except right patella is 3+/4.  COORDINATION/GAIT:  Gait shows mild spasticity, overall stable and unassisted  Data: MRI/A brain 01/04/2016: Acute sub cm infarction at the ventral pontomedullary junction just to the left of midline. Small acute left frontal subcortical white matter infarction. These 2 acute infarctions could be coincidental in due to ordinary small vessel disease, but emboli from the heart or ascending aorta could also result in this. Moderate chronic small-vessel ischemic changes of the cerebral hemispheric white matter for a age. Negative intracranial MR angiography of the large and medium size vessels.  Cardiac monitor 04/06/2016:  No arrythmia  Echo 01/06/2016:  Left ventricle: The cavity size was normal. Wall thickness was normal. Systolic function was normal. The estimated ejectionfraction was in the range of 55% to 60%.  No defect or patent foramen ovale was identified.  US carotids 01/05/2016:  1-39% bilateral ICA  Lab Results  Component Value Date   HGBA1C 6.3 (A) 06/09/2019   Lab Results  Component Value Date   LDLCALC 31 02/11/2019     IMPRESSION/PLAN: 1.  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  Cardioembolic source was suspected given the involvement of 2 vascular territories, however cardiac monitoring was normal.   If he has any new events, he will need implantable loop recorder  - Continue aspirin 325 mg and Lipitor 20 mg  - Blood pressure and diabetes is well controlled and followed by his PCP  3.  Cognitive changes with forgetfulness and difficulty  with memory retention.  He remains highly independent and active, able to perform all IADLs and ADLs.  I offered neurocognitive testing, which she will consider if symptoms get worse.  Return to clinic in 1 year  Thank you for allowing me to participate in patient's care.  If I can answer any additional questions, I would be pleased to do so.    Sincerely,    Neylan Koroma K. PPosey Pronto DO

## 2019-07-01 NOTE — Patient Instructions (Addendum)
Increase baclofen to 10mg  three times daily  Continue your exercises  If your memory seems to be getting worse, please call my office to schedule memory testing with our neuropsychologists  Return to clinic in 1 year

## 2019-08-12 ENCOUNTER — Other Ambulatory Visit (INDEPENDENT_AMBULATORY_CARE_PROVIDER_SITE_OTHER): Payer: Medicare PPO

## 2019-08-12 DIAGNOSIS — E1159 Type 2 diabetes mellitus with other circulatory complications: Secondary | ICD-10-CM

## 2019-08-12 DIAGNOSIS — M2392 Unspecified internal derangement of left knee: Secondary | ICD-10-CM | POA: Diagnosis not present

## 2019-08-12 DIAGNOSIS — M25562 Pain in left knee: Secondary | ICD-10-CM | POA: Diagnosis not present

## 2019-08-12 LAB — BASIC METABOLIC PANEL
BUN: 14 mg/dL (ref 6–23)
CO2: 26 mEq/L (ref 19–32)
Calcium: 10.1 mg/dL (ref 8.4–10.5)
Chloride: 105 mEq/L (ref 96–112)
Creatinine, Ser: 0.99 mg/dL (ref 0.40–1.50)
GFR: 90.58 mL/min (ref >60.00)
Glucose, Bld: 120 mg/dL — ABNORMAL HIGH (ref 70–99)
Potassium: 4.2 mEq/L (ref 3.5–5.1)
Sodium: 137 mEq/L (ref 135–145)

## 2019-08-12 LAB — HEMOGLOBIN A1C: Hgb A1c MFr Bld: 6.5 % (ref 4.6–6.5)

## 2019-08-15 ENCOUNTER — Encounter: Payer: Self-pay | Admitting: Internal Medicine

## 2019-08-15 ENCOUNTER — Other Ambulatory Visit: Payer: Self-pay

## 2019-08-15 ENCOUNTER — Ambulatory Visit (INDEPENDENT_AMBULATORY_CARE_PROVIDER_SITE_OTHER): Payer: Medicare PPO | Admitting: Internal Medicine

## 2019-08-15 DIAGNOSIS — I69359 Hemiplegia and hemiparesis following cerebral infarction affecting unspecified side: Secondary | ICD-10-CM | POA: Diagnosis not present

## 2019-08-15 DIAGNOSIS — E1169 Type 2 diabetes mellitus with other specified complication: Secondary | ICD-10-CM | POA: Diagnosis not present

## 2019-08-15 DIAGNOSIS — R972 Elevated prostate specific antigen [PSA]: Secondary | ICD-10-CM | POA: Diagnosis not present

## 2019-08-15 DIAGNOSIS — E785 Hyperlipidemia, unspecified: Secondary | ICD-10-CM | POA: Diagnosis not present

## 2019-08-15 DIAGNOSIS — I10 Essential (primary) hypertension: Secondary | ICD-10-CM

## 2019-08-15 DIAGNOSIS — E1159 Type 2 diabetes mellitus with other circulatory complications: Secondary | ICD-10-CM

## 2019-08-15 NOTE — Assessment & Plan Note (Signed)
Lipitor 

## 2019-08-15 NOTE — Assessment & Plan Note (Signed)
No change. Working out at [employer] 6 d/week

## 2019-08-15 NOTE — Assessment & Plan Note (Signed)
BP Readings from Last 3 Encounters:  08/15/19 124/78  07/01/19 139/80  06/09/19 118/64

## 2019-08-15 NOTE — Assessment & Plan Note (Signed)
PSA

## 2019-08-15 NOTE — Assessment & Plan Note (Signed)
Metformin, Trajenta 

## 2019-08-15 NOTE — Progress Notes (Signed)
Subjective:  Patient ID: Christian Clements, male    DOB: 07/14/1950  Age: 70 y.o. MRN: [id number]  CC: Annual Exam   HPI Christian Clements presents for DM, HTN, CVA f/u  Outpatient Medications Prior to Visit  Medication Sig Dispense Refill  . amLODipine-valsartan (EXFORGE) 5-320 MG tablet TAKE 1 TABLET BY MOUTH EVERY DAY 90 tablet 3  . aspirin 325 MG tablet Take 1 tablet (325 mg total) by mouth daily. 100 tablet 3  . atorvastatin (LIPITOR) 20 MG tablet TAKE 1 TABLET BY MOUTH EVERY DAY 90 tablet 3  . b complex vitamins tablet Take 1 tablet by mouth daily. 100 tablet 3  . baclofen (LIORESAL) 10 MG tablet Take 1 tablet (10 mg total) by mouth 3 (three) times daily. 270 tablet 3  . bismuth-metronidazole-tetracycline (PYLERA) 140-125-125 MG capsule Take 3 capsules by mouth 4 (four) times daily -  before meals and at bedtime. 168 capsule 0  . blood glucose meter kit and supplies KIT Dispense based on patient and insurance preference. Use two times daily as directed. (FOR ICD-9 250.00, 250.01). 1 each 0  . chlorhexidine (PERIDEX) 0.12 % solution     . Cholecalciferol 1000 UNITS tablet Take 1,000 Units by mouth daily.      Marland Kitchen doxazosin (CARDURA) 2 MG tablet TAKE 1 TABLET BY MOUTH EVERY DAY 90 tablet 3  . fluconazole (DIFLUCAN) 100 MG tablet Take 1 tablet (100 mg total) by mouth daily. 7 tablet 0  . gabapentin (NEURONTIN) 100 MG capsule Take 2 capsules (200 mg total) by mouth at bedtime. 60 capsule 3  . glucose blood (ONETOUCH VERIO) test strip Use to check blood sugars twice a day Dx E11.9 Yearly physical w/labs are due must see MD for refills 100 each 0  . metFORMIN (GLUCOPHAGE) 1000 MG tablet TAKE 1 TABLET (1,000 MG TOTAL) BY MOUTH 2 (TWO) TIMES DAILY WITH A MEAL. 180 tablet 3  . pantoprazole (PROTONIX) 40 MG tablet TAKE 1 TABLET BY MOUTH EVERY DAY 90 tablet 1  . Psyllium (METAMUCIL PO) Take by mouth 2 (two) times a day.    . sildenafil (VIAGRA) 100 MG tablet Take 1 tablet (100 mg total) by mouth  daily as needed. 10 tablet 11  . TRADJENTA 5 MG TABS tablet TAKE 1 TABLET BY MOUTH EVERY DAY 30 tablet 11  . triamcinolone cream (KENALOG) 0.1 % Apply 1 application topically 2 (two) times daily. 80 g 3   Facility-Administered Medications Prior to Visit  Medication Dose Route Frequency Provider Last Rate Last Admin  . 0.9 %  sodium chloride infusion  500 mL Intravenous Once Ladene Artist, MD        ROS: Review of Systems  Constitutional: Negative for appetite change, fatigue and unexpected weight change.  HENT: Negative for congestion, nosebleeds, sneezing, sore throat and trouble swallowing.   Eyes: Negative for itching and visual disturbance.  Respiratory: Negative for cough.   Cardiovascular: Negative for chest pain, palpitations and leg swelling.  Gastrointestinal: Negative for abdominal distention, blood in stool, diarrhea and nausea.  Genitourinary: Negative for frequency and hematuria.  Musculoskeletal: Negative for back pain, gait problem, joint swelling and neck pain.  Skin: Negative for rash.  Neurological: Negative for dizziness, tremors, speech difficulty and weakness.  Psychiatric/Behavioral: Negative for agitation, dysphoric mood and sleep disturbance. The patient is not nervous/anxious.     Objective:  BP 124/78 (BP Location: Left Arm, Patient Position: Sitting, Cuff Size: Large)   Pulse (!) 55   Temp 98.2 F (36.8  C) (Oral)   Ht '5\' 10"'$  (1.778 m)   Wt 217 lb 3.2 oz (98.5 kg)   SpO2 97%   BMI 31.16 kg/m   BP Readings from Last 3 Encounters:  08/15/19 124/78  07/01/19 139/80  06/09/19 118/64    Wt Readings from Last 3 Encounters:  08/15/19 217 lb 3.2 oz (98.5 kg)  07/01/19 217 lb (98.4 kg)  06/09/19 221 lb (100.2 kg)    Physical Exam Constitutional:      General: He is not in acute distress.    Appearance: He is well-developed.     Comments: NAD  Eyes:     Conjunctiva/sclera: Conjunctivae normal.     Pupils: Pupils are equal, round, and reactive  to light.  Neck:     Thyroid: No thyromegaly.     Vascular: No JVD.  Cardiovascular:     Rate and Rhythm: Normal rate and regular rhythm.     Heart sounds: Normal heart sounds. No murmur. No friction rub. No gallop.   Pulmonary:     Effort: Pulmonary effort is normal. No respiratory distress.     Breath sounds: Normal breath sounds. No wheezing or rales.  Chest:     Chest wall: No tenderness.  Abdominal:     General: Bowel sounds are normal. There is no distension.     Palpations: Abdomen is soft. There is no mass.     Tenderness: There is no abdominal tenderness. There is no guarding or rebound.  Musculoskeletal:        General: No tenderness. Normal range of motion.     Cervical back: Normal range of motion.  Lymphadenopathy:     Cervical: No cervical adenopathy.  Skin:    General: Skin is warm and dry.     Findings: No rash.  Neurological:     Mental Status: He is alert and oriented to person, place, and time.     Cranial Nerves: No cranial nerve deficit.     Motor: No abnormal muscle tone.     Coordination: Coordination normal.     Gait: Gait normal.     Deep Tendon Reflexes: Reflexes are normal and symmetric.  Psychiatric:        Behavior: Behavior normal.        Thought Content: Thought content normal.        Judgment: Judgment normal.   Slightly ataxic L knee  Lab Results  Component Value Date   WBC 8.1 02/11/2019   HGB 15.7 02/11/2019   HCT 45.4 02/11/2019   PLT 269.0 02/11/2019   GLUCOSE 120 (H) 08/12/2019   CHOL 95 02/11/2019   TRIG 112.0 02/11/2019   HDL 41.20 02/11/2019   LDLDIRECT 30.1 11/14/2010   LDLCALC 31 02/11/2019   ALT 23 02/11/2019   AST 18 02/11/2019   NA 137 08/12/2019   K 4.2 08/12/2019   CL 105 08/12/2019   CREATININE 0.99 08/12/2019   BUN 14 08/12/2019   CO2 26 08/12/2019   TSH 2.76 02/11/2019   PSA 2.18 02/11/2019   INR 1.2 (H) 05/15/2016   HGBA1C 6.5 08/12/2019   MICROALBUR 4.0 (H) 02/11/2019    DG HIP UNILAT WITH PELVIS  2-3 VIEWS RIGHT  Result Date: 11/27/2016 CLINICAL DATA:  Right hip pain EXAM: DG HIP (WITH OR WITHOUT PELVIS) 2-3V RIGHT COMPARISON:  10/27/2016 FINDINGS: There is no evidence of hip fracture or dislocation. There is no evidence of arthropathy or other focal bone abnormality. IMPRESSION: Negative. Electronically Signed   By: Juanda Crumble  Carlis Abbott M.D.   On: 11/27/2016 15:58    Assessment & Plan:   Walker Kehr, MD

## 2019-08-21 ENCOUNTER — Other Ambulatory Visit: Payer: Self-pay | Admitting: Endocrinology

## 2019-08-25 DIAGNOSIS — M25562 Pain in left knee: Secondary | ICD-10-CM | POA: Diagnosis not present

## 2019-08-31 DIAGNOSIS — S83242A Other tear of medial meniscus, current injury, left knee, initial encounter: Secondary | ICD-10-CM | POA: Diagnosis not present

## 2019-08-31 DIAGNOSIS — M25562 Pain in left knee: Secondary | ICD-10-CM | POA: Diagnosis not present

## 2019-11-27 ENCOUNTER — Other Ambulatory Visit: Payer: Self-pay | Admitting: Internal Medicine

## 2019-12-07 DIAGNOSIS — M25562 Pain in left knee: Secondary | ICD-10-CM | POA: Diagnosis not present

## 2019-12-07 DIAGNOSIS — M1712 Unilateral primary osteoarthritis, left knee: Secondary | ICD-10-CM | POA: Diagnosis not present

## 2019-12-13 ENCOUNTER — Encounter: Payer: Self-pay | Admitting: Endocrinology

## 2019-12-13 ENCOUNTER — Ambulatory Visit: Payer: Medicare PPO | Admitting: Endocrinology

## 2019-12-13 ENCOUNTER — Other Ambulatory Visit: Payer: Self-pay

## 2019-12-13 VITALS — BP 156/80 | HR 60 | Ht 70.0 in | Wt 217.0 lb

## 2019-12-13 DIAGNOSIS — E1159 Type 2 diabetes mellitus with other circulatory complications: Secondary | ICD-10-CM

## 2019-12-13 LAB — POCT GLYCOSYLATED HEMOGLOBIN (HGB A1C): Hemoglobin A1C: 6.4 % — AB (ref 4.0–5.6)

## 2019-12-13 NOTE — Patient Instructions (Addendum)
Your blood pressure is high today.  Please see your primary care provider soon, to have it rechecked check your blood sugar once a day.  vary the time of day when you check, between before the 3 meals, and at bedtime.  also check if you have symptoms of your blood sugar being too high or too low.  please keep a record of the readings and bring it to your next appointment here (or you can bring the meter itself).  You can write it on any piece of paper.  please call us sooner if your blood sugar goes below 70, or if you have a lot of readings over 200.   Please continue the same diabetes medications.   Please come back for a follow-up appointment in 6 months.      

## 2019-12-13 NOTE — Progress Notes (Signed)
Subjective:    Patient ID: Christian Clements, male    DOB: Oct 13, 1950, 69 y.o.   MRN: [id number]  HPI Pt returns for f/u of diabetes mellitus:  DM type: 2 Dx'ed: 6333 Complications: CVA and PN Therapy: 2 oral meds.  DKA: never Severe hypoglycemia: never.  Pancreatitis: never.  Other: he took insulin for 1 month, in 2017.   Interval history: he says cbg's are well-controlled.  pt states he feels well in general.   He takes meds as rx'ed.   Past Medical History:  Diagnosis Date  . Diabetes mellitus   . Hypertension   . Prostatitis 2011  . Stroke [facility])     Past Surgical History:  Procedure Laterality Date  . COLONOSCOPY    . None      Social History   Socioeconomic History  . Marital status: Married    Spouse name: Not on file  . Number of children: 0  . Years of education: 16  . Highest education level: Bachelor's degree (e.g., BA, AB, BS)  Occupational History  . Occupation: PE Product manager: [employer]  Tobacco Use  . Smoking status: Former Research scientist (life sciences)  . Smokeless tobacco: Never Used  Vaping Use  . Vaping Use: Never used  Substance and Sexual Activity  . Alcohol use: Yes    Alcohol/week: 3.0 standard drinks    Types: 3 Glasses of wine per week    Comment: Socially  . Drug use: No  . Sexual activity: Yes  Other Topics Concern  . Not on file  Social History Narrative   Regular exercise- yes.  Lives with wife in a 2 story home.  Has no children.     Retired Careers information officer.     Education: college.       Social Determinants of Health   Financial Resource Strain:   . Difficulty of Paying Living Expenses: Not on file  Food Insecurity:   . Worried About Charity fundraiser in the Last Year: Not on file  . Ran Out of Food in the Last Year: Not on file  Transportation Needs:   . Lack of Transportation (Medical): Not on file  . Lack of Transportation (Non-Medical): Not on file  Physical Activity:   . Days of Exercise per Week: Not on file  .  Minutes of Exercise per Session: Not on file  Stress:   . Feeling of Stress : Not on file  Social Connections:   . Frequency of Communication with Friends and Family: Not on file  . Frequency of Social Gatherings with Friends and Family: Not on file  . Attends Religious Services: Not on file  . Active Member of Clubs or Organizations: Not on file  . Attends Archivist Meetings: Not on file  . Marital Status: Not on file  Intimate Partner Violence:   . Fear of Current or Ex-Partner: Not on file  . Emotionally Abused: Not on file  . Physically Abused: Not on file  . Sexually Abused: Not on file    Current Outpatient Medications on File Prior to Visit  Medication Sig Dispense Refill  . amLODipine-valsartan (EXFORGE) 5-320 MG tablet TAKE 1 TABLET BY MOUTH EVERY DAY 90 tablet 3  . aspirin 325 MG tablet Take 1 tablet (325 mg total) by mouth daily. 100 tablet 3  . atorvastatin (LIPITOR) 20 MG tablet TAKE 1 TABLET BY MOUTH EVERY DAY 90 tablet 3  . b complex vitamins tablet Take 1 tablet by mouth  daily. 100 tablet 3  . baclofen (LIORESAL) 10 MG tablet Take 1 tablet (10 mg total) by mouth 3 (three) times daily. 270 tablet 3  . blood glucose meter kit and supplies KIT Dispense based on patient and insurance preference. Use two times daily as directed. (FOR ICD-9 250.00, 250.01). 1 each 0  . Cholecalciferol 1000 UNITS tablet Take 1,000 Units by mouth daily.      Marland Kitchen doxazosin (CARDURA) 2 MG tablet TAKE 1 TABLET BY MOUTH EVERY DAY 90 tablet 3  . glucose blood (ONETOUCH VERIO) test strip Use to check blood sugars twice a day Dx E11.9 Yearly physical w/labs are due must see MD for refills 100 each 0  . metFORMIN (GLUCOPHAGE) 1000 MG tablet TAKE 1 TABLET (1,000 MG TOTAL) BY MOUTH 2 (TWO) TIMES DAILY WITH A MEAL. 180 tablet 3  . pantoprazole (PROTONIX) 40 MG tablet TAKE 1 TABLET BY MOUTH EVERY DAY 90 tablet 1  . Psyllium (METAMUCIL PO) Take by mouth 2 (two) times a day.    . sildenafil  (VIAGRA) 100 MG tablet Take 1 tablet (100 mg total) by mouth daily as needed. 10 tablet 11  . TRADJENTA 5 MG TABS tablet TAKE 1 TABLET BY MOUTH EVERY DAY 30 tablet 11  . triamcinolone cream (KENALOG) 0.1 % APPLY TO AFFECTED AREA TWICE A DAY 80 g 3   Current Facility-Administered Medications on File Prior to Visit  Medication Dose Route Frequency Provider Last Rate Last Admin  . 0.9 %  sodium chloride infusion  500 mL Intravenous Once Ladene Artist, MD        Allergies  Allergen Reactions  . Accuretic [Quinapril-Hydrochlorothiazide]     Drug rash ?    Family History  Problem Relation Age of Onset  . Cancer Mother 10       leukemia  . Hypertension Other   . Diabetes Maternal Grandfather   . Stroke Sister   . Cancer Brother   . HIV/AIDS Brother   . Colon cancer Neg Hx   . Esophageal cancer Neg Hx   . Liver cancer Neg Hx   . Rectal cancer Neg Hx   . Stomach cancer Neg Hx   . Colon polyps Neg Hx     BP (!) 156/80   Pulse 60   Ht $R'5\' 10"'IL$  (1.778 m)   Wt 217 lb (98.4 kg)   SpO2 96%   BMI 31.14 kg/m    Review of Systems Denies nausea.      Objective:   Physical Exam VITAL SIGNS:  See vs page GENERAL: no distress Pulses: dorsalis pedis intact bilat.   MSK: no deformity of the feet CV: no leg edema Skin:  no ulcer on the feet.  normal color and temp on the feet. Neuro: sensation is intact to touch on the feet Ext: there is bilateral onychomycosis of the toenails  Lab Results  Component Value Date   HGBA1C 6.4 (A) 12/13/2019       Assessment & Plan:  HTN: is noted today Type 2 DM: well-controlled.  Please continue the same medications  Patient Instructions  Your blood pressure is high today.  Please see your primary care provider soon, to have it rechecked check your blood sugar once a day.  vary the time of day when you check, between before the 3 meals, and at bedtime.  also check if you have symptoms of your blood sugar being too high or too low.  please  keep a record of the readings  and bring it to your next appointment here (or you can bring the meter itself).  You can write it on any piece of paper.  please call us sooner if your blood sugar goes below 70, or if you have a lot of readings over 200. Please continue the same diabetes medications.  Please come back for a follow-up appointment in 6 months.

## 2020-02-03 ENCOUNTER — Telehealth: Payer: Self-pay | Admitting: Internal Medicine

## 2020-02-03 NOTE — Telephone Encounter (Signed)
LVM for pt to rtn my call to schedule AWV with [facility]. Please schedule appt if pt calls the office.  

## 2020-02-10 ENCOUNTER — Other Ambulatory Visit (INDEPENDENT_AMBULATORY_CARE_PROVIDER_SITE_OTHER): Payer: Medicare PPO

## 2020-02-10 DIAGNOSIS — R972 Elevated prostate specific antigen [PSA]: Secondary | ICD-10-CM | POA: Diagnosis not present

## 2020-02-10 DIAGNOSIS — E1159 Type 2 diabetes mellitus with other circulatory complications: Secondary | ICD-10-CM

## 2020-02-10 DIAGNOSIS — E785 Hyperlipidemia, unspecified: Secondary | ICD-10-CM

## 2020-02-10 DIAGNOSIS — E1169 Type 2 diabetes mellitus with other specified complication: Secondary | ICD-10-CM

## 2020-02-10 DIAGNOSIS — I69359 Hemiplegia and hemiparesis following cerebral infarction affecting unspecified side: Secondary | ICD-10-CM

## 2020-02-10 LAB — CBC WITH DIFFERENTIAL/PLATELET
Basophils Absolute: 0.1 10*3/uL (ref 0.0–0.1)
Basophils Relative: 1 % (ref 0.0–3.0)
Eosinophils Absolute: 0.1 10*3/uL (ref 0.0–0.7)
Eosinophils Relative: 1.7 % (ref 0.0–5.0)
HCT: 42.8 % (ref 39.0–52.0)
Hemoglobin: 14.6 g/dL (ref 13.0–17.0)
Lymphocytes Relative: 37.7 % (ref 12.0–46.0)
Lymphs Abs: 2.7 10*3/uL (ref 0.7–4.0)
MCHC: 34 g/dL (ref 30.0–36.0)
MCV: 93.2 fl (ref 78.0–100.0)
Monocytes Absolute: 0.4 10*3/uL (ref 0.1–1.0)
Monocytes Relative: 6.1 % (ref 3.0–12.0)
Neutro Abs: 3.8 10*3/uL (ref 1.4–7.7)
Neutrophils Relative %: 53.5 % (ref 43.0–77.0)
Platelets: 289 10*3/uL (ref 150.0–400.0)
RBC: 4.6 Mil/uL (ref 4.22–5.81)
RDW: 13.7 % (ref 11.5–15.5)
WBC: 7.2 10*3/uL (ref 4.0–10.5)

## 2020-02-10 LAB — URINALYSIS
Bilirubin Urine: NEGATIVE
Ketones, ur: NEGATIVE
Leukocytes,Ua: NEGATIVE
Nitrite: NEGATIVE
Specific Gravity, Urine: >=1.03 — AB (ref 1.000–1.030)
Total Protein, Urine: NEGATIVE
Urine Glucose: NEGATIVE
Urobilinogen, UA: 0.2 (ref 0.0–1.0)
pH: 5.5 (ref 5.0–8.0)

## 2020-02-10 LAB — BASIC METABOLIC PANEL
BUN: 12 mg/dL (ref 6–23)
CO2: 27 mEq/L (ref 19–32)
Calcium: 9.4 mg/dL (ref 8.4–10.5)
Chloride: 106 mEq/L (ref 96–112)
Creatinine, Ser: 0.89 mg/dL (ref 0.40–1.50)
GFR: 87.19 mL/min (ref >60.00)
Glucose, Bld: 116 mg/dL — ABNORMAL HIGH (ref 70–99)
Potassium: 3.9 mEq/L (ref 3.5–5.1)
Sodium: 143 mEq/L (ref 135–145)

## 2020-02-10 LAB — PSA: PSA: 3.76 ng/mL (ref 0.10–4.00)

## 2020-02-10 LAB — LIPID PANEL
Cholesterol: 71 mg/dL (ref 0–200)
HDL: 38.5 mg/dL — ABNORMAL LOW (ref >39.00)
LDL Cholesterol: 19 mg/dL (ref 0–99)
NonHDL: 32.56
Total CHOL/HDL Ratio: 2
Triglycerides: 67 mg/dL (ref 0.0–149.0)
VLDL: 13.4 mg/dL (ref 0.0–40.0)

## 2020-02-10 LAB — TSH: TSH: 3.27 u[IU]/mL (ref 0.35–4.50)

## 2020-02-10 LAB — HEPATIC FUNCTION PANEL
ALT: 19 U/L (ref 0–53)
AST: 18 U/L (ref 0–37)
Albumin: 4.7 g/dL (ref 3.5–5.2)
Alkaline Phosphatase: 63 U/L (ref 39–117)
Bilirubin, Direct: 0.2 mg/dL (ref 0.0–0.3)
Total Bilirubin: 0.8 mg/dL (ref 0.2–1.2)
Total Protein: 7.3 g/dL (ref 6.0–8.3)

## 2020-02-15 ENCOUNTER — Other Ambulatory Visit: Payer: Self-pay

## 2020-02-15 ENCOUNTER — Encounter: Payer: Self-pay | Admitting: Internal Medicine

## 2020-02-15 ENCOUNTER — Ambulatory Visit: Payer: Medicare PPO | Admitting: Internal Medicine

## 2020-02-15 VITALS — BP 130/72 | HR 65 | Temp 98.4°F | Wt 214.0 lb

## 2020-02-15 DIAGNOSIS — Z23 Encounter for immunization: Secondary | ICD-10-CM | POA: Diagnosis not present

## 2020-02-15 DIAGNOSIS — E1159 Type 2 diabetes mellitus with other circulatory complications: Secondary | ICD-10-CM

## 2020-02-15 DIAGNOSIS — R972 Elevated prostate specific antigen [PSA]: Secondary | ICD-10-CM

## 2020-02-15 DIAGNOSIS — I69359 Hemiplegia and hemiparesis following cerebral infarction affecting unspecified side: Secondary | ICD-10-CM

## 2020-02-15 DIAGNOSIS — E1169 Type 2 diabetes mellitus with other specified complication: Secondary | ICD-10-CM | POA: Diagnosis not present

## 2020-02-15 DIAGNOSIS — E785 Hyperlipidemia, unspecified: Secondary | ICD-10-CM | POA: Diagnosis not present

## 2020-02-15 DIAGNOSIS — G3184 Mild cognitive impairment, so stated: Secondary | ICD-10-CM

## 2020-02-15 NOTE — Progress Notes (Signed)
Subjective:  Patient ID: Christian Clements, male    DOB: [birth year]  Age: 69 y.o. MRN: [id number]  CC: Follow-up (3 month f/u- flu shot)   HPI Christian Clements presents for DM, HTN, CVA f/u  Outpatient Medications Prior to Visit  Medication Sig Dispense Refill  . amLODipine-valsartan (EXFORGE) 5-320 MG tablet TAKE 1 TABLET BY MOUTH EVERY DAY 90 tablet 3  . aspirin 325 MG tablet Take 1 tablet (325 mg total) by mouth daily. 100 tablet 3  . atorvastatin (LIPITOR) 20 MG tablet TAKE 1 TABLET BY MOUTH EVERY DAY 90 tablet 3  . b complex vitamins tablet Take 1 tablet by mouth daily. 100 tablet 3  . baclofen (LIORESAL) 10 MG tablet Take 1 tablet (10 mg total) by mouth 3 (three) times daily. 270 tablet 3  . blood glucose meter kit and supplies KIT Dispense based on patient and insurance preference. Use two times daily as directed. (FOR ICD-9 250.00, 250.01). 1 each 0  . Cholecalciferol 1000 UNITS tablet Take 1,000 Units by mouth daily.      Marland Kitchen doxazosin (CARDURA) 2 MG tablet TAKE 1 TABLET BY MOUTH EVERY DAY 90 tablet 3  . glucose blood (ONETOUCH VERIO) test strip Use to check blood sugars twice a day Dx E11.9 Yearly physical w/labs are due must see MD for refills 100 each 0  . metFORMIN (GLUCOPHAGE) 1000 MG tablet TAKE 1 TABLET (1,000 MG TOTAL) BY MOUTH 2 (TWO) TIMES DAILY WITH A MEAL. 180 tablet 3  . pantoprazole (PROTONIX) 40 MG tablet TAKE 1 TABLET BY MOUTH EVERY DAY 90 tablet 1  . Psyllium (METAMUCIL PO) Take by mouth 2 (two) times a day.    . sildenafil (VIAGRA) 100 MG tablet Take 1 tablet (100 mg total) by mouth daily as needed. 10 tablet 11  . TRADJENTA 5 MG TABS tablet TAKE 1 TABLET BY MOUTH EVERY DAY 30 tablet 11  . triamcinolone cream (KENALOG) 0.1 % APPLY TO AFFECTED AREA TWICE A DAY 80 g 3  . 0.9 %  sodium chloride infusion      No facility-administered medications prior to visit.    ROS: Review of Systems  Constitutional: Negative for appetite change, fatigue and unexpected weight  change.  HENT: Negative for congestion, nosebleeds, sneezing, sore throat and trouble swallowing.   Eyes: Negative for itching and visual disturbance.  Respiratory: Negative for cough.   Cardiovascular: Negative for chest pain, palpitations and leg swelling.  Gastrointestinal: Negative for abdominal distention, blood in stool, diarrhea and nausea.  Genitourinary: Negative for frequency and hematuria.  Musculoskeletal: Negative for back pain, gait problem, joint swelling and neck pain.  Skin: Negative for rash.  Neurological: Negative for dizziness, tremors, speech difficulty and weakness.  Psychiatric/Behavioral: Positive for decreased concentration. Negative for agitation, dysphoric mood and sleep disturbance. The patient is not nervous/anxious.     Objective:  BP 130/72 (BP Location: Left Arm)   Pulse 65   Temp 98.4 F (36.9 C) (Oral)   Wt 214 lb (97.1 kg)   SpO2 97%   BMI 30.71 kg/m   BP Readings from Last 3 Encounters:  02/15/20 130/72  12/13/19 (!) 156/80  08/15/19 124/78    Wt Readings from Last 3 Encounters:  02/15/20 214 lb (97.1 kg)  12/13/19 217 lb (98.4 kg)  08/15/19 217 lb 3.2 oz (98.5 kg)    Physical Exam Constitutional:      General: He is not in acute distress.    Appearance: He is well-developed.  Comments: NAD  Eyes:     Conjunctiva/sclera: Conjunctivae normal.     Pupils: Pupils are equal, round, and reactive to light.  Neck:     Thyroid: No thyromegaly.     Vascular: No JVD.  Cardiovascular:     Rate and Rhythm: Normal rate and regular rhythm.     Heart sounds: Normal heart sounds. No murmur heard.  No friction rub. No gallop.   Pulmonary:     Effort: Pulmonary effort is normal. No respiratory distress.     Breath sounds: Normal breath sounds. No wheezing or rales.  Chest:     Chest wall: No tenderness.  Abdominal:     General: Bowel sounds are normal. There is no distension.     Palpations: Abdomen is soft. There is no mass.      Tenderness: There is no abdominal tenderness. There is no guarding or rebound.  Musculoskeletal:        General: No tenderness. Normal range of motion.     Cervical back: Normal range of motion.  Lymphadenopathy:     Cervical: No cervical adenopathy.  Skin:    General: Skin is warm and dry.     Findings: No rash.  Neurological:     Mental Status: He is alert and oriented to person, place, and time.     Cranial Nerves: No cranial nerve deficit.     Motor: No weakness or abnormal muscle tone.     Coordination: Coordination normal.     Gait: Gait normal.     Deep Tendon Reflexes: Reflexes are normal and symmetric.  Psychiatric:        Behavior: Behavior normal.        Thought Content: Thought content normal.        Judgment: Judgment normal.   rectal - declined  Lab Results  Component Value Date   WBC 7.2 02/10/2020   HGB 14.6 02/10/2020   HCT 42.8 02/10/2020   PLT 289.0 02/10/2020   GLUCOSE 116 (H) 02/10/2020   CHOL 71 02/10/2020   TRIG 67.0 02/10/2020   HDL 38.50 (L) 02/10/2020   LDLDIRECT 30.1 11/14/2010   LDLCALC 19 02/10/2020   ALT 19 02/10/2020   AST 18 02/10/2020   NA 143 02/10/2020   K 3.9 02/10/2020   CL 106 02/10/2020   CREATININE 0.89 02/10/2020   BUN 12 02/10/2020   CO2 27 02/10/2020   TSH 3.27 02/10/2020   PSA 3.76 02/10/2020   INR 1.2 (H) 05/15/2016   HGBA1C 6.4 (A) 12/13/2019   MICROALBUR 4.0 (H) 02/11/2019    DG HIP UNILAT WITH PELVIS 2-3 VIEWS RIGHT  Result Date: 11/27/2016 CLINICAL DATA:  Right hip pain EXAM: DG HIP (WITH OR WITHOUT PELVIS) 2-3V RIGHT COMPARISON:  10/27/2016 FINDINGS: There is no evidence of hip fracture or dislocation. There is no evidence of arthropathy or other focal bone abnormality. IMPRESSION: Negative. Electronically Signed   By: Franchot Gallo M.D.   On: 11/27/2016 15:58    Assessment & Plan:   There are no diagnoses linked to this encounter.   No orders of the defined types were placed in this  encounter.    Follow-up: No follow-ups on file.  Walker Kehr, MD

## 2020-02-15 NOTE — Patient Instructions (Signed)
   B-complex with Niacin 100 mg    Lion's mane  

## 2020-02-15 NOTE — Assessment & Plan Note (Addendum)
Slow PSA climb Re-check in 4 mo Lab Results  Component Value Date   PSA 3.76 02/10/2020   PSA 2.18 02/11/2019   PSA 2.34 09/14/2017

## 2020-02-15 NOTE — Assessment & Plan Note (Signed)
Lipitor 

## 2020-02-15 NOTE — Assessment & Plan Note (Signed)
B complex, Lion's mane

## 2020-02-15 NOTE — Assessment & Plan Note (Signed)
Metformin, Trajenta 

## 2020-02-15 NOTE — Addendum Note (Signed)
Addended by: Earnstine Regal on: 02/15/2020 08:40 AM   Modules accepted: Orders

## 2020-02-15 NOTE — Assessment & Plan Note (Signed)
Try Lion's mane 

## 2020-02-17 ENCOUNTER — Other Ambulatory Visit: Payer: Self-pay | Admitting: Internal Medicine

## 2020-03-23 ENCOUNTER — Other Ambulatory Visit: Payer: Self-pay | Admitting: Internal Medicine

## 2020-03-26 DIAGNOSIS — H5203 Hypermetropia, bilateral: Secondary | ICD-10-CM | POA: Diagnosis not present

## 2020-03-26 DIAGNOSIS — H2513 Age-related nuclear cataract, bilateral: Secondary | ICD-10-CM | POA: Diagnosis not present

## 2020-03-26 DIAGNOSIS — E119 Type 2 diabetes mellitus without complications: Secondary | ICD-10-CM | POA: Diagnosis not present

## 2020-03-26 DIAGNOSIS — H524 Presbyopia: Secondary | ICD-10-CM | POA: Diagnosis not present

## 2020-03-26 DIAGNOSIS — H353111 Nonexudative age-related macular degeneration, right eye, early dry stage: Secondary | ICD-10-CM | POA: Diagnosis not present

## 2020-04-23 ENCOUNTER — Telehealth: Payer: Self-pay | Admitting: Internal Medicine

## 2020-04-23 NOTE — Telephone Encounter (Signed)
LVM for pt to rtn my call to schedule AWV with [facility]. Please schedule this appt if pt calls the office.  °

## 2020-05-16 ENCOUNTER — Other Ambulatory Visit (INDEPENDENT_AMBULATORY_CARE_PROVIDER_SITE_OTHER): Payer: Medicare PPO

## 2020-05-16 ENCOUNTER — Other Ambulatory Visit: Payer: Self-pay

## 2020-05-16 DIAGNOSIS — R972 Elevated prostate specific antigen [PSA]: Secondary | ICD-10-CM

## 2020-05-16 DIAGNOSIS — E1159 Type 2 diabetes mellitus with other circulatory complications: Secondary | ICD-10-CM | POA: Diagnosis not present

## 2020-05-16 LAB — COMPREHENSIVE METABOLIC PANEL
ALT: 21 U/L (ref 0–53)
AST: 18 U/L (ref 0–37)
Albumin: 4.9 g/dL (ref 3.5–5.2)
Alkaline Phosphatase: 58 U/L (ref 39–117)
BUN: 13 mg/dL (ref 6–23)
CO2: 27 mEq/L (ref 19–32)
Calcium: 10.1 mg/dL (ref 8.4–10.5)
Chloride: 105 mEq/L (ref 96–112)
Creatinine, Ser: 0.97 mg/dL (ref 0.40–1.50)
GFR: 79.28 mL/min (ref >60.00)
Glucose, Bld: 115 mg/dL — ABNORMAL HIGH (ref 70–99)
Potassium: 4.6 mEq/L (ref 3.5–5.1)
Sodium: 141 mEq/L (ref 135–145)
Total Bilirubin: 1 mg/dL (ref 0.2–1.2)
Total Protein: 7.5 g/dL (ref 6.0–8.3)

## 2020-05-16 LAB — HEMOGLOBIN A1C: Hgb A1c MFr Bld: 6.7 % — ABNORMAL HIGH (ref 4.6–6.5)

## 2020-05-17 ENCOUNTER — Ambulatory Visit: Payer: Medicare PPO | Admitting: Internal Medicine

## 2020-05-17 ENCOUNTER — Encounter: Payer: Self-pay | Admitting: Internal Medicine

## 2020-05-17 ENCOUNTER — Other Ambulatory Visit: Payer: Self-pay

## 2020-05-17 VITALS — BP 130/90 | HR 57 | Temp 98.0°F | Ht 70.0 in | Wt 217.8 lb

## 2020-05-17 DIAGNOSIS — E785 Hyperlipidemia, unspecified: Secondary | ICD-10-CM | POA: Diagnosis not present

## 2020-05-17 DIAGNOSIS — G3184 Mild cognitive impairment, so stated: Secondary | ICD-10-CM | POA: Diagnosis not present

## 2020-05-17 DIAGNOSIS — K5900 Constipation, unspecified: Secondary | ICD-10-CM

## 2020-05-17 DIAGNOSIS — R972 Elevated prostate specific antigen [PSA]: Secondary | ICD-10-CM

## 2020-05-17 DIAGNOSIS — E1159 Type 2 diabetes mellitus with other circulatory complications: Secondary | ICD-10-CM | POA: Diagnosis not present

## 2020-05-17 DIAGNOSIS — I152 Hypertension secondary to endocrine disorders: Secondary | ICD-10-CM

## 2020-05-17 DIAGNOSIS — E1169 Type 2 diabetes mellitus with other specified complication: Secondary | ICD-10-CM | POA: Diagnosis not present

## 2020-05-17 DIAGNOSIS — R1310 Dysphagia, unspecified: Secondary | ICD-10-CM

## 2020-05-17 LAB — PSA, TOTAL AND FREE
PSA, % Free: 19 % (calc) — ABNORMAL LOW (ref >25)
PSA, Free: 0.5 ng/mL
PSA, Total: 2.7 ng/mL (ref <=4.0)

## 2020-05-17 MED ORDER — DOCUSATE SODIUM 100 MG PO CAPS: 100.0000 mg | ORAL_CAPSULE | Freq: Two times a day (BID) | ORAL | 11 refills | Status: AC

## 2020-05-17 MED ORDER — FLUCONAZOLE 100 MG PO TABS: ORAL_TABLET | ORAL | 1 refills | Status: DC

## 2020-05-17 NOTE — Assessment & Plan Note (Signed)
difficulty swallowing again - in 2020 EGD w/candida: will give empiric Diflucan

## 2020-05-17 NOTE — Assessment & Plan Note (Signed)
B complex, Lion's mane is helping per pt

## 2020-05-17 NOTE — Progress Notes (Signed)
Subjective:  Patient ID: Christian Clements, male    DOB: 1950-06-28  Age: 70 y.o. MRN: [id number]  CC: Follow-up (3 month f/u)   HPI Christian Clements presents for CVA, HTN, BPH, GERD, DM f/u C/o difficulty swallowing again - 2020 EGD w/candida C/o constipation  Outpatient Medications Prior to Visit  Medication Sig Dispense Refill  . amLODipine-valsartan (EXFORGE) 5-320 MG tablet TAKE 1 TABLET BY MOUTH EVERY DAY 90 tablet 3  . aspirin 325 MG tablet Take 1 tablet (325 mg total) by mouth daily. 100 tablet 3  . atorvastatin (LIPITOR) 20 MG tablet TAKE 1 TABLET BY MOUTH EVERY DAY 90 tablet 3  . b complex vitamins tablet Take 1 tablet by mouth daily. 100 tablet 3  . baclofen (LIORESAL) 10 MG tablet Take 1 tablet (10 mg total) by mouth 3 (three) times daily. 270 tablet 3  . blood glucose meter kit and supplies KIT Dispense based on patient and insurance preference. Use two times daily as directed. (FOR ICD-9 250.00, 250.01). 1 each 0  . Cholecalciferol 1000 UNITS tablet Take 1,000 Units by mouth daily.    Marland Kitchen doxazosin (CARDURA) 2 MG tablet TAKE 1 TABLET BY MOUTH EVERY DAY 90 tablet 3  . glucose blood (ONETOUCH VERIO) test strip Use to check blood sugars twice a day Dx E11.9 Yearly physical w/labs are due must see MD for refills 100 each 0  . metFORMIN (GLUCOPHAGE) 1000 MG tablet TAKE 1 TABLET (1,000 MG TOTAL) BY MOUTH 2 (TWO) TIMES DAILY WITH A MEAL. 180 tablet 3  . pantoprazole (PROTONIX) 40 MG tablet TAKE 1 TABLET BY MOUTH EVERY DAY 90 tablet 1  . Psyllium (METAMUCIL PO) Take by mouth 2 (two) times a day.    . sildenafil (VIAGRA) 100 MG tablet Take 1 tablet (100 mg total) by mouth daily as needed. 10 tablet 11  . TRADJENTA 5 MG TABS tablet TAKE 1 TABLET BY MOUTH EVERY DAY 30 tablet 11  . triamcinolone cream (KENALOG) 0.1 % APPLY TO AFFECTED AREA TWICE A DAY 80 g 3   No facility-administered medications prior to visit.    ROS: Review of Systems  Constitutional: Negative for appetite change,  fatigue and unexpected weight change.  HENT: Negative for congestion, nosebleeds, sneezing, sore throat and trouble swallowing.   Eyes: Negative for itching and visual disturbance.  Respiratory: Negative for cough.   Cardiovascular: Negative for chest pain, palpitations and leg swelling.  Gastrointestinal: Positive for constipation. Negative for abdominal distention, blood in stool, diarrhea and nausea.  Genitourinary: Negative for frequency and hematuria.  Musculoskeletal: Negative for back pain, gait problem, joint swelling and neck pain.  Skin: Negative for rash.  Neurological: Positive for weakness. Negative for dizziness, tremors and speech difficulty.  Psychiatric/Behavioral: Negative for agitation, dysphoric mood and sleep disturbance. The patient is not nervous/anxious.     Objective:  BP 130/90 (BP Location: Left Arm)   Pulse (!) 57   Temp 98 F (36.7 C) (Oral)   Ht 5' 10"  (1.778 m)   Wt 217 lb 12.8 oz (98.8 kg)   SpO2 97%   BMI 31.25 kg/m   BP Readings from Last 3 Encounters:  05/17/20 130/90  02/15/20 130/72  12/13/19 (!) 156/80    Wt Readings from Last 3 Encounters:  05/17/20 217 lb 12.8 oz (98.8 kg)  02/15/20 214 lb (97.1 kg)  12/13/19 217 lb (98.4 kg)    Physical Exam Constitutional:      General: He is not in acute distress.  Appearance: He is well-developed.     Comments: NAD  HENT:     Mouth/Throat:     Mouth: Oropharynx is clear and moist.  Eyes:     Conjunctiva/sclera: Conjunctivae normal.     Pupils: Pupils are equal, round, and reactive to light.  Neck:     Thyroid: No thyromegaly.     Vascular: No JVD.  Cardiovascular:     Rate and Rhythm: Normal rate and regular rhythm.     Pulses: Intact distal pulses.     Heart sounds: Normal heart sounds. No murmur heard. No friction rub. No gallop.   Pulmonary:     Effort: Pulmonary effort is normal. No respiratory distress.     Breath sounds: Normal breath sounds. No wheezing or rales.  Chest:      Chest wall: No tenderness.  Abdominal:     General: Bowel sounds are normal. There is no distension.     Palpations: Abdomen is soft. There is no mass.     Tenderness: There is no abdominal tenderness. There is no guarding or rebound.  Musculoskeletal:        General: No tenderness or edema. Normal range of motion.     Cervical back: Normal range of motion.  Lymphadenopathy:     Cervical: No cervical adenopathy.  Skin:    General: Skin is warm and dry.     Findings: No rash.  Neurological:     Mental Status: He is alert and oriented to person, place, and time.     Cranial Nerves: No cranial nerve deficit.     Motor: No abnormal muscle tone.     Coordination: He displays a negative Romberg sign. Coordination abnormal.     Gait: Gait abnormal.     Deep Tendon Reflexes: Reflexes are normal and symmetric.  Psychiatric:        Mood and Affect: Mood and affect normal.        Behavior: Behavior normal.        Thought Content: Thought content normal.        Judgment: Judgment normal.    No thrush Ataxic a little  Lab Results  Component Value Date   WBC 7.2 02/10/2020   HGB 14.6 02/10/2020   HCT 42.8 02/10/2020   PLT 289.0 02/10/2020   GLUCOSE 115 (H) 05/16/2020   CHOL 71 02/10/2020   TRIG 67.0 02/10/2020   HDL 38.50 (L) 02/10/2020   LDLDIRECT 30.1 11/14/2010   LDLCALC 19 02/10/2020   ALT 21 05/16/2020   AST 18 05/16/2020   NA 141 05/16/2020   K 4.6 05/16/2020   CL 105 05/16/2020   CREATININE 0.97 05/16/2020   BUN 13 05/16/2020   CO2 27 05/16/2020   TSH 3.27 02/10/2020   PSA 3.76 02/10/2020   INR 1.2 (H) 05/15/2016   HGBA1C 6.7 (H) 05/16/2020   MICROALBUR 4.0 (H) 02/11/2019    DG HIP UNILAT WITH PELVIS 2-3 VIEWS RIGHT  Result Date: 11/27/2016 CLINICAL DATA:  Right hip pain EXAM: DG HIP (WITH OR WITHOUT PELVIS) 2-3V RIGHT COMPARISON:  10/27/2016 FINDINGS: There is no evidence of hip fracture or dislocation. There is no evidence of arthropathy or other focal bone  abnormality. IMPRESSION: Negative. Electronically Signed   By: Franchot Gallo M.D.   On: 11/27/2016 15:58    Assessment & Plan:    Walker Kehr, MD

## 2020-05-17 NOTE — Assessment & Plan Note (Signed)
On Lipitor 

## 2020-05-17 NOTE — Assessment & Plan Note (Signed)
Metformin, Trajenta

## 2020-05-17 NOTE — Assessment & Plan Note (Signed)
Exforge, Cardura

## 2020-05-17 NOTE — Assessment & Plan Note (Signed)
Try Colace

## 2020-05-25 ENCOUNTER — Other Ambulatory Visit: Payer: Self-pay | Admitting: Endocrinology

## 2020-06-01 ENCOUNTER — Other Ambulatory Visit: Payer: Self-pay | Admitting: Internal Medicine

## 2020-06-11 ENCOUNTER — Other Ambulatory Visit: Payer: Self-pay

## 2020-06-11 ENCOUNTER — Ambulatory Visit: Payer: Medicare PPO | Admitting: Endocrinology

## 2020-06-11 VITALS — BP 130/86 | HR 80 | Ht 70.0 in | Wt 212.8 lb

## 2020-06-11 DIAGNOSIS — E1159 Type 2 diabetes mellitus with other circulatory complications: Secondary | ICD-10-CM

## 2020-06-11 LAB — POCT GLYCOSYLATED HEMOGLOBIN (HGB A1C): Hemoglobin A1C: 6.5 % — AB (ref 4.0–5.6)

## 2020-06-11 NOTE — Progress Notes (Signed)
Subjective:    Patient ID: Christian Clements, male    DOB: Feb 13, 1951, 70 y.o.   MRN: [id number]  HPI Pt returns for f/u of diabetes mellitus:  DM type: 2 Dx'ed: 1062 Complications: CVA and PN Therapy: 2 oral meds.   DKA: never.   Severe hypoglycemia: never.  Pancreatitis: never.  Other: he took insulin for 1 month, in 2017.   Interval history: he says cbg's are well-controlled.  pt states he feels well in general.   He takes meds as rx'ed.   Past Medical History:  Diagnosis Date   Diabetes mellitus    Hypertension    Prostatitis 2011   Stroke [facility])     Past Surgical History:  Procedure Laterality Date   COLONOSCOPY     None      Social History   Socioeconomic History   Marital status: Married    Spouse name: Not on file   Number of children: 0   Years of education: 16   Highest education level: Bachelor's degree (e.g., BA, AB, BS)  Occupational History   Occupation: PE teacher    Employer: [employer]  Tobacco Use   Smoking status: Former Smoker   Smokeless tobacco: Never Used  Scientific laboratory technician Use: Never used  Substance and Sexual Activity   Alcohol use: Yes    Alcohol/week: 3.0 standard drinks    Types: 3 Glasses of wine per week    Comment: Socially   Drug use: No   Sexual activity: Yes  Other Topics Concern   Not on file  Social History Narrative   Regular exercise- yes.  Lives with wife in a 2 story home.  Has no children.     Retired Careers information officer.     Education: college.       Social Determinants of Health   Financial Resource Strain: Not on file  Food Insecurity: Not on file  Transportation Needs: Not on file  Physical Activity: Not on file  Stress: Not on file  Social Connections: Not on file  Intimate Partner Violence: Not on file    Current Outpatient Medications on File Prior to Visit  Medication Sig Dispense Refill   amLODipine-valsartan (EXFORGE) 5-320 MG tablet TAKE 1 TABLET BY MOUTH EVERY DAY 90  tablet 3   aspirin 325 MG tablet Take 1 tablet (325 mg total) by mouth daily. 100 tablet 3   atorvastatin (LIPITOR) 20 MG tablet TAKE 1 TABLET BY MOUTH EVERY DAY 90 tablet 3   b complex vitamins tablet Take 1 tablet by mouth daily. 100 tablet 3   baclofen (LIORESAL) 10 MG tablet Take 1 tablet (10 mg total) by mouth 3 (three) times daily. 270 tablet 3   blood glucose meter kit and supplies KIT Dispense based on patient and insurance preference. Use two times daily as directed. (FOR ICD-9 250.00, 250.01). 1 each 0   Cholecalciferol 1000 UNITS tablet Take 1,000 Units by mouth daily.     docusate sodium (COLACE) 100 MG capsule Take 1 capsule (100 mg total) by mouth 2 (two) times daily. 60 capsule 11   doxazosin (CARDURA) 2 MG tablet TAKE 1 TABLET BY MOUTH EVERY DAY 90 tablet 3   fluconazole (DIFLUCAN) 100 MG tablet Take 2 tabs on day#1, then 1 tab daily on Days #2-10 11 tablet 1   glucose blood (ONETOUCH VERIO) test strip Use to check blood sugars twice a day Dx E11.9 Yearly physical w/labs are due must see MD for refills 100  each 0   metFORMIN (GLUCOPHAGE) 1000 MG tablet TAKE 1 TABLET (1,000 MG TOTAL) BY MOUTH 2 (TWO) TIMES DAILY WITH A MEAL. 180 tablet 3   pantoprazole (PROTONIX) 40 MG tablet TAKE 1 TABLET BY MOUTH EVERY DAY 90 tablet 1   Psyllium (METAMUCIL PO) Take by mouth 2 (two) times a day.     sildenafil (VIAGRA) 100 MG tablet Take 1 tablet (100 mg total) by mouth daily as needed. 10 tablet 11   TRADJENTA 5 MG TABS tablet TAKE 1 TABLET BY MOUTH EVERY DAY 30 tablet 11   triamcinolone (KENALOG) 0.1 % APPLY TO AFFECTED AREA TWICE A DAY 80 g 3   No current facility-administered medications on file prior to visit.    Allergies  Allergen Reactions   Accuretic [Quinapril-Hydrochlorothiazide]     Drug rash ?    Family History  Problem Relation Age of Onset   Cancer Mother 23       leukemia   Hypertension Other    Diabetes Maternal Grandfather    Stroke Sister     Cancer Brother    HIV/AIDS Brother    Colon cancer Neg Hx    Esophageal cancer Neg Hx    Liver cancer Neg Hx    Rectal cancer Neg Hx    Stomach cancer Neg Hx    Colon polyps Neg Hx     BP 130/86 (BP Location: Right Arm, Patient Position: Sitting, Cuff Size: Large)    Pulse 80    Ht 5' 10"  (1.778 m)    Wt 212 lb 12.8 oz (96.5 kg)    SpO2 98%    BMI 30.53 kg/m    Review of Systems     Objective:   Physical Exam VITAL SIGNS:  See vs page GENERAL: no distress Pulses: dorsalis pedis intact bilat.   MSK: no deformity of the feet CV: no leg edema Skin:  no ulcer on the feet.  normal color and temp on the feet. Neuro: sensation is intact to touch on the feet Ext: there is bilateral onychomycosis of the toenails  A1c=6.5%      Assessment & Plan:  Type 2 DM, with PN: well-controlled.    Patient Instructions  check your blood sugar once a day.  vary the time of day when you check, between before the 3 meals, and at bedtime.  also check if you have symptoms of your blood sugar being too high or too low.  please keep a record of the readings and bring it to your next appointment here (or you can bring the meter itself).  You can write it on any piece of paper.  please call us sooner if your blood sugar goes below 70, or if you have a lot of readings over 200. Please continue the same diabetes medications.  Please come back for a follow-up appointment in 6 months.

## 2020-06-11 NOTE — Patient Instructions (Addendum)
check your blood sugar once a day.  vary the time of day when you check, between before the 3 meals, and at bedtime.  also check if you have symptoms of your blood sugar being too high or too low.  please keep a record of the readings and bring it to your next appointment here (or you can bring the meter itself).  You can write it on any piece of paper.  please call us sooner if your blood sugar goes below 70, or if you have a lot of readings over 200. Please continue the same diabetes medications.  Please come back for a follow-up appointment in 6 months.

## 2020-06-29 ENCOUNTER — Encounter: Payer: Self-pay | Admitting: Neurology

## 2020-06-29 ENCOUNTER — Other Ambulatory Visit: Payer: Self-pay

## 2020-06-29 ENCOUNTER — Ambulatory Visit: Payer: Medicare PPO | Admitting: Neurology

## 2020-06-29 VITALS — BP 126/80 | HR 73 | Ht 71.0 in | Wt 210.0 lb

## 2020-06-29 DIAGNOSIS — I639 Cerebral infarction, unspecified: Secondary | ICD-10-CM | POA: Diagnosis not present

## 2020-06-29 DIAGNOSIS — G8111 Spastic hemiplegia affecting right dominant side: Secondary | ICD-10-CM

## 2020-06-29 DIAGNOSIS — R413 Other amnesia: Secondary | ICD-10-CM | POA: Diagnosis not present

## 2020-06-29 MED ORDER — BACLOFEN 10 MG PO TABS
10.0000 mg | ORAL_TABLET | Freq: Three times a day (TID) | ORAL | 3 refills | Status: DC
Start: 2020-06-29 — End: 2021-07-01

## 2020-06-29 NOTE — Progress Notes (Signed)
Follow-up Visit   Date: 06/29/20    Christian Clements MRN: [id number] DOB: April 03, 1950   Interim History: Christian Clements is a 70 y.o. 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  The patient was accompanied to the clinic by self.  UPDATE 06/29/2020:  He is here for 1 year follow-up.  He does not have any new complaints.  There has been no significant change in the right leg spasticity.  He is complaint with baclofen 70m TID.  He has been going to the pool almost daily and able to run in the water, but running on land is not possible.  He remains very active, but admits to not stretching as much as previously.  He also still tends to be forgetful.  No issues with keeping up with his daily activities.  He has started to use lumosity.    Medications:  Current Outpatient Medications on File Prior to Visit  Medication Sig Dispense Refill  . amLODipine-valsartan (EXFORGE) 5-320 MG tablet TAKE 1 TABLET BY MOUTH EVERY DAY 90 tablet 3  . aspirin 325 MG tablet Take 1 tablet (325 mg total) by mouth daily. 100 tablet 3  . atorvastatin (LIPITOR) 20 MG tablet TAKE 1 TABLET BY MOUTH EVERY DAY 90 tablet 3  . b complex vitamins tablet Take 1 tablet by mouth daily. 100 tablet 3  . baclofen (LIORESAL) 10 MG tablet Take 1 tablet (10 mg total) by mouth 3 (three) times daily. 270 tablet 3  . blood glucose meter kit and supplies KIT Dispense based on patient and insurance preference. Use two times daily as directed. (FOR ICD-9 250.00, 250.01). 1 each 0  . Cholecalciferol 1000 UNITS tablet Take 1,000 Units by mouth daily.    .Marland Kitchendocusate sodium (COLACE) 100 MG capsule Take 1 capsule (100 mg total) by mouth 2 (two) times daily. 60 capsule 11  . doxazosin (CARDURA) 2 MG tablet TAKE 1 TABLET BY MOUTH EVERY DAY 90 tablet 3  .  fluconazole (DIFLUCAN) 100 MG tablet Take 2 tabs on day#1, then 1 tab daily on Days #2-10 11 tablet 1  . glucose blood (ONETOUCH VERIO) test strip Use to check blood sugars twice a day Dx E11.9 Yearly physical w/labs are due must see MD for refills 100 each 0  . metFORMIN (GLUCOPHAGE) 1000 MG tablet TAKE 1 TABLET (1,000 MG TOTAL) BY MOUTH 2 (TWO) TIMES DAILY WITH A MEAL. 180 tablet 3  . pantoprazole (PROTONIX) 40 MG tablet TAKE 1 TABLET BY MOUTH EVERY DAY 90 tablet 1  . Psyllium (METAMUCIL PO) Take by mouth 2 (two) times a day.    . sildenafil (VIAGRA) 100 MG tablet Take 1 tablet (100 mg total) by mouth daily as needed. 10 tablet 11  . TRADJENTA 5 MG TABS tablet TAKE 1 TABLET BY MOUTH EVERY DAY 30 tablet 11  . triamcinolone (KENALOG) 0.1 % APPLY TO AFFECTED AREA TWICE A DAY 80 g 3   No current facility-administered medications on file prior to visit.    Allergies:  Allergies  Allergen Reactions  . Accuretic [Quinapril-Hydrochlorothiazide]     Drug rash ?    Vital Signs:  BP 126/80 (BP Location: Left Arm, Patient Position: Sitting, Cuff Size: Normal)   Pulse 73   Ht 5' 11"  (1.803 m)   Wt 210 lb (95.3 kg)   SpO2 96%   BMI 29.29 kg/m  Neurological Exam: MENTAL STATUS including orientation to time, place, person, recent and remote memory, attention span and concentration, language, and fund of knowledge is normal.  Speech is not dysarthric.  MOTOR:  Motor strength is 5/5 in all extremities, except right intrinsic hand muscles are 4+/5.  No atrophy, fasciculations or abnormal movements.  No pronator drift.  Tone is 0+ in the RLE.    MSRs:  Reflexes are 2+/4 throughout, except right patella is 3+/4.  COORDINATION/GAIT:  Finger and toe tapping is slowed on the right.  Gait shows mild spasticity, stable and unassisted  Data: MRI/A brain 01/04/2016: Acute sub cm infarction at the ventral pontomedullary junction just to the left of midline. Small acute left frontal subcortical white  matter infarction. These 2 acute infarctions could be coincidental in due to ordinary small vessel disease, but emboli from the heart or ascending aorta could also result in this. Moderate chronic small-vessel ischemic changes of the cerebral hemispheric white matter for a age. Negative intracranial MR angiography of the large and medium size vessels.  Cardiac monitor 04/06/2016:  No arrythmia  Echo 01/06/2016:  Left ventricle: The cavity size was normal. Wall thickness was normal. Systolic function was normal. The estimated ejectionfraction was in the range of 55% to 60%.  No defect or patent foramen ovale was identified.  US carotids 01/05/2016:  1-39% bilateral ICA  Lab Results  Component Value Date   HGBA1C 6.5 (A) 06/11/2020   Lab Results  Component Value Date   LDLCALC 19 02/10/2020     IMPRESSION/PLAN: 1. 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  Cardioembolic source was suspected given the involvement of 2 vascular territories, however cardiac monitoring was normal.   If he has any new events, he will need implantable loop recorder  - Continue aspirin 325 mg and Lipitor 20 mg  - Blood pressure and diabetes is well controlled and followed by his PCP  3.  Cognitive changes, amnestic (mild).  He is doing mentally stimulating activities.  He remains highly independent with all IADLs and ADLs.  Formal neurocognitive testing declined.   Return to clinic 1 year  Thank you for allowing me to participate in patient's care.  If I can answer any additional questions, I would be pleased to do so.    Sincerely,    Vasily Fedewa K. PPosey Pronto DO

## 2020-06-29 NOTE — Patient Instructions (Signed)
Continue baclofen 10mg  three times daily  Start leg stretching exercsies  Return to clinic in 1 year

## 2020-07-10 DIAGNOSIS — M1712 Unilateral primary osteoarthritis, left knee: Secondary | ICD-10-CM | POA: Diagnosis not present

## 2020-07-10 DIAGNOSIS — S83242D Other tear of medial meniscus, current injury, left knee, subsequent encounter: Secondary | ICD-10-CM | POA: Diagnosis not present

## 2020-08-14 ENCOUNTER — Other Ambulatory Visit: Payer: Self-pay

## 2020-08-14 ENCOUNTER — Other Ambulatory Visit: Payer: Self-pay | Admitting: Endocrinology

## 2020-08-14 ENCOUNTER — Ambulatory Visit: Payer: Medicare PPO | Admitting: Internal Medicine

## 2020-08-14 ENCOUNTER — Other Ambulatory Visit (INDEPENDENT_AMBULATORY_CARE_PROVIDER_SITE_OTHER): Payer: Medicare PPO

## 2020-08-14 DIAGNOSIS — R972 Elevated prostate specific antigen [PSA]: Secondary | ICD-10-CM | POA: Diagnosis not present

## 2020-08-14 DIAGNOSIS — E1159 Type 2 diabetes mellitus with other circulatory complications: Secondary | ICD-10-CM

## 2020-08-14 LAB — COMPREHENSIVE METABOLIC PANEL
ALT: 21 U/L (ref 0–53)
AST: 17 U/L (ref 0–37)
Albumin: 4.9 g/dL (ref 3.5–5.2)
Alkaline Phosphatase: 63 U/L (ref 39–117)
BUN: 15 mg/dL (ref 6–23)
CO2: 22 mEq/L (ref 19–32)
Calcium: 9.8 mg/dL (ref 8.4–10.5)
Chloride: 106 mEq/L (ref 96–112)
Creatinine, Ser: 1.03 mg/dL (ref 0.40–1.50)
GFR: 73.65 mL/min (ref >60.00)
Glucose, Bld: 114 mg/dL — ABNORMAL HIGH (ref 70–99)
Potassium: 4.2 mEq/L (ref 3.5–5.1)
Sodium: 141 mEq/L (ref 135–145)
Total Bilirubin: 0.8 mg/dL (ref 0.2–1.2)
Total Protein: 7.5 g/dL (ref 6.0–8.3)

## 2020-08-14 LAB — HEMOGLOBIN A1C: Hgb A1c MFr Bld: 6.7 % — ABNORMAL HIGH (ref 4.6–6.5)

## 2020-08-14 LAB — PSA: PSA: 3.57 ng/mL (ref 0.10–4.00)

## 2020-08-23 ENCOUNTER — Ambulatory Visit: Payer: Medicare PPO | Admitting: Internal Medicine

## 2020-08-29 ENCOUNTER — Ambulatory Visit: Payer: Medicare PPO | Admitting: Internal Medicine

## 2020-08-29 ENCOUNTER — Encounter: Payer: Self-pay | Admitting: Internal Medicine

## 2020-08-29 ENCOUNTER — Other Ambulatory Visit: Payer: Self-pay

## 2020-08-29 DIAGNOSIS — R972 Elevated prostate specific antigen [PSA]: Secondary | ICD-10-CM | POA: Diagnosis not present

## 2020-08-29 DIAGNOSIS — E1159 Type 2 diabetes mellitus with other circulatory complications: Secondary | ICD-10-CM

## 2020-08-29 DIAGNOSIS — E785 Hyperlipidemia, unspecified: Secondary | ICD-10-CM

## 2020-08-29 DIAGNOSIS — E876 Hypokalemia: Secondary | ICD-10-CM | POA: Diagnosis not present

## 2020-08-29 DIAGNOSIS — E1169 Type 2 diabetes mellitus with other specified complication: Secondary | ICD-10-CM

## 2020-08-29 DIAGNOSIS — I69359 Hemiplegia and hemiparesis following cerebral infarction affecting unspecified side: Secondary | ICD-10-CM | POA: Diagnosis not present

## 2020-08-29 NOTE — Assessment & Plan Note (Signed)
On Lipitor 

## 2020-08-29 NOTE — Assessment & Plan Note (Addendum)
Cont w/Metformin, Trajenta; Lipitor  Pt refused shots, ie Prevnar 20

## 2020-08-29 NOTE — Assessment & Plan Note (Signed)
Cont w/ASA, Lipitor, Exforge  B complex, Lion's mane

## 2020-08-29 NOTE — Progress Notes (Signed)
Subjective:  Patient ID: Christian Clements, male    DOB: 01-19-1951  Age: 70 y.o. MRN: [id number]  CC: Follow-up (3 MONTH F/U)   HPI Christian Clements presents for HTN, dyslipidemia, DM f/u  Outpatient Medications Prior to Visit  Medication Sig Dispense Refill  . amLODipine-valsartan (EXFORGE) 5-320 MG tablet TAKE 1 TABLET BY MOUTH EVERY DAY 90 tablet 3  . aspirin 325 MG tablet Take 1 tablet (325 mg total) by mouth daily. 100 tablet 3  . atorvastatin (LIPITOR) 20 MG tablet TAKE 1 TABLET BY MOUTH EVERY DAY 90 tablet 3  . b complex vitamins tablet Take 1 tablet by mouth daily. 100 tablet 3  . baclofen (LIORESAL) 10 MG tablet Take 1 tablet (10 mg total) by mouth 3 (three) times daily. 270 tablet 3  . blood glucose meter kit and supplies KIT Dispense based on patient and insurance preference. Use two times daily as directed. (FOR ICD-9 250.00, 250.01). 1 each 0  . Cholecalciferol 1000 UNITS tablet Take 1,000 Units by mouth daily.    Marland Kitchen docusate sodium (COLACE) 100 MG capsule Take 1 capsule (100 mg total) by mouth 2 (two) times daily. 60 capsule 11  . doxazosin (CARDURA) 2 MG tablet TAKE 1 TABLET BY MOUTH EVERY DAY 90 tablet 3  . glucose blood (ONETOUCH VERIO) test strip Use to check blood sugars twice a day Dx E11.9 Yearly physical w/labs are due must see MD for refills 100 each 0  . metFORMIN (GLUCOPHAGE) 1000 MG tablet TAKE 1 TABLET (1,000 MG TOTAL) BY MOUTH 2 (TWO) TIMES DAILY WITH A MEAL. 180 tablet 3  . pantoprazole (PROTONIX) 40 MG tablet TAKE 1 TABLET BY MOUTH EVERY DAY 90 tablet 1  . Psyllium (METAMUCIL PO) Take by mouth 2 (two) times a day.    . sildenafil (VIAGRA) 100 MG tablet Take 1 tablet (100 mg total) by mouth daily as needed. 10 tablet 11  . TRADJENTA 5 MG TABS tablet TAKE 1 TABLET BY MOUTH EVERY DAY 30 tablet 11  . triamcinolone (KENALOG) 0.1 % APPLY TO AFFECTED AREA TWICE A DAY 80 g 3  . fluconazole (DIFLUCAN) 100 MG tablet Take 2 tabs on day#1, then 1 tab daily on Days #2-10  (Patient not taking: Reported on 08/29/2020) 11 tablet 1   No facility-administered medications prior to visit.    ROS: Review of Systems  Constitutional: Negative for appetite change, fatigue and unexpected weight change.  HENT: Negative for congestion, nosebleeds, sneezing, sore throat and trouble swallowing.   Eyes: Negative for itching and visual disturbance.  Respiratory: Negative for cough.   Cardiovascular: Negative for chest pain, palpitations and leg swelling.  Gastrointestinal: Negative for abdominal distention, blood in stool, diarrhea and nausea.  Genitourinary: Negative for frequency and hematuria.  Musculoskeletal: Negative for back pain, gait problem, joint swelling and neck pain.  Skin: Negative for rash.  Neurological: Positive for weakness. Negative for dizziness, tremors and speech difficulty.  Psychiatric/Behavioral: Negative for agitation, dysphoric mood, sleep disturbance and suicidal ideas. The patient is not nervous/anxious.     Objective:  BP 140/82 (BP Location: Left Arm)   Pulse 70   Temp 98.1 F (36.7 C) (Oral)   Ht $R'5\' 11"'Zs$  (1.803 m)   Wt 209 lb (94.8 kg)   SpO2 97%   BMI 29.15 kg/m   BP Readings from Last 3 Encounters:  08/29/20 140/82  06/29/20 126/80  06/11/20 130/86    Wt Readings from Last 3 Encounters:  08/29/20 209 lb (94.8 kg)  06/29/20  210 lb (95.3 kg)  06/11/20 212 lb 12.8 oz (96.5 kg)    Physical Exam Constitutional:      General: He is not in acute distress.    Appearance: He is well-developed.     Comments: NAD  Eyes:     Conjunctiva/sclera: Conjunctivae normal.     Pupils: Pupils are equal, round, and reactive to light.  Neck:     Thyroid: No thyromegaly.     Vascular: No JVD.  Cardiovascular:     Rate and Rhythm: Normal rate and regular rhythm.     Heart sounds: Normal heart sounds. No murmur heard. No friction rub. No gallop.   Pulmonary:     Effort: Pulmonary effort is normal. No respiratory distress.     Breath  sounds: Normal breath sounds. No wheezing or rales.  Chest:     Chest wall: No tenderness.  Abdominal:     General: Bowel sounds are normal. There is no distension.     Palpations: Abdomen is soft. There is no mass.     Tenderness: There is no abdominal tenderness. There is no guarding or rebound.  Musculoskeletal:        General: No tenderness. Normal range of motion.     Cervical back: Normal range of motion.  Lymphadenopathy:     Cervical: No cervical adenopathy.  Skin:    General: Skin is warm and dry.     Findings: No rash.  Neurological:     Mental Status: He is alert and oriented to person, place, and time.     Cranial Nerves: No cranial nerve deficit.     Motor: Weakness present. No abnormal muscle tone.     Coordination: Coordination abnormal.     Gait: Gait abnormal.     Deep Tendon Reflexes: Reflexes are normal and symmetric.  Psychiatric:        Behavior: Behavior normal.        Thought Content: Thought content normal.        Judgment: Judgment normal.   R side weakness  Lab Results  Component Value Date   WBC 7.2 02/10/2020   HGB 14.6 02/10/2020   HCT 42.8 02/10/2020   PLT 289.0 02/10/2020   GLUCOSE 114 (H) 08/14/2020   CHOL 71 02/10/2020   TRIG 67.0 02/10/2020   HDL 38.50 (L) 02/10/2020   LDLDIRECT 30.1 11/14/2010   LDLCALC 19 02/10/2020   ALT 21 08/14/2020   AST 17 08/14/2020   NA 141 08/14/2020   K 4.2 08/14/2020   CL 106 08/14/2020   CREATININE 1.03 08/14/2020   BUN 15 08/14/2020   CO2 22 08/14/2020   TSH 3.27 02/10/2020   PSA 3.57 08/14/2020   INR 1.2 (H) 05/15/2016   HGBA1C 6.7 (H) 08/14/2020   MICROALBUR 4.0 (H) 02/11/2019    DG HIP UNILAT WITH PELVIS 2-3 VIEWS RIGHT  Result Date: 11/27/2016 CLINICAL DATA:  Right hip pain EXAM: DG HIP (WITH OR WITHOUT PELVIS) 2-3V RIGHT COMPARISON:  10/27/2016 FINDINGS: There is no evidence of hip fracture or dislocation. There is no evidence of arthropathy or other focal bone abnormality. IMPRESSION:  Negative. Electronically Signed   By: Franchot Gallo M.D.   On: 11/27/2016 15:58    Assessment & Plan:    Walker Kehr, MD

## 2020-08-29 NOTE — Assessment & Plan Note (Signed)
Check CMET. 

## 2020-08-29 NOTE — Assessment & Plan Note (Signed)
Monitor PSA 

## 2020-09-26 DIAGNOSIS — H353111 Nonexudative age-related macular degeneration, right eye, early dry stage: Secondary | ICD-10-CM | POA: Diagnosis not present

## 2020-12-12 ENCOUNTER — Other Ambulatory Visit: Payer: Self-pay

## 2020-12-12 ENCOUNTER — Ambulatory Visit: Payer: Medicare PPO | Admitting: Endocrinology

## 2020-12-12 VITALS — BP 150/84 | HR 66 | Ht 71.0 in | Wt 214.2 lb

## 2020-12-12 DIAGNOSIS — E1159 Type 2 diabetes mellitus with other circulatory complications: Secondary | ICD-10-CM | POA: Diagnosis not present

## 2020-12-12 LAB — POCT GLYCOSYLATED HEMOGLOBIN (HGB A1C): Hemoglobin A1C: 6.4 % — AB (ref 4.0–5.6)

## 2020-12-12 NOTE — Patient Instructions (Addendum)
check your blood sugar once a day.  vary the time of day when you check, between before the 3 meals, and at bedtime.  also check if you have symptoms of your blood sugar being too high or too low.  please keep a record of the readings and bring it to your next appointment here (or you can bring the meter itself).  You can write it on any piece of paper.  please call us sooner if your blood sugar goes below 70, or if you have a lot of readings over 200.   Please continue the same 2 diabetes medications.  Please come back for a follow-up appointment in 6 months.

## 2020-12-12 NOTE — Progress Notes (Signed)
Subjective:    Patient ID: Christian Clements, male    DOB: 10-06-1950, 70 y.o.   MRN: [id number]  HPI Pt returns for f/u of diabetes mellitus:  DM type: 2 Dx'ed: 1443 Complications: CVA and PN Therapy: 2 oral meds.   DKA: never.   Severe hypoglycemia: never.  Pancreatitis: never.  Other: he took insulin for 1 month, in 2017.   Interval history: he says cbg's are well-controlled.  pt states he feels well in general.   He takes meds as rx'ed.   Past Medical History:  Diagnosis Date   Diabetes mellitus    Hypertension    Prostatitis 2011   Stroke [facility])     Past Surgical History:  Procedure Laterality Date   COLONOSCOPY     None      Social History   Socioeconomic History   Marital status: Married    Spouse name: Not on file   Number of children: 0   Years of education: 16   Highest education level: Bachelor's degree (e.g., BA, AB, BS)  Occupational History   Occupation: PE teacher    Employer: [employer]  Tobacco Use   Smoking status: Former   Smokeless tobacco: Never  Scientific laboratory technician Use: Never used  Substance and Sexual Activity   Alcohol use: Yes    Alcohol/week: 3.0 standard drinks    Types: 3 Glasses of wine per week    Comment: Socially   Drug use: No   Sexual activity: Yes  Other Topics Concern   Not on file  Social History Narrative   Regular exercise- yes.  Lives with wife in a 2 story home.  Has no children.     Retired Careers information officer.     Education: college.       Social Determinants of Health   Financial Resource Strain: Not on file  Food Insecurity: Not on file  Transportation Needs: Not on file  Physical Activity: Not on file  Stress: Not on file  Social Connections: Not on file  Intimate Partner Violence: Not on file    Current Outpatient Medications on File Prior to Visit  Medication Sig Dispense Refill   amLODipine-valsartan (EXFORGE) 5-320 MG tablet TAKE 1 TABLET BY MOUTH EVERY DAY 90 tablet 3   aspirin 325 MG  tablet Take 1 tablet (325 mg total) by mouth daily. 100 tablet 3   atorvastatin (LIPITOR) 20 MG tablet TAKE 1 TABLET BY MOUTH EVERY DAY 90 tablet 3   b complex vitamins tablet Take 1 tablet by mouth daily. 100 tablet 3   baclofen (LIORESAL) 10 MG tablet Take 1 tablet (10 mg total) by mouth 3 (three) times daily. 270 tablet 3   blood glucose meter kit and supplies KIT Dispense based on patient and insurance preference. Use two times daily as directed. (FOR ICD-9 250.00, 250.01). 1 each 0   Cholecalciferol 1000 UNITS tablet Take 1,000 Units by mouth daily.     docusate sodium (COLACE) 100 MG capsule Take 1 capsule (100 mg total) by mouth 2 (two) times daily. 60 capsule 11   doxazosin (CARDURA) 2 MG tablet TAKE 1 TABLET BY MOUTH EVERY DAY 90 tablet 3   glucose blood (ONETOUCH VERIO) test strip Use to check blood sugars twice a day Dx E11.9 Yearly physical w/labs are due must see MD for refills 100 each 0   metFORMIN (GLUCOPHAGE) 1000 MG tablet TAKE 1 TABLET (1,000 MG TOTAL) BY MOUTH 2 (TWO) TIMES DAILY WITH A MEAL.  180 tablet 3   pantoprazole (PROTONIX) 40 MG tablet TAKE 1 TABLET BY MOUTH EVERY DAY 90 tablet 1   Psyllium (METAMUCIL PO) Take by mouth 2 (two) times a day.     sildenafil (VIAGRA) 100 MG tablet Take 1 tablet (100 mg total) by mouth daily as needed. 10 tablet 11   TRADJENTA 5 MG TABS tablet TAKE 1 TABLET BY MOUTH EVERY DAY 30 tablet 11   triamcinolone (KENALOG) 0.1 % APPLY TO AFFECTED AREA TWICE A DAY 80 g 3   No current facility-administered medications on file prior to visit.    Allergies  Allergen Reactions   Accuretic [Quinapril-Hydrochlorothiazide]     Drug rash ?    Family History  Problem Relation Age of Onset   Cancer Mother 66       leukemia   Hypertension Other    Diabetes Maternal Grandfather    Stroke Sister    Cancer Brother    HIV/AIDS Brother    Colon cancer Neg Hx    Esophageal cancer Neg Hx    Liver cancer Neg Hx    Rectal cancer Neg Hx    Stomach  cancer Neg Hx    Colon polyps Neg Hx     BP (!) 150/84 (BP Location: Right Arm, Patient Position: Sitting, Cuff Size: Normal)   Pulse 66   Ht _0  (1.803 m)   Wt 214 lb 3.2 oz (97.2 kg)   SpO2 97%   BMI 29.87 kg/m    Review of Systems     Objective:   Physical Exam Pulses: dorsalis pedis intact bilat.   MSK: no deformity of the feet CV: no leg edema Skin:  no ulcer on the feet.  normal color and temp on the feet.   Neuro: sensation is intact to touch on the feet.    Lab Results  Component Value Date   CREATININE 1.03 08/14/2020   BUN 15 08/14/2020   NA 141 08/14/2020   K 4.2 08/14/2020   CL 106 08/14/2020   CO2 22 08/14/2020   Lab Results  Component Value Date   TSH 3.27 02/10/2020   Lab Results  Component Value Date   HGBA1C 6.4 (A) 12/12/2020      Assessment & Plan:  Type 2 DM: well-controlled  Patient Instructions  check your blood sugar once a day.  vary the time of day when you check, between before the 3 meals, and at bedtime.  also check if you have symptoms of your blood sugar being too high or too low.  please keep a record of the readings and bring it to your next appointment here (or you can bring the meter itself).  You can write it on any piece of paper.  please call us sooner if your blood sugar goes below 70, or if you have a lot of readings over 200.   Please continue the same 2 diabetes medications.  Please come back for a follow-up appointment in 6 months.

## 2020-12-31 ENCOUNTER — Other Ambulatory Visit: Payer: Self-pay

## 2020-12-31 ENCOUNTER — Ambulatory Visit: Payer: Medicare PPO | Admitting: Neurology

## 2020-12-31 ENCOUNTER — Encounter: Payer: Self-pay | Admitting: Neurology

## 2020-12-31 VITALS — BP 138/91 | HR 75 | Ht 71.0 in | Wt 214.0 lb

## 2020-12-31 DIAGNOSIS — I639 Cerebral infarction, unspecified: Secondary | ICD-10-CM | POA: Diagnosis not present

## 2020-12-31 DIAGNOSIS — G8111 Spastic hemiplegia affecting right dominant side: Secondary | ICD-10-CM | POA: Diagnosis not present

## 2020-12-31 NOTE — Progress Notes (Signed)
Follow-up Visit   Date: 12/31/20    Araceli Coufal MRN: [id number] DOB: March 03, 1951   Interim History: Jessy Cybulski is a 70 y.o. 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  The patient was accompanied to the clinic by self.  UPDATE 06/29/2020:  He is here for 1 year follow-up.  He does not have any new complaints.  There has been no significant change in the right leg spasticity.  He is complaint with baclofen 63m TID.  He has been going to the pool almost daily and able to run in the water, but running on land is not possible.  He remains very active, but admits to not stretching as much as previously.  He also still tends to be forgetful.  No issues with keeping up with his daily activities.  He has started to use lumosity.    UPDATE 12/31/2020:  He is here for follow-up visit.  No new neurologocal complaints.  He has been much more compliant with daily stretching and feels this has helped his left leg tightness the most.  He remains on baclofen 170mthree times daily.  He goes to the gym 5-6 times per week. No interval stroke or illnesses.   Medications:  Current Outpatient Medications on File Prior to Visit  Medication Sig Dispense Refill   amLODipine-valsartan (EXFORGE) 5-320 MG tablet TAKE 1 TABLET BY MOUTH EVERY DAY 90 tablet 3   aspirin 325 MG tablet Take 1 tablet (325 mg total) by mouth daily. 100 tablet 3   atorvastatin (LIPITOR) 20 MG tablet TAKE 1 TABLET BY MOUTH EVERY DAY 90 tablet 3   b complex vitamins tablet Take 1 tablet by mouth daily. 100 tablet 3   baclofen (LIORESAL) 10 MG tablet Take 1 tablet (10 mg total) by mouth 3 (three) times daily. 270 tablet 3   blood glucose meter kit and supplies KIT Dispense based on patient and insurance preference. Use two times daily as  directed. (FOR ICD-9 250.00, 250.01). 1 each 0   Cholecalciferol 1000 UNITS tablet Take 1,000 Units by mouth daily.     docusate sodium (COLACE) 100 MG capsule Take 1 capsule (100 mg total) by mouth 2 (two) times daily. 60 capsule 11   doxazosin (CARDURA) 2 MG tablet TAKE 1 TABLET BY MOUTH EVERY DAY 90 tablet 3   glucose blood (ONETOUCH VERIO) test strip Use to check blood sugars twice a day Dx E11.9 Yearly physical w/labs are due must see MD for refills 100 each 0   metFORMIN (GLUCOPHAGE) 1000 MG tablet TAKE 1 TABLET (1,000 MG TOTAL) BY MOUTH 2 (TWO) TIMES DAILY WITH A MEAL. 180 tablet 3   pantoprazole (PROTONIX) 40 MG tablet TAKE 1 TABLET BY MOUTH EVERY DAY 90 tablet 1   Psyllium (METAMUCIL PO) Take by mouth 2 (two) times a day.     sildenafil (VIAGRA) 100 MG tablet Take 1 tablet (100 mg total) by mouth daily as needed. 10 tablet 11   TRADJENTA 5 MG TABS tablet TAKE 1 TABLET BY MOUTH EVERY DAY 30 tablet 11   triamcinolone (KENALOG) 0.1 % APPLY TO AFFECTED AREA TWICE A DAY 80 g 3   No current facility-administered medications on file prior to visit.    Allergies:  Allergies  Allergen Reactions   Accuretic [Quinapril-Hydrochlorothiazide]     Drug rash ?    Vital Signs:  There were no vitals taken for this visit.   Neurological Exam: MENTAL STATUS including orientation to time, place, person, recent and remote memory, attention span and concentration, language, and fund of knowledge is normal.  Speech is not dysarthric.  MOTOR:  Motor strength is 5/5 in all extremities, except right intrinsic hand muscles are 4+/5.  No atrophy, fasciculations or abnormal movements.  No pronator drift.  Tone is 0+ in the RLE.    MSRs:  Reflexes are 2+/4 throughout, except right patella is 3+/4.  COORDINATION/GAIT:  Finger and toe tapping is slowed on the right.  Gait shows trace spasticity, stable and unassisted  Data: MRI/A brain 01/04/2016: Acute sub cm infarction at the ventral pontomedullary  junction just to the left of midline. Small acute left frontal subcortical white matter infarction. These 2 acute infarctions could be coincidental in due to ordinary small vessel disease, but emboli from the heart or ascending aorta could also result in this. Moderate chronic small-vessel ischemic changes of the cerebral hemispheric white matter for a age. Negative intracranial MR angiography of the large and medium size vessels.   Cardiac monitor 04/06/2016:  No arrythmia   Echo 01/06/2016:  Left ventricle: The cavity size was normal. Wall thickness was   normal. Systolic function was normal. The estimated ejection fraction was in the range of 55% to 60%.  No defect or patent foramen ovale was identified.   US carotids 01/05/2016:  1-39% bilateral ICA   Lab Results  Component Value Date   HGBA1C 6.4 (A) 12/12/2020   Lab Results  Component Value Date   LDLCALC 19 02/10/2020     IMPRESSION/PLAN: 1. 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  Cardioembolic source was suspected given the involvement of 2 vascular territories, however cardiac monitoring was normal.   If he has any new events, he will need implantable loop recorder  - Continue aspirin 325 mg and Lipitor 20 mg  - Blood pressure and diabetes is well controlled and followed by his PCP  3.  Cognitive changes, amnestic (mild).  He remains highly independent with all IADLs and ADLs.    Return to clinic in 6 month  Total time spent reviewing records, interview, history/exam, documentation, and coordination of care on day of encounter:  20 min   Thank you for allowing me to participate in patient's care.  If I can answer any additional questions, I would  be pleased to do so.    Sincerely,    Tracer Gutridge K. PPosey Pronto DO

## 2020-12-31 NOTE — Patient Instructions (Signed)
Return to clinic in 6 months.

## 2021-01-02 ENCOUNTER — Ambulatory Visit: Payer: Medicare PPO | Admitting: Internal Medicine

## 2021-01-02 ENCOUNTER — Other Ambulatory Visit: Payer: Self-pay

## 2021-01-02 ENCOUNTER — Encounter: Payer: Self-pay | Admitting: Internal Medicine

## 2021-01-02 VITALS — BP 132/72 | HR 58 | Temp 98.6°F | Ht 71.0 in | Wt 214.0 lb

## 2021-01-02 DIAGNOSIS — R972 Elevated prostate specific antigen [PSA]: Secondary | ICD-10-CM | POA: Diagnosis not present

## 2021-01-02 DIAGNOSIS — R21 Rash and other nonspecific skin eruption: Secondary | ICD-10-CM

## 2021-01-02 DIAGNOSIS — R140 Abdominal distension (gaseous): Secondary | ICD-10-CM | POA: Diagnosis not present

## 2021-01-02 DIAGNOSIS — E1169 Type 2 diabetes mellitus with other specified complication: Secondary | ICD-10-CM | POA: Diagnosis not present

## 2021-01-02 DIAGNOSIS — I69359 Hemiplegia and hemiparesis following cerebral infarction affecting unspecified side: Secondary | ICD-10-CM | POA: Diagnosis not present

## 2021-01-02 DIAGNOSIS — G8191 Hemiplegia, unspecified affecting right dominant side: Secondary | ICD-10-CM | POA: Diagnosis not present

## 2021-01-02 DIAGNOSIS — E1159 Type 2 diabetes mellitus with other circulatory complications: Secondary | ICD-10-CM

## 2021-01-02 DIAGNOSIS — E785 Hyperlipidemia, unspecified: Secondary | ICD-10-CM | POA: Diagnosis not present

## 2021-01-02 DIAGNOSIS — I152 Hypertension secondary to endocrine disorders: Secondary | ICD-10-CM

## 2021-01-02 DIAGNOSIS — Z23 Encounter for immunization: Secondary | ICD-10-CM

## 2021-01-02 MED ORDER — ATORVASTATIN CALCIUM 20 MG PO TABS: 20.0000 mg | ORAL_TABLET | Freq: Every day | ORAL | 3 refills | Status: DC

## 2021-01-02 NOTE — Assessment & Plan Note (Signed)
Cont on Lipitor 

## 2021-01-02 NOTE — Assessment & Plan Note (Signed)
New - ?diabetic gastroparesis Eat smaller meals GI ref offered Hold Lipitor, Metformin x 2 d

## 2021-01-02 NOTE — Progress Notes (Signed)
Subjective:  Patient ID: Christian Clements, male    DOB: 24-Jun-1950  Age: 70 y.o. MRN: [id number]  CC: Follow-up (4 month f/u)   HPI Kashif Pooler presents for rash, h/o CVS, rash on back, hands C/o food would not digest right - bloating after meals  Outpatient Medications Prior to Visit  Medication Sig Dispense Refill   amLODipine-valsartan (EXFORGE) 5-320 MG tablet TAKE 1 TABLET BY MOUTH EVERY DAY 90 tablet 3   aspirin 325 MG tablet Take 1 tablet (325 mg total) by mouth daily. 100 tablet 3   atorvastatin (LIPITOR) 20 MG tablet TAKE 1 TABLET BY MOUTH EVERY DAY 90 tablet 3   b complex vitamins tablet Take 1 tablet by mouth daily. 100 tablet 3   baclofen (LIORESAL) 10 MG tablet Take 1 tablet (10 mg total) by mouth 3 (three) times daily. 270 tablet 3   blood glucose meter kit and supplies KIT Dispense based on patient and insurance preference. Use two times daily as directed. (FOR ICD-9 250.00, 250.01). 1 each 0   Cholecalciferol 1000 UNITS tablet Take 1,000 Units by mouth daily.     docusate sodium (COLACE) 100 MG capsule Take 1 capsule (100 mg total) by mouth 2 (two) times daily. 60 capsule 11   doxazosin (CARDURA) 2 MG tablet TAKE 1 TABLET BY MOUTH EVERY DAY 90 tablet 3   glucose blood (ONETOUCH VERIO) test strip Use to check blood sugars twice a day Dx E11.9 Yearly physical w/labs are due must see MD for refills 100 each 0   metFORMIN (GLUCOPHAGE) 1000 MG tablet TAKE 1 TABLET (1,000 MG TOTAL) BY MOUTH 2 (TWO) TIMES DAILY WITH A MEAL. 180 tablet 3   pantoprazole (PROTONIX) 40 MG tablet TAKE 1 TABLET BY MOUTH EVERY DAY 90 tablet 1   Psyllium (METAMUCIL PO) Take by mouth 2 (two) times a day.     sildenafil (VIAGRA) 100 MG tablet Take 1 tablet (100 mg total) by mouth daily as needed. 10 tablet 11   TRADJENTA 5 MG TABS tablet TAKE 1 TABLET BY MOUTH EVERY DAY 30 tablet 11   triamcinolone (KENALOG) 0.1 % APPLY TO AFFECTED AREA TWICE A DAY 80 g 3   No facility-administered medications prior  to visit.    ROS: Review of Systems  Constitutional:  Negative for appetite change, fatigue and unexpected weight change.  HENT:  Negative for congestion, nosebleeds, sneezing, sore throat and trouble swallowing.   Eyes:  Negative for itching and visual disturbance.  Respiratory:  Negative for cough.   Cardiovascular:  Negative for chest pain, palpitations and leg swelling.  Gastrointestinal:  Negative for abdominal distention, blood in stool, diarrhea and nausea.  Genitourinary:  Negative for frequency and hematuria.  Musculoskeletal:  Negative for back pain, gait problem, joint swelling and neck pain.  Skin:  Negative for rash.  Neurological:  Negative for dizziness, tremors, speech difficulty and weakness.  Psychiatric/Behavioral:  Negative for agitation, dysphoric mood and sleep disturbance. The patient is not nervous/anxious.   Bloating  Objective:  BP 132/72 (BP Location: Left Arm)   Pulse (!) 58   Temp 98.6 F (37 C) (Oral)   Ht _0  (1.803 m)   Wt 214 lb (97.1 kg)   SpO2 97%   BMI 29.85 kg/m   BP Readings from Last 3 Encounters:  01/02/21 132/72  12/31/20 (!) 138/91  12/12/20 (!) 150/84    Wt Readings from Last 3 Encounters:  01/02/21 214 lb (97.1 kg)  12/31/20 214 lb (97.1 kg)  12/12/20 214 lb 3.2 oz (97.2 kg)    Physical Exam Constitutional:      General: He is not in acute distress.    Appearance: He is well-developed.     Comments: NAD  Eyes:     Conjunctiva/sclera: Conjunctivae normal.     Pupils: Pupils are equal, round, and reactive to light.  Neck:     Thyroid: No thyromegaly.     Vascular: No JVD.  Cardiovascular:     Rate and Rhythm: Normal rate and regular rhythm.     Heart sounds: Normal heart sounds. No murmur heard.   No friction rub. No gallop.  Pulmonary:     Effort: Pulmonary effort is normal. No respiratory distress.     Breath sounds: Normal breath sounds. No wheezing or rales.  Chest:     Chest wall: No tenderness.  Abdominal:      General: Bowel sounds are normal. There is no distension.     Palpations: Abdomen is soft. There is no mass.     Tenderness: There is no abdominal tenderness. There is no guarding or rebound.  Musculoskeletal:        General: No tenderness. Normal range of motion.     Cervical back: Normal range of motion.  Lymphadenopathy:     Cervical: No cervical adenopathy.  Skin:    General: Skin is warm and dry.     Findings: No rash.  Neurological:     Mental Status: He is alert and oriented to person, place, and time.     Cranial Nerves: No cranial nerve deficit.     Motor: No abnormal muscle tone.     Coordination: Coordination normal.     Gait: Gait normal.     Deep Tendon Reflexes: Reflexes are normal and symmetric.  Psychiatric:        Behavior: Behavior normal.        Thought Content: Thought content normal.        Judgment: Judgment normal.  Rash on hands Hyperpigmentation - L back Hemiparesis R  Lab Results  Component Value Date   WBC 7.2 02/10/2020   HGB 14.6 02/10/2020   HCT 42.8 02/10/2020   PLT 289.0 02/10/2020   GLUCOSE 114 (H) 08/14/2020   CHOL 71 02/10/2020   TRIG 67.0 02/10/2020   HDL 38.50 (L) 02/10/2020   LDLDIRECT 30.1 11/14/2010   LDLCALC 19 02/10/2020   ALT 21 08/14/2020   AST 17 08/14/2020   NA 141 08/14/2020   K 4.2 08/14/2020   CL 106 08/14/2020   CREATININE 1.03 08/14/2020   BUN 15 08/14/2020   CO2 22 08/14/2020   TSH 3.27 02/10/2020   PSA 3.57 08/14/2020   INR 1.2 (H) 05/15/2016   HGBA1C 6.4 (A) 12/12/2020   MICROALBUR 4.0 (H) 02/11/2019    DG HIP UNILAT WITH PELVIS 2-3 VIEWS RIGHT  Result Date: 11/27/2016 CLINICAL DATA:  Right hip pain EXAM: DG HIP (WITH OR WITHOUT PELVIS) 2-3V RIGHT COMPARISON:  10/27/2016 FINDINGS: There is no evidence of hip fracture or dislocation. There is no evidence of arthropathy or other focal bone abnormality. IMPRESSION: Negative. Electronically Signed   By: Franchot Gallo M.D.   On: 11/27/2016 15:58     Assessment & Plan:   Problem List Items Addressed This Visit     Dyslipidemia associated with type 2 diabetes mellitus ([place]) (Chronic)    Cont on Lipitor      Relevant Orders   Lipid panel   Hemoglobin A1c   Bloating  New - ?diabetic gastroparesis Eat smaller meals GI ref offered Hold Lipitor, Metformin x 2 d      CVA, old, hemiparesis ([place])    Cont on ASA, Lipitor, Exforge      Relevant Orders   Comprehensive metabolic panel   Lipid panel   TSH   Hemoglobin A1c   Elevated PSA    Monitor PSA q 6 months      Relevant Orders   PSA   Hypertension associated with diabetes ([place])    Cont on Exforge, Cardura      Rash    Not better - fixed rash on R back, B hand eczema. Derm ref Kenalog Rx      Relevant Orders   Ambulatory referral to Dermatology   Right hemiparesis ([place])    Exercising a lot - gym 5-6 d/wk      Type 2 diabetes mellitus with vascular disease ([place]) - Primary   Relevant Orders   Hemoglobin A1c   Other Visit Diagnoses     Flu vaccine need       Relevant Orders   Flu Vaccine QUAD High Dose([facility])         Follow-up: Return in about 4 months (around 05/05/2021) for a follow-up visit.  Walker Kehr, MD

## 2021-01-02 NOTE — Assessment & Plan Note (Signed)
Cont on ASA, Lipitor, Exforge

## 2021-01-02 NOTE — Assessment & Plan Note (Signed)
Exercising a lot - gym 5-6 d/wk

## 2021-01-02 NOTE — Assessment & Plan Note (Signed)
Monitor PSA q 6 months

## 2021-01-02 NOTE — Assessment & Plan Note (Signed)
Not better - fixed rash on R back, B hand eczema. Derm ref Kenalog Rx

## 2021-01-02 NOTE — Assessment & Plan Note (Signed)
Cont on Exforge, Cardura 

## 2021-01-07 DIAGNOSIS — B354 Tinea corporis: Secondary | ICD-10-CM | POA: Diagnosis not present

## 2021-01-07 DIAGNOSIS — Z23 Encounter for immunization: Secondary | ICD-10-CM | POA: Diagnosis not present

## 2021-01-22 ENCOUNTER — Other Ambulatory Visit: Payer: Self-pay | Admitting: Internal Medicine

## 2021-02-04 DIAGNOSIS — Z23 Encounter for immunization: Secondary | ICD-10-CM | POA: Diagnosis not present

## 2021-02-04 DIAGNOSIS — L300 Nummular dermatitis: Secondary | ICD-10-CM | POA: Diagnosis not present

## 2021-03-07 ENCOUNTER — Other Ambulatory Visit: Payer: Self-pay | Admitting: Internal Medicine

## 2021-03-26 DIAGNOSIS — M25511 Pain in right shoulder: Secondary | ICD-10-CM | POA: Diagnosis not present

## 2021-04-04 DIAGNOSIS — M25511 Pain in right shoulder: Secondary | ICD-10-CM | POA: Insufficient documentation

## 2021-05-17 ENCOUNTER — Other Ambulatory Visit: Payer: Self-pay | Admitting: Endocrinology

## 2021-06-11 ENCOUNTER — Other Ambulatory Visit: Payer: Self-pay | Admitting: Endocrinology

## 2021-06-11 ENCOUNTER — Ambulatory Visit: Payer: Medicare PPO | Admitting: Endocrinology

## 2021-06-11 ENCOUNTER — Other Ambulatory Visit: Payer: Self-pay

## 2021-06-11 ENCOUNTER — Encounter: Payer: Self-pay | Admitting: Endocrinology

## 2021-06-11 VITALS — BP 150/98 | HR 78 | Ht 71.0 in | Wt 215.6 lb

## 2021-06-11 DIAGNOSIS — E1159 Type 2 diabetes mellitus with other circulatory complications: Secondary | ICD-10-CM | POA: Diagnosis not present

## 2021-06-11 LAB — POCT GLYCOSYLATED HEMOGLOBIN (HGB A1C): Hemoglobin A1C: 7.5 % — AB (ref 4.0–5.6)

## 2021-06-11 MED ORDER — METFORMIN HCL ER 500 MG PO TB24: 2000.0000 mg | ORAL_TABLET | Freq: Every day | ORAL | 3 refills | Status: DC

## 2021-06-11 MED ORDER — RYBELSUS 7 MG PO TABS: 7.0000 mg | ORAL_TABLET | Freq: Every day | ORAL | 3 refills | Status: DC

## 2021-06-11 NOTE — Patient Instructions (Addendum)
check your blood sugar once a day.  vary the time of day when you check, between before the 3 meals, and at bedtime.  also check if you have symptoms of your blood sugar being too high or too low.  please keep a record of the readings and bring it to your next appointment here (or you can bring the meter itself).  You can write it on any piece of paper.  please call us sooner if your blood sugar goes below 70, or if you have a lot of readings over 200.   ?I have sent 2 prescriptions to your pharmacy: to change metformin to extended-release, and to change Tradjenta to Rybelsus.   ?Please come back for a follow-up appointment in 3 months.   ? ?

## 2021-06-11 NOTE — Progress Notes (Signed)
? ?Subjective:  ? ? Patient ID: Christian Clements, male    DOB: [birth year], 71 y.o.   MRN: [id number] ? ?HPI ?Pt returns for f/u of diabetes mellitus:  ?DM type: 2 ?Dx'ed: 2006 ?Complications: CVA and PN.  ?Therapy: 2 oral meds.   ?DKA: never.   ?Severe hypoglycemia: never.  ?Pancreatitis: never.  ?Other: he took insulin for 1 month, in 2017.   ?Interval history: he says cbg's are well-controlled.  pt states he feels well in general.   He takes meds as rx'ed.   ?Past Medical History:  ?Diagnosis Date  ? Diabetes mellitus   ? Hypertension   ? Prostatitis 2011  ? Stroke [facility])   ? ? ?Past Surgical History:  ?Procedure Laterality Date  ? COLONOSCOPY    ? None    ? ? ?Social History  ? ?Socioeconomic History  ? Marital status: Married  ?  Spouse name: Not on file  ? Number of children: 0  ? Years of education: 24  ? Highest education level: Bachelor's degree (e.g., BA, AB, BS)  ?Occupational History  ? Occupation: PE teacher  ?  Employer: [employer]  ?Tobacco Use  ? Smoking status: Former  ? Smokeless tobacco: Never  ?Vaping Use  ? Vaping Use: Never used  ?Substance and Sexual Activity  ? Alcohol use: Yes  ?  Alcohol/week: 3.0 standard drinks  ?  Types: 3 Glasses of wine per week  ?  Comment: Socially  ? Drug use: No  ? Sexual activity: Yes  ?Other Topics Concern  ? Not on file  ?Social History Narrative  ? Regular exercise- yes.  Lives with wife in a 2 story home.  Has no children.    ? Retired Careers information officer.    ? Education: college.   ?   ? ?Social Determinants of Health  ? ?Financial Resource Strain: Not on file  ?Food Insecurity: Not on file  ?Transportation Needs: Not on file  ?Physical Activity: Not on file  ?Stress: Not on file  ?Social Connections: Not on file  ?Intimate Partner Violence: Not on file  ? ? ?Current Outpatient Medications on File Prior to Visit  ?Medication Sig Dispense Refill  ? amLODipine-valsartan (EXFORGE) 5-320 MG tablet TAKE 1 TABLET BY MOUTH EVERY DAY 90 tablet 0  ? aspirin 325 MG  tablet Take 1 tablet (325 mg total) by mouth daily. 100 tablet 3  ? atorvastatin (LIPITOR) 20 MG tablet Take 1 tablet (20 mg total) by mouth daily. 90 tablet 3  ? b complex vitamins tablet Take 1 tablet by mouth daily. 100 tablet 3  ? baclofen (LIORESAL) 10 MG tablet Take 1 tablet (10 mg total) by mouth 3 (three) times daily. 270 tablet 3  ? blood glucose meter kit and supplies KIT Dispense based on patient and insurance preference. Use two times daily as directed. (FOR ICD-9 250.00, 250.01). 1 each 0  ? Cholecalciferol 1000 UNITS tablet Take 1,000 Units by mouth daily.    ? docusate sodium (COLACE) 100 MG capsule Take 1 capsule (100 mg total) by mouth 2 (two) times daily. 60 capsule 11  ? doxazosin (CARDURA) 2 MG tablet TAKE 1 TABLET BY MOUTH EVERY DAY 90 tablet 3  ? glucose blood (ONETOUCH VERIO) test strip Use to check blood sugars twice a day Dx E11.9 ?Yearly physical w/labs are due must see MD for refills 100 each 0  ? pantoprazole (PROTONIX) 40 MG tablet TAKE 1 TABLET BY MOUTH EVERY DAY 90 tablet 1  ?  Psyllium (METAMUCIL PO) Take by mouth 2 (two) times a day.    ? sildenafil (VIAGRA) 100 MG tablet Take 1 tablet (100 mg total) by mouth daily as needed. 10 tablet 11  ? triamcinolone cream (KENALOG) 0.1 % APPLY TO AFFECTED AREA TWICE A DAY 60 g 3  ? ?No current facility-administered medications on file prior to visit.  ? ? ?Allergies  ?Allergen Reactions  ? Accuretic [Quinapril-Hydrochlorothiazide]   ?  Drug rash ?  ? ? ?Family History  ?Problem Relation Age of Onset  ? Cancer Mother 45  ?     leukemia  ? Hypertension Other   ? Diabetes Maternal Grandfather   ? Stroke Sister   ? Cancer Brother   ? HIV/AIDS Brother   ? Colon cancer Neg Hx   ? Esophageal cancer Neg Hx   ? Liver cancer Neg Hx   ? Rectal cancer Neg Hx   ? Stomach cancer Neg Hx   ? Colon polyps Neg Hx   ? ? ?BP (!) 150/98 (BP Location: Left Arm, Patient Position: Sitting, Cuff Size: Normal)   Pulse 78   Ht $R'5\' 11"'tX$  (1.803 m)   Wt 215 lb 9.6 oz (97.8  kg)   SpO2 97%   BMI 30.07 kg/m?  ? ? ?Review of Systems ?Denies N/HB ?   ?Objective:  ? Physical Exam ?VITAL SIGNS:  See vs page.   ?GENERAL: no distress.  ? ? ?Lab Results  ?Component Value Date  ? CREATININE 1.03 08/14/2020  ? BUN 15 08/14/2020  ? NA 141 08/14/2020  ? K 4.2 08/14/2020  ? CL 106 08/14/2020  ? CO2 22 08/14/2020  ? ?A1c=7.3% ?   ?Assessment & Plan:  ?Type 2 DM: uncontrolled ? ?Patient Instructions  ?check your blood sugar once a day.  vary the time of day when you check, between before the 3 meals, and at bedtime.  also check if you have symptoms of your blood sugar being too high or too low.  please keep a record of the readings and bring it to your next appointment here (or you can bring the meter itself).  You can write it on any piece of paper.  please call us sooner if your blood sugar goes below 70, or if you have a lot of readings over 200.   ?I have sent 2 prescriptions to your pharmacy: to change metformin to extended-release, and to change Tradjenta to Rybelsus.   ?Please come back for a follow-up appointment in 3 months.   ? ? ? ?

## 2021-06-13 ENCOUNTER — Encounter: Payer: Self-pay | Admitting: Internal Medicine

## 2021-06-13 ENCOUNTER — Ambulatory Visit: Payer: Medicare PPO | Admitting: Internal Medicine

## 2021-06-13 ENCOUNTER — Other Ambulatory Visit: Payer: Self-pay

## 2021-06-13 DIAGNOSIS — R4589 Other symptoms and signs involving emotional state: Secondary | ICD-10-CM | POA: Insufficient documentation

## 2021-06-13 DIAGNOSIS — G8191 Hemiplegia, unspecified affecting right dominant side: Secondary | ICD-10-CM

## 2021-06-13 DIAGNOSIS — I69359 Hemiplegia and hemiparesis following cerebral infarction affecting unspecified side: Secondary | ICD-10-CM | POA: Diagnosis not present

## 2021-06-13 DIAGNOSIS — E1159 Type 2 diabetes mellitus with other circulatory complications: Secondary | ICD-10-CM

## 2021-06-13 DIAGNOSIS — Z7984 Long term (current) use of oral hypoglycemic drugs: Secondary | ICD-10-CM | POA: Diagnosis not present

## 2021-06-13 DIAGNOSIS — F5101 Primary insomnia: Secondary | ICD-10-CM

## 2021-06-13 DIAGNOSIS — I152 Hypertension secondary to endocrine disorders: Secondary | ICD-10-CM | POA: Diagnosis not present

## 2021-06-13 DIAGNOSIS — G4700 Insomnia, unspecified: Secondary | ICD-10-CM | POA: Insufficient documentation

## 2021-06-13 DIAGNOSIS — E1169 Type 2 diabetes mellitus with other specified complication: Secondary | ICD-10-CM | POA: Diagnosis not present

## 2021-06-13 DIAGNOSIS — E785 Hyperlipidemia, unspecified: Secondary | ICD-10-CM

## 2021-06-13 NOTE — Assessment & Plan Note (Signed)
New.  Try Tylenol PM at night.  Come back in a week or 2 if not better. ?

## 2021-06-13 NOTE — Assessment & Plan Note (Signed)
New.  Unclear etiology.  Spend more time outside.  Try Tylenol PM at night to help with sleep.  Come back in a week or 2 if not better. ?

## 2021-06-13 NOTE — Assessment & Plan Note (Signed)
Worse ?Dr Loanne Drilling is retiring ?Cont on Metformin. Rybelsus is added ?

## 2021-06-13 NOTE — Assessment & Plan Note (Signed)
Chronic  ?Cont on Exforge, Cardura ?

## 2021-06-13 NOTE — Assessment & Plan Note (Signed)
Chronic. Cont on Lipitor 

## 2021-06-13 NOTE — Progress Notes (Signed)
? ?Subjective:  ?Patient ID: Christian Clements, male    DOB: 1951-01-26  Age: 71 y.o. MRN: [id number] ? ?CC: No chief complaint on file. ? ? ?HPI ?Christian Clements presents for DM, HTN, h/o CVA ?A1c was higher - Dr Loanne Drilling changed his meds -Rybelsus 7 mg daily was added. ?Christian Clements has been feeling down for a few weeks lately for unknown reason.  Not sleeping well.  Getting 4 hours of sleep per night .  He continues to play golf and exercises regularly at the gym. ? ?Outpatient Medications Prior to Visit  ?Medication Sig Dispense Refill  ? amLODipine-valsartan (EXFORGE) 5-320 MG tablet TAKE 1 TABLET BY MOUTH EVERY DAY 90 tablet 0  ? aspirin 325 MG tablet Take 1 tablet (325 mg total) by mouth daily. 100 tablet 3  ? atorvastatin (LIPITOR) 20 MG tablet Take 1 tablet (20 mg total) by mouth daily. 90 tablet 3  ? b complex vitamins tablet Take 1 tablet by mouth daily. 100 tablet 3  ? baclofen (LIORESAL) 10 MG tablet Take 1 tablet (10 mg total) by mouth 3 (three) times daily. 270 tablet 3  ? blood glucose meter kit and supplies KIT Dispense based on patient and insurance preference. Use two times daily as directed. (FOR ICD-9 250.00, 250.01). 1 each 0  ? Cholecalciferol 1000 UNITS tablet Take 1,000 Units by mouth daily.    ? docusate sodium (COLACE) 100 MG capsule Take 1 capsule (100 mg total) by mouth 2 (two) times daily. 60 capsule 11  ? doxazosin (CARDURA) 2 MG tablet TAKE 1 TABLET BY MOUTH EVERY DAY 90 tablet 3  ? glucose blood (ONETOUCH VERIO) test strip Use to check blood sugars twice a day Dx E11.9 ?Yearly physical w/labs are due must see MD for refills 100 each 0  ? metFORMIN (GLUCOPHAGE-XR) 500 MG 24 hr tablet TAKE 4 TABLETS (2,000 MG TOTAL) BY MOUTH DAILY 360 tablet 3  ? pantoprazole (PROTONIX) 40 MG tablet TAKE 1 TABLET BY MOUTH EVERY DAY 90 tablet 1  ? Psyllium (METAMUCIL PO) Take by mouth 2 (two) times a day.    ? Semaglutide (RYBELSUS) 7 MG TABS Take 7 mg by mouth daily. 90 tablet 3  ? sildenafil (VIAGRA) 100 MG  tablet Take 1 tablet (100 mg total) by mouth daily as needed. 10 tablet 11  ? triamcinolone cream (KENALOG) 0.1 % APPLY TO AFFECTED AREA TWICE A DAY 60 g 3  ? ?No facility-administered medications prior to visit.  ? ? ?ROS: ?Review of Systems  ?Constitutional:  Negative for appetite change, fatigue and unexpected weight change.  ?HENT:  Negative for congestion, nosebleeds, sneezing, sore throat and trouble swallowing.   ?Eyes:  Negative for itching and visual disturbance.  ?Respiratory:  Negative for cough.   ?Cardiovascular:  Negative for chest pain, palpitations and leg swelling.  ?Gastrointestinal:  Negative for abdominal distention, blood in stool, diarrhea and nausea.  ?Genitourinary:  Negative for frequency and hematuria.  ?Musculoskeletal:  Negative for back pain, gait problem, joint swelling and neck pain.  ?Skin:  Negative for rash.  ?Neurological:  Negative for dizziness, tremors, speech difficulty and weakness.  ?Psychiatric/Behavioral:  Positive for dysphoric mood and sleep disturbance. Negative for agitation and suicidal ideas. The patient is not nervous/anxious.   ? ?Objective:  ?BP 132/82 (BP Location: Left Arm, Patient Position: Sitting, Cuff Size: Large)   Pulse 83   Temp 98.4 ?F (36.9 ?C) (Oral)   Ht _0  (1.803 m)   Wt 214 lb (97.1 kg)  SpO2 98%   BMI 29.85 kg/m?  ? ?BP Readings from Last 3 Encounters:  ?06/13/21 132/82  ?06/11/21 (!) 150/98  ?01/02/21 132/72  ? ? ?Wt Readings from Last 3 Encounters:  ?06/13/21 214 lb (97.1 kg)  ?06/11/21 215 lb 9.6 oz (97.8 kg)  ?01/02/21 214 lb (97.1 kg)  ? ? ?Physical Exam ?Constitutional:   ?   General: He is not in acute distress. ?   Appearance: He is well-developed.  ?   Comments: NAD  ?Eyes:  ?   Conjunctiva/sclera: Conjunctivae normal.  ?   Pupils: Pupils are equal, round, and reactive to light.  ?Neck:  ?   Thyroid: No thyromegaly.  ?   Vascular: No JVD.  ?Cardiovascular:  ?   Rate and Rhythm: Normal rate and regular rhythm.  ?   Heart sounds:  Normal heart sounds. No murmur heard. ?  No friction rub. No gallop.  ?Pulmonary:  ?   Effort: Pulmonary effort is normal. No respiratory distress.  ?   Breath sounds: Normal breath sounds. No wheezing or rales.  ?Chest:  ?   Chest wall: No tenderness.  ?Abdominal:  ?   General: Bowel sounds are normal. There is no distension.  ?   Palpations: Abdomen is soft. There is no mass.  ?   Tenderness: There is no abdominal tenderness. There is no guarding or rebound.  ?Musculoskeletal:     ?   General: No tenderness. Normal range of motion.  ?   Cervical back: Normal range of motion.  ?Lymphadenopathy:  ?   Cervical: No cervical adenopathy.  ?Skin: ?   General: Skin is warm and dry.  ?   Findings: No rash.  ?Neurological:  ?   Mental Status: He is alert and oriented to person, place, and time.  ?   Cranial Nerves: No cranial nerve deficit.  ?   Motor: No abnormal muscle tone.  ?   Coordination: Coordination normal.  ?   Gait: Gait normal.  ?   Deep Tendon Reflexes: Reflexes are normal and symmetric.  ?Psychiatric:     ?   Behavior: Behavior normal.     ?   Thought Content: Thought content normal.     ?   Judgment: Judgment normal.  ? ? ?Lab Results  ?Component Value Date  ? WBC 7.2 02/10/2020  ? HGB 14.6 02/10/2020  ? HCT 42.8 02/10/2020  ? PLT 289.0 02/10/2020  ? GLUCOSE 114 (H) 08/14/2020  ? CHOL 71 02/10/2020  ? TRIG 67.0 02/10/2020  ? HDL 38.50 (L) 02/10/2020  ? LDLDIRECT 30.1 11/14/2010  ? [place] 19 02/10/2020  ? ALT 21 08/14/2020  ? AST 17 08/14/2020  ? NA 141 08/14/2020  ? K 4.2 08/14/2020  ? CL 106 08/14/2020  ? CREATININE 1.03 08/14/2020  ? BUN 15 08/14/2020  ? CO2 22 08/14/2020  ? TSH 3.27 02/10/2020  ? PSA 3.57 08/14/2020  ? INR 1.2 (H) 05/15/2016  ? HGBA1C 7.5 (A) 06/11/2021  ? MICROALBUR 4.0 (H) 02/11/2019  ? ? ?DG HIP UNILAT WITH PELVIS 2-3 VIEWS RIGHT ? ?Result Date: 11/27/2016 ?CLINICAL DATA:  Right hip pain EXAM: DG HIP (WITH OR WITHOUT PELVIS) 2-3V RIGHT COMPARISON:  10/27/2016 FINDINGS: There is no  evidence of hip fracture or dislocation. There is no evidence of arthropathy or other focal bone abnormality. IMPRESSION: Negative. Electronically Signed   By: Franchot Gallo M.D.   On: 11/27/2016 15:58  ? ? ?Assessment & Plan:  ? ?Problem List Items Addressed  This Visit   ? ? Dyslipidemia associated with type 2 diabetes mellitus (HCC) (Chronic)  ?  Chronic. Cont on Lipitor ? ?  ?  ? Type 2 diabetes mellitus with vascular disease ([place])  ?  Worse ?Dr Loanne Drilling is retiring ?Cont on Metformin. Rybelsus is added ?  ?  ? Relevant Orders  ? Ambulatory referral to Endocrinology  ? CVA, old, hemiparesis ([place])  ?  Cont on ASA, Lipitor, Exforge ?  ?  ? Right hemiparesis ([place])  ?  Exercising a lot - gym 5-6 d/wk ?  ?  ? Hypertension associated with diabetes ([place])  ?  Chronic  ?Cont on Exforge, Cardura ?  ?  ? Insomnia  ?  New.  Try Tylenol PM at night.  Come back in a week or 2 if not better. ?  ?  ? Depressed mood  ?  New.  Unclear etiology.  Spend more time outside.  Try Tylenol PM at night to help with sleep.  Come back in a week or 2 if not better. ?  ?  ?  ? ? ?No orders of the defined types were placed in this encounter. ?  ? ? ?Follow-up: Return in about 6 months (around 12/14/2021) for Wellness Exam. ? ?Walker Kehr, MD ?

## 2021-06-13 NOTE — Assessment & Plan Note (Signed)
Cont on ASA, Lipitor, Exforge ?

## 2021-06-13 NOTE — Assessment & Plan Note (Signed)
Exercising a lot - gym 5-6 d/wk ?

## 2021-06-14 ENCOUNTER — Other Ambulatory Visit: Payer: Self-pay | Admitting: Internal Medicine

## 2021-07-01 ENCOUNTER — Ambulatory Visit: Payer: Medicare PPO | Admitting: Neurology

## 2021-07-01 ENCOUNTER — Encounter: Payer: Self-pay | Admitting: Neurology

## 2021-07-01 VITALS — BP 152/87 | HR 78 | Ht 71.0 in | Wt 213.0 lb

## 2021-07-01 DIAGNOSIS — G8111 Spastic hemiplegia affecting right dominant side: Secondary | ICD-10-CM | POA: Diagnosis not present

## 2021-07-01 DIAGNOSIS — I639 Cerebral infarction, unspecified: Secondary | ICD-10-CM | POA: Diagnosis not present

## 2021-07-01 MED ORDER — BACLOFEN 10 MG PO TABS: 10.0000 mg | ORAL_TABLET | Freq: Three times a day (TID) | ORAL | 3 refills | Status: DC

## 2021-07-01 NOTE — Patient Instructions (Signed)
Continue baclofen '10mg'$  three daily, OK to take an extra dose as needed ? ?Start stretching regimen before and after your work-out ? ?Monitor blood pressure at home, if it remains elevated (>150/100), follow-up with your primary care doctor ? ?Return to clinic in November ?

## 2021-07-01 NOTE — Progress Notes (Signed)
? ? ?Follow-up Visit ? ? ?Date: 07/01/21 ?  ? ?Delfino Lovett ?MRN: [id number] ?DOB: April 15, 1950 ? ? ?Interim History: ?Thelmer Legler is a 71 y.o. 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  The patient was accompanied to the clinic by self. ? ?UPDATE 06/29/2020:  He is here for 1 year follow-up.  He does not have any new complaints.  There has been no significant change in the right leg spasticity.  He is complaint with baclofen 50m TID.  He has been going to the pool almost daily and able to run in the water, but running on land is not possible.  He remains very active, but admits to not stretching as much as previously.  He also still tends to be forgetful.  No issues with keeping up with his daily activities.  He has started to use lumosity.   ? ?UPDATE 12/31/2020:  He is here for follow-up visit.  No new neurologocal complaints.  He has been much more compliant with daily stretching and feels this has helped his left leg tightness the most.  He remains on baclofen 160mthree times daily.  ? ?UPDATE 07/01/2020:  He is here for follow-up visit.  He remains very compliant with staying active and goes to the gym 5-6 days per week.  He admits to being less compliant with doing stretches.  There has been no significant improvement or worsening since his last visit.  He remains on balcofen 1015mID.  No new complaints.  ? ?Medications:  ?Current Outpatient Medications on File Prior to Visit  ?Medication Sig Dispense Refill  ? amLODipine-valsartan (EXFORGE) 5-320 MG tablet TAKE 1 TABLET BY MOUTH EVERY DAY 90 tablet 0  ? aspirin 325 MG tablet Take 1 tablet (325 mg total) by mouth daily. 100 tablet 3  ? atorvastatin (LIPITOR) 20 MG tablet Take 1 tablet (20 mg total) by mouth daily. 90 tablet 3  ? b complex vitamins tablet Take 1  tablet by mouth daily. 100 tablet 3  ? baclofen (LIORESAL) 10 MG tablet Take 1 tablet (10 mg total) by mouth 3 (three) times daily. 270 tablet 3  ? blood glucose meter kit and supplies KIT Dispense based on patient and insurance preference. Use two times daily as directed. (FOR ICD-9 250.00, 250.01). 1 each 0  ? Cholecalciferol 1000 UNITS tablet Take 1,000 Units by mouth daily.    ? docusate sodium (COLACE) 100 MG capsule Take 1 capsule (100 mg total) by mouth 2 (two) times daily. 60 capsule 11  ? doxazosin (CARDURA) 2 MG tablet TAKE 1 TABLET BY MOUTH EVERY DAY 90 tablet 3  ? glucose blood (ONETOUCH VERIO) test strip Use to check blood sugars twice a day Dx E11.9 ?Yearly physical w/labs are due must see MD for refills 100 each 0  ? metFORMIN (GLUCOPHAGE-XR) 500 MG 24 hr tablet TAKE 4 TABLETS (2,000 MG TOTAL) BY MOUTH DAILY 360 tablet 3  ? pantoprazole (PROTONIX) 40 MG tablet TAKE 1 TABLET BY MOUTH EVERY DAY 90 tablet 1  ? Psyllium (METAMUCIL PO) Take by mouth 2 (two) times a day.    ? Semaglutide (RYBELSUS) 7 MG TABS Take 7 mg by mouth daily. 90 tablet 3  ? sildenafil (VIAGRA) 100 MG tablet Take 1 tablet (100 mg total) by mouth daily as needed. 10 tablet 11  ? triamcinolone cream (KENALOG) 0.1 % APPLY TO  AFFECTED AREA TWICE A DAY 60 g 3  ? ?No current facility-administered medications on file prior to visit.  ? ? ?Allergies:  ?Allergies  ?Allergen Reactions  ? Accuretic [Quinapril-Hydrochlorothiazide]   ?  Drug rash ?  ? ? ?Vital Signs:  ?BP (!) 152/87   Pulse 78   Ht 5' 11"  (1.803 m)   Wt 213 lb (96.6 kg)   SpO2 100%   BMI 29.71 kg/m?  ? ? ?Neurological Exam: ?MENTAL STATUS including orientation to time, place, person, recent and remote memory, attention span and concentration, language, and fund of knowledge is normal.  Speech is not dysarthric. ? ?MOTOR:  Motor strength is 5/5 in all extremities, except right intrinsic hand muscles are 4+/5.  No atrophy, fasciculations or abnormal movements.  No pronator  drift.  Tone is 0+ in the RLE.   ? ?MSRs:  Reflexes are 2+/4 throughout, except right patella is 3+/4. ? ?COORDINATION/GAIT:  Finger and toe tapping is slowed on the right.  Gait shows mild spasticity in the right leg, stable and unassisted ? ?Data: ?MRI/A brain 01/04/2016: ?Acute sub cm infarction at the ventral pontomedullary junction just to the left of midline. ?Small acute left frontal subcortical white matter infarction. ?These 2 acute infarctions could be coincidental in due to ordinary small vessel disease, but emboli from the heart or ascending aorta could also result in this. ?Moderate chronic small-vessel ischemic changes of the cerebral hemispheric white matter for a age. ?Negative intracranial MR angiography of the large and medium size vessels. ?  ?Cardiac monitor 04/06/2016:  No arrythmia ?  ?Echo 01/06/2016:  Left ventricle: The cavity size was normal. Wall thickness was   normal. Systolic function was normal. The estimated ejection fraction was in the range of 55% to 60%.  No defect or patent foramen ovale was identified. ?  ?US carotids 01/05/2016:  1-39% bilateral ICA ?  ?Lab Results  ?Component Value Date  ? HGBA1C 7.5 (A) 06/11/2021  ? ?Lab Results  ?Component Value Date  ? [place] 19 02/10/2020  ? ? ? ?IMPRESSION/PLAN: ?1. 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 - Continue baclofen 6m TID + ok to take an extra dose as needed.  Refilled ? - Encouraged compliant with stretching exercises, especially before and after exercising ? ?2.  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  Cardioembolic source was suspected given the involvement of 2 vascular territories, however cardiac monitoring was normal.   ?If he has any new events, he will need implantable loop recorder ? - Continue aspirin 325 mg and Lipitor 20 mg ? - Recommend monitoring blood pressure at home, BP mildly elevated today 152/87  ? ?3.   Cognitive changes, amnestic (mild).  He remains highly independent with all IADLs and ADLs.   ? ?Return to clinic in 6-8 months ? ?Total time spent reviewing records, interview, history/exam, documentation, and coordination of care on day of encounter:  20 min ? ? ? ? ?Thank you for allowing me to participate in patient's care.  If I can answer any additional questions, I would be pleased to do so.   ? ?Sincerely, ? ? ? ?Londin Antone K. PPosey Pronto DO ? ? ?

## 2021-08-27 ENCOUNTER — Other Ambulatory Visit (INDEPENDENT_AMBULATORY_CARE_PROVIDER_SITE_OTHER): Payer: Medicare PPO

## 2021-08-27 DIAGNOSIS — E1169 Type 2 diabetes mellitus with other specified complication: Secondary | ICD-10-CM

## 2021-08-27 DIAGNOSIS — I69359 Hemiplegia and hemiparesis following cerebral infarction affecting unspecified side: Secondary | ICD-10-CM

## 2021-08-27 DIAGNOSIS — R972 Elevated prostate specific antigen [PSA]: Secondary | ICD-10-CM

## 2021-08-27 DIAGNOSIS — E1159 Type 2 diabetes mellitus with other circulatory complications: Secondary | ICD-10-CM | POA: Diagnosis not present

## 2021-08-27 DIAGNOSIS — E785 Hyperlipidemia, unspecified: Secondary | ICD-10-CM

## 2021-08-27 LAB — COMPREHENSIVE METABOLIC PANEL
ALT: 23 U/L (ref 0–53)
AST: 17 U/L (ref 0–37)
Albumin: 4.6 g/dL (ref 3.5–5.2)
Alkaline Phosphatase: 61 U/L (ref 39–117)
BUN: 11 mg/dL (ref 6–23)
CO2: 23 mEq/L (ref 19–32)
Calcium: 9.7 mg/dL (ref 8.4–10.5)
Chloride: 105 mEq/L (ref 96–112)
Creatinine, Ser: 0.86 mg/dL (ref 0.40–1.50)
GFR: 87.15 mL/min (ref >60.00)
Glucose, Bld: 99 mg/dL (ref 70–99)
Potassium: 4.3 mEq/L (ref 3.5–5.1)
Sodium: 140 mEq/L (ref 135–145)
Total Bilirubin: 0.8 mg/dL (ref 0.2–1.2)
Total Protein: 7.2 g/dL (ref 6.0–8.3)

## 2021-08-27 LAB — LIPID PANEL
Cholesterol: 74 mg/dL (ref 0–200)
HDL: 32.9 mg/dL — ABNORMAL LOW (ref >39.00)
LDL Cholesterol: 21 mg/dL (ref 0–99)
NonHDL: 41.37
Total CHOL/HDL Ratio: 2
Triglycerides: 104 mg/dL (ref 0.0–149.0)
VLDL: 20.8 mg/dL (ref 0.0–40.0)

## 2021-08-27 LAB — TSH: TSH: 3.03 u[IU]/mL (ref 0.35–5.50)

## 2021-08-27 LAB — PSA: PSA: 5.03 ng/mL — ABNORMAL HIGH (ref 0.10–4.00)

## 2021-08-27 LAB — HEMOGLOBIN A1C: Hgb A1c MFr Bld: 7.1 % — ABNORMAL HIGH (ref 4.6–6.5)

## 2021-08-28 DIAGNOSIS — E119 Type 2 diabetes mellitus without complications: Secondary | ICD-10-CM | POA: Diagnosis not present

## 2021-08-28 DIAGNOSIS — H353111 Nonexudative age-related macular degeneration, right eye, early dry stage: Secondary | ICD-10-CM | POA: Diagnosis not present

## 2021-08-28 DIAGNOSIS — H2513 Age-related nuclear cataract, bilateral: Secondary | ICD-10-CM | POA: Diagnosis not present

## 2021-08-28 LAB — HM DIABETES EYE EXAM

## 2021-09-07 ENCOUNTER — Other Ambulatory Visit: Payer: Self-pay | Admitting: Internal Medicine

## 2021-09-09 ENCOUNTER — Other Ambulatory Visit: Payer: Self-pay | Admitting: Internal Medicine

## 2021-09-09 DIAGNOSIS — E1159 Type 2 diabetes mellitus with other circulatory complications: Secondary | ICD-10-CM

## 2021-09-09 DIAGNOSIS — R972 Elevated prostate specific antigen [PSA]: Secondary | ICD-10-CM

## 2021-09-20 ENCOUNTER — Ambulatory Visit: Payer: Medicare PPO

## 2021-09-27 ENCOUNTER — Telehealth: Payer: Self-pay

## 2021-09-27 NOTE — Telephone Encounter (Signed)
LMTCB to scheduled with new provider

## 2021-09-30 ENCOUNTER — Ambulatory Visit: Payer: Medicare PPO

## 2021-09-30 ENCOUNTER — Telehealth: Payer: Self-pay

## 2021-09-30 NOTE — Telephone Encounter (Signed)
No return call from patient.  Please reschedule patient for AWV-S if he calls the office.

## 2021-09-30 NOTE — Telephone Encounter (Signed)
Patient scheduled for AWV-S via telephone at 3:45 pm.  Left v-mail to contact [place] at [phone] to complete.

## 2021-12-05 ENCOUNTER — Other Ambulatory Visit: Payer: Self-pay | Admitting: Internal Medicine

## 2021-12-13 ENCOUNTER — Ambulatory Visit (INDEPENDENT_AMBULATORY_CARE_PROVIDER_SITE_OTHER): Payer: Medicare PPO

## 2021-12-13 VITALS — Ht 70.0 in | Wt 207.0 lb

## 2021-12-13 DIAGNOSIS — Z1211 Encounter for screening for malignant neoplasm of colon: Secondary | ICD-10-CM

## 2021-12-13 DIAGNOSIS — Z0000 Encounter for general adult medical examination without abnormal findings: Secondary | ICD-10-CM | POA: Diagnosis not present

## 2021-12-13 DIAGNOSIS — Z5320 Procedure and treatment not carried out because of patient's decision for unspecified reasons: Secondary | ICD-10-CM

## 2021-12-13 NOTE — Progress Notes (Cosign Needed)
Subjective:   Christian Clements is a 71 y.o. male who presents for Medicare Annual/Subsequent preventive examination.   Virtual Visit via Telephone Note  I connected with  Christian Clements on 12/13/21 at  3:00 PM EDT by telephone and verified that I am speaking with the correct person using two identifiers.  Location: Patient: home  Provider: Nestor Ramp  Persons participating in the virtual visit: patient/Nurse Health Advisor   I discussed the limitations, risks, security and privacy concerns of performing an evaluation and management service by telephone and the availability of in person appointments. The patient expressed understanding and agreed to proceed.  Interactive audio and video telecommunications were attempted between this nurse and patient, however failed, due to patient having technical difficulties OR patient did not have access to video capability.  We continued and completed visit with audio only.  Some vital signs may be absent or patient reported.   Christian Reid, LPN  Review of Systems     Cardiac Risk Factors include: advanced age (>96men, >62 women);diabetes mellitus;hypertension;male gender     Objective:    Today's Vitals   12/13/21 1507  Weight: 207 lb (93.9 kg)  Height: 5\' 10"  (1.778 m)   Body mass index is 29.7 kg/m.     12/13/2021    3:12 PM 07/01/2021   10:26 AM 12/31/2020   10:27 AM 06/29/2020   10:26 AM 07/01/2019   10:37 AM 10/01/2018   10:28 AM 03/27/2016   10:18 AM  Advanced Directives  Does Patient Have a Medical Advance Directive? No No No No No No No  Would patient like information on creating a medical advance directive? No - Patient declined      No - Patient declined    Current Medications (verified) Outpatient Encounter Medications as of 12/13/2021  Medication Sig   amLODipine-valsartan (EXFORGE) 5-320 MG tablet Take 1 tablet by mouth daily. Annual appt due in Oct must see provider for future refills   aspirin 325 MG tablet Take 1  tablet (325 mg total) by mouth daily.   atorvastatin (LIPITOR) 20 MG tablet Take 1 tablet (20 mg total) by mouth daily.   b complex vitamins tablet Take 1 tablet by mouth daily.   baclofen (LIORESAL) 10 MG tablet Take 1 tablet (10 mg total) by mouth 3 (three) times daily. OK to take extra dose as needed   blood glucose meter kit and supplies KIT Dispense based on patient and insurance preference. Use two times daily as directed. (FOR ICD-9 250.00, 250.01).   Cholecalciferol 1000 UNITS tablet Take 1,000 Units by mouth daily.   docusate sodium (COLACE) 100 MG capsule Take 1 capsule (100 mg total) by mouth 2 (two) times daily.   doxazosin (CARDURA) 2 MG tablet TAKE 1 TABLET BY MOUTH EVERY DAY   glucose blood (ONETOUCH VERIO) test strip Use to check blood sugars twice a day Dx E11.9 Yearly physical w/labs are due must see MD for refills   metFORMIN (GLUCOPHAGE-XR) 500 MG 24 hr tablet TAKE 4 TABLETS (2,000 MG TOTAL) BY MOUTH DAILY   pantoprazole (PROTONIX) 40 MG tablet TAKE 1 TABLET BY MOUTH EVERY DAY   Psyllium (METAMUCIL PO) Take by mouth 2 (two) times a day.   Semaglutide (RYBELSUS) 7 MG TABS Take 7 mg by mouth daily.   triamcinolone cream (KENALOG) 0.1 % APPLY TO AFFECTED AREA TWICE A DAY   sildenafil (VIAGRA) 100 MG tablet Take 1 tablet (100 mg total) by mouth daily as needed. (Patient not taking: Reported  on 12/13/2021)   No facility-administered encounter medications on file as of 12/13/2021.    Allergies (verified) Accuretic [quinapril-hydrochlorothiazide]   History: Past Medical History:  Diagnosis Date   Diabetes mellitus    Hypertension    Prostatitis 2011   Stroke [facility])    Past Surgical History:  Procedure Laterality Date   COLONOSCOPY     None     Family History  Problem Relation Age of Onset   Cancer Mother 48       leukemia   Hypertension Other    Diabetes Maternal Grandfather    Stroke Sister    Cancer Brother    HIV/AIDS Brother    Colon cancer Neg Hx     Esophageal cancer Neg Hx    Liver cancer Neg Hx    Rectal cancer Neg Hx    Stomach cancer Neg Hx    Colon polyps Neg Hx    Social History   Socioeconomic History   Marital status: Married    Spouse name: Not on file   Number of children: 0   Years of education: 16   Highest education level: Bachelor's degree (e.g., BA, AB, BS)  Occupational History   Occupation: PE teacher    Employer: [employer]  Tobacco Use   Smoking status: Former   Smokeless tobacco: Never  Scientific laboratory technician Use: Never used  Substance and Sexual Activity   Alcohol use: Yes    Alcohol/week: 3.0 standard drinks of alcohol    Types: 3 Glasses of wine per week    Comment: Socially   Drug use: No   Sexual activity: Yes  Other Topics Concern   Not on file  Social History Narrative   Regular exercise- yes.  Lives with wife in a 2 story home.  Has no children.     Retired Careers information officer.     Education: college.    Right Handed      Social Determinants of Health   Financial Resource Strain: Low Risk  (12/13/2021)   Overall Financial Resource Strain (CARDIA)    Difficulty of Paying Living Expenses: Not hard at all  Food Insecurity: No Food Insecurity (12/13/2021)   Hunger Vital Sign    Worried About Running Out of Food in the Last Year: Never true    Ran Out of Food in the Last Year: Never true  Transportation Needs: No Transportation Needs (12/13/2021)   PRAPARE - Hydrologist (Medical): No    Lack of Transportation (Non-Medical): No  Physical Activity: Sufficiently Active (12/13/2021)   Exercise Vital Sign    Days of Exercise per Week: 6 days    Minutes of Exercise per Session: 60 min  Stress: No Stress Concern Present (12/13/2021)   [place]    Feeling of Stress : Not at all  Social Connections: Moderately Integrated (12/13/2021)   Social Connection and Isolation Panel [NHANES]    Frequency of  Communication with Friends and Family: More than three times a week    Frequency of Social Gatherings with Friends and Family: More than three times a week    Attends Religious Services: 1 to 4 times per year    Active Member of [employer] or Organizations: No    Attends Archivist Meetings: Never    Marital Status: Married    Tobacco Counseling Counseling given: Not Answered   Clinical Intake:  Pre-visit preparation completed: Yes  Pain :  No/denies pain     Nutritional Risks: None Diabetes: No  How often do you need to have someone help you when you read instructions, pamphlets, or other written materials from your doctor or pharmacy?: 1 - Never  Diabetic?yes  Nutrition Risk Assessment:  Has the patient had any N/V/D within the last 2 months?  No  Does the patient have any non-healing wounds?  No  Has the patient had any unintentional weight loss or weight gain?  No   Diabetes:  Is the patient diabetic?  Yes  If diabetic, was a CBG obtained today?  No  Did the patient bring in their glucometer from home?  No  How often do you monitor your CBG's? 2 x week .   Financial Strains and Diabetes Management:  Are you having any financial strains with the device, your supplies or your medication? No .  Does the patient want to be seen by Chronic Care Management for management of their diabetes?  No  Would the patient like to be referred to a Nutritionist or for Diabetic Management?  No   Diabetic Exams:  Diabetic Eye Exam: Completed 05/2021 Diabetic Foot Exam: Overdue, Pt has been advised about the importance in completing this exam. Pt is scheduled for diabetic foot exam on next office visit .   Interpreter Needed?: No  Information entered by :: Jadene Pierini, LPN   Activities of Daily Living    12/13/2021    3:12 PM  In your present state of health, do you have any difficulty performing the following activities:  Hearing? 0  Vision? 0  Difficulty  concentrating or making decisions? 0  Walking or climbing stairs? 0  Dressing or bathing? 0  Doing errands, shopping? 0  Preparing Food and eating ? N  Using the Toilet? N  In the past six months, have you accidently leaked urine? N  Do you have problems with loss of bowel control? N  Managing your Medications? N  Managing your Finances? N  Housekeeping or managing your Housekeeping? N    Patient Care Team: Plotnikov, Evie Lacks, MD as PCP - General Renato Shin, MD (Inactive) as Consulting Physician (Endocrinology) Alda Berthold, DO as Consulting Physician (Neurology) Paralee Cancel, MD as Consulting Physician (Orthopedic Surgery)  Indicate any recent Medical Services you may have received from other than [facility] providers in the past year (date may be approximate).     Assessment:   This is a routine wellness examination for [place].  Hearing/Vision screen Vision Screening - Comments:: Annual eye exams wears glasses   Dietary issues and exercise activities discussed: Current Exercise Habits: Home exercise routine, Type of exercise: strength training/weights;walking, Time (Minutes): 60, Frequency (Times/Week): 5, Weekly Exercise (Minutes/Week): 300, Intensity: Mild, Exercise limited by: None identified   Goals Addressed             This Visit's Progress    DIET - INCREASE WATER INTAKE         Depression Screen    12/13/2021    3:11 PM 05/17/2020    8:41 AM 02/14/2019    8:07 AM 03/12/2017    7:48 AM 02/13/2016    9:25 AM  PHQ 2/9 Scores  PHQ - 2 Score 0 0 0 0 0  PHQ- 9 Score  0       Fall Risk    12/13/2021    3:08 PM 07/01/2021   10:26 AM 12/31/2020   10:26 AM 06/29/2020   10:26 AM 05/17/2020  8:41 AM  Fall Risk   Falls in the past year? 0 0 0  0  Number falls in past yr: 0 0 0 0 0  Injury with Fall? 0 0 0 0 0  Risk for fall due to : No Fall Risks    No Fall Risks  Follow up Falls prevention discussed        FALL RISK PREVENTION PERTAINING TO THE  HOME:  Any stairs in or around the home? Yes  If so, are there any without handrails? No  Home free of loose throw rugs in walkways, pet beds, electrical cords, etc? Yes  Adequate lighting in your home to reduce risk of falls? Yes   ASSISTIVE DEVICES UTILIZED TO PREVENT FALLS:  Life alert? No  Use of a cane, walker or w/c? No  Grab bars in the bathroom? Yes  Shower chair or bench in shower? No  Elevated toilet seat or a handicapped toilet? No        12/11/2017   11:14 AM  Montreal Cognitive Assessment   Visuospatial/ Executive (0/5) 5  Naming (0/3) 3  Attention: Read list of digits (0/2) 2  Attention: Read list of letters (0/1) 1  Attention: Serial 7 subtraction starting at 100 (0/3) 2  Language: Repeat phrase (0/2) 2  Language : Fluency (0/1) 1  Abstraction (0/2) 1  Delayed Recall (0/5) 0  Orientation (0/6) 6  Total 23  Adjusted Score (based on education) 23      12/13/2021    3:13 PM  6CIT Screen  What Year? 0 points  What month? 0 points  What time? 0 points  Count back from 20 0 points  Months in reverse 0 points  Repeat phrase 0 points  Total Score 0 points    Immunizations Immunization History  Administered Date(s) Administered   Fluad Quad(high Dose 65+) 02/15/2020, 01/02/2021   PFIZER Comirnaty(Gray Top)Covid-19 Tri-Sucrose Vaccine 07/21/2020   PFIZER(Purple Top)SARS-COV-2 Vaccination 04/17/2019, 05/09/2019, 12/27/2019    TDAP status: Due, Education has been provided regarding the importance of this vaccine. Advised may receive this vaccine at local pharmacy or Health Dept. Aware to provide a copy of the vaccination record if obtained from local pharmacy or Health Dept. Verbalized acceptance and understanding.  Flu Vaccine status: Due, Education has been provided regarding the importance of this vaccine. Advised may receive this vaccine at local pharmacy or Health Dept. Aware to provide a copy of the vaccination record if obtained from local pharmacy or  Health Dept. Verbalized acceptance and understanding.  Pneumococcal vaccine status: Up to date  Covid-19 vaccine status: Completed vaccines  Qualifies for Shingles Vaccine? Yes   Zostavax completed No   Shingrix Completed?: No.    Education has been provided regarding the importance of this vaccine. Patient has been advised to call insurance company to determine out of pocket expense if they have not yet received this vaccine. Advised may also receive vaccine at local pharmacy or Health Dept. Verbalized acceptance and understanding.  Screening Tests Health Maintenance  Topic Date Due   Hepatitis C Screening  Never done   TETANUS/TDAP  Never done   Zoster Vaccines- Shingrix (1 of 2) Never done   COLONOSCOPY (Pts 45-65yrs Insurance coverage will need to be confirmed)  11/27/2010   Pneumonia Vaccine 53+ Years old (1 - PCV) Never done   Diabetic kidney evaluation - Urine ACR  02/11/2020   COVID-19 Vaccine (5 - Pfizer series) 09/15/2020   INFLUENZA VACCINE  10/22/2021   FOOT EXAM  12/12/2021   HEMOGLOBIN A1C  02/26/2022   Diabetic kidney evaluation - GFR measurement  08/28/2022   OPHTHALMOLOGY EXAM  08/29/2022   HPV VACCINES  Aged Out    Health Maintenance  Health Maintenance Due  Topic Date Due   Hepatitis C Screening  Never done   TETANUS/TDAP  Never done   Zoster Vaccines- Shingrix (1 of 2) Never done   COLONOSCOPY (Pts 45-44yrs Insurance coverage will need to be confirmed)  11/27/2010   Pneumonia Vaccine 63+ Years old (1 - PCV) Never done   Diabetic kidney evaluation - Urine ACR  02/11/2020   COVID-19 Vaccine (5 - Pfizer series) 09/15/2020   INFLUENZA VACCINE  10/22/2021   FOOT EXAM  12/12/2021    Colorectal cancer screening: Referral to GI placed 069/22/2023. Pt aware the office will call re: appt.  Lung Cancer Screening: (Low Dose CT Chest recommended if Age 30-80 years, 30 pack-year currently smoking OR have quit w/in 15years.) does not qualify.   Lung Cancer  Screening Referral: n/a  Additional Screening:  Hepatitis C Screening: does not qualify;   Vision Screening: Recommended annual ophthalmology exams for early detection of glaucoma and other disorders of the eye. Is the patient up to date with their annual eye exam?  Yes  Who is the provider or what is the name of the office in which the patient attends annual eye exams? [facility]  If pt is not established with a provider, would they like to be referred to a provider to establish care? No .   Dental Screening: Recommended annual dental exams for proper oral hygiene  Community Resource Referral / Chronic Care Management: CRR required this visit?  No   CCM required this visit?  No      Plan:     I have personally reviewed and noted the following in the patient's chart:   Medical and social history Use of alcohol, tobacco or illicit drugs  Current medications and supplements including opioid prescriptions. Patient is not currently taking opioid prescriptions. Functional ability and status Nutritional status Physical activity Advanced directives List of other physicians Hospitalizations, surgeries, and ER visits in previous 12 months Vitals Screenings to include cognitive, depression, and falls Referrals and appointments  In addition, I have reviewed and discussed with patient certain preventive protocols, quality metrics, and best practice recommendations. A written personalized care plan for preventive services as well as general preventive health recommendations were provided to patient.     Daphane Shepherd, LPN   [id number]   Nurse Notes: Due TDAP/ Pneumonia     Medical screening examination/treatment/procedure(s) were performed by non-physician practitioner and as supervising physician I was immediately available for consultation/collaboration.  I agree with above. Lew Dawes, MD

## 2021-12-13 NOTE — Patient Instructions (Signed)
Christian Clements , Thank you for taking time to come for your Medicare Wellness Visit. I appreciate your ongoing commitment to your health goals. Please review the following plan we discussed and let me know if I can assist you in the future.   These are the goals we discussed:  Goals      DIET - INCREASE WATER INTAKE        This is a list of the screening recommended for you and due dates:  Health Maintenance  Topic Date Due   Hepatitis C Screening: USPSTF Recommendation to screen - Ages 54-79 yo.  Never done   Tetanus Vaccine  Never done   Zoster (Shingles) Vaccine (1 of 2) Never done   Colon Cancer Screening  11/27/2010   Pneumonia Vaccine (1 - PCV) Never done   Yearly kidney health urinalysis for diabetes  02/11/2020   COVID-19 Vaccine (5 - Pfizer series) 09/15/2020   Flu Shot  10/22/2021   Complete foot exam   12/12/2021   Hemoglobin A1C  02/26/2022   Yearly kidney function blood test for diabetes  08/28/2022   Eye exam for diabetics  08/29/2022   HPV Vaccine  Aged Out    Advanced directives: Advance directive discussed with you today. I have provided a copy for you to complete at home and have notarized. Once this is complete please bring a copy in to our office so we can scan it into your chart.   Conditions/risks identified: Aim for 30 minutes of exercise or brisk walking, 6-8 glasses of water, and 5 servings of fruits and vegetables each day.   Next appointment: Follow up in one year for your annual wellness visit.   Preventive Care 35 Years and Older, Male  Preventive care refers to lifestyle choices and visits with your health care provider that can promote health and wellness. What does preventive care include? A yearly physical exam. This is also called an annual well check. Dental exams once or twice a year. Routine eye exams. Ask your health care provider how often you should have your eyes checked. Personal lifestyle choices, including: Daily care of your teeth and  gums. Regular physical activity. Eating a healthy diet. Avoiding tobacco and drug use. Limiting alcohol use. Practicing safe sex. Taking low doses of aspirin every day. Taking vitamin and mineral supplements as recommended by your health care provider. What happens during an annual well check? The services and screenings done by your health care provider during your annual well check will depend on your age, overall health, lifestyle risk factors, and family history of disease. Counseling  Your health care provider may ask you questions about your: Alcohol use. Tobacco use. Drug use. Emotional well-being. Home and relationship well-being. Sexual activity. Eating habits. History of falls. Memory and ability to understand (cognition). Work and work Statistician. Screening  You may have the following tests or measurements: Height, weight, and BMI. Blood pressure. Lipid and cholesterol levels. These may be checked every 5 years, or more frequently if you are over 57 years old. Skin check. Lung cancer screening. You may have this screening every year starting at age 63 if you have a 30-pack-year history of smoking and currently smoke or have quit within the past 15 years. Fecal occult blood test (FOBT) of the stool. You may have this test every year starting at age 47. Flexible sigmoidoscopy or colonoscopy. You may have a sigmoidoscopy every 5 years or a colonoscopy every 10 years starting at age 59. Prostate cancer  screening. Recommendations will vary depending on your family history and other risks. Hepatitis C blood test. Hepatitis B blood test. Sexually transmitted disease (STD) testing. Diabetes screening. This is done by checking your blood sugar (glucose) after you have not eaten for a while (fasting). You may have this done every 1-3 years. Abdominal aortic aneurysm (AAA) screening. You may need this if you are a current or former smoker. Osteoporosis. You may be screened  starting at age 56 if you are at high risk. Talk with your health care provider about your test results, treatment options, and if necessary, the need for more tests. Vaccines  Your health care provider may recommend certain vaccines, such as: Influenza vaccine. This is recommended every year. Tetanus, diphtheria, and acellular pertussis (Tdap, Td) vaccine. You may need a Td booster every 10 years. Zoster vaccine. You may need this after age 53. Pneumococcal 13-valent conjugate (PCV13) vaccine. One dose is recommended after age 60. Pneumococcal polysaccharide (PPSV23) vaccine. One dose is recommended after age 40. Talk to your health care provider about which screenings and vaccines you need and how often you need them. This information is not intended to replace advice given to you by your health care provider. Make sure you discuss any questions you have with your health care provider. Document Released: 04/06/2015 Document Revised: 11/28/2015 Document Reviewed: 01/09/2015 Elsevier Interactive Patient Education  2017 [place] Prevention in the Home Falls can cause injuries. They can happen to people of all ages. There are many things you can do to make your home safe and to help prevent falls. What can I do on the outside of my home? Regularly fix the edges of walkways and driveways and fix any cracks. Remove anything that might make you trip as you walk through a door, such as a raised step or threshold. Trim any bushes or trees on the path to your home. Use bright outdoor lighting. Clear any walking paths of anything that might make someone trip, such as rocks or tools. Regularly check to see if handrails are loose or broken. Make sure that both sides of any steps have handrails. Any raised decks and porches should have guardrails on the edges. Have any leaves, snow, or ice cleared regularly. Use sand or salt on walking paths during winter. Clean up any spills in your garage  right away. This includes oil or grease spills. What can I do in the bathroom? Use night lights. Install grab bars by the toilet and in the tub and shower. Do not use towel bars as grab bars. Use non-skid mats or decals in the tub or shower. If you need to sit down in the shower, use a plastic, non-slip stool. Keep the floor dry. Clean up any water that spills on the floor as soon as it happens. Remove soap buildup in the tub or shower regularly. Attach bath mats securely with double-sided non-slip rug tape. Do not have throw rugs and other things on the floor that can make you trip. What can I do in the bedroom? Use night lights. Make sure that you have a light by your bed that is easy to reach. Do not use any sheets or blankets that are too big for your bed. They should not hang down onto the floor. Have a firm chair that has side arms. You can use this for support while you get dressed. Do not have throw rugs and other things on the floor that can make you trip. What can  I do in the kitchen? Clean up any spills right away. Avoid walking on wet floors. Keep items that you use a lot in easy-to-reach places. If you need to reach something above you, use a strong step stool that has a grab bar. Keep electrical cords out of the way. Do not use floor polish or wax that makes floors slippery. If you must use wax, use non-skid floor wax. Do not have throw rugs and other things on the floor that can make you trip. What can I do with my stairs? Do not leave any items on the stairs. Make sure that there are handrails on both sides of the stairs and use them. Fix handrails that are broken or loose. Make sure that handrails are as long as the stairways. Check any carpeting to make sure that it is firmly attached to the stairs. Fix any carpet that is loose or worn. Avoid having throw rugs at the top or bottom of the stairs. If you do have throw rugs, attach them to the floor with carpet tape. Make  sure that you have a light switch at the top of the stairs and the bottom of the stairs. If you do not have them, ask someone to add them for you. What else can I do to help prevent falls? Wear shoes that: Do not have high heels. Have rubber bottoms. Are comfortable and fit you well. Are closed at the toe. Do not wear sandals. If you use a stepladder: Make sure that it is fully opened. Do not climb a closed stepladder. Make sure that both sides of the stepladder are locked into place. Ask someone to hold it for you, if possible. Clearly mark and make sure that you can see: Any grab bars or handrails. First and last steps. Where the edge of each step is. Use tools that help you move around (mobility aids) if they are needed. These include: Canes. Walkers. Scooters. Crutches. Turn on the lights when you go into a dark area. Replace any light bulbs as soon as they burn out. Set up your furniture so you have a clear path. Avoid moving your furniture around. If any of your floors are uneven, fix them. If there are any pets around you, be aware of where they are. Review your medicines with your doctor. Some medicines can make you feel dizzy. This can increase your chance of falling. Ask your doctor what other things that you can do to help prevent falls. This information is not intended to replace advice given to you by your health care provider. Make sure you discuss any questions you have with your health care provider. Document Released: 01/04/2009 Document Revised: 08/16/2015 Document Reviewed: 04/14/2014 Elsevier Interactive Patient Education  2017 [employer].

## 2021-12-17 ENCOUNTER — Ambulatory Visit: Payer: Medicare PPO | Admitting: Internal Medicine

## 2021-12-17 ENCOUNTER — Encounter: Payer: Self-pay | Admitting: Internal Medicine

## 2021-12-17 DIAGNOSIS — E1169 Type 2 diabetes mellitus with other specified complication: Secondary | ICD-10-CM | POA: Diagnosis not present

## 2021-12-17 DIAGNOSIS — E785 Hyperlipidemia, unspecified: Secondary | ICD-10-CM

## 2021-12-17 DIAGNOSIS — E1159 Type 2 diabetes mellitus with other circulatory complications: Secondary | ICD-10-CM

## 2021-12-17 DIAGNOSIS — G8191 Hemiplegia, unspecified affecting right dominant side: Secondary | ICD-10-CM

## 2021-12-17 DIAGNOSIS — F5101 Primary insomnia: Secondary | ICD-10-CM | POA: Diagnosis not present

## 2021-12-17 DIAGNOSIS — I69359 Hemiplegia and hemiparesis following cerebral infarction affecting unspecified side: Secondary | ICD-10-CM | POA: Diagnosis not present

## 2021-12-17 DIAGNOSIS — R972 Elevated prostate specific antigen [PSA]: Secondary | ICD-10-CM | POA: Diagnosis not present

## 2021-12-17 LAB — COMPREHENSIVE METABOLIC PANEL
ALT: 27 U/L (ref 0–53)
AST: 18 U/L (ref 0–37)
Albumin: 4.7 g/dL (ref 3.5–5.2)
Alkaline Phosphatase: 62 U/L (ref 39–117)
BUN: 13 mg/dL (ref 6–23)
CO2: 24 mEq/L (ref 19–32)
Calcium: 9.7 mg/dL (ref 8.4–10.5)
Chloride: 105 mEq/L (ref 96–112)
Creatinine, Ser: 0.99 mg/dL (ref 0.40–1.50)
GFR: 76.5 mL/min (ref >60.00)
Glucose, Bld: 143 mg/dL — ABNORMAL HIGH (ref 70–99)
Potassium: 3.9 mEq/L (ref 3.5–5.1)
Sodium: 139 mEq/L (ref 135–145)
Total Bilirubin: 0.8 mg/dL (ref 0.2–1.2)
Total Protein: 7.8 g/dL (ref 6.0–8.3)

## 2021-12-17 LAB — HEMOGLOBIN A1C: Hgb A1c MFr Bld: 7.9 % — ABNORMAL HIGH (ref 4.6–6.5)

## 2021-12-17 MED ORDER — ZOLPIDEM TARTRATE 10 MG PO TABS: 5.0000 mg | ORAL_TABLET | Freq: Every evening | ORAL | 1 refills | Status: DC | PRN

## 2021-12-17 NOTE — Assessment & Plan Note (Addendum)
Zolpidem option was discussed - low dose.  Potential benefits of a long term opioids use as well as potential risks (i.e. addiction risk, apnea etc) and complications (i.e. Somnolence, constipation and others) were explained to the patient and were aknowledged.

## 2021-12-17 NOTE — Assessment & Plan Note (Signed)
Dr Loanne Drilling is retiring Cont on Metformin. Rybelsus is added Pt refused shots, ie Prevnar 20

## 2021-12-17 NOTE — Assessment & Plan Note (Signed)
Chronic. Exercising a lot - gym 5-6 d/wk

## 2021-12-17 NOTE — Assessment & Plan Note (Signed)
Mild R hemiparesis - better Cont on ASA, Lipitor, Exforge

## 2021-12-17 NOTE — Assessment & Plan Note (Signed)
Chronic. Cont on Lipitor

## 2021-12-17 NOTE — Progress Notes (Signed)
Subjective:  Patient ID: Christian Clements, male    DOB: 1950/10/29  Age: 71 y.o. MRN: [id number]  CC: Follow-up (6 MONTH F/U)   HPI Delfino Lovett presents for HTN, DM, h/o CVA C/o insomnia  Outpatient Medications Prior to Visit  Medication Sig Dispense Refill   amLODipine-valsartan (EXFORGE) 5-320 MG tablet Take 1 tablet by mouth daily. Annual appt due in Oct must see provider for future refills 30 tablet 0   aspirin 325 MG tablet Take 1 tablet (325 mg total) by mouth daily. 100 tablet 3   atorvastatin (LIPITOR) 20 MG tablet Take 1 tablet (20 mg total) by mouth daily. 90 tablet 3   b complex vitamins tablet Take 1 tablet by mouth daily. 100 tablet 3   baclofen (LIORESAL) 10 MG tablet Take 1 tablet (10 mg total) by mouth 3 (three) times daily. OK to take extra dose as needed 300 tablet 3   blood glucose meter kit and supplies KIT Dispense based on patient and insurance preference. Use two times daily as directed. (FOR ICD-9 250.00, 250.01). 1 each 0   Cholecalciferol 1000 UNITS tablet Take 1,000 Units by mouth daily.     docusate sodium (COLACE) 100 MG capsule Take 1 capsule (100 mg total) by mouth 2 (two) times daily. 60 capsule 11   doxazosin (CARDURA) 2 MG tablet TAKE 1 TABLET BY MOUTH EVERY DAY 90 tablet 3   glucose blood (ONETOUCH VERIO) test strip Use to check blood sugars twice a day Dx E11.9 Yearly physical w/labs are due must see MD for refills 100 each 0   metFORMIN (GLUCOPHAGE-XR) 500 MG 24 hr tablet TAKE 4 TABLETS (2,000 MG TOTAL) BY MOUTH DAILY 360 tablet 3   pantoprazole (PROTONIX) 40 MG tablet TAKE 1 TABLET BY MOUTH EVERY DAY 90 tablet 1   Psyllium (METAMUCIL PO) Take by mouth 2 (two) times a day.     Semaglutide (RYBELSUS) 7 MG TABS Take 7 mg by mouth daily. 90 tablet 3   sildenafil (VIAGRA) 100 MG tablet Take 1 tablet (100 mg total) by mouth daily as needed. 10 tablet 11   triamcinolone cream (KENALOG) 0.1 % APPLY TO AFFECTED AREA TWICE A DAY 60 g 3   No  facility-administered medications prior to visit.    ROS: Review of Systems  Constitutional:  Negative for appetite change, fatigue and unexpected weight change.  HENT:  Negative for congestion, nosebleeds, sneezing, sore throat and trouble swallowing.   Eyes:  Negative for itching and visual disturbance.  Respiratory:  Negative for cough.   Cardiovascular:  Negative for chest pain, palpitations and leg swelling.  Gastrointestinal:  Negative for abdominal distention, blood in stool, diarrhea and nausea.  Genitourinary:  Negative for frequency and hematuria.  Musculoskeletal:  Negative for back pain, gait problem, joint swelling and neck pain.  Skin:  Negative for rash.  Neurological:  Positive for weakness. Negative for dizziness, tremors and speech difficulty.  Psychiatric/Behavioral:  Negative for agitation, dysphoric mood, sleep disturbance and suicidal ideas. The patient is not nervous/anxious.     Objective:  BP 132/80 (BP Location: Left Arm)   Pulse 86   Temp 98.2 F (36.8 C) (Oral)   Ht 5' 11"  (1.803 m)   Wt 216 lb 6.4 oz (98.2 kg)   SpO2 95%   BMI 30.18 kg/m   BP Readings from Last 3 Encounters:  12/17/21 132/80  07/01/21 (!) 152/87  06/13/21 132/82    Wt Readings from Last 3 Encounters:  12/17/21 216 lb 6.4  oz (98.2 kg)  12/13/21 207 lb (93.9 kg)  07/01/21 213 lb (96.6 kg)    Physical Exam Constitutional:      General: He is not in acute distress.    Appearance: Normal appearance. He is well-developed.     Comments: NAD  Eyes:     Conjunctiva/sclera: Conjunctivae normal.     Pupils: Pupils are equal, round, and reactive to light.  Neck:     Thyroid: No thyromegaly.     Vascular: No JVD.  Cardiovascular:     Rate and Rhythm: Normal rate and regular rhythm.     Heart sounds: Normal heart sounds. No murmur heard.    No friction rub. No gallop.  Pulmonary:     Effort: Pulmonary effort is normal. No respiratory distress.     Breath sounds: Normal breath  sounds. No wheezing or rales.  Chest:     Chest wall: No tenderness.  Abdominal:     General: Bowel sounds are normal. There is no distension.     Palpations: Abdomen is soft. There is no mass.     Tenderness: There is no abdominal tenderness. There is no guarding or rebound.  Musculoskeletal:        General: No tenderness. Normal range of motion.     Cervical back: Normal range of motion.  Lymphadenopathy:     Cervical: No cervical adenopathy.  Skin:    General: Skin is warm and dry.     Findings: No rash.  Neurological:     Mental Status: He is alert and oriented to person, place, and time.     Cranial Nerves: No cranial nerve deficit.     Motor: No abnormal muscle tone.     Coordination: Coordination normal.     Gait: Gait normal.     Deep Tendon Reflexes: Reflexes are normal and symmetric.  Psychiatric:        Behavior: Behavior normal.        Thought Content: Thought content normal.        Judgment: Judgment normal.     Lab Results  Component Value Date   WBC 7.2 02/10/2020   HGB 14.6 02/10/2020   HCT 42.8 02/10/2020   PLT 289.0 02/10/2020   GLUCOSE 99 08/27/2021   CHOL 74 08/27/2021   TRIG 104.0 08/27/2021   HDL 32.90 (L) 08/27/2021   LDLDIRECT 30.1 11/14/2010   LDLCALC 21 08/27/2021   ALT 23 08/27/2021   AST 17 08/27/2021   NA 140 08/27/2021   K 4.3 08/27/2021   CL 105 08/27/2021   CREATININE 0.86 08/27/2021   BUN 11 08/27/2021   CO2 23 08/27/2021   TSH 3.03 08/27/2021   PSA 5.03 (H) 08/27/2021   INR 1.2 (H) 05/15/2016   HGBA1C 7.1 (H) 08/27/2021   MICROALBUR 4.0 (H) 02/11/2019    DG HIP UNILAT WITH PELVIS 2-3 VIEWS RIGHT  Result Date: 11/27/2016 CLINICAL DATA:  Right hip pain EXAM: DG HIP (WITH OR WITHOUT PELVIS) 2-3V RIGHT COMPARISON:  10/27/2016 FINDINGS: There is no evidence of hip fracture or dislocation. There is no evidence of arthropathy or other focal bone abnormality. IMPRESSION: Negative. Electronically Signed   By: Franchot Gallo M.D.   On:  11/27/2016 15:58    Assessment & Plan:   Problem List Items Addressed This Visit     CVA, old, hemiparesis ([place])    Mild R hemiparesis - better Cont on ASA, Lipitor, Exforge       Dyslipidemia associated with type 2 diabetes  mellitus ([facility]) (Chronic)    Chronic. Cont on Lipitor      Insomnia    Zolpidem option was discussed - low dose.  Potential benefits of a long term opioids use as well as potential risks (i.e. addiction risk, apnea etc) and complications (i.e. Somnolence, constipation and others) were explained to the patient and were aknowledged.       Right hemiparesis ([facility])    Chronic. Exercising a lot - gym 5-6 d/wk       Type 2 diabetes mellitus with vascular disease ([place])    Dr Loanne Drilling is retiring Cont on Metformin. Rybelsus is added Pt refused shots, ie Prevnar 20         Meds ordered this encounter  Medications   zolpidem (AMBIEN) 10 MG tablet    Sig: Take 0.5-1 tablets (5-10 mg total) by mouth at bedtime as needed for sleep.    Dispense:  30 tablet    Refill:  1      Follow-up: Return in about 4 months (around 04/18/2022) for a follow-up visit.  Walker Kehr, MD

## 2021-12-18 LAB — PSA, TOTAL AND FREE
PSA, % Free: 14 % (calc) — ABNORMAL LOW (ref >25)
PSA, Free: 0.7 ng/mL
PSA, Total: 5.1 ng/mL — ABNORMAL HIGH (ref <=4.0)

## 2021-12-19 ENCOUNTER — Other Ambulatory Visit: Payer: Self-pay | Admitting: *Deleted

## 2021-12-19 MED ORDER — AMLODIPINE BESYLATE-VALSARTAN 5-320 MG PO TABS: 1.0000 | ORAL_TABLET | Freq: Every day | ORAL | 1 refills | Status: DC

## 2021-12-24 ENCOUNTER — Encounter: Payer: Self-pay | Admitting: Internal Medicine

## 2021-12-24 DIAGNOSIS — R972 Elevated prostate specific antigen [PSA]: Secondary | ICD-10-CM

## 2021-12-25 NOTE — Telephone Encounter (Signed)
Pt walk-in wanting to know was referral place to see urologist. Look at pt mychart msg per MD refer to urologist due to elevated psa. Since MD is out of office until Monday place referral for urologist../lmb

## 2022-01-31 ENCOUNTER — Encounter: Payer: Self-pay | Admitting: Internal Medicine

## 2022-02-06 ENCOUNTER — Other Ambulatory Visit (INDEPENDENT_AMBULATORY_CARE_PROVIDER_SITE_OTHER): Payer: Medicare PPO

## 2022-02-06 DIAGNOSIS — E1159 Type 2 diabetes mellitus with other circulatory complications: Secondary | ICD-10-CM | POA: Diagnosis not present

## 2022-02-06 LAB — BASIC METABOLIC PANEL
BUN: 13 mg/dL (ref 6–23)
CO2: 26 mEq/L (ref 19–32)
Calcium: 9.5 mg/dL (ref 8.4–10.5)
Chloride: 105 mEq/L (ref 96–112)
Creatinine, Ser: 1.02 mg/dL (ref 0.40–1.50)
GFR: 73.74 mL/min (ref >60.00)
Glucose, Bld: 147 mg/dL — ABNORMAL HIGH (ref 70–99)
Potassium: 4.1 mEq/L (ref 3.5–5.1)
Sodium: 141 mEq/L (ref 135–145)

## 2022-02-06 LAB — HEMOGLOBIN A1C: Hgb A1c MFr Bld: 7.4 % — ABNORMAL HIGH (ref 4.6–6.5)

## 2022-02-07 ENCOUNTER — Encounter: Payer: Self-pay | Admitting: Physician Assistant

## 2022-02-14 ENCOUNTER — Other Ambulatory Visit: Payer: Self-pay | Admitting: Internal Medicine

## 2022-02-21 ENCOUNTER — Ambulatory Visit: Payer: Medicare PPO | Admitting: Neurology

## 2022-02-21 ENCOUNTER — Encounter: Payer: Self-pay | Admitting: Neurology

## 2022-02-21 VITALS — BP 149/84 | HR 59 | Ht 71.0 in | Wt 217.0 lb

## 2022-02-21 DIAGNOSIS — G8111 Spastic hemiplegia affecting right dominant side: Secondary | ICD-10-CM

## 2022-02-21 NOTE — Patient Instructions (Signed)
Return to clinic in 1 year.

## 2022-02-21 NOTE — Progress Notes (Signed)
Follow-up Visit   Date: 02/21/22    Christian Clements MRN: [id number] DOB: 02-Jul-1950   Interim History: Christian Clements is a 71 y.o. 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  The patient was accompanied to the clinic by self.  IMPRESSION/PLAN: Right leg spasticity following CVA (2017)  - Continue baclofen 7m TID.  OK to take an extra dose as needed  - He is very compliant with stretching and exercises regularly 2.  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 Cardioembolic source was suspected given the involvement of 2 vascular territories, however cardiac monitoring was normal.   If he has any new events, he will need implantable loop recorder  - Continue aspirin 325 mg, Lipitor 20 mg, and BP medication as per primary  Return to clinic in 1 year   -------------------------------------- UPDATE 02/21/2022:  There has been no significant change in his right leg stiffness.  He has good adherence and tolerability of baclofen 144mTID.  He has skipped the afternoon dose a few times and did not notice any change in symptoms.  He remains active and goes to the gym regularly.  No new complaints.   Medications:  Current Outpatient Medications on File Prior to Visit  Medication Sig Dispense Refill   amLODipine-valsartan (EXFORGE) 5-320 MG tablet Take 1 tablet by mouth daily. 90 tablet 1   aspirin 325 MG tablet Take 1 tablet (325 mg total) by mouth daily. 100 tablet 3   atorvastatin (LIPITOR) 20 MG tablet TAKE 1 TABLET BY MOUTH EVERY DAY 90 tablet 3   b complex vitamins tablet Take 1 tablet by mouth daily. 100 tablet 3   baclofen (LIORESAL) 10 MG tablet Take 1 tablet (10 mg total) by mouth 3 (three) times  daily. OK to take extra dose as needed 300 tablet 3   blood glucose meter kit and supplies KIT Dispense based on patient and insurance preference. Use two times daily as directed. (FOR ICD-9 250.00, 250.01). 1 each 0   Cholecalciferol 1000 UNITS tablet Take 1,000 Units by mouth daily.     docusate sodium (COLACE) 100 MG capsule Take 1 capsule (100 mg total) by mouth 2 (two) times daily. 60 capsule 11   doxazosin (CARDURA) 2 MG tablet TAKE 1 TABLET BY MOUTH EVERY DAY 90 tablet 3   glucose blood (ONETOUCH VERIO) test strip Use to check blood sugars twice a day Dx E11.9 Yearly physical w/labs are due must see MD for refills 100 each 0   metFORMIN (GLUCOPHAGE-XR) 500 MG 24 hr tablet TAKE 4 TABLETS (2,000 MG TOTAL) BY MOUTH DAILY 360 tablet 3   pantoprazole (PROTONIX) 40 MG tablet TAKE 1 TABLET BY MOUTH EVERY DAY 90 tablet 1   Psyllium (METAMUCIL PO) Take by mouth 2 (two) times a day.     Semaglutide (RYBELSUS) 7 MG TABS Take 7 mg by mouth daily. 90 tablet 3   sildenafil (VIAGRA) 100 MG tablet Take 1 tablet (100 mg total) by mouth daily as needed. 10 tablet 11   triamcinolone cream (KENALOG) 0.1 % APPLY TO AFFECTED AREA TWICE A DAY 60 g 3   zolpidem (AMBIEN) 10 MG tablet Take 0.5-1 tablets (5-10 mg total) by mouth at bedtime as needed for sleep. 30 tablet 1   No current facility-administered medications on file prior  to visit.    Allergies:  Allergies  Allergen Reactions   Accuretic [Quinapril-Hydrochlorothiazide]     Drug rash ?    Vital Signs:  BP (!) 149/84   Pulse (!) 59   Ht _0  (1.803 m)   Wt 217 lb (98.4 kg)   SpO2 97%   BMI 30.27 kg/m    Neurological Exam: MENTAL STATUS including orientation to time, place, person, recent and remote memory, attention span and concentration, language, and fund of knowledge is normal.  Speech is not dysarthric.  MOTOR:  Motor strength is 5/5 in all extremities, except right intrinsic hand muscles are 4+/5.  No atrophy, fasciculations or  abnormal movements.  No pronator drift.  Tone is 0+ in the RLE.    COORDINATION/GAIT:  Finger and toe tapping is slowed on the right and there is mild dysmetria on the right.   Gait shows mild spasticity in the right leg, stable and unassisted  Data: MRI/A brain 01/04/2016: Acute sub cm infarction at the ventral pontomedullary junction just to the left of midline. Small acute left frontal subcortical white matter infarction. These 2 acute infarctions could be coincidental in due to ordinary small vessel disease, but emboli from the heart or ascending aorta could also result in this. Moderate chronic small-vessel ischemic changes of the cerebral hemispheric white matter for a age. Negative intracranial MR angiography of the large and medium size vessels.   Cardiac monitor 04/06/2016:  No arrythmia   Echo 01/06/2016:  Left ventricle: The cavity size was normal. Wall thickness was   normal. Systolic function was normal. The estimated ejection fraction was in the range of 55% to 60%.  No defect or patent foramen ovale was identified.   US carotids 01/05/2016:  1-39% bilateral ICA   Lab Results  Component Value Date   HGBA1C 7.4 (H) 02/06/2022   Lab Results  Component Value Date   LDLCALC 21 08/27/2021      Thank you for allowing me to participate in patient's care.  If I can answer any additional questions, I would be pleased to do so.    Sincerely,    Henley Boettner K. Posey Pronto, DO

## 2022-03-12 NOTE — Progress Notes (Unsigned)
03/13/2022 Christian Clements [id number] Oct 02, 1950  Referring provider: Cassandria Anger, MD Primary GI doctor: Dr. Fuller Plan  ASSESSMENT AND PLAN:   Change in stool with bloating More constipation, most likely due to rybelsus as the time  - Increase fiber/ water intake, decrease caffeine, increase activity level. -Will add on Miralax daily and Benefiber - Due for colonoscopy will schedule with change in stool We have discussed the risks of bleeding, infection, perforation, medication reactions, and remote risk of death associated with colonoscopy. All questions were answered and the patient acknowledges these risk and wishes to proceeds  Gastroesophageal with dysphagia and bloating some darker stools and fatigue, previous H pylori treated 2020 without eradication study Dysphagia but not to food/pills, more likely dysmotility from GERD Lifestyle changes discussed, avoid NSAIDS, ETOH Will increase PPI to twice daily, emphasizing before food., Will schedule for EGD. , and Can consider gastroparesis study Check CBC and CMET, has not had CBC in a long time Will schedule EGD to evaluate for possible H. pylori, esophagitis, gastritis, peptic ulcer disease, etc.. I discussed risks of EGD with patient today, including risk of sedation, bleeding or perforation.  Patient provides understanding and gave verbal consent to proceed.  Type 2 diabetes mellitus with vascular disease ([place]) Discussed GLP1 with the patient, mechanism of action and how this can worsen and/or cause nausea by causing gastroparesis.   Discuss with primary care see about potentially getting off this medication. Gastroparesis diet given to the patient, consider GES  CVA, old, hemiparesis ([place]) On 325 mg ASA only  DOE Still works out, no chest pain, no palpations Some fatigue, check CBC, I have some concern for anemia,may need done sooner  Patient Care Team: Plotnikov, Evie Lacks, MD as PCP - General Renato Shin,  MD (Inactive) as Consulting Physician (Endocrinology) Alda Berthold, DO as Consulting Physician (Neurology) Paralee Cancel, MD as Consulting Physician (Orthopedic Surgery)  HISTORY OF PRESENT ILLNESS: 71 y.o. 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   11/2000 Dr. Fuller Plan colonoscopy with internal hemorrhoids otherwise unremarkable.  Recall 10 years.  Has not been completed. 09/01/2018 endoscopy Dr. Fuller Plan for dysphagia and GERD esophageal plaques suspicious for candidiasis, no esophageal abnormality to explain dysphagia, esophagus dilated, gastritis, duodenal stenosis candidal esophagitis confirmed with biopsy prescribe Diflucan, H. pylori gastritis given Pylera.  I do not see eradication study. 02/06/2022 labs are kidney and liver, A1c 7.4.  No CBC  He states his energy level is poor. He works out 5-6 x a week but has felt "old" last 6 months.  He has had some SOB with exertion but no chest pain, palpitations. Just went swimming this AM and running with less energy but no issues.  He has had some dysphagia and GERD more than usual. After eating will feel acid into his throat with throat tightness and trouble with saliva, never with food/pills. Never choking on liquids.  No NSAIDS, ETOH 1-2 x a week, a beer or mixed drink. No smoking, no drugs. He is on 325 mg ASA.  He is on protonix daily.  No AB pain but feels bloated and tightness/stiff even without eating anything. Denies early satiety.  Has hiccups, no nausea, vomiting.  No weight loss.  No nausea or vomiting.  Stool is dark and more constipation in the last 4-5 months.  He is on MVIT with iron, no pepto.  Has BM daily but has been harder, no hematochezia.  He is on rybelsus, has been on for a year.   He  reports that he has quit smoking. He has never  used smokeless tobacco. He reports current alcohol use of about 3.0 standard drinks of alcohol per week. He reports that he does not use drugs.  Current Medications:   Current Outpatient Medications (Endocrine & Metabolic):    metFORMIN (GLUCOPHAGE-XR) 500 MG 24 hr tablet, TAKE 4 TABLETS (2,000 MG TOTAL) BY MOUTH DAILY   Semaglutide (RYBELSUS) 7 MG TABS, Take 7 mg by mouth daily.  Current Outpatient Medications (Cardiovascular):    amLODipine-valsartan (EXFORGE) 5-320 MG tablet, Take 1 tablet by mouth daily.   atorvastatin (LIPITOR) 20 MG tablet, TAKE 1 TABLET BY MOUTH EVERY DAY   doxazosin (CARDURA) 2 MG tablet, TAKE 1 TABLET BY MOUTH EVERY DAY   sildenafil (VIAGRA) 100 MG tablet, Take 1 tablet (100 mg total) by mouth daily as needed.   Current Outpatient Medications (Analgesics):    aspirin 325 MG tablet, Take 1 tablet (325 mg total) by mouth daily.   Current Outpatient Medications (Other):    b complex vitamins tablet, Take 1 tablet by mouth daily.   baclofen (LIORESAL) 10 MG tablet, Take 1 tablet (10 mg total) by mouth 3 (three) times daily. OK to take extra dose as needed   blood glucose meter kit and supplies KIT, Dispense based on patient and insurance preference. Use two times daily as directed. (FOR ICD-9 250.00, 250.01).   Cholecalciferol 1000 UNITS tablet, Take 1,000 Units by mouth daily.   docusate sodium (COLACE) 100 MG capsule, Take 1 capsule (100 mg total) by mouth 2 (two) times daily.   glucose blood (ONETOUCH VERIO) test strip, Use to check blood sugars twice a day Dx E11.9 Yearly physical w/labs are due must see MD for refills   pantoprazole (PROTONIX) 40 MG tablet, TAKE 1 TABLET BY MOUTH EVERY DAY   Psyllium (METAMUCIL PO), Take by mouth 2 (two) times a day.   triamcinolone cream (KENALOG) 0.1 %, APPLY TO AFFECTED AREA TWICE A DAY   zolpidem (AMBIEN) 10 MG tablet, Take 0.5-1 tablets (5-10 mg total) by mouth at bedtime as needed for sleep.  Medical History:  Past  Medical History:  Diagnosis Date   Diabetes mellitus    Hypertension    Prostatitis 2011   Stroke [facility])    Allergies:  Allergies  Allergen Reactions   Accuretic [Quinapril-Hydrochlorothiazide]     Drug rash ?     Surgical History:  He  has a past surgical history that includes None and Colonoscopy. Family History:  His family history includes Cancer in his brother; Cancer (age of onset: 81) in his mother; Diabetes in his maternal grandfather; HIV/AIDS in his brother; Hypertension in an other family member; Stroke in his sister.  REVIEW OF SYSTEMS  : All other systems reviewed and negative except where noted in the History of Present Illness.  PHYSICAL EXAM: BP (!) 124/92   Pulse 76   Ht _0  (1.778 m)   Wt 215 lb 4 oz (97.6 kg)   BMI 30.89 kg/m  General:   Pleasant, well developed male in no acute distress Head:   Normocephalic and atraumatic. Eyes:  sclerae anicteric,conjunctive pink  Heart:   regular rate and rhythm Pulm:  Clear anteriorly; no wheezing Abdomen:   Soft, Obese AB, Active bowel sounds. No tenderness . Without guarding and Without rebound, No organomegaly appreciated. Rectal: Not evaluated Extremities:  Without edema. Msk: Symmetrical without gross deformities. Peripheral pulses intact.  Neurologic:  Alert and  oriented x4;  No focal deficits.  Skin:   Dry and intact without significant lesions or rashes. Psychiatric:  Cooperative. Normal mood and affect.  RELEVANT LABS AND IMAGING: CBC    Component Value Date/Time   WBC 7.2 02/10/2020 1210   RBC 4.60 02/10/2020 1210   HGB 14.6 02/10/2020 1210   HCT 42.8 02/10/2020 1210   PLT 289.0 02/10/2020 1210   MCV 93.2 02/10/2020 1210   MCH 31.3 01/05/2016 0244   MCHC 34.0 02/10/2020 1210   RDW 13.7 02/10/2020 1210   LYMPHSABS 2.7 02/10/2020 1210   MONOABS 0.4 02/10/2020 1210   EOSABS 0.1 02/10/2020 1210   BASOSABS 0.1 02/10/2020 1210    CMP     Component Value Date/Time   NA 141 02/06/2022 1146    K 4.1 02/06/2022 1146   CL 105 02/06/2022 1146   CO2 26 02/06/2022 1146   GLUCOSE 147 (H) 02/06/2022 1146   BUN 13 02/06/2022 1146   CREATININE 1.02 02/06/2022 1146   CALCIUM 9.5 02/06/2022 1146   PROT 7.8 12/17/2021 0901   ALBUMIN 4.7 12/17/2021 0901   AST 18 12/17/2021 0901   ALT 27 12/17/2021 0901   ALKPHOS 62 12/17/2021 0901   BILITOT 0.8 12/17/2021 0901   GFRNONAA >60 01/06/2016 0213   GFRAA >60 01/06/2016 0213     Vladimir Crofts, PA-C 10:03 AM

## 2022-03-13 ENCOUNTER — Other Ambulatory Visit: Payer: Self-pay | Admitting: Physician Assistant

## 2022-03-13 ENCOUNTER — Ambulatory Visit: Payer: Medicare PPO | Admitting: Physician Assistant

## 2022-03-13 ENCOUNTER — Other Ambulatory Visit (INDEPENDENT_AMBULATORY_CARE_PROVIDER_SITE_OTHER): Payer: Medicare PPO

## 2022-03-13 ENCOUNTER — Encounter: Payer: Self-pay | Admitting: Physician Assistant

## 2022-03-13 VITALS — BP 124/92 | HR 76 | Ht 70.0 in | Wt 215.2 lb

## 2022-03-13 DIAGNOSIS — R140 Abdominal distension (gaseous): Secondary | ICD-10-CM | POA: Diagnosis not present

## 2022-03-13 DIAGNOSIS — R195 Other fecal abnormalities: Secondary | ICD-10-CM

## 2022-03-13 DIAGNOSIS — R1310 Dysphagia, unspecified: Secondary | ICD-10-CM

## 2022-03-13 DIAGNOSIS — I69359 Hemiplegia and hemiparesis following cerebral infarction affecting unspecified side: Secondary | ICD-10-CM

## 2022-03-13 DIAGNOSIS — K219 Gastro-esophageal reflux disease without esophagitis: Secondary | ICD-10-CM | POA: Diagnosis not present

## 2022-03-13 DIAGNOSIS — E1159 Type 2 diabetes mellitus with other circulatory complications: Secondary | ICD-10-CM

## 2022-03-13 LAB — COMPREHENSIVE METABOLIC PANEL
ALT: 32 U/L (ref 0–53)
AST: 22 U/L (ref 0–37)
Albumin: 4.9 g/dL (ref 3.5–5.2)
Alkaline Phosphatase: 80 U/L (ref 39–117)
BUN: 10 mg/dL (ref 6–23)
CO2: 22 mEq/L (ref 19–32)
Calcium: 9.8 mg/dL (ref 8.4–10.5)
Chloride: 105 mEq/L (ref 96–112)
Creatinine, Ser: 0.91 mg/dL (ref 0.40–1.50)
GFR: 84.51 mL/min (ref >60.00)
Glucose, Bld: 127 mg/dL — ABNORMAL HIGH (ref 70–99)
Potassium: 4 mEq/L (ref 3.5–5.1)
Sodium: 141 mEq/L (ref 135–145)
Total Bilirubin: 0.7 mg/dL (ref 0.2–1.2)
Total Protein: 8 g/dL (ref 6.0–8.3)

## 2022-03-13 LAB — CBC WITH DIFFERENTIAL/PLATELET
Basophils Absolute: 0 10*3/uL (ref 0.0–0.1)
Basophils Relative: 0.5 % (ref 0.0–3.0)
Eosinophils Absolute: 0.4 10*3/uL (ref 0.0–0.7)
Eosinophils Relative: 4.4 % (ref 0.0–5.0)
HCT: 45.3 % (ref 39.0–52.0)
Hemoglobin: 15.6 g/dL (ref 13.0–17.0)
Lymphocytes Relative: 34.3 % (ref 12.0–46.0)
Lymphs Abs: 2.8 10*3/uL (ref 0.7–4.0)
MCHC: 34.4 g/dL (ref 30.0–36.0)
MCV: 92.9 fl (ref 78.0–100.0)
Monocytes Absolute: 0.5 10*3/uL (ref 0.1–1.0)
Monocytes Relative: 6.1 % (ref 3.0–12.0)
Neutro Abs: 4.5 10*3/uL (ref 1.4–7.7)
Neutrophils Relative %: 54.7 % (ref 43.0–77.0)
Platelets: 305 10*3/uL (ref 150.0–400.0)
RBC: 4.88 Mil/uL (ref 4.22–5.81)
RDW: 13.3 % (ref 11.5–15.5)
WBC: 8.3 10*3/uL (ref 4.0–10.5)

## 2022-03-13 MED ORDER — NA SULFATE-K SULFATE-MG SULF 17.5-3.13-1.6 GM/177ML PO SOLN: 1.0000 | Freq: Once | ORAL | 0 refills | Status: DC

## 2022-03-13 NOTE — Patient Instructions (Addendum)
You have been scheduled for an endoscopy and colonoscopy. Please follow the written instructions given to you at your visit today. Please pick up your prep supplies at the pharmacy within the next 1-3 days. If you use inhalers (even only as needed), please bring them with you on the day of your procedure.   Your provider has requested that you go to the basement level for lab work before leaving today. Press "B" on the elevator. The lab is located at the first door on the left as you exit the elevator.  Please take your proton pump inhibitor medication, pantoprazole twice a day  Please take this medication 30 minutes to 1 hour before meals- this makes it more effective.  Avoid spicy and acidic foods Avoid fatty foods Limit your intake of coffee, tea, alcohol, and carbonated drinks Work to maintain a healthy weight Keep the head of the bed elevated at least 3 inches with blocks or a wedge pillow if you are having any nighttime symptoms Stay upright for 2 hours after eating Avoid meals and snacks three to four hours before bedtime   Miralax is an osmotic laxative.  It only brings more water into the stool.  This is safe to take daily.  Can take up to 17 gram of miralax twice a day.  Mix with juice or coffee.  Start 1 capful at night for 3-4 days and reassess your response in 3-4 days.  You can increase and decrease the dose based on your response.  Remember, it can take up to 3-4 days to take effect OR for the effects to wear off.   I often pair this with benefiber in the morning to help assure the stool is not too loose.   Gastroparesis Please do small frequent meals like 4-6 meals a day.  Eat and drink liquids at separate times.  Avoid high fiber foods, cook your vegetables, avoid high fat food.  Suggest spreading protein throughout the day (greek yogurt, glucerna, soft meat, milk, eggs) Choose soft foods that you can mash with a fork When you are more symptomatic, change to pureed  foods foods and liquids.  Consider reading "Living well with Gastroparesis" by Lambert Keto Gastroparesis is a condition in which food takes longer than normal to empty from the stomach. This condition is also known as delayed gastric emptying. It is usually a long-term (chronic) condition. There is no cure, but there are treatments and things that you can do at home to help relieve symptoms. Treating the underlying condition that causes gastroparesis can also help relieve symptoms What are the causes? In many cases, the cause of this condition is not known. Possible causes include: A hormone (endocrine) disorder, such as hypothyroidism or diabetes. A nervous system disease, such as Parkinson's disease or multiple sclerosis. Cancer, infection, or surgery that affects the stomach or vagus nerve. The vagus nerve runs from your chest, through your neck, and to the lower part of your brain. A connective tissue disorder, such as scleroderma. Certain medicines. What increases the risk? You are more likely to develop this condition if: You have certain disorders or diseases. These may include: An endocrine disorder. An eating disorder. Amyloidosis. Scleroderma. Parkinson's disease. Multiple sclerosis. Cancer or infection of the stomach or the vagus nerve. You have had surgery on your stomach or vagus nerve. You take certain medicines. You are male. What are the signs or symptoms? Symptoms of this condition include: Feeling full after eating very little or a loss of appetite.  Nausea, vomiting, or heartburn. Bloating of your abdomen. Inconsistent blood sugar (glucose) levels on blood tests. Unexplained weight loss. Acid from the stomach coming up into the esophagus (gastroesophageal reflux). Sudden tightening (spasm) of the stomach, which can be painful. Symptoms may come and go. Some people may not notice any symptoms. How is this diagnosed? This condition is diagnosed with tests,  such as: Tests that check how long it takes food to move through the stomach and intestines. These tests include: Upper gastrointestinal (GI) series. For this test, you drink a liquid that shows up well on X-rays, and then X-rays are taken of your intestines. Gastric emptying scintigraphy. For this test, you eat food that contains a small amount of radioactive material, and then scans are taken. Wireless capsule GI monitoring system. For this test, you swallow a pill (capsule) that records information about how foods and fluid move through your stomach. Gastric manometry. For this test, a tube is passed down your throat and into your stomach to measure electrical and muscular activity. Endoscopy. For this test, a long, thin tube with a camera and light on the end is passed down your throat and into your stomach to check for problems in your stomach lining. Ultrasound. This test uses sound waves to create images of the inside of your body. This can help rule out gallbladder disease or pancreatitis as a cause of your symptoms. How is this treated? There is no cure for this condition, but treatment and home care may relieve symptoms. Treatment may include: Treating the underlying cause. Managing your symptoms by making changes to your diet and exercise habits. Taking medicines to control nausea and vomiting and to stimulate stomach muscles. Getting food through a feeding tube in the hospital. This may be done in severe cases. Having surgery to insert a device called a gastric electrical stimulator into your body. This device helps improve stomach emptying and control nausea and vomiting. Follow these instructions at home: Take over-the-counter and prescription medicines only as told by your health care provider. Follow instructions from your health care provider about eating or drinking restrictions. Your health care provider may recommend that you: Eat smaller meals more often. Eat low-fat  foods. Eat low-fiber forms of high-fiber foods. For example, eat cooked vegetables instead of raw vegetables. Have only liquid foods instead of solid foods. Liquid foods are easier to digest. Drink enough fluid to keep your urine pale yellow. Exercise as often as told by your health care provider. Keep all follow-up visits. This is important. Contact a health care provider if you: Notice that your symptoms do not improve with treatment. Have new symptoms. Get help right away if you: Have severe pain in your abdomen that does not improve with treatment. Have nausea that is severe or does not go away. Vomit every time you drink fluids. Summary Gastroparesis is a long-term (chronic) condition in which food takes longer than normal to empty from the stomach. Symptoms include nausea, vomiting, heartburn, bloating of your abdomen, and loss of appetite. Eating smaller portions, low-fat foods, and low-fiber forms of high-fiber foods may help you manage your symptoms. Get help right away if you have severe pain in your abdomen. This information is not intended to replace advice given to you by your health care provider. Make sure you discuss any questions you have with your health care provider. Document Revised: 07/18/2019 Document Reviewed: 07/18/2019 Elsevier Patient Education  2021 [employer].

## 2022-03-25 ENCOUNTER — Encounter: Payer: Self-pay | Admitting: Gastroenterology

## 2022-03-27 ENCOUNTER — Other Ambulatory Visit: Payer: Self-pay | Admitting: Physician Assistant

## 2022-03-28 ENCOUNTER — Telehealth: Payer: Self-pay | Admitting: Physician Assistant

## 2022-03-28 ENCOUNTER — Other Ambulatory Visit: Payer: Self-pay

## 2022-03-28 MED ORDER — NA SULFATE-K SULFATE-MG SULF 17.5-3.13-1.6 GM/177ML PO SOLN: 1.0000 | Freq: Once | ORAL | 0 refills | Status: AC

## 2022-03-28 NOTE — Telephone Encounter (Signed)
Prep - Suprep sent to patient's pharmacy. Informed the patient's wife.

## 2022-03-28 NOTE — Telephone Encounter (Signed)
Patient states prep is not at the pharmacy. Please advise.

## 2022-03-31 DIAGNOSIS — D122 Benign neoplasm of ascending colon: Secondary | ICD-10-CM | POA: Diagnosis not present

## 2022-03-31 DIAGNOSIS — R194 Change in bowel habit: Secondary | ICD-10-CM | POA: Diagnosis not present

## 2022-03-31 DIAGNOSIS — K2960 Other gastritis without bleeding: Secondary | ICD-10-CM | POA: Diagnosis not present

## 2022-03-31 DIAGNOSIS — R1310 Dysphagia, unspecified: Secondary | ICD-10-CM | POA: Diagnosis not present

## 2022-03-31 DIAGNOSIS — D125 Benign neoplasm of sigmoid colon: Secondary | ICD-10-CM | POA: Diagnosis not present

## 2022-03-31 DIAGNOSIS — K219 Gastro-esophageal reflux disease without esophagitis: Secondary | ICD-10-CM | POA: Diagnosis not present

## 2022-03-31 DIAGNOSIS — D123 Benign neoplasm of transverse colon: Secondary | ICD-10-CM | POA: Diagnosis not present

## 2022-04-01 ENCOUNTER — Ambulatory Visit (AMBULATORY_SURGERY_CENTER): Payer: Medicare PPO | Admitting: Gastroenterology

## 2022-04-01 ENCOUNTER — Encounter: Payer: Self-pay | Admitting: Gastroenterology

## 2022-04-01 VITALS — BP 132/73 | HR 63 | Temp 98.6°F | Resp 14 | Ht 70.0 in | Wt 215.0 lb

## 2022-04-01 DIAGNOSIS — D123 Benign neoplasm of transverse colon: Secondary | ICD-10-CM | POA: Diagnosis not present

## 2022-04-01 DIAGNOSIS — R1310 Dysphagia, unspecified: Secondary | ICD-10-CM

## 2022-04-01 DIAGNOSIS — K219 Gastro-esophageal reflux disease without esophagitis: Secondary | ICD-10-CM | POA: Diagnosis not present

## 2022-04-01 DIAGNOSIS — D125 Benign neoplasm of sigmoid colon: Secondary | ICD-10-CM

## 2022-04-01 DIAGNOSIS — R194 Change in bowel habit: Secondary | ICD-10-CM | POA: Diagnosis not present

## 2022-04-01 DIAGNOSIS — K2960 Other gastritis without bleeding: Secondary | ICD-10-CM | POA: Diagnosis not present

## 2022-04-01 DIAGNOSIS — D122 Benign neoplasm of ascending colon: Secondary | ICD-10-CM | POA: Diagnosis not present

## 2022-04-01 MED ORDER — SODIUM CHLORIDE 0.9 % IV SOLN: 500.0000 mL | Freq: Once | INTRAVENOUS | Status: DC

## 2022-04-01 MED ORDER — PANTOPRAZOLE SODIUM 40 MG PO TBEC: DELAYED_RELEASE_TABLET | ORAL | 3 refills | Status: DC

## 2022-04-01 NOTE — Op Note (Signed)
[facility] Patient Name: Christian Clements Procedure Date: 04/01/2022 2:48 PM MRN: [id number] Endoscopist: Ladene Artist , MD, [id number] Age: 72 Referring MD:  Date of Birth: 04-08-1950 Gender: Male Account #: [id number] Procedure:                Upper GI endoscopy Indications:              Dysphagia, Gastroesophageal reflux disease Medicines:                Monitored Anesthesia Care Procedure:                Pre-Anesthesia Assessment:                           - Prior to the procedure, a History and Physical                            was performed, and patient medications and                            allergies were reviewed. The patient's tolerance of                            previous anesthesia was also reviewed. The risks                            and benefits of the procedure and the sedation                            options and risks were discussed with the patient.                            All questions were answered, and informed consent                            was obtained. Prior Anticoagulants: The patient has                            taken no anticoagulant or antiplatelet agents. ASA                            Grade Assessment: III - A patient with severe                            systemic disease. After reviewing the risks and                            benefits, the patient was deemed in satisfactory                            condition to undergo the procedure.                           After obtaining informed consent, the endoscope was  passed under direct vision. Throughout the                            procedure, the patient's blood pressure, pulse, and                            oxygen saturations were monitored continuously. The                            GIF [id number] #[id number] was introduced through the                            mouth, and advanced to the second part of duodenum.                            The  upper GI endoscopy was accomplished without                            difficulty. The patient tolerated the procedure                            well. Scope In: Scope Out: Findings:                 LA Grade A (one or more mucosal breaks less than 5                            mm, not extending between tops of 2 mucosal folds)                            esophagitis with no bleeding was found at the                            gastroesophageal junction.                           No endoscopic abnormality was evident in the                            esophagus to explain the patient's complaint of                            dysphagia. It was decided, however, to proceed with                            dilation of the entire esophagus. A guidewire was                            placed and the scope was withdrawn. Dilation was                            performed with a Savary dilator with no resistance  at 17 mm.                           The exam of the esophagus was otherwise normal.                           A few localized small erosions with no bleeding and                            no stigmata of recent bleeding were found in the                            prepyloric region of the stomach. Biopsies were                            taken with a cold forceps for histology.                           Patchy mildly erythematous mucosa without bleeding                            was found in the gastric body. Biopsies were taken                            with a cold forceps for histology.                           The exam of the stomach was otherwise normal.                           An acquired benign-appearing, intrinsic mild                            stenosis was found in the duodenal bulb and was                            traversed.                           The exam of the duodenum was otherwise normal. Complications:            No immediate  complications. Estimated Blood Loss:     Estimated blood loss was minimal. Impression:               - LA Grade A reflux esophagitis with no bleeding.                           - No endoscopic esophageal abnormality to explain                            patient's dysphagia. Esophagus dilated.                           - Erosive gastropathy with no bleeding and no  stigmata of recent bleeding. Biopsied.                           - Erythematous mucosa in the gastric body. Biopsied.                           - Acquired duodenal stenosis. Recommendation:           - Patient has a contact number available for                            emergencies. The signs and symptoms of potential                            delayed complications were discussed with the                            patient. Return to normal activities tomorrow.                            Written discharge instructions were provided to the                            patient.                           - Clear liquid diet for 2 hours, then advance as                            tolerated to soft diet today.                           - Resume prior diet tomorrow.                           - Follow antireflux measures long term.                           - Continue present medications.                           - Pantoprazole 40 mg po bid for 2 months then 40 mg                            po qd long term, 1 year of refills.                           - Await pathology results. Ladene Artist, MD 04/01/2022 3:45:46 PM This report has been signed electronically.

## 2022-04-01 NOTE — Progress Notes (Signed)
Called to room to assist during endoscopic procedure.  Patient ID and intended procedure confirmed with present staff. Received instructions for my participation in the procedure from the performing physician.  

## 2022-04-01 NOTE — Patient Instructions (Signed)
Start Protonix   YOU HAD AN ENDOSCOPIC PROCEDURE TODAY AT [facility]:   Refer to the procedure report that was given to you for any specific questions about what was found during the examination.  If the procedure report does not answer your questions, please call your gastroenterologist to clarify.  If you requested that your care partner not be given the details of your procedure findings, then the procedure report has been included in a sealed envelope for you to review at your convenience later.  YOU SHOULD EXPECT: Some feelings of bloating in the abdomen. Passage of more gas than usual.  Walking can help get rid of the air that was put into your GI tract during the procedure and reduce the bloating. If you had a lower endoscopy (such as a colonoscopy or flexible sigmoidoscopy) you may notice spotting of blood in your stool or on the toilet paper. If you underwent a bowel prep for your procedure, you may not have a normal bowel movement for a few days.  Please Note:  You might notice some irritation and congestion in your nose or some drainage.  This is from the oxygen used during your procedure.  There is no need for concern and it should clear up in a day or so.  SYMPTOMS TO REPORT IMMEDIATELY:  Following lower endoscopy (colonoscopy or flexible sigmoidoscopy):  Excessive amounts of blood in the stool  Significant tenderness or worsening of abdominal pains  Swelling of the abdomen that is new, acute  Fever of 100F or higher  Following upper endoscopy (EGD)  Vomiting of blood or coffee ground material  New chest pain or pain under the shoulder blades  Painful or persistently difficult swallowing  New shortness of breath  Fever of 100F or higher  Black, tarry-looking stools  For urgent or emergent issues, a gastroenterologist can be reached at any hour by calling [phone]. Do not use MyChart messaging for urgent concerns.    DIET:  Dilation diet today (see  handout), but then you may proceed to your regular diet tomorrow as tolerated.  Drink plenty of fluids but you should avoid alcoholic beverages for 24 hours.  ACTIVITY:  You should plan to take it easy for the rest of today and you should NOT DRIVE or use heavy machinery until tomorrow (because of the sedation medicines used during the test).    FOLLOW UP: Our staff will call the number listed on your records the next business day following your procedure.  We will call around 7:15- 8:00 am to check on you and address any questions or concerns that you may have regarding the information given to you following your procedure. If we do not reach you, we will leave a message.     If any biopsies were taken you will be contacted by phone or by letter within the next 1-3 weeks.  Please call us at [phone] if you have not heard about the biopsies in 3 weeks.    SIGNATURES/CONFIDENTIALITY: You and/or your care partner have signed paperwork which will be entered into your electronic medical record.  These signatures attest to the fact that that the information above on your After Visit Summary has been reviewed and is understood.  Full responsibility of the confidentiality of this discharge information lies with you and/or your care-partner.

## 2022-04-01 NOTE — Progress Notes (Signed)
A and O x3. Report to RN. Tolerated MAC anesthesia well.Teeth unchanged after procedure. 

## 2022-04-01 NOTE — Op Note (Signed)
Christian Clements Procedure Date: 04/01/2022 2:51 PM MRN: [id number] Endoscopist: Ladene Artist , MD, [id number] Age: 72 Referring MD:  Date of Birth: January 08, 1951 Gender: Male Account #: [id number] Procedure:                Colonoscopy Indications:              Change in bowel habits Medicines:                Monitored Anesthesia Care Procedure:                Pre-Anesthesia Assessment:                           - Prior to the procedure, a History and Physical                            was performed, and patient medications and                            allergies were reviewed. The patient's tolerance of                            previous anesthesia was also reviewed. The risks                            and benefits of the procedure and the sedation                            options and risks were discussed with the patient.                            All questions were answered, and informed consent                            was obtained. Prior Anticoagulants: The patient has                            taken no anticoagulant or antiplatelet agents. ASA                            Grade Assessment: III - A patient with severe                            systemic disease. After reviewing the risks and                            benefits, the patient was deemed in satisfactory                            condition to undergo the procedure.                           After obtaining informed consent, the colonoscope  was passed under direct vision. Throughout the                            procedure, the patient's blood pressure, pulse, and                            oxygen saturations were monitored continuously. The                            CF [id number] #[id number] was introduced through the anus                            and advanced to the the cecum, identified by                            appendiceal orifice and ileocecal  valve. The                            ileocecal valve, appendiceal orifice, and rectum                            were photographed. The quality of the bowel                            preparation was adequate after extensive lavage,                            suction. The colonoscopy was performed without                            difficulty. The patient tolerated the procedure                            well. Scope In: 3:06:47 PM Scope Out: 3:29:10 PM Scope Withdrawal Time: 0 hours 16 minutes 47 seconds  Total Procedure Duration: 0 hours 22 minutes 23 seconds  Findings:                 The perianal and digital rectal examinations were                            normal.                           Two sessile polyps were found in the hepatic                            flexure and ascending colon. The polyps were 3 mm                            in size. These polyps were removed with a cold                            biopsy forceps. Resection and retrieval were  complete.                           Two sessile polyps were found in the sigmoid colon                            and transverse colon. The polyps were 7 to 8 mm in                            size. These polyps were removed with a cold snare.                            Resection and retrieval were complete.                           A diffuse area of mild melanosis was found in the                            entire colon.                           External and internal hemorrhoids were found during                            retroflexion. The hemorrhoids were moderate and                            Grade I (internal hemorrhoids that do not prolapse).                           The exam was otherwise without abnormality on                            direct and retroflexion views. Complications:            No immediate complications. Estimated blood loss:                            None. Estimated Blood  Loss:     Estimated blood loss: none. Impression:               - Two 3 mm polyps at the hepatic flexure and in the                            ascending colon, removed with a cold biopsy                            forceps. Resected and retrieved.                           - Two 7 to 8 mm polyps in the sigmoid colon and in                            the transverse colon, removed with a cold snare.  Resected and retrieved.                           - Melanosis in the colon.                           - External and internal hemorrhoids.                           - The examination was otherwise normal on direct                            and retroflexion views. Recommendation:           - Repeat colonoscopy vs no repeat due to age after                            studies are complete for surveillance based on                            pathology results.                           - Patient has a contact number available for                            emergencies. The signs and symptoms of potential                            delayed complications were discussed with the                            patient. Return to normal activities tomorrow.                            Written discharge instructions were provided to the                            patient.                           - Resume previous diet.                           - Continue present medications.                           - Miralax qd prn constipation.                           - Await pathology results. Ladene Artist, MD 04/01/2022 3:40:37 PM This report has been signed electronically.

## 2022-04-01 NOTE — Progress Notes (Signed)
See 03/13/2022 H&P, no changes

## 2022-04-01 NOTE — Progress Notes (Signed)
VS completed by DT.  Pt's states no medical or surgical changes since previsit or office visit.  

## 2022-04-02 ENCOUNTER — Telehealth: Payer: Self-pay

## 2022-04-02 NOTE — Telephone Encounter (Signed)
Follow up call placed, VM obtained and message left. 

## 2022-04-10 ENCOUNTER — Encounter: Payer: Self-pay | Admitting: Gastroenterology

## 2022-04-23 ENCOUNTER — Encounter: Payer: Self-pay | Admitting: Internal Medicine

## 2022-04-23 ENCOUNTER — Ambulatory Visit: Payer: Medicare PPO | Admitting: Internal Medicine

## 2022-04-23 VITALS — BP 120/72 | HR 60 | Temp 97.5°F | Ht 70.0 in | Wt 214.0 lb

## 2022-04-23 DIAGNOSIS — I69359 Hemiplegia and hemiparesis following cerebral infarction affecting unspecified side: Secondary | ICD-10-CM

## 2022-04-23 DIAGNOSIS — R5383 Other fatigue: Secondary | ICD-10-CM

## 2022-04-23 DIAGNOSIS — R972 Elevated prostate specific antigen [PSA]: Secondary | ICD-10-CM | POA: Diagnosis not present

## 2022-04-23 DIAGNOSIS — I152 Hypertension secondary to endocrine disorders: Secondary | ICD-10-CM

## 2022-04-23 DIAGNOSIS — E1159 Type 2 diabetes mellitus with other circulatory complications: Secondary | ICD-10-CM

## 2022-04-23 DIAGNOSIS — F5101 Primary insomnia: Secondary | ICD-10-CM

## 2022-04-23 LAB — COMPREHENSIVE METABOLIC PANEL
ALT: 27 U/L (ref 0–53)
AST: 18 U/L (ref 0–37)
Albumin: 4.7 g/dL (ref 3.5–5.2)
Alkaline Phosphatase: 64 U/L (ref 39–117)
BUN: 11 mg/dL (ref 6–23)
CO2: 25 mEq/L (ref 19–32)
Calcium: 9.5 mg/dL (ref 8.4–10.5)
Chloride: 104 mEq/L (ref 96–112)
Creatinine, Ser: 0.93 mg/dL (ref 0.40–1.50)
GFR: 82.26 mL/min (ref >60.00)
Glucose, Bld: 112 mg/dL — ABNORMAL HIGH (ref 70–99)
Potassium: 4 mEq/L (ref 3.5–5.1)
Sodium: 138 mEq/L (ref 135–145)
Total Bilirubin: 0.8 mg/dL (ref 0.2–1.2)
Total Protein: 7.6 g/dL (ref 6.0–8.3)

## 2022-04-23 LAB — HEMOGLOBIN A1C: Hgb A1c MFr Bld: 7.3 % — ABNORMAL HIGH (ref 4.6–6.5)

## 2022-04-23 NOTE — Assessment & Plan Note (Signed)
Exercise qod to improve fatigue Try Zolpidem Go to bed later

## 2022-04-23 NOTE — Assessment & Plan Note (Signed)
Cont on Exforge, Cardura

## 2022-04-23 NOTE — Assessment & Plan Note (Signed)
Repeat free PSA Pt cancelled Urology appt

## 2022-04-23 NOTE — Assessment & Plan Note (Signed)
Cont on Metformin, Rybelsus 7 mg/d

## 2022-04-23 NOTE — Assessment & Plan Note (Addendum)
Exercise qod to improve fatigue Check Carotid Doppler

## 2022-04-23 NOTE — Progress Notes (Signed)
Subjective:  Patient ID: Christian Clements, male    DOB: January 14, 1951  Age: 72 y.o. MRN: [id number]  CC: Follow-up   HPI Christian Clements presents for HTN, DM, dyslipidemia C/o fatigue. C/o insomnia - gets 5 h of sleep per night  Outpatient Medications Prior to Visit  Medication Sig Dispense Refill   amLODipine-valsartan (EXFORGE) 5-320 MG tablet Take 1 tablet by mouth daily. 90 tablet 1   aspirin 325 MG tablet Take 1 tablet (325 mg total) by mouth daily. 100 tablet 3   atorvastatin (LIPITOR) 20 MG tablet TAKE 1 TABLET BY MOUTH EVERY DAY 90 tablet 3   b complex vitamins tablet Take 1 tablet by mouth daily. 100 tablet 3   baclofen (LIORESAL) 10 MG tablet Take 1 tablet (10 mg total) by mouth 3 (three) times daily. OK to take extra dose as needed 300 tablet 3   blood glucose meter kit and supplies KIT Dispense based on patient and insurance preference. Use two times daily as directed. (FOR ICD-9 250.00, 250.01). 1 each 0   Cholecalciferol 1000 UNITS tablet Take 1,000 Units by mouth daily.     docusate sodium (COLACE) 100 MG capsule Take 1 capsule (100 mg total) by mouth 2 (two) times daily. 60 capsule 11   doxazosin (CARDURA) 2 MG tablet TAKE 1 TABLET BY MOUTH EVERY DAY 90 tablet 3   glucose blood (ONETOUCH VERIO) test strip Use to check blood sugars twice a day Dx E11.9 Yearly physical w/labs are due must see MD for refills 100 each 0   metFORMIN (GLUCOPHAGE-XR) 500 MG 24 hr tablet TAKE 4 TABLETS (2,000 MG TOTAL) BY MOUTH DAILY 360 tablet 3   pantoprazole (PROTONIX) 40 MG tablet TAKE 1 TABLET BY MOUTH EVERY DAY 90 tablet 1   pantoprazole (PROTONIX) 40 MG tablet 40 mg by mouth twice a day for two months and then 40 mg by mouth daily 90 tablet 3   Psyllium (METAMUCIL PO) Take by mouth 2 (two) times a day.     Semaglutide (RYBELSUS) 7 MG TABS Take 7 mg by mouth daily. 90 tablet 3   sildenafil (VIAGRA) 100 MG tablet Take 1 tablet (100 mg total) by mouth daily as needed. 10 tablet 11   Sod  Picosulfate-Mag Ox-Cit Acd (CLENPIQ) 10-3.5-12 MG-GM -GM/160ML SOLN Take 1 kit by mouth once for 1 dose. 354 mL 0   triamcinolone cream (KENALOG) 0.1 % APPLY TO AFFECTED AREA TWICE A DAY 60 g 3   zolpidem (AMBIEN) 10 MG tablet Take 0.5-1 tablets (5-10 mg total) by mouth at bedtime as needed for sleep. 30 tablet 1   Facility-Administered Medications Prior to Visit  Medication Dose Route Frequency Provider Last Rate Last Admin   0.9 %  sodium chloride infusion  500 mL Intravenous Once Ladene Artist, MD        ROS: Review of Systems  Constitutional:  Negative for appetite change, fatigue and unexpected weight change.  HENT:  Negative for congestion, nosebleeds, sneezing, sore throat and trouble swallowing.   Eyes:  Negative for itching and visual disturbance.  Respiratory:  Negative for cough.   Cardiovascular:  Negative for chest pain, palpitations and leg swelling.  Gastrointestinal:  Negative for abdominal distention, blood in stool, diarrhea and nausea.  Genitourinary:  Negative for frequency and hematuria.  Musculoskeletal:  Negative for back pain, gait problem, joint swelling and neck pain.  Skin:  Negative for rash.  Neurological:  Negative for dizziness, tremors, speech difficulty and weakness.  Psychiatric/Behavioral:  Positive  for sleep disturbance. Negative for agitation and dysphoric mood. The patient is not nervous/anxious.     Objective:  BP 120/72 (BP Location: Left Arm, Patient Position: Sitting, Cuff Size: Normal)   Pulse 60   Temp (!) 97.5 F (36.4 C) (Oral)   Ht '5\' 10"'$  (1.778 m)   Wt 214 lb (97.1 kg)   SpO2 98%   BMI 30.71 kg/m   BP Readings from Last 3 Encounters:  04/23/22 120/72  04/01/22 132/73  03/13/22 (!) 124/92    Wt Readings from Last 3 Encounters:  04/23/22 214 lb (97.1 kg)  04/01/22 215 lb (97.5 kg)  03/13/22 215 lb 4 oz (97.6 kg)    Physical Exam Constitutional:      General: He is not in acute distress.    Appearance: Normal  appearance. He is well-developed.     Comments: NAD  Eyes:     Conjunctiva/sclera: Conjunctivae normal.     Pupils: Pupils are equal, round, and reactive to light.  Neck:     Thyroid: No thyromegaly.     Vascular: No JVD.  Cardiovascular:     Rate and Rhythm: Normal rate and regular rhythm.     Heart sounds: Normal heart sounds. No murmur heard.    No friction rub. No gallop.  Pulmonary:     Effort: Pulmonary effort is normal. No respiratory distress.     Breath sounds: Normal breath sounds. No wheezing or rales.  Chest:     Chest wall: No tenderness.  Abdominal:     General: Bowel sounds are normal. There is no distension.     Palpations: Abdomen is soft. There is no mass.     Tenderness: There is no abdominal tenderness. There is no guarding or rebound.  Musculoskeletal:        General: No tenderness. Normal range of motion.     Cervical back: Normal range of motion.  Lymphadenopathy:     Cervical: No cervical adenopathy.  Skin:    General: Skin is warm and dry.     Findings: No rash.  Neurological:     Mental Status: He is alert and oriented to person, place, and time.     Cranial Nerves: No cranial nerve deficit.     Motor: No abnormal muscle tone.     Coordination: Coordination normal.     Gait: Gait abnormal.     Deep Tendon Reflexes: Reflexes are normal and symmetric.  Psychiatric:        Behavior: Behavior normal.        Thought Content: Thought content normal.        Judgment: Judgment normal.     Lab Results  Component Value Date   WBC 8.3 03/13/2022   HGB 15.6 03/13/2022   HCT 45.3 03/13/2022   PLT 305.0 03/13/2022   GLUCOSE 127 (H) 03/13/2022   CHOL 74 08/27/2021   TRIG 104.0 08/27/2021   HDL 32.90 (L) 08/27/2021   LDLDIRECT 30.1 11/14/2010   LDLCALC 21 08/27/2021   ALT 32 03/13/2022   AST 22 03/13/2022   NA 141 03/13/2022   K 4.0 03/13/2022   CL 105 03/13/2022   CREATININE 0.91 03/13/2022   BUN 10 03/13/2022   CO2 22 03/13/2022   TSH 3.03  08/27/2021   PSA 5.03 (H) 08/27/2021   INR 1.2 (H) 05/15/2016   HGBA1C 7.4 (H) 02/06/2022   MICROALBUR 4.0 (H) 02/11/2019    DG HIP UNILAT WITH PELVIS 2-3 VIEWS RIGHT  Result Date: 11/27/2016 CLINICAL  DATA:  Right hip pain EXAM: DG HIP (WITH OR WITHOUT PELVIS) 2-3V RIGHT COMPARISON:  10/27/2016 FINDINGS: There is no evidence of hip fracture or dislocation. There is no evidence of arthropathy or other focal bone abnormality. IMPRESSION: Negative. Electronically Signed   By: Franchot Gallo M.D.   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    Cont on Metformin, Rybelsus 7 mg/d      Relevant Orders   Comprehensive metabolic panel   Hemoglobin A1c     Nervous and Auditory   CVA, old, hemiparesis (HCC)    Exercise qod to improve fatigue Check Carotid Doppler      Relevant Orders   US Carotid Bilateral     Other   Elevated PSA    Repeat free PSA Pt cancelled Urology appt      Relevant Orders   PSA, total and free   Fatigue    Exercise qod to improve fatigue Try Zolpidem to help w/insomnia Go to bed late      Relevant Orders   Comprehensive metabolic panel   Insomnia    Exercise qod to improve fatigue Try Zolpidem Go to bed later         No orders of the defined types were placed in this encounter.     Follow-up: Return in about 3 months (around 07/22/2022) for a follow-up visit.  Walker Kehr, MD

## 2022-04-23 NOTE — Assessment & Plan Note (Signed)
Exercise qod to improve fatigue Try Zolpidem to help w/insomnia Go to bed late

## 2022-04-25 LAB — PSA, TOTAL AND FREE
PSA, % Free: 14 % (calc) — ABNORMAL LOW (ref >25)
PSA, Free: 1.1 ng/mL
PSA, Total: 7.7 ng/mL — ABNORMAL HIGH (ref <=4.0)

## 2022-05-09 ENCOUNTER — Other Ambulatory Visit (INDEPENDENT_AMBULATORY_CARE_PROVIDER_SITE_OTHER): Payer: Medicare PPO

## 2022-05-09 DIAGNOSIS — E1159 Type 2 diabetes mellitus with other circulatory complications: Secondary | ICD-10-CM

## 2022-05-09 LAB — BASIC METABOLIC PANEL
BUN: 12 mg/dL (ref 6–23)
CO2: 27 mEq/L (ref 19–32)
Calcium: 9.5 mg/dL (ref 8.4–10.5)
Chloride: 105 mEq/L (ref 96–112)
Creatinine, Ser: 0.96 mg/dL (ref 0.40–1.50)
GFR: 79.16 mL/min (ref >60.00)
Glucose, Bld: 122 mg/dL — ABNORMAL HIGH (ref 70–99)
Potassium: 4.5 mEq/L (ref 3.5–5.1)
Sodium: 139 mEq/L (ref 135–145)

## 2022-05-09 LAB — HEMOGLOBIN A1C: Hgb A1c MFr Bld: 7.3 % — ABNORMAL HIGH (ref 4.6–6.5)

## 2022-06-06 ENCOUNTER — Encounter: Payer: Self-pay | Admitting: Internal Medicine

## 2022-06-06 ENCOUNTER — Ambulatory Visit: Payer: Medicare PPO | Admitting: Internal Medicine

## 2022-06-06 VITALS — BP 128/72 | HR 62 | Ht 70.0 in | Wt 215.0 lb

## 2022-06-06 DIAGNOSIS — E1142 Type 2 diabetes mellitus with diabetic polyneuropathy: Secondary | ICD-10-CM

## 2022-06-06 DIAGNOSIS — E1159 Type 2 diabetes mellitus with other circulatory complications: Secondary | ICD-10-CM

## 2022-06-06 LAB — POCT GLUCOSE (DEVICE FOR HOME USE): POC Glucose: 151 mg/dl — AB (ref 70–99)

## 2022-06-06 MED ORDER — RYBELSUS 7 MG PO TABS: 7.0000 mg | ORAL_TABLET | Freq: Every day | ORAL | 3 refills | Status: DC

## 2022-06-06 MED ORDER — METFORMIN HCL ER 500 MG PO TB24: 2000.0000 mg | ORAL_TABLET | Freq: Every day | ORAL | 3 refills | Status: DC

## 2022-06-06 NOTE — Progress Notes (Signed)
Name: Christian Clements  Age/ Sex: 72 y.o., male   MRN/ DOB: [id number], Mar 26, 1950     PCP: Cassandria Anger, MD   Reason for Endocrinology Evaluation: Type 2 Diabetes Mellitus  Initial Endocrine Consultative Visit: 02/12/2016    PATIENT IDENTIFIER: Christian Clements is a 72 y.o. male with a past medical history of DM, CVA, BPH and HTN. The patient has followed with Endocrinology clinic since 02/12/2016 for consultative assistance with management of his diabetes.  DIABETIC HISTORY:  Christian Clements was diagnosed with DM 2006, he took insulin for 1 month in 2017. His hemoglobin A1c has ranged from 5.9% in 2020, peaking at 7.9% in 2023.   SUBJECTIVE:   During the last visit (06/11/2021): Saw Christian Clements  Today (06/06/2022): Christian Clements  is here for diabetes management.  He checks his blood sugars occasionally .The patient has not had hypoglycemic episodes since the last clinic visit.  Weight stable  He has been snacking more than usual over the past 6 months  Denies nausea, vomiting  Denies constipation or diarrhea     HOME DIABETES REGIMEN:  Metformin 500 mg,4 tabs daily  Rybelsus 7 mg daily      Statin: Yes ACE-I/ARB: No    METER DOWNLOAD SUMMARY: n/a    DIABETIC COMPLICATIONS: Microvascular complications:  Neuropathy Denies: CKD Last Eye Exam: Completed 2023  Macrovascular complications:  CVA Denies: CAD,  PVD   HISTORY:  Past Medical History:  Past Medical History:  Diagnosis Date   Diabetes mellitus    Hypertension    Prostatitis 2011   Stroke [facility])    Past Surgical History:  Past Surgical History:  Procedure Laterality Date   COLONOSCOPY     None     Social History:  reports that he has quit smoking. He has never used smokeless tobacco. He reports current alcohol use of about 3.0 standard drinks of alcohol per week. He reports that he does not use drugs. Family History:  Family History  Problem Relation Age of Onset   Cancer Mother 60        leukemia   Hypertension Other    Diabetes Maternal Grandfather    Stroke Sister    Cancer Brother    HIV/AIDS Brother    Colon cancer Neg Hx    Esophageal cancer Neg Hx    Liver cancer Neg Hx    Rectal cancer Neg Hx    Stomach cancer Neg Hx    Colon polyps Neg Hx      HOME MEDICATIONS: Allergies as of 06/06/2022       Reactions   Accuretic [quinapril-hydrochlorothiazide]    Drug rash ?        Medication List        Accurate as of June 06, 2022  7:39 AM. If you have any questions, ask your nurse or doctor.          amLODipine-valsartan 5-320 MG tablet Commonly known as: EXFORGE Take 1 tablet by mouth daily.   aspirin 325 MG tablet Take 1 tablet (325 mg total) by mouth daily.   atorvastatin 20 MG tablet Commonly known as: LIPITOR TAKE 1 TABLET BY MOUTH EVERY DAY   b complex vitamins tablet Take 1 tablet by mouth daily.   baclofen 10 MG tablet Commonly known as: LIORESAL Take 1 tablet (10 mg total) by mouth 3 (three) times daily. OK to take extra dose as needed   blood glucose meter kit and supplies Kit Dispense based on patient and insurance  preference. Use two times daily as directed. (FOR ICD-9 250.00, 250.01).   Cholecalciferol 25 MCG (1000 UT) tablet Take 1,000 Units by mouth daily.   Clenpiq 10-3.5-12 MG-GM -GM/160ML Soln Generic drug: Sod Picosulfate-Mag Ox-Cit Acd Take 1 kit by mouth once for 1 dose.   docusate sodium 100 MG capsule Commonly known as: Colace Take 1 capsule (100 mg total) by mouth 2 (two) times daily.   doxazosin 2 MG tablet Commonly known as: CARDURA TAKE 1 TABLET BY MOUTH EVERY DAY   glucose blood test strip Commonly known as: OneTouch Verio Use to check blood sugars twice a day Dx E11.9 Yearly physical w/labs are due must see MD for refills   METAMUCIL PO Take by mouth 2 (two) times a day.   metFORMIN 500 MG 24 hr tablet Commonly known as: GLUCOPHAGE-XR TAKE 4 TABLETS (2,000 MG TOTAL) BY MOUTH DAILY    pantoprazole 40 MG tablet Commonly known as: PROTONIX TAKE 1 TABLET BY MOUTH EVERY DAY   pantoprazole 40 MG tablet Commonly known as: PROTONIX 40 mg by mouth twice a day for two months and then 40 mg by mouth daily   Rybelsus 7 MG Tabs Generic drug: Semaglutide Take 7 mg by mouth daily.   sildenafil 100 MG tablet Commonly known as: VIAGRA Take 1 tablet (100 mg total) by mouth daily as needed.   triamcinolone cream 0.1 % Commonly known as: KENALOG APPLY TO AFFECTED AREA TWICE A DAY   zolpidem 10 MG tablet Commonly known as: AMBIEN Take 0.5-1 tablets (5-10 mg total) by mouth at bedtime as needed for sleep.         OBJECTIVE:   Vital Signs: BP 128/72 (BP Location: Left Arm, Patient Position: Sitting, Cuff Size: Large)   Pulse 62   Ht 5\' 10"  (1.778 m)   Wt 215 lb (97.5 kg)   SpO2 98%   BMI 30.85 kg/m   Wt Readings from Last 3 Encounters:  06/06/22 215 lb (97.5 kg)  04/23/22 214 lb (97.1 kg)  04/01/22 215 lb (97.5 kg)     Exam: General: Pt appears well and is in NAD  Lungs: Clear with good BS bilat   Heart: RRR   Abdomen:  soft, nontender  Extremities: No pretibial edema.   Neuro: MS is good with appropriate affect, pt is alert and Ox3    DM foot exam:06/06/2022  The skin of the feet is intact without sores or ulcerations. The pedal pulses are 2+ on right and 2+ on left. The sensation is absent to a screening 5.07, 10 gram monofilament bilaterally     DATA REVIEWED:  Lab Results  Component Value Date   HGBA1C 7.3 (H) 05/09/2022   HGBA1C 7.3 (H) 04/23/2022   HGBA1C 7.4 (H) 02/06/2022       Latest Reference Range & Units 05/09/22 11:52  Sodium 135 - 145 mEq/L 139  Potassium 3.5 - 5.1 mEq/L 4.5  Chloride 96 - 112 mEq/L 105  CO2 19 - 32 mEq/L 27  Glucose 70 - 99 mg/dL 122 (H)  BUN 6 - 23 mg/dL 12  Creatinine 0.40 - 1.50 mg/dL 0.96  Calcium 8.4 - 10.5 mg/dL 9.5  GFR >60.00 mL/min 79.16     In Office BG 151 mg/dL   ASSESSMENT / PLAN /  RECOMMENDATIONS:   1) Type 2 Diabetes Mellitus, Sub-Optimally controlled, With neuropathic and macrovascular complications - Most recent A1c of 7.3 %. Goal A1c < 7.0 %.    - A1c stable  - Pt admits  to dietary indiscretions over the past 6 months , he does exercise 6 days a week  - We discussed avoiding sugar-sweetened beverages and avoiding snacks, discussed low carb options for snacks if necessary  - We discussed options to increase Rybelsus vs working on low carb diet , pt opted to work on lifestyle changes  - NO changes   MEDICATIONS: Continue Metformin 500 mg 4 tabs daily  Continue Rybelsus 7 mg daily   EDUCATION / INSTRUCTIONS: BG monitoring instructions: Patient is instructed to check his blood sugars 2-3 times a week. Call [facility] if: BG persistently < 70  I reviewed the Rule of 15 for the treatment of hypoglycemia in detail with the patient. Literature supplied.    2) Diabetic complications:  Eye: Does not have known diabetic retinopathy.  Neuro/ Feet: Does  have known diabetic peripheral neuropathy .  Renal: Patient does not have known baseline CKD. He   is  on an ACEI/ARB at present.     F/U in 4 months     Signed electronically by: Mack Guise, MD  [facility]  [address] [place], [place] [address] Phone: [phone] FAX: [phone]   CC: Cassandria Anger, MD [place] [address] Phone: [phone]  Fax: [phone]  Return to Endocrinology clinic as below: Future Appointments  Date Time Provider [place]  07/22/2022  9:10 AM Plotnikov, Evie Lacks, MD [facility] None  12/15/2022 10:00 AM [facility]-NURSE HEALTH ADVISOR 2 [facility]  02/23/2023 10:30 AM Narda Amber K, DO [facility] None

## 2022-06-06 NOTE — Patient Instructions (Addendum)
Continue Rybelsus 7 mg, 1 tablet before breakfast daily  Continue Metformin 500 mg 4 tablets daily     HOW TO TREAT LOW BLOOD SUGARS (Blood sugar LESS THAN 70 MG/DL) Please follow the RULE OF 15 for the treatment of hypoglycemia treatment (when your (blood sugars are less than 70 mg/dL)   STEP 1: Take 15 grams of carbohydrates when your blood sugar is low, which includes:  3-4 GLUCOSE TABS  OR 3-4 OZ OF JUICE OR REGULAR SODA OR ONE TUBE OF GLUCOSE GEL    STEP 2: RECHECK blood sugar in 15 MINUTES STEP 3: If your blood sugar is still low at the 15 minute recheck --> then, go back to STEP 1 and treat AGAIN with another 15 grams of carbohydrates.

## 2022-06-29 ENCOUNTER — Other Ambulatory Visit: Payer: Self-pay | Admitting: Internal Medicine

## 2022-07-17 ENCOUNTER — Other Ambulatory Visit: Payer: Self-pay

## 2022-07-17 ENCOUNTER — Telehealth: Payer: Self-pay | Admitting: Internal Medicine

## 2022-07-17 ENCOUNTER — Emergency Department (HOSPITAL_COMMUNITY): Payer: Medicare PPO

## 2022-07-17 ENCOUNTER — Encounter (HOSPITAL_COMMUNITY): Payer: Self-pay

## 2022-07-17 ENCOUNTER — Emergency Department (HOSPITAL_COMMUNITY)
Admission: EM | Admit: 2022-07-17 | Discharge: 2022-07-17 | Disposition: A | Discharge status: Home / Self Care | Payer: Medicare PPO | Attending: Emergency Medicine | Admitting: Emergency Medicine

## 2022-07-17 DIAGNOSIS — Z7984 Long term (current) use of oral hypoglycemic drugs: Secondary | ICD-10-CM | POA: Insufficient documentation

## 2022-07-17 DIAGNOSIS — J0510 Acute epiglottitis without obstruction: Secondary | ICD-10-CM | POA: Diagnosis not present

## 2022-07-17 DIAGNOSIS — I10 Essential (primary) hypertension: Secondary | ICD-10-CM | POA: Insufficient documentation

## 2022-07-17 DIAGNOSIS — T182XXA Foreign body in stomach, initial encounter: Secondary | ICD-10-CM | POA: Diagnosis not present

## 2022-07-17 DIAGNOSIS — J45909 Unspecified asthma, uncomplicated: Secondary | ICD-10-CM | POA: Insufficient documentation

## 2022-07-17 DIAGNOSIS — T189XXA Foreign body of alimentary tract, part unspecified, initial encounter: Secondary | ICD-10-CM | POA: Insufficient documentation

## 2022-07-17 DIAGNOSIS — X58XXXA Exposure to other specified factors, initial encounter: Secondary | ICD-10-CM | POA: Insufficient documentation

## 2022-07-17 DIAGNOSIS — Z8673 Personal history of transient ischemic attack (TIA), and cerebral infarction without residual deficits: Secondary | ICD-10-CM | POA: Diagnosis not present

## 2022-07-17 DIAGNOSIS — Z79899 Other long term (current) drug therapy: Secondary | ICD-10-CM | POA: Diagnosis not present

## 2022-07-17 NOTE — Telephone Encounter (Signed)
Pt spouse called wanted to see if pt can get an alternative for amLODipine-valsartan (EXFORGE) 5-320 MG tablet pharmacy called and stated they are out of stock with medication and pt has been out for 2 days. Please call pt back with update

## 2022-07-17 NOTE — ED Provider Notes (Signed)
[facility] Provider Note   CSN: [id number] Arrival date & time: 07/17/22  1530     History  Chief Complaint  Patient presents with   Swallowed Foreign Body    Christian Clements is a 73 y.o. male with T2DM, h/o CVA w/ residual hemiparesis, HLD, BPH, HTN, mild cognitive impairment, dysphagia who presents with swallowed FB.   Patient was at the dentist having a new crown put on one of his lower incisors and the crown fell down his throat. He is not sure if he inhaled it or swallowed it. When it happened he did not feel the urge to cough. He hasn't had any neck, throat, chest, abdominal pain.  No nausea vomiting diarrhea.  This has never happened before.  Dentist suggested he come to the emergency department.  Otherwise in his normal state of health.   Swallowed Foreign Body       Home Medications Prior to Admission medications   Medication Sig Start Date End Date Taking? Authorizing Provider  amLODipine-valsartan (EXFORGE) 5-320 MG tablet TAKE 1 TABLET BY MOUTH EVERY DAY 06/29/22   Plotnikov, Georgina Quint, MD  aspirin 325 MG tablet Take 1 tablet (325 mg total) by mouth daily. 02/13/16   Plotnikov, Georgina Quint, MD  atorvastatin (LIPITOR) 20 MG tablet TAKE 1 TABLET BY MOUTH EVERY DAY 02/17/22   Plotnikov, Georgina Quint, MD  b complex vitamins tablet Take 1 tablet by mouth daily. 05/16/16   Plotnikov, Georgina Quint, MD  baclofen (LIORESAL) 10 MG tablet Take 1 tablet (10 mg total) by mouth 3 (three) times daily. OK to take extra dose as needed 07/01/21   Nita Sickle K, DO  blood glucose meter kit and supplies KIT Dispense based on patient and insurance preference. Use two times daily as directed. (FOR ICD-9 250.00, 250.01). 01/10/16   Nche, Bonna Gains, NP  Cholecalciferol 1000 UNITS tablet Take 1,000 Units by mouth daily.    [provider]  docusate sodium (COLACE) 100 MG capsule Take 1 capsule (100 mg total) by mouth 2 (two) times daily. 05/17/20    Plotnikov, Georgina Quint, MD  doxazosin (CARDURA) 2 MG tablet TAKE 1 TABLET BY MOUTH EVERY DAY 04/03/19   Plotnikov, Georgina Quint, MD  glucose blood (ONETOUCH VERIO) test strip Use to check blood sugars twice a day Dx E11.9 Yearly physical w/labs are due must see MD for refills 03/07/16   Plotnikov, Georgina Quint, MD  metFORMIN (GLUCOPHAGE-XR) 500 MG 24 hr tablet Take 4 tablets (2,000 mg total) by mouth daily. 06/06/22   Shamleffer, Konrad Dolores, MD  pantoprazole (PROTONIX) 40 MG tablet TAKE 1 TABLET BY MOUTH EVERY DAY 11/05/18   Plotnikov, Georgina Quint, MD  pantoprazole (PROTONIX) 40 MG tablet 40 mg by mouth twice a day for two months and then 40 mg by mouth daily 04/01/22   Meryl Dare, MD  Psyllium (METAMUCIL PO) Take by mouth 2 (two) times a day.    [provider]  Semaglutide (RYBELSUS) 7 MG TABS Take 1 tablet (7 mg total) by mouth daily. 06/06/22   Shamleffer, Konrad Dolores, MD  sildenafil (VIAGRA) 100 MG tablet Take 1 tablet (100 mg total) by mouth daily as needed. 12/28/13   Plotnikov, Georgina Quint, MD  Sod Picosulfate-Mag Ox-Cit Acd (CLENPIQ) 10-3.5-12 MG-GM -GM/160ML SOLN Take 1 kit by mouth once for 1 dose. 03/13/22   Doree Albee, PA-C  triamcinolone cream (KENALOG) 0.1 % APPLY TO AFFECTED AREA TWICE A DAY 01/22/21  Plotnikov, Georgina Quint, MD  zolpidem (AMBIEN) 10 MG tablet Take 0.5-1 tablets (5-10 mg total) by mouth at bedtime as needed for sleep. 12/17/21   Plotnikov, Georgina Quint, MD      Allergies    Accuretic [quinapril-hydrochlorothiazide]    Review of Systems   Review of Systems Review of systems Negative for cough, CP, SOB.  A 10 point review of systems was performed and is negative unless otherwise reported in HPI.  Physical Exam Updated Vital Signs BP (!) 156/97   Pulse (!) 57   Temp 97.8 F (36.6 C) (Oral)   Resp 18   Ht 5\' 10"  (1.778 m)   Wt 95.3 kg   SpO2 98%   BMI 30.13 kg/m  Physical Exam General: Normal appearing male, lying in bed.  HEENT: PERRLA, Sclera  anicteric, MMM, trachea midline. Clear oropharynx. Cardiology: RRR, no murmurs/rubs/gallops. BL radial and DP pulses equal bilaterally.  Resp: Normal respiratory rate and effort. CTAB, no wheezes, rhonchi, crackles.  Abd: Soft, non-tender, non-distended. No rebound tenderness or guarding.  GU: Deferred. MSK: No peripheral edema or signs of trauma. Skin: warm, dry. Neuro: A&Ox4, CNs II-XII grossly intact. MAEs. Sensation grossly intact.  Psych: Normal mood and affect.   ED Results / Procedures / Treatments   Labs (all labs ordered are listed, but only abnormal results are displayed) Labs Reviewed - No data to display  EKG None  Radiology DG Chest 2 View  Result Date: 07/17/2022 CLINICAL DATA:  Swallowed dental crown EXAM: CHEST - 2 VIEW COMPARISON:  None Available. FINDINGS: Heart and mediastinal contours are within normal limits. No focal opacities or effusions. No acute bony abnormality. Dental crown is visualized on the lower aspect of the image in the midline of the upper abdomen. IMPRESSION: Dental crown in the midline of the upper abdomen, likely in the distal stomach. No acute cardiopulmonary disease. Electronically Signed   By: Charlett Nose M.D.   On: 07/17/2022 17:10   DG Neck Soft Tissue  Result Date: 07/17/2022 CLINICAL DATA:  Swallowed foreign body, dental crown EXAM: NECK SOFT TISSUES - 1+ VIEW COMPARISON:  None Available. FINDINGS: Prevertebral soft tissues are unremarkable. No opaque foreign bodies are seen. Epiglottis is not thickened. There is no significant narrowing of airways. No focal abnormalities are seen in the visualized upper medial aspects of both lungs. IMPRESSION: No radiopaque foreign bodies are seen in the neck. Epiglottis is not thickened. There is no significant narrowing of the airways. Electronically Signed   By: Ernie Avena M.D.   On: 07/17/2022 17:07   DG Abd 2 Views  Result Date: 07/17/2022 CLINICAL DATA:  Swallowed dental crown EXAM: ABDOMEN  - 2 VIEW COMPARISON:  None Available. FINDINGS: Scattered colonic stool. Gas is seen in nondilated loops of small and large bowel. There is a metallic focus overlying the distal stomach consistent with the patient's history of swallowed radiopaque foreign body. The extreme caudal aspect of the pelvis is clipped off the edge of the film. IMPRESSION: Radiopaque foreign body overlying the distal stomach in the same which could be consistent with a dental crown. Nonspecific bowel gas pattern with scattered stool. Electronically Signed   By: Karen Kays M.D.   On: 07/17/2022 17:05    Procedures Procedures    Medications Ordered in ED Medications - No data to display  ED Course/ Medical Decision Making/ A&P  Medical Decision Making Amount and/or Complexity of Data Reviewed Radiology: ordered. Decision-making details documented in ED Course.    This patient presents to the ED for concern of swallowed/inhaled foreign body, this involves an extensive number of treatment options, and is a complaint that carries with it a high risk of complications and morbidity.  I considered the following differential and admission for this acute, potentially life threatening condition.   MDM:    Patient is overall extremely well-appearing and has no acute symptoms at this time.  He is hemodynamically stable, not hypoxic, not tachypneic.  Patient had no cough reflex when the foreign body was ingested, I have overall low concern that patient inhaled the foreign body, much more likely that he swallowed it.  Patient is completely asymptomatic at this time.  Neck chest and abdominal x-rays demonstrate incisor in stomach. No abdominal pain or TTP, no c/f perforated viscous. No c/f esophageal/oropharyngeal FB or injury.   Clinical Course as of 07/17/22 1731  Thu Jul 17, 2022  1714 DG Chest 2 View Dental crown in the midline of the upper abdomen, likely in the distal stomach.  No acute  cardiopulmonary disease.   [HN]  1714 DG Neck Soft Tissue No radiopaque foreign bodies are seen in the neck. Epiglottis is not thickened. There is no significant narrowing of the airways.   [HN]  1714 DG Abd 2 Views Radiopaque foreign body overlying the distal stomach in the same which could be consistent with a dental crown.  Nonspecific bowel gas pattern with scattered stool.   [HN]  1716 Messaged Dr. Ewing Schlein w/ GI. [HN]  1729 Dr. Ewing Schlein recommends f/u XR in 1 week. DC w/ discharge instructions/return precautions. All questions answered to patient's satisfaction.   [HN]    Clinical Course User Index [HN] Loetta Rough, MD    Imaging Studies ordered: I ordered imaging studies including neck, chest, abdominal x-ray I independently visualized and interpreted imaging. I agree with the radiologist interpretation  Additional history obtained from chart review, wife at bedside.    Social Determinants of Health: Patient lives independently   Disposition:  DC  Co morbidities that complicate the patient evaluation  Past Medical History:  Diagnosis Date   Diabetes mellitus    Hypertension    Prostatitis 2011   Stroke      Medicines No orders of the defined types were placed in this encounter.   I have reviewed the patients home medicines and have made adjustments as needed  Problem List / ED Course: Problem List Items Addressed This Visit   None Visit Diagnoses     Swallowed foreign body, initial encounter    -  Primary                   This note was created using dictation software, which may contain spelling or grammatical errors.    Loetta Rough, MD 07/17/22 [phone]

## 2022-07-17 NOTE — ED Triage Notes (Signed)
Patient swallowed a dental crown about 1 hour ago. Not having any symptoms. Dentist suggested to come to ED.

## 2022-07-17 NOTE — Discharge Instructions (Signed)
Thank you for coming to [facility] Emergency Department. You were seen for ingested foreign body. We did an exam, labs, and imaging, and these showed a dental crown in your stomach. This should pass on its own.  Please follow up with your primary care provider within 1 week for a follow-up abdominal xray.  Do not hesitate to return to the ED or call 911 if you experience: -Worsening symptoms -Abdominal or chest pain -Nausea/vomiting -Blood per rectum -Lightheadedness, passing out -Fevers/chills -Anything else that concerns you

## 2022-07-18 MED ORDER — VALSARTAN 320 MG PO TABS: 320.0000 mg | ORAL_TABLET | Freq: Every day | ORAL | 3 refills | Status: DC

## 2022-07-18 MED ORDER — AMLODIPINE BESYLATE 5 MG PO TABS: 5.0000 mg | ORAL_TABLET | Freq: Every day | ORAL | 3 refills | Status: DC

## 2022-07-18 NOTE — Telephone Encounter (Signed)
Called back spouse and advised her of new Rxs sent, she verbalized understanding and states she will check w pharmacy and go pick it up

## 2022-07-18 NOTE — Telephone Encounter (Signed)
Noted.  Will break into amlodipine and valsartan separately.  Thanks

## 2022-07-22 ENCOUNTER — Ambulatory Visit: Payer: Medicare PPO | Admitting: Internal Medicine

## 2022-07-22 ENCOUNTER — Encounter: Payer: Self-pay | Admitting: Internal Medicine

## 2022-07-22 VITALS — BP 110/80 | HR 80 | Temp 98.6°F | Ht 70.0 in | Wt 210.0 lb

## 2022-07-22 DIAGNOSIS — E1129 Type 2 diabetes mellitus with other diabetic kidney complication: Secondary | ICD-10-CM

## 2022-07-22 DIAGNOSIS — I69359 Hemiplegia and hemiparesis following cerebral infarction affecting unspecified side: Secondary | ICD-10-CM | POA: Diagnosis not present

## 2022-07-22 DIAGNOSIS — L309 Dermatitis, unspecified: Secondary | ICD-10-CM | POA: Insufficient documentation

## 2022-07-22 DIAGNOSIS — R809 Proteinuria, unspecified: Secondary | ICD-10-CM

## 2022-07-22 DIAGNOSIS — E1142 Type 2 diabetes mellitus with diabetic polyneuropathy: Secondary | ICD-10-CM

## 2022-07-22 DIAGNOSIS — T182XXA Foreign body in stomach, initial encounter: Secondary | ICD-10-CM | POA: Insufficient documentation

## 2022-07-22 DIAGNOSIS — E1159 Type 2 diabetes mellitus with other circulatory complications: Secondary | ICD-10-CM

## 2022-07-22 DIAGNOSIS — T182XXD Foreign body in stomach, subsequent encounter: Secondary | ICD-10-CM

## 2022-07-22 LAB — COMPREHENSIVE METABOLIC PANEL
ALT: 21 U/L (ref 0–53)
AST: 18 U/L (ref 0–37)
Albumin: 4.7 g/dL (ref 3.5–5.2)
Alkaline Phosphatase: 59 U/L (ref 39–117)
BUN: 13 mg/dL (ref 6–23)
CO2: 22 mEq/L (ref 19–32)
Calcium: 9.3 mg/dL (ref 8.4–10.5)
Chloride: 106 mEq/L (ref 96–112)
Creatinine, Ser: 0.93 mg/dL (ref 0.40–1.50)
GFR: 82.12 mL/min (ref >60.00)
Glucose, Bld: 110 mg/dL — ABNORMAL HIGH (ref 70–99)
Potassium: 4 mEq/L (ref 3.5–5.1)
Sodium: 138 mEq/L (ref 135–145)
Total Bilirubin: 0.8 mg/dL (ref 0.2–1.2)
Total Protein: 7.8 g/dL (ref 6.0–8.3)

## 2022-07-22 LAB — MICROALBUMIN / CREATININE URINE RATIO
Creatinine,U: 105.9 mg/dL
Microalb Creat Ratio: 5.8 mg/g (ref 0.0–30.0)
Microalb, Ur: 6.1 mg/dL — ABNORMAL HIGH (ref 0.0–1.9)

## 2022-07-22 LAB — HEMOGLOBIN A1C: Hgb A1c MFr Bld: 7 % — ABNORMAL HIGH (ref 4.6–6.5)

## 2022-07-22 MED ORDER — RYBELSUS 14 MG PO TABS
14.0000 mg | ORAL_TABLET | Freq: Every day | ORAL | 5 refills | Status: DC
Start: 2022-07-22 — End: 2023-02-10

## 2022-07-22 MED ORDER — TRIAMCINOLONE ACETONIDE 0.5 % EX OINT: 1.0000 | TOPICAL_OINTMENT | Freq: Four times a day (QID) | CUTANEOUS | 1 refills | Status: AC

## 2022-07-22 NOTE — Assessment & Plan Note (Signed)
  Exercise qod to improve fatigue

## 2022-07-22 NOTE — Progress Notes (Signed)
Subjective:  Patient ID: Christian Clements, male    DOB: 1950/07/27  Age: 72 y.o. MRN: [id number]  CC: Follow-up (3 MNTH F/U, RASH ON B/L PALM AND LEGS)   HPI Christian Clements presents for HTN, DM C/o hand rash x 3 weeks Pt swallowed a broken bridge at his dentist's on 07/17/22 - went to ER  Outpatient Medications Prior to Visit  Medication Sig Dispense Refill   amLODipine (NORVASC) 5 MG tablet Take 1 tablet (5 mg total) by mouth daily. 90 tablet 3   amLODipine-valsartan (EXFORGE) 5-320 MG tablet TAKE 1 TABLET BY MOUTH EVERY DAY 90 tablet 3   aspirin 325 MG tablet Take 1 tablet (325 mg total) by mouth daily. 100 tablet 3   atorvastatin (LIPITOR) 20 MG tablet TAKE 1 TABLET BY MOUTH EVERY DAY 90 tablet 3   b complex vitamins tablet Take 1 tablet by mouth daily. 100 tablet 3   baclofen (LIORESAL) 10 MG tablet Take 1 tablet (10 mg total) by mouth 3 (three) times daily. OK to take extra dose as needed 300 tablet 3   blood glucose meter kit and supplies KIT Dispense based on patient and insurance preference. Use two times daily as directed. (FOR ICD-9 250.00, 250.01). 1 each 0   Cholecalciferol 1000 UNITS tablet Take 1,000 Units by mouth daily.     docusate sodium (COLACE) 100 MG capsule Take 1 capsule (100 mg total) by mouth 2 (two) times daily. 60 capsule 11   doxazosin (CARDURA) 2 MG tablet TAKE 1 TABLET BY MOUTH EVERY DAY 90 tablet 3   glucose blood (ONETOUCH VERIO) test strip Use to check blood sugars twice a day Dx E11.9 Yearly physical w/labs are due must see MD for refills 100 each 0   metFORMIN (GLUCOPHAGE-XR) 500 MG 24 hr tablet Take 4 tablets (2,000 mg total) by mouth daily. 360 tablet 3   pantoprazole (PROTONIX) 40 MG tablet 40 mg by mouth twice a day for two months and then 40 mg by mouth daily 90 tablet 3   Psyllium (METAMUCIL PO) Take by mouth 2 (two) times a day.     sildenafil (VIAGRA) 100 MG tablet Take 1 tablet (100 mg total) by mouth daily as needed. 10 tablet 11   Sod  Picosulfate-Mag Ox-Cit Acd (CLENPIQ) 10-3.5-12 MG-GM -GM/160ML SOLN Take 1 kit by mouth once for 1 dose. 354 mL 0   valsartan (DIOVAN) 320 MG tablet Take 1 tablet (320 mg total) by mouth daily. 90 tablet 3   zolpidem (AMBIEN) 10 MG tablet Take 0.5-1 tablets (5-10 mg total) by mouth at bedtime as needed for sleep. 30 tablet 1   pantoprazole (PROTONIX) 40 MG tablet TAKE 1 TABLET BY MOUTH EVERY DAY 90 tablet 1   Semaglutide (RYBELSUS) 7 MG TABS Take 1 tablet (7 mg total) by mouth daily. 90 tablet 3   triamcinolone cream (KENALOG) 0.1 % APPLY TO AFFECTED AREA TWICE A DAY 60 g 3   No facility-administered medications prior to visit.    ROS: Review of Systems  Constitutional:  Negative for appetite change, fatigue and unexpected weight change.  HENT:  Negative for congestion, nosebleeds, sneezing, sore throat and trouble swallowing.   Eyes:  Negative for itching and visual disturbance.  Respiratory:  Negative for cough.   Cardiovascular:  Negative for chest pain, palpitations and leg swelling.  Gastrointestinal:  Negative for abdominal distention, blood in stool, diarrhea and nausea.  Genitourinary:  Negative for frequency and hematuria.  Musculoskeletal:  Negative for back pain,  gait problem, joint swelling and neck pain.  Skin:  Positive for rash.  Neurological:  Negative for dizziness, tremors, speech difficulty and weakness.  Psychiatric/Behavioral:  Negative for agitation, dysphoric mood and sleep disturbance. The patient is not nervous/anxious.     Objective:  BP 110/80 (BP Location: Right Arm, Patient Position: Sitting, Cuff Size: Large)   Pulse 80   Temp 98.6 F (37 C) (Oral)   Ht 5\' 10"  (1.778 m)   Wt 210 lb (95.3 kg)   SpO2 95%   BMI 30.13 kg/m   BP Readings from Last 3 Encounters:  07/22/22 110/80  07/17/22 (!) 150/86  06/06/22 128/72    Wt Readings from Last 3 Encounters:  07/22/22 210 lb (95.3 kg)  07/17/22 210 lb (95.3 kg)  06/06/22 215 lb (97.5 kg)    Physical  Exam Constitutional:      General: He is not in acute distress.    Appearance: Normal appearance. He is well-developed.     Comments: NAD  Eyes:     Conjunctiva/sclera: Conjunctivae normal.     Pupils: Pupils are equal, round, and reactive to light.  Neck:     Thyroid: No thyromegaly.     Vascular: No JVD.  Cardiovascular:     Rate and Rhythm: Normal rate and regular rhythm.     Heart sounds: Normal heart sounds. No murmur heard.    No friction rub. No gallop.  Pulmonary:     Effort: Pulmonary effort is normal. No respiratory distress.     Breath sounds: Normal breath sounds. No wheezing or rales.  Chest:     Chest wall: No tenderness.  Abdominal:     General: Bowel sounds are normal. There is no distension.     Palpations: Abdomen is soft. There is no mass.     Tenderness: There is no abdominal tenderness. There is no guarding or rebound.  Musculoskeletal:        General: No tenderness. Normal range of motion.     Cervical back: Normal range of motion.  Lymphadenopathy:     Cervical: No cervical adenopathy.  Skin:    General: Skin is warm and dry.     Findings: No rash.  Neurological:     Mental Status: He is alert and oriented to person, place, and time.     Cranial Nerves: No cranial nerve deficit.     Motor: No abnormal muscle tone.     Coordination: Coordination normal.     Gait: Gait normal.     Deep Tendon Reflexes: Reflexes are normal and symmetric.  Psychiatric:        Behavior: Behavior normal.        Thought Content: Thought content normal.        Judgment: Judgment normal.    B and eczema - palms, fingers  Lab Results  Component Value Date   WBC 8.3 03/13/2022   HGB 15.6 03/13/2022   HCT 45.3 03/13/2022   PLT 305.0 03/13/2022   GLUCOSE 122 (H) 05/09/2022   CHOL 74 08/27/2021   TRIG 104.0 08/27/2021   HDL 32.90 (L) 08/27/2021   LDLDIRECT 30.1 11/14/2010   LDLCALC 21 08/27/2021   ALT 27 04/23/2022   AST 18 04/23/2022   NA 139 05/09/2022   K 4.5  05/09/2022   CL 105 05/09/2022   CREATININE 0.96 05/09/2022   BUN 12 05/09/2022   CO2 27 05/09/2022   TSH 3.03 08/27/2021   PSA 5.03 (H) 08/27/2021   INR 1.2 (H)  05/15/2016   HGBA1C 7.3 (H) 05/09/2022   MICROALBUR 4.0 (H) 02/11/2019    DG Chest 2 View  Result Date: 07/17/2022 CLINICAL DATA:  Swallowed dental crown EXAM: CHEST - 2 VIEW COMPARISON:  None Available. FINDINGS: Heart and mediastinal contours are within normal limits. No focal opacities or effusions. No acute bony abnormality. Dental crown is visualized on the lower aspect of the image in the midline of the upper abdomen. IMPRESSION: Dental crown in the midline of the upper abdomen, likely in the distal stomach. No acute cardiopulmonary disease. Electronically Signed   By: Charlett Nose M.D.   On: 07/17/2022 17:10   DG Neck Soft Tissue  Result Date: 07/17/2022 CLINICAL DATA:  Swallowed foreign body, dental crown EXAM: NECK SOFT TISSUES - 1+ VIEW COMPARISON:  None Available. FINDINGS: Prevertebral soft tissues are unremarkable. No opaque foreign bodies are seen. Epiglottis is not thickened. There is no significant narrowing of airways. No focal abnormalities are seen in the visualized upper medial aspects of both lungs. IMPRESSION: No radiopaque foreign bodies are seen in the neck. Epiglottis is not thickened. There is no significant narrowing of the airways. Electronically Signed   By: Ernie Avena M.D.   On: 07/17/2022 17:07   DG Abd 2 Views  Result Date: 07/17/2022 CLINICAL DATA:  Swallowed dental crown EXAM: ABDOMEN - 2 VIEW COMPARISON:  None Available. FINDINGS: Scattered colonic stool. Gas is seen in nondilated loops of small and large bowel. There is a metallic focus overlying the distal stomach consistent with the patient's history of swallowed radiopaque foreign body. The extreme caudal aspect of the pelvis is clipped off the edge of the film. IMPRESSION: Radiopaque foreign body overlying the distal stomach in the same  which could be consistent with a dental crown. Nonspecific bowel gas pattern with scattered stool. Electronically Signed   By: Karen Kays M.D.   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     Cont on Metformin, Rybelsus - increase to 14 mg/d      Relevant Medications   Semaglutide (RYBELSUS) 14 MG TABS   Other Relevant Orders   Urine microalbumin-creatinine with uACR   Comprehensive metabolic panel   Hemoglobin A1c   CVA, old, hemiparesis (HCC)     Exercise qod to improve fatigue      Type 2 diabetes mellitus with diabetic polyneuropathy, without long-term current use of insulin (HCC)    On Rx - see meds Labs today      Relevant Medications   Semaglutide (RYBELSUS) 14 MG TABS   Foreign body in stomach    Discussed X ray - Radiopaque foreign body overlying the distal stomach in the same which could be consistent with a dental crown He should be able to pass it       Hand eczema    New Gloves Kenalog oint prn         Meds ordered this encounter  Medications   Semaglutide (RYBELSUS) 14 MG TABS    Sig: Take 1 tablet (14 mg total) by mouth daily.    Dispense:  30 tablet    Refill:  5   triamcinolone ointment (KENALOG) 0.5 %    Sig: Apply 1 Application topically 4 (four) times daily.    Dispense:  120 g    Refill:  1      Follow-up: Return in about  3 months (around 10/21/2022) for a follow-up visit.  Sonda Primes, MD

## 2022-07-22 NOTE — Assessment & Plan Note (Signed)
New Gloves Kenalog oint prn

## 2022-07-22 NOTE — Assessment & Plan Note (Addendum)
  Cont on Metformin, Rybelsus - increase to 14 mg/d

## 2022-07-22 NOTE — Assessment & Plan Note (Signed)
Discussed X ray - Radiopaque foreign body overlying the distal stomach in the same which could be consistent with a dental crown He should be able to pass it

## 2022-07-22 NOTE — Assessment & Plan Note (Signed)
On Rx - see meds Labs today

## 2022-07-23 DIAGNOSIS — E1129 Type 2 diabetes mellitus with other diabetic kidney complication: Secondary | ICD-10-CM | POA: Insufficient documentation

## 2022-07-23 NOTE — Assessment & Plan Note (Signed)
We can add another medication like [place] or Chauncey Mann to help to protect the kidneys.

## 2022-09-27 ENCOUNTER — Other Ambulatory Visit: Payer: Self-pay | Admitting: Neurology

## 2022-09-27 ENCOUNTER — Other Ambulatory Visit: Payer: Self-pay | Admitting: Gastroenterology

## 2022-09-27 DIAGNOSIS — K219 Gastro-esophageal reflux disease without esophagitis: Secondary | ICD-10-CM

## 2022-09-27 DIAGNOSIS — R1310 Dysphagia, unspecified: Secondary | ICD-10-CM

## 2022-09-27 DIAGNOSIS — K2960 Other gastritis without bleeding: Secondary | ICD-10-CM

## 2022-10-21 ENCOUNTER — Ambulatory Visit: Payer: Medicare PPO | Admitting: Internal Medicine

## 2022-10-21 ENCOUNTER — Encounter: Payer: Self-pay | Admitting: Internal Medicine

## 2022-10-21 VITALS — BP 138/78 | HR 68 | Temp 97.9°F | Ht 70.0 in | Wt 214.0 lb

## 2022-10-21 DIAGNOSIS — R413 Other amnesia: Secondary | ICD-10-CM | POA: Diagnosis not present

## 2022-10-21 DIAGNOSIS — E1159 Type 2 diabetes mellitus with other circulatory complications: Secondary | ICD-10-CM

## 2022-10-21 DIAGNOSIS — R202 Paresthesia of skin: Secondary | ICD-10-CM

## 2022-10-21 DIAGNOSIS — Z7984 Long term (current) use of oral hypoglycemic drugs: Secondary | ICD-10-CM | POA: Diagnosis not present

## 2022-10-21 DIAGNOSIS — I69359 Hemiplegia and hemiparesis following cerebral infarction affecting unspecified side: Secondary | ICD-10-CM

## 2022-10-21 LAB — COMPREHENSIVE METABOLIC PANEL
ALT: 21 U/L (ref 0–53)
AST: 17 U/L (ref 0–37)
Albumin: 4.8 g/dL (ref 3.5–5.2)
Alkaline Phosphatase: 63 U/L (ref 39–117)
BUN: 12 mg/dL (ref 6–23)
CO2: 23 mEq/L (ref 19–32)
Calcium: 9.6 mg/dL (ref 8.4–10.5)
Chloride: 107 mEq/L (ref 96–112)
Creatinine, Ser: 0.93 mg/dL (ref 0.40–1.50)
GFR: 81.98 mL/min (ref >60.00)
Glucose, Bld: 105 mg/dL — ABNORMAL HIGH (ref 70–99)
Potassium: 4 mEq/L (ref 3.5–5.1)
Sodium: 139 mEq/L (ref 135–145)
Total Bilirubin: 0.7 mg/dL (ref 0.2–1.2)
Total Protein: 7.7 g/dL (ref 6.0–8.3)

## 2022-10-21 LAB — CBC WITH DIFFERENTIAL/PLATELET
Basophils Absolute: 0 10*3/uL (ref 0.0–0.1)
Basophils Relative: 0.5 % (ref 0.0–3.0)
Eosinophils Absolute: 0.2 10*3/uL (ref 0.0–0.7)
Eosinophils Relative: 2.1 % (ref 0.0–5.0)
HCT: 45.2 % (ref 39.0–52.0)
Hemoglobin: 15.1 g/dL (ref 13.0–17.0)
Lymphocytes Relative: 40.5 % (ref 12.0–46.0)
Lymphs Abs: 3.1 10*3/uL (ref 0.7–4.0)
MCHC: 33.4 g/dL (ref 30.0–36.0)
MCV: 94.8 fl (ref 78.0–100.0)
Monocytes Absolute: 0.4 10*3/uL (ref 0.1–1.0)
Monocytes Relative: 5.7 % (ref 3.0–12.0)
Neutro Abs: 4 10*3/uL (ref 1.4–7.7)
Neutrophils Relative %: 51.2 % (ref 43.0–77.0)
Platelets: 304 10*3/uL (ref 150.0–400.0)
RBC: 4.76 Mil/uL (ref 4.22–5.81)
RDW: 13.4 % (ref 11.5–15.5)
WBC: 7.7 10*3/uL (ref 4.0–10.5)

## 2022-10-21 LAB — TSH: TSH: 2.62 u[IU]/mL (ref 0.35–5.50)

## 2022-10-21 LAB — HEMOGLOBIN A1C: Hgb A1c MFr Bld: 6.7 % — ABNORMAL HIGH (ref 4.6–6.5)

## 2022-10-21 LAB — VITAMIN B12: Vitamin B-12: 583 pg/mL (ref 211–911)

## 2022-10-21 MED ORDER — DONEPEZIL HCL 5 MG PO TABS: 5.0000 mg | ORAL_TABLET | Freq: Every day | ORAL | 1 refills | Status: AC

## 2022-10-21 NOTE — Assessment & Plan Note (Signed)
No change 

## 2022-10-21 NOTE — Assessment & Plan Note (Signed)
  Cont on Metformin, Rybelsus

## 2022-10-21 NOTE — Assessment & Plan Note (Addendum)
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

## 2022-10-21 NOTE — Progress Notes (Signed)
Subjective:  Patient ID: Christian Clements, male    DOB: 06-18-1950  Age: 72 y.o. MRN: [id number]  CC: Follow-up (3 MNTH F/U, trouble with energy and difficulty with concentration x 3-4 weeks, sleeping has been an issue as well)   HPI Christian Clements presents for memory issues, forgetfulness, focus issues especially when playing golf x 2 -3 weeks  No CVA sx's  Outpatient Medications Prior to Visit  Medication Sig Dispense Refill   amLODipine (NORVASC) 5 MG tablet Take 1 tablet (5 mg total) by mouth daily. 90 tablet 3   amLODipine-valsartan (EXFORGE) 5-320 MG tablet TAKE 1 TABLET BY MOUTH EVERY DAY 90 tablet 3   aspirin 325 MG tablet Take 1 tablet (325 mg total) by mouth daily. 100 tablet 3   atorvastatin (LIPITOR) 20 MG tablet TAKE 1 TABLET BY MOUTH EVERY DAY 90 tablet 3   b complex vitamins tablet Take 1 tablet by mouth daily. 100 tablet 3   baclofen (LIORESAL) 10 MG tablet TAKE 1 TABLET (10 MG TOTAL) BY MOUTH 3 (THREE) TIMES DAILY. OK TO TAKE EXTRA DOSE AS NEEDED 300 tablet 3   blood glucose meter kit and supplies KIT Dispense based on patient and insurance preference. Use two times daily as directed. (FOR ICD-9 250.00, 250.01). 1 each 0   Cholecalciferol 1000 UNITS tablet Take 1,000 Units by mouth daily.     docusate sodium (COLACE) 100 MG capsule Take 1 capsule (100 mg total) by mouth 2 (two) times daily. 60 capsule 11   doxazosin (CARDURA) 2 MG tablet TAKE 1 TABLET BY MOUTH EVERY DAY 90 tablet 3   glucose blood (ONETOUCH VERIO) test strip Use to check blood sugars twice a day Dx E11.9 Yearly physical w/labs are due must see MD for refills 100 each 0   metFORMIN (GLUCOPHAGE-XR) 500 MG 24 hr tablet Take 4 tablets (2,000 mg total) by mouth daily. 360 tablet 3   pantoprazole (PROTONIX) 40 MG tablet TAKE 1 TAB BY MOUTH TWICE A DAY FOR TWO MONTHS AND THEN 40 MG BY MOUTH DAILY 180 tablet 1   Psyllium (METAMUCIL PO) Take by mouth 2 (two) times a day.     Semaglutide (RYBELSUS) 14 MG TABS Take  1 tablet (14 mg total) by mouth daily. 30 tablet 5   sildenafil (VIAGRA) 100 MG tablet Take 1 tablet (100 mg total) by mouth daily as needed. 10 tablet 11   Sod Picosulfate-Mag Ox-Cit Acd (CLENPIQ) 10-3.5-12 MG-GM -GM/160ML SOLN Take 1 kit by mouth once for 1 dose. 354 mL 0   triamcinolone ointment (KENALOG) 0.5 % Apply 1 Application topically 4 (four) times daily. 120 g 1   valsartan (DIOVAN) 320 MG tablet Take 1 tablet (320 mg total) by mouth daily. 90 tablet 3   zolpidem (AMBIEN) 10 MG tablet Take 0.5-1 tablets (5-10 mg total) by mouth at bedtime as needed for sleep. 30 tablet 1   No facility-administered medications prior to visit.    ROS: Review of Systems  Constitutional:  Negative for appetite change, fatigue and unexpected weight change.  HENT:  Negative for congestion, nosebleeds, sneezing, sore throat and trouble swallowing.   Eyes:  Negative for itching and visual disturbance.  Respiratory:  Negative for cough.   Cardiovascular:  Negative for chest pain, palpitations and leg swelling.  Gastrointestinal:  Negative for abdominal distention, blood in stool, diarrhea and nausea.  Genitourinary:  Negative for frequency and hematuria.  Musculoskeletal:  Negative for back pain, gait problem, joint swelling and neck pain.  Skin:  Negative for rash.  Neurological:  Negative for dizziness, tremors, speech difficulty and weakness.  Psychiatric/Behavioral:  Positive for decreased concentration. Negative for agitation, confusion, dysphoric mood and sleep disturbance. The patient is not nervous/anxious.     Objective:  BP 138/78 (BP Location: Left Arm, Patient Position: Sitting, Cuff Size: Large)   Pulse 68   Temp 97.9 F (36.6 C) (Oral)   Ht 5\' 10"  (1.778 m)   Wt 214 lb (97.1 kg)   SpO2 97%   BMI 30.71 kg/m   BP Readings from Last 3 Encounters:  10/21/22 138/78  07/22/22 110/80  07/17/22 (!) 150/86    Wt Readings from Last 3 Encounters:  10/21/22 214 lb (97.1 kg)  07/22/22  210 lb (95.3 kg)  07/17/22 210 lb (95.3 kg)    Physical Exam Constitutional:      General: He is not in acute distress.    Appearance: Normal appearance. He is well-developed.     Comments: NAD  Eyes:     Conjunctiva/sclera: Conjunctivae normal.     Pupils: Pupils are equal, round, and reactive to light.  Neck:     Thyroid: No thyromegaly.     Vascular: No JVD.  Cardiovascular:     Rate and Rhythm: Normal rate and regular rhythm.     Heart sounds: Normal heart sounds. No murmur heard.    No friction rub. No gallop.  Pulmonary:     Effort: Pulmonary effort is normal. No respiratory distress.     Breath sounds: Normal breath sounds. No wheezing or rales.  Chest:     Chest wall: No tenderness.  Abdominal:     General: Bowel sounds are normal. There is no distension.     Palpations: Abdomen is soft. There is no mass.     Tenderness: There is no abdominal tenderness. There is no guarding or rebound.  Musculoskeletal:        General: No tenderness. Normal range of motion.     Cervical back: Normal range of motion.  Lymphadenopathy:     Cervical: No cervical adenopathy.  Skin:    General: Skin is warm and dry.     Findings: No rash.  Neurological:     Mental Status: He is alert and oriented to person, place, and time.     Cranial Nerves: No cranial nerve deficit.     Motor: No abnormal muscle tone.     Coordination: Coordination normal.     Gait: Gait normal.     Deep Tendon Reflexes: Reflexes are normal and symmetric.  Psychiatric:        Behavior: Behavior normal.        Thought Content: Thought content normal.        Judgment: Judgment normal.   Serial 7's - 50% inaccurate Recalls 3/3 with 4-5 learning attempts Recalls 0/3 in 5 min  Lab Results  Component Value Date   WBC 8.3 03/13/2022   HGB 15.6 03/13/2022   HCT 45.3 03/13/2022   PLT 305.0 03/13/2022   GLUCOSE 110 (H) 07/22/2022   CHOL 74 08/27/2021   TRIG 104.0 08/27/2021   HDL 32.90 (L) 08/27/2021    LDLDIRECT 30.1 11/14/2010   LDLCALC 21 08/27/2021   ALT 21 07/22/2022   AST 18 07/22/2022   NA 138 07/22/2022   K 4.0 07/22/2022   CL 106 07/22/2022   CREATININE 0.93 07/22/2022   BUN 13 07/22/2022   CO2 22 07/22/2022   TSH 3.03 08/27/2021   PSA 5.03 (H) 08/27/2021  INR 1.2 (H) 05/15/2016   HGBA1C 7.0 (H) 07/22/2022   MICROALBUR 6.1 (H) 07/22/2022    DG Chest 2 View  Result Date: 07/17/2022 CLINICAL DATA:  Swallowed dental crown EXAM: CHEST - 2 VIEW COMPARISON:  None Available. FINDINGS: Heart and mediastinal contours are within normal limits. No focal opacities or effusions. No acute bony abnormality. Dental crown is visualized on the lower aspect of the image in the midline of the upper abdomen. IMPRESSION: Dental crown in the midline of the upper abdomen, likely in the distal stomach. No acute cardiopulmonary disease. Electronically Signed   By: Charlett Nose M.D.   On: 07/17/2022 17:10   DG Neck Soft Tissue  Result Date: 07/17/2022 CLINICAL DATA:  Swallowed foreign body, dental crown EXAM: NECK SOFT TISSUES - 1+ VIEW COMPARISON:  None Available. FINDINGS: Prevertebral soft tissues are unremarkable. No opaque foreign bodies are seen. Epiglottis is not thickened. There is no significant narrowing of airways. No focal abnormalities are seen in the visualized upper medial aspects of both lungs. IMPRESSION: No radiopaque foreign bodies are seen in the neck. Epiglottis is not thickened. There is no significant narrowing of the airways. Electronically Signed   By: Ernie Avena M.D.   On: 07/17/2022 17:07   DG Abd 2 Views  Result Date: 07/17/2022 CLINICAL DATA:  Swallowed dental crown EXAM: ABDOMEN - 2 VIEW COMPARISON:  None Available. FINDINGS: Scattered colonic stool. Gas is seen in nondilated loops of small and large bowel. There is a metallic focus overlying the distal stomach consistent with the patient's history of swallowed radiopaque foreign body. The extreme caudal aspect of  the pelvis is clipped off the edge of the film. IMPRESSION: Radiopaque foreign body overlying the distal stomach in the same which could be consistent with a dental crown. Nonspecific bowel gas pattern with scattered stool. Electronically Signed   By: Karen Kays M.D.   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     Follow-up: No follow-ups on file.  Sonda Primes, MD

## 2022-11-06 ENCOUNTER — Encounter (INDEPENDENT_AMBULATORY_CARE_PROVIDER_SITE_OTHER): Payer: Self-pay

## 2022-12-08 NOTE — Progress Notes (Unsigned)
Name: Marz Janice  Age/ Sex: 72 y.o., male   MRN/ DOB: [id number], 26-Aug-1950     PCP: Tresa Garter, MD   Reason for Endocrinology Evaluation: Type 2 Diabetes Mellitus  Initial Endocrine Consultative Visit: 02/12/2016    PATIENT IDENTIFIER: Christian Clements is a 72 y.o. male with a past medical history of DM, CVA, BPH and HTN. The patient has followed with Endocrinology clinic since 02/12/2016 for consultative assistance with management of his diabetes.  DIABETIC HISTORY:  Christian Clements was diagnosed with DM 2006, he took insulin for 1 month in 2017. His hemoglobin A1c has ranged from 5.9% in 2020, peaking at 7.9% in 2023.   SUBJECTIVE:   During the last visit (06/06/2022):A1c 7.3%  Today (12/09/2022): Christian Clements  is here for diabetes management.  He checks his blood sugars occasionally .The patient has not had hypoglycemic episodes since the last clinic visit.  He was recently evaluated by his PCP for memory issues Denies nausea or vomiting  Denies constipation or diarrhea     HOME DIABETES REGIMEN:  Metformin 500 mg,4 tabs daily  Rybelsus 7 mg daily      Statin: Yes ACE-I/ARB: No    METER DOWNLOAD SUMMARY: n/a    DIABETIC COMPLICATIONS: Microvascular complications:  Neuropathy Denies: CKD Last Eye Exam: Completed 2023  Macrovascular complications:  CVA Denies: CAD,  PVD   HISTORY:  Past Medical History:  Past Medical History:  Diagnosis Date   Diabetes mellitus    Hypertension    Prostatitis 2011   Stroke [facility])    Past Surgical History:  Past Surgical History:  Procedure Laterality Date   COLONOSCOPY     None     Social History:  reports that he has quit smoking. He has never used smokeless tobacco. He reports current alcohol use of about 3.0 standard drinks of alcohol per week. He reports that he does not use drugs. Family History:  Family History  Problem Relation Age of Onset   Cancer Mother 15       leukemia   Hypertension  Other    Diabetes Maternal Grandfather    Stroke Sister    Cancer Brother    HIV/AIDS Brother    Colon cancer Neg Hx    Esophageal cancer Neg Hx    Liver cancer Neg Hx    Rectal cancer Neg Hx    Stomach cancer Neg Hx    Colon polyps Neg Hx      HOME MEDICATIONS: Allergies as of 12/09/2022       Reactions   Accuretic [quinapril-hydrochlorothiazide]    Drug rash ?        Medication List        Accurate as of December 09, 2022 11:06 AM. If you have any questions, ask your nurse or doctor.          amLODipine 5 MG tablet Commonly known as: NORVASC Take 1 tablet (5 mg total) by mouth daily.   amLODipine-valsartan 5-320 MG tablet Commonly known as: EXFORGE TAKE 1 TABLET BY MOUTH EVERY DAY   aspirin 325 MG tablet Take 1 tablet (325 mg total) by mouth daily.   atorvastatin 20 MG tablet Commonly known as: LIPITOR TAKE 1 TABLET BY MOUTH EVERY DAY   b complex vitamins tablet Take 1 tablet by mouth daily.   baclofen 10 MG tablet Commonly known as: LIORESAL TAKE 1 TABLET (10 MG TOTAL) BY MOUTH 3 (THREE) TIMES DAILY. OK TO TAKE EXTRA DOSE AS NEEDED   blood  glucose meter kit and supplies Kit Dispense based on patient and insurance preference. Use two times daily as directed. (FOR ICD-9 250.00, 250.01).   Cholecalciferol 25 MCG (1000 UT) tablet Take 1,000 Units by mouth daily.   Clenpiq 10-3.5-12 MG-GM -GM/160ML Soln Generic drug: Sod Picosulfate-Mag Ox-Cit Acd Take 1 kit by mouth once for 1 dose.   docusate sodium 100 MG capsule Commonly known as: Colace Take 1 capsule (100 mg total) by mouth 2 (two) times daily.   donepezil 5 MG tablet Commonly known as: ARICEPT Take 1 tablet (5 mg total) by mouth at bedtime.   doxazosin 2 MG tablet Commonly known as: CARDURA TAKE 1 TABLET BY MOUTH EVERY DAY   glucose blood test strip Commonly known as: OneTouch Verio Use to check blood sugars twice a day Dx E11.9 Yearly physical w/labs are due must see MD for  refills   METAMUCIL PO Take by mouth 2 (two) times a day.   metFORMIN 500 MG 24 hr tablet Commonly known as: GLUCOPHAGE-XR Take 4 tablets (2,000 mg total) by mouth daily.   pantoprazole 40 MG tablet Commonly known as: PROTONIX TAKE 1 TAB BY MOUTH TWICE A DAY FOR TWO MONTHS AND THEN 40 MG BY MOUTH DAILY   Rybelsus 14 MG Tabs Generic drug: Semaglutide Take 1 tablet (14 mg total) by mouth daily.   sildenafil 100 MG tablet Commonly known as: VIAGRA Take 1 tablet (100 mg total) by mouth daily as needed.   triamcinolone ointment 0.5 % Commonly known as: KENALOG Apply 1 Application topically 4 (four) times daily.   valsartan 320 MG tablet Commonly known as: DIOVAN Take 1 tablet (320 mg total) by mouth daily.   zolpidem 10 MG tablet Commonly known as: AMBIEN Take 0.5-1 tablets (5-10 mg total) by mouth at bedtime as needed for sleep.         OBJECTIVE:   Vital Signs: Ht 5\' 10"  (1.778 m)   Wt 208 lb (94.3 kg)   BMI 29.84 kg/m   Wt Readings from Last 3 Encounters:  12/09/22 208 lb (94.3 kg)  10/21/22 214 lb (97.1 kg)  07/22/22 210 lb (95.3 kg)     Exam: General: Pt appears well and is in NAD  Lungs: Clear with good BS bilat   Heart: RRR   Extremities: No pretibial edema.   Neuro: MS is good with appropriate affect, pt is alert and Ox3    DM foot exam:06/06/2022  The skin of the feet is intact without sores or ulcerations. The pedal pulses are 2+ on right and 2+ on left. The sensation is absent to a screening 5.07, 10 gram monofilament bilaterally     DATA REVIEWED:  Lab Results  Component Value Date   HGBA1C 6.7 (H) 10/21/2022   HGBA1C 7.0 (H) 07/22/2022   HGBA1C 7.3 (H) 05/09/2022    Latest Reference Range & Units 10/21/22 10:10  Sodium 135 - 145 mEq/L 139  Potassium 3.5 - 5.1 mEq/L 4.0  Chloride 96 - 112 mEq/L 107  CO2 19 - 32 mEq/L 23  Glucose 70 - 99 mg/dL 102 (H)  BUN 6 - 23 mg/dL 12  Creatinine 7.25 - 3.66 mg/dL 4.40  Calcium 8.4 - 34.7  mg/dL 9.6  Alkaline Phosphatase 39 - 117 U/L 63  Albumin 3.5 - 5.2 g/dL 4.8  AST 0 - 37 U/L 17  ALT 0 - 53 U/L 21  Total Protein 6.0 - 8.3 g/dL 7.7  Total Bilirubin 0.2 - 1.2 mg/dL 0.7  GFR >42.59  mL/min 81.98    Latest Reference Range & Units 10/21/22 10:10  TSH 0.35 - 5.50 uIU/mL 2.62   In Office BG 142 mg/dL    Old records , labs and images have been reviewed.    ASSESSMENT / PLAN / RECOMMENDATIONS:   1) Type 2 Diabetes Mellitus, Optimally controlled, With neuropathic and macrovascular complications - Most recent A1c of 6.7 %. Goal A1c < 7.0 %.    - A1c at goal -I have encouraged the patient to continue with lifestyle changes -No changes to his medication regimen at this time   MEDICATIONS: Continue Metformin 500 mg 4 tabs daily  Continue Rybelsus 7 mg daily   EDUCATION / INSTRUCTIONS: BG monitoring instructions: Patient is instructed to check his blood sugars 2-3 times a week. Call [facility] if: BG persistently < 70  I reviewed the Rule of 15 for the treatment of hypoglycemia in detail with the patient. Literature supplied.    2) Diabetic complications:  Eye: Does not have known diabetic retinopathy.  Neuro/ Feet: Does  have known diabetic peripheral neuropathy .  Renal: Patient does not have known baseline CKD. He   is  on an ACEI/ARB at present.     F/U in 6 months     Signed electronically by: Lyndle Herrlich, MD  [facility] Endocrinology  [facility] [address] [place], [place] [address] Phone: [phone] FAX: [phone]   CC: Tresa Garter, MD [address] [place] [address] Phone: [phone]  Fax: [phone]  Return to Endocrinology clinic as below: Future Appointments  Date Time Provider Department Center  12/09/2022 11:10 AM Alysandra Lobue, Konrad Dolores, MD [facility] None  12/15/2022  2:00 PM [facility]-ANNUAL WELLNESS VISIT [facility]  02/23/2023 10:30 AM Nita Sickle  K, DO [facility] None  05/12/2023 10:10 AM Mallorey Odonell, Konrad Dolores, MD [facility] None

## 2022-12-09 ENCOUNTER — Encounter: Payer: Self-pay | Admitting: Internal Medicine

## 2022-12-09 ENCOUNTER — Ambulatory Visit: Payer: Medicare PPO | Admitting: Internal Medicine

## 2022-12-09 VITALS — Ht 70.0 in | Wt 208.0 lb

## 2022-12-09 DIAGNOSIS — E1159 Type 2 diabetes mellitus with other circulatory complications: Secondary | ICD-10-CM

## 2022-12-09 DIAGNOSIS — Z7984 Long term (current) use of oral hypoglycemic drugs: Secondary | ICD-10-CM | POA: Diagnosis not present

## 2022-12-09 LAB — POCT GLUCOSE (DEVICE FOR HOME USE): POC Glucose: 142 mg/dL — AB (ref 70–99)

## 2022-12-09 NOTE — Patient Instructions (Signed)
Continue Rybelsus 7 mg, 1 tablet before breakfast daily  Continue Metformin 500 mg 4 tablets daily     HOW TO TREAT LOW BLOOD SUGARS (Blood sugar LESS THAN 70 MG/DL) Please follow the RULE OF 15 for the treatment of hypoglycemia treatment (when your (blood sugars are less than 70 mg/dL)   STEP 1: Take 15 grams of carbohydrates when your blood sugar is low, which includes:  3-4 GLUCOSE TABS  OR 3-4 OZ OF JUICE OR REGULAR SODA OR ONE TUBE OF GLUCOSE GEL    STEP 2: RECHECK blood sugar in 15 MINUTES STEP 3: If your blood sugar is still low at the 15 minute recheck --> then, go back to STEP 1 and treat AGAIN with another 15 grams of carbohydrates.

## 2022-12-15 ENCOUNTER — Telehealth: Payer: Self-pay

## 2022-12-15 NOTE — Telephone Encounter (Signed)
Unsuccessful attempt to reach patient on preferred number listed in notes for scheduled AWV. Left message on voicemail okay to reschedule. 

## 2023-01-05 DIAGNOSIS — E119 Type 2 diabetes mellitus without complications: Secondary | ICD-10-CM | POA: Diagnosis not present

## 2023-01-05 DIAGNOSIS — H2513 Age-related nuclear cataract, bilateral: Secondary | ICD-10-CM | POA: Diagnosis not present

## 2023-01-05 DIAGNOSIS — H52223 Regular astigmatism, bilateral: Secondary | ICD-10-CM | POA: Diagnosis not present

## 2023-01-05 DIAGNOSIS — H353111 Nonexudative age-related macular degeneration, right eye, early dry stage: Secondary | ICD-10-CM | POA: Diagnosis not present

## 2023-01-05 DIAGNOSIS — H524 Presbyopia: Secondary | ICD-10-CM | POA: Diagnosis not present

## 2023-02-09 ENCOUNTER — Other Ambulatory Visit: Payer: Self-pay | Admitting: Internal Medicine

## 2023-02-23 ENCOUNTER — Ambulatory Visit: Payer: Medicare PPO | Admitting: Neurology

## 2023-02-23 ENCOUNTER — Encounter: Payer: Self-pay | Admitting: Neurology

## 2023-02-23 VITALS — BP 143/87 | HR 74 | Ht 70.0 in | Wt 210.0 lb

## 2023-02-23 DIAGNOSIS — G8111 Spastic hemiplegia affecting right dominant side: Secondary | ICD-10-CM

## 2023-02-23 DIAGNOSIS — I639 Cerebral infarction, unspecified: Secondary | ICD-10-CM | POA: Diagnosis not present

## 2023-02-23 NOTE — Patient Instructions (Signed)
It was great to see you today!    Start stretching program    Continue baclofen 10mg  three times daily

## 2023-02-23 NOTE — Progress Notes (Signed)
Follow-up Visit   Date: 02/23/23    Christian Clements MRN: [id number] DOB: 1950-10-19   Interim History: Christian Clements is a 72 y.o. 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  The patient was accompanied to the clinic by self.  IMPRESSION/PLAN: Right leg spasticity following stroke (2017), stable - Continue baclofen 10mg  TID - Encouraged daily stretching regimen  2.  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 Cardioembolic source was suspected given the involvement of 2 vascular territories, however cardiac monitoring was normal.   If he has any new events, he will need implantable loop recorder  - Continue aspirin 325 mg, Lipitor 20 mg, and BP medication as per primary  Return to clinic in 1 year   -------------------------------------- UPDATE 02/23/2023:  He is here for 1 year follow-up visit. Over the past month, he has been taking baclofen 10mg  twice daily and noticed that he was doing better when taking three times daily.  He continues to be active and goes to the gym 5 times daily.    Medications:  Current Outpatient Medications on File Prior to Visit  Medication Sig Dispense Refill   amLODipine (NORVASC) 5 MG tablet Take 1 tablet (5 mg total) by mouth daily. 90 tablet 3   amLODipine-valsartan (EXFORGE) 5-320 MG tablet TAKE 1 TABLET BY MOUTH EVERY DAY 90 tablet 3   aspirin 325 MG tablet Take 1 tablet (325 mg total) by mouth daily. 100 tablet 3   atorvastatin (LIPITOR) 20 MG tablet TAKE 1 TABLET BY MOUTH EVERY DAY 90 tablet 3   b complex vitamins tablet Take 1 tablet by mouth daily. 100 tablet 3   baclofen (LIORESAL) 10 MG tablet TAKE 1 TABLET (10 MG TOTAL) BY MOUTH 3 (THREE) TIMES DAILY. OK TO  TAKE EXTRA DOSE AS NEEDED 300 tablet 3   blood glucose meter kit and supplies KIT Dispense based on patient and insurance preference. Use two times daily as directed. (FOR ICD-9 250.00, 250.01). 1 each 0   Cholecalciferol 1000 UNITS tablet Take 1,000 Units by mouth daily.     docusate sodium (COLACE) 100 MG capsule Take 1 capsule (100 mg total) by mouth 2 (two) times daily. 60 capsule 11   donepezil (ARICEPT) 5 MG tablet Take 1 tablet (5 mg total) by mouth at bedtime. 90 tablet 1   doxazosin (CARDURA) 2 MG tablet TAKE 1 TABLET BY MOUTH EVERY DAY 90 tablet 3   glucose blood (ONETOUCH VERIO) test strip Use to check blood sugars twice a day Dx E11.9 Yearly physical w/labs are due must see MD for refills 100 each 0   metFORMIN (GLUCOPHAGE-XR) 500 MG 24 hr tablet Take 4 tablets (2,000 mg total) by mouth daily. 360 tablet 3   pantoprazole (PROTONIX) 40 MG tablet TAKE 1 TAB BY MOUTH TWICE A DAY FOR TWO MONTHS AND THEN 40 MG BY MOUTH DAILY 180 tablet 1   Psyllium (METAMUCIL PO) Take by mouth 2 (two) times a day.     Semaglutide (RYBELSUS) 14 MG TABS TAKE 1 TABLET (14 MG TOTAL) BY MOUTH DAILY 30 tablet 11   sildenafil (VIAGRA) 100 MG tablet Take 1 tablet (100 mg total) by mouth daily as needed. 10 tablet 11   Sod Picosulfate-Mag Ox-Cit Acd (CLENPIQ) 10-3.5-12 MG-GM -GM/160ML SOLN Take 1 kit by mouth once  for 1 dose. 354 mL 0   triamcinolone ointment (KENALOG) 0.5 % Apply 1 Application topically 4 (four) times daily. 120 g 1   valsartan (DIOVAN) 320 MG tablet Take 1 tablet (320 mg total) by mouth daily. 90 tablet 3   zolpidem (AMBIEN) 10 MG tablet Take 0.5-1 tablets (5-10 mg total) by mouth at bedtime as needed for sleep. 30 tablet 1   No current facility-administered medications on file prior to visit.    Allergies:  Allergies  Allergen Reactions   Accuretic [Quinapril-Hydrochlorothiazide]     Drug rash ?    Vital Signs:  BP (!) 143/87   Pulse 74   Ht 5\' 10"  (1.778 m)   Wt 210 lb (95.3 kg)    SpO2 97%   BMI 30.13 kg/m    Neurological Exam: MENTAL STATUS including orientation to time, place, person, recent and remote memory, attention span and concentration, language, and fund of knowledge is normal.  Speech is not dysarthric.  MOTOR:  Motor strength is 5/5 in all extremities, except right intrinsic hand muscles are 4+/5.  No atrophy, fasciculations or abnormal movements.  No pronator drift.  Tone is 0+ in the RLE.    COORDINATION/GAIT:  Finger and toe tapping is slowed on the right and there is mild dysmetria on the right.   Gait shows mild spasticity in the right leg, stable and unassisted  Data: MRI/A brain 01/04/2016: Acute sub cm infarction at the ventral pontomedullary junction just to the left of midline. Small acute left frontal subcortical white matter infarction. These 2 acute infarctions could be coincidental in due to ordinary small vessel disease, but emboli from the heart or ascending aorta could also result in this. Moderate chronic small-vessel ischemic changes of the cerebral hemispheric white matter for a age. Negative intracranial MR angiography of the large and medium size vessels.   Cardiac monitor 04/06/2016:  No arrythmia   Echo 01/06/2016:  Left ventricle: The cavity size was normal. Wall thickness was   normal. Systolic function was normal. The estimated ejection fraction was in the range of 55% to 60%.  No defect or patent foramen ovale was identified.   US carotids 01/05/2016:  1-39% bilateral ICA   Lab Results  Component Value Date   HGBA1C 6.7 (H) 10/21/2022   Lab Results  Component Value Date   LDLCALC 21 08/27/2021      Thank you for allowing me to participate in patient's care.  If I can answer any additional questions, I would be pleased to do so.    Sincerely,    Gillie Crisci K. Allena Katz, DO

## 2023-02-28 ENCOUNTER — Other Ambulatory Visit: Payer: Self-pay | Admitting: Internal Medicine

## 2023-04-09 ENCOUNTER — Other Ambulatory Visit: Payer: Self-pay | Admitting: Internal Medicine

## 2023-05-12 ENCOUNTER — Encounter: Payer: Self-pay | Admitting: Internal Medicine

## 2023-05-12 ENCOUNTER — Ambulatory Visit: Payer: Medicare PPO | Admitting: Internal Medicine

## 2023-05-12 VITALS — BP 136/74 | HR 81 | Ht 70.0 in | Wt 214.0 lb

## 2023-05-12 DIAGNOSIS — E1142 Type 2 diabetes mellitus with diabetic polyneuropathy: Secondary | ICD-10-CM

## 2023-05-12 DIAGNOSIS — E1159 Type 2 diabetes mellitus with other circulatory complications: Secondary | ICD-10-CM

## 2023-05-12 DIAGNOSIS — Z7984 Long term (current) use of oral hypoglycemic drugs: Secondary | ICD-10-CM | POA: Diagnosis not present

## 2023-05-12 LAB — POCT GLUCOSE (DEVICE FOR HOME USE): POC Glucose: 123 mg/dL — AB (ref 70–99)

## 2023-05-12 LAB — POCT GLYCOSYLATED HEMOGLOBIN (HGB A1C): Hemoglobin A1C: 6.7 % — AB (ref 4.0–5.6)

## 2023-05-12 MED ORDER — RYBELSUS 14 MG PO TABS: 14.0000 mg | ORAL_TABLET | Freq: Every day | ORAL | 3 refills | Status: DC

## 2023-05-12 MED ORDER — METFORMIN HCL ER 500 MG PO TB24: 2000.0000 mg | ORAL_TABLET | Freq: Every day | ORAL | 3 refills | Status: DC

## 2023-05-12 NOTE — Progress Notes (Signed)
Name: Christian Clements  Age/ Sex: 73 y.o., male   MRN/ DOB: [id number], [birth year]     PCP: Tresa Garter, MD   Reason for Endocrinology Evaluation: Type 2 Diabetes Mellitus  Initial Endocrine Consultative Visit: 02/12/2016    PATIENT IDENTIFIER: Christian Clements is a 73 y.o. male with a past medical history of DM, CVA, BPH and HTN. The patient has followed with Endocrinology clinic since 02/12/2016 for consultative assistance with management of his diabetes.  DIABETIC HISTORY:  Christian Clements was diagnosed with DM 2006, he took insulin for 1 month in 2017. His hemoglobin A1c has ranged from 5.9% in 2020, peaking at 7.9% in 2023.   SUBJECTIVE:   During the last visit (12/09/2022):A1c 6.7%     Today (05/12/2023): Christian Clements  is here for diabetes management.  He checks his blood sugars occasionally .The patient has not had hypoglycemic episodes since the last clinic visit.  Patient follows with neurology for right leg spasticity following stroke in 2017, patient on baclofen   Denies nausea or vomiting  Denies constipation or diarrhea     HOME DIABETES REGIMEN:  Metformin 500 mg,4 tabs daily  Rybelsus 14 mg daily      Statin: Yes ACE-I/ARB: No    METER DOWNLOAD SUMMARY: n/a    DIABETIC COMPLICATIONS: Microvascular complications:  Neuropathy Denies: CKD Last Eye Exam: Completed 01/05/2023  Macrovascular complications:  CVA Denies: CAD,  PVD   HISTORY:  Past Medical History:  Past Medical History:  Diagnosis Date   Diabetes mellitus    Hypertension    Prostatitis 2011   Stroke [facility])    Past Surgical History:  Past Surgical History:  Procedure Laterality Date   COLONOSCOPY     None     Social History:  reports that he has quit smoking. He has never used smokeless tobacco. He reports current alcohol use of about 3.0 standard drinks of alcohol per week. He reports that he does not use drugs. Family History:  Family History  Problem Relation  Age of Onset   Cancer Mother 33       leukemia   Hypertension Other    Diabetes Maternal Grandfather    Stroke Sister    Cancer Brother    HIV/AIDS Brother    Colon cancer Neg Hx    Esophageal cancer Neg Hx    Liver cancer Neg Hx    Rectal cancer Neg Hx    Stomach cancer Neg Hx    Colon polyps Neg Hx      HOME MEDICATIONS: Allergies as of 05/12/2023       Reactions   Accuretic [quinapril-hydrochlorothiazide]    Drug rash ?        Medication List        Accurate as of May 12, 2023 10:20 AM. If you have any questions, ask your nurse or doctor.          amLODipine 5 MG tablet Commonly known as: NORVASC Take 1 tablet (5 mg total) by mouth daily.   amLODipine-valsartan 5-320 MG tablet Commonly known as: EXFORGE TAKE 1 TABLET BY MOUTH EVERY DAY   aspirin 325 MG tablet Take 1 tablet (325 mg total) by mouth daily.   atorvastatin 20 MG tablet Commonly known as: LIPITOR TAKE 1 TABLET BY MOUTH EVERY DAY   b complex vitamins tablet Take 1 tablet by mouth daily.   baclofen 10 MG tablet Commonly known as: LIORESAL TAKE 1 TABLET (10 MG TOTAL) BY MOUTH 3 (THREE) TIMES DAILY.  OK TO TAKE EXTRA DOSE AS NEEDED   blood glucose meter kit and supplies Kit Dispense based on patient and insurance preference. Use two times daily as directed. (FOR ICD-9 250.00, 250.01).   Cholecalciferol 25 MCG (1000 UT) tablet Take 1,000 Units by mouth daily.   Clenpiq 10-3.5-12 MG-GM -GM/160ML Soln Generic drug: Sod Picosulfate-Mag Ox-Cit Acd Take 1 kit by mouth once for 1 dose.   docusate sodium 100 MG capsule Commonly known as: Colace Take 1 capsule (100 mg total) by mouth 2 (two) times daily.   donepezil 5 MG tablet Commonly known as: ARICEPT Take 1 tablet (5 mg total) by mouth at bedtime.   doxazosin 2 MG tablet Commonly known as: CARDURA TAKE 1 TABLET BY MOUTH EVERY DAY   glucose blood test strip Commonly known as: OneTouch Verio Use to check blood sugars twice a day  Dx E11.9 Yearly physical w/labs are due must see MD for refills   METAMUCIL PO Take by mouth 2 (two) times a day.   metFORMIN 500 MG 24 hr tablet Commonly known as: GLUCOPHAGE-XR Take 4 tablets (2,000 mg total) by mouth daily.   pantoprazole 40 MG tablet Commonly known as: PROTONIX TAKE 1 TAB BY MOUTH TWICE A DAY FOR TWO MONTHS AND THEN 40 MG BY MOUTH DAILY   Rybelsus 14 MG Tabs Generic drug: Semaglutide Take 1 tablet (14 mg total) by mouth daily.   sildenafil 100 MG tablet Commonly known as: VIAGRA Take 1 tablet (100 mg total) by mouth daily as needed.   triamcinolone ointment 0.5 % Commonly known as: KENALOG Apply 1 Application topically 4 (four) times daily.   valsartan 320 MG tablet Commonly known as: DIOVAN Take 1 tablet (320 mg total) by mouth daily.   zolpidem 10 MG tablet Commonly known as: AMBIEN Take 0.5-1 tablets (5-10 mg total) by mouth at bedtime as needed for sleep.         OBJECTIVE:   Vital Signs: BP 136/74 (BP Location: Left Arm, Patient Position: Sitting, Cuff Size: Small)   Pulse 81   Ht 5\' 10"  (1.778 m)   Wt 214 lb (97.1 kg)   SpO2 98%   BMI 30.71 kg/m   Wt Readings from Last 3 Encounters:  05/12/23 214 lb (97.1 kg)  02/23/23 210 lb (95.3 kg)  12/09/22 208 lb (94.3 kg)     Exam: General: Pt appears well and is in NAD  Lungs: Clear with good BS bilat   Heart: RRR   Extremities: No pretibial edema.   Neuro: MS is good with appropriate affect, pt is alert and Ox3    DM foot exam:05/12/2023  The skin of the feet is intact without sores or ulcerations, discolored toenails noted The pedal pulses are 2+ on right and 2+ on left. The sensation is absent to a screening 5.07, 10 gram monofilament bilaterally     DATA REVIEWED:  Lab Results  Component Value Date   HGBA1C 6.7 (A) 05/12/2023   HGBA1C 6.7 (H) 10/21/2022   HGBA1C 7.0 (H) 07/22/2022    Latest Reference Range & Units 10/21/22 10:10  Sodium 135 - 145 mEq/L 139   Potassium 3.5 - 5.1 mEq/L 4.0  Chloride 96 - 112 mEq/L 107  CO2 19 - 32 mEq/L 23  Glucose 70 - 99 mg/dL 161 (H)  BUN 6 - 23 mg/dL 12  Creatinine 0.96 - 0.45 mg/dL 4.09  Calcium 8.4 - 81.1 mg/dL 9.6  Alkaline Phosphatase 39 - 117 U/L 63  Albumin 3.5 - 5.2  g/dL 4.8  AST 0 - 37 U/L 17  ALT 0 - 53 U/L 21  Total Protein 6.0 - 8.3 g/dL 7.7  Total Bilirubin 0.2 - 1.2 mg/dL 0.7  GFR >16.10 mL/min 81.98    Latest Reference Range & Units 10/21/22 10:10  TSH 0.35 - 5.50 uIU/mL 2.62    Old records , labs and images have been reviewed.    ASSESSMENT / PLAN / RECOMMENDATIONS:   1) Type 2 Diabetes Mellitus, Optimally controlled, With neuropathic and macrovascular complications - Most recent A1c of 6.7 %. Goal A1c < 7.0 %.    - A1c at goal -Tolerating medications without side effects -No changes  -Due to neuropathy, patient was advised to inspect feet multiple times a day   MEDICATIONS: Continue Metformin 500 mg 4 tabs daily  Continue Rybelsus 14 mg daily   EDUCATION / INSTRUCTIONS: BG monitoring instructions: Patient is instructed to check his blood sugars 2-3 times a week. Call [facility] if: BG persistently < 70  I reviewed the Rule of 15 for the treatment of hypoglycemia in detail with the patient. Literature supplied.    2) Diabetic complications:  Eye: Does not have known diabetic retinopathy.  Neuro/ Feet: Does  have known diabetic peripheral neuropathy .  Renal: Patient does not have known baseline CKD. He   is  on an ACEI/ARB at present.     F/U in 6 months     Signed electronically by: Lyndle Herrlich, MD  [facility] Endocrinology  [facility] [address] [place] [place], [place] [address] Phone: [phone] FAX: [phone]   CC: Tresa Garter, MD [address] [place] [address] Phone: [phone]  Fax: [phone]  Return to Endocrinology clinic as below: Future Appointments  Date Time  Provider Department Center  06/10/2023  3:00 PM [facility]-ANNUAL WELLNESS VISIT 2 [facility] None  02/23/2024 10:30 AM Nita Sickle K, DO [facility] None

## 2023-05-12 NOTE — Patient Instructions (Addendum)
Continue Rybelsus 14 mg, 1 tablet before breakfast daily  Continue Metformin 500 mg 4 tablets daily     HOW TO TREAT LOW BLOOD SUGARS (Blood sugar LESS THAN 70 MG/DL) Please follow the RULE OF 15 for the treatment of hypoglycemia treatment (when your (blood sugars are less than 70 mg/dL)   STEP 1: Take 15 grams of carbohydrates when your blood sugar is low, which includes:  3-4 GLUCOSE TABS  OR 3-4 OZ OF JUICE OR REGULAR SODA OR ONE TUBE OF GLUCOSE GEL    STEP 2: RECHECK blood sugar in 15 MINUTES STEP 3: If your blood sugar is still low at the 15 minute recheck --> then, go back to STEP 1 and treat AGAIN with another 15 grams of carbohydrates.

## 2023-06-10 ENCOUNTER — Ambulatory Visit: Payer: Medicare PPO

## 2023-06-10 VITALS — Ht 70.0 in | Wt 214.0 lb

## 2023-06-10 DIAGNOSIS — Z0000 Encounter for general adult medical examination without abnormal findings: Secondary | ICD-10-CM | POA: Diagnosis not present

## 2023-06-10 NOTE — Progress Notes (Cosign Needed Addendum)
 Subjective:   Christian Clements is a 73 y.o. who presents for a Medicare Wellness preventive visit.  Visit Complete: Virtual I connected with  Christian Clements on 06/10/23 by a audio enabled telemedicine application and verified that I am speaking with the correct person using two identifiers.  Patient Location: Home  Provider Location: Home Office  I discussed the limitations of evaluation and management by telemedicine. The patient expressed understanding and agreed to proceed.  Vital Signs: Because this visit was a virtual/telehealth visit, some criteria may be missing or patient reported. Any vitals not documented were not able to be obtained and vitals that have been documented are patient reported.  VideoDeclined- This patient declined Librarian, academic. Therefore the visit was completed with audio only.  Persons Participating in Visit: Patient.  AWV Questionnaire: No: Patient Medicare AWV questionnaire was not completed prior to this visit.  Cardiac Risk Factors include: advanced age (>22men, >17 women);diabetes mellitus;hypertension;dyslipidemia;Other (see comment), Risk factor comments: BPH     Objective:    Today's Vitals   06/10/23 1452  Weight: 214 lb (97.1 kg)  Height: 5\' 10"  (1.778 m)   Body mass index is 30.71 kg/m.     06/10/2023    3:03 PM 07/17/2022    3:41 PM 02/21/2022   10:33 AM 12/13/2021    3:12 PM 07/01/2021   10:26 AM 12/31/2020   10:27 AM 06/29/2020   10:26 AM  Advanced Directives  Does Patient Have a Medical Advance Directive? No No No No No No No  Would patient like information on creating a medical advance directive?  No - Patient declined  No - Patient declined       Current Medications (verified) Outpatient Encounter Medications as of 06/10/2023  Medication Sig   amLODipine (NORVASC) 5 MG tablet Take 1 tablet (5 mg total) by mouth daily.   amLODipine-valsartan (EXFORGE) 5-320 MG tablet TAKE 1 TABLET BY MOUTH EVERY  DAY   aspirin 325 MG tablet Take 1 tablet (325 mg total) by mouth daily.   atorvastatin (LIPITOR) 20 MG tablet TAKE 1 TABLET BY MOUTH EVERY DAY   b complex vitamins tablet Take 1 tablet by mouth daily.   baclofen (LIORESAL) 10 MG tablet TAKE 1 TABLET (10 MG TOTAL) BY MOUTH 3 (THREE) TIMES DAILY. OK TO TAKE EXTRA DOSE AS NEEDED   blood glucose meter kit and supplies KIT Dispense based on patient and insurance preference. Use two times daily as directed. (FOR ICD-9 250.00, 250.01).   Cholecalciferol 1000 UNITS tablet Take 1,000 Units by mouth daily.   docusate sodium (COLACE) 100 MG capsule Take 1 capsule (100 mg total) by mouth 2 (two) times daily.   donepezil (ARICEPT) 5 MG tablet Take 1 tablet (5 mg total) by mouth at bedtime.   doxazosin (CARDURA) 2 MG tablet TAKE 1 TABLET BY MOUTH EVERY DAY   glucose blood (ONETOUCH VERIO) test strip Use to check blood sugars twice a day Dx E11.9 Yearly physical w/labs are due must see MD for refills   metFORMIN (GLUCOPHAGE-XR) 500 MG 24 hr tablet Take 4 tablets (2,000 mg total) by mouth daily.   pantoprazole (PROTONIX) 40 MG tablet TAKE 1 TAB BY MOUTH TWICE A DAY FOR TWO MONTHS AND THEN 40 MG BY MOUTH DAILY   Psyllium (METAMUCIL PO) Take by mouth 2 (two) times a day.   Semaglutide (RYBELSUS) 14 MG TABS Take 1 tablet (14 mg total) by mouth daily.   sildenafil (VIAGRA) 100 MG tablet Take 1 tablet (  100 mg total) by mouth daily as needed.   Sod Picosulfate-Mag Ox-Cit Acd (CLENPIQ) 10-3.5-12 MG-GM -GM/160ML SOLN Take 1 kit by mouth once for 1 dose.   triamcinolone ointment (KENALOG) 0.5 % Apply 1 Application topically 4 (four) times daily.   valsartan (DIOVAN) 320 MG tablet Take 1 tablet (320 mg total) by mouth daily.   zolpidem (AMBIEN) 10 MG tablet Take 0.5-1 tablets (5-10 mg total) by mouth at bedtime as needed for sleep.   No facility-administered encounter medications on file as of 06/10/2023.    Allergies (verified) Accuretic  [quinapril-hydrochlorothiazide]   History: Past Medical History:  Diagnosis Date   Diabetes mellitus    Hypertension    Prostatitis 2011   Stroke [facility])    Past Surgical History:  Procedure Laterality Date   COLONOSCOPY     None     Family History  Problem Relation Age of Onset   Cancer Mother 35       leukemia   Hypertension Other    Diabetes Maternal Grandfather    Stroke Sister    Cancer Brother    HIV/AIDS Brother    Colon cancer Neg Hx    Esophageal cancer Neg Hx    Liver cancer Neg Hx    Rectal cancer Neg Hx    Stomach cancer Neg Hx    Colon polyps Neg Hx    Social History   Socioeconomic History   Marital status: Married    Spouse name: Not on file   Number of children: 0   Years of education: 16   Highest education level: Master's degree (e.g., MA, MS, MEng, MEd, MSW, MBA)  Occupational History   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  Lives with wife in a 2 story home.  Has no children.     Retired Optometrist.     Education: college.    Right Handed      Social Drivers of Health   Financial Resource Strain: Low Risk  (06/10/2023)   Overall Financial Resource Strain (CARDIA)    Difficulty of Paying Living Expenses: Not hard at all  Food Insecurity: No Food Insecurity (06/10/2023)   Hunger Vital Sign    Worried About Running Out of Food in the Last Year: Never true    Ran Out of Food in the Last Year: Never true  Transportation Needs: No Transportation Needs (06/10/2023)   PRAPARE - Administrator, Civil Service (Medical):  No    Lack of Transportation (Non-Medical): No  Physical Activity: Sufficiently Active (06/10/2023)   Exercise Vital Sign    Days of Exercise per Week: 6 days    Minutes of Exercise per Session: 60 min  Stress: No Stress Concern Present (06/10/2023)   [employer] of Occupational Health - Occupational Stress Questionnaire    Feeling of Stress : Not at all  Social Connections: Moderately Isolated (06/10/2023)   Social Connection and Isolation Panel [NHANES]    Frequency of Communication with Friends and  Family: More than three times a week    Frequency of Social Gatherings with Friends and Family: More than three times a week    Attends Religious Services: Never    Database administrator or Organizations: No    Attends Engineer, structural: Never    Marital Status: Married    Tobacco Counseling Counseling given: Not Answered    Clinical Intake:  Pre-visit preparation completed: Yes  Pain : No/denies pain     BMI - recorded: 30.71 Nutritional Status: BMI > 30  Obese Nutritional Risks: None Diabetes: Yes CBG done?: No Did pt. bring in CBG monitor from home?: No  Lab Results  Component Value Date   HGBA1C 6.7 (A) 05/12/2023   HGBA1C 6.7 (H) 10/21/2022   HGBA1C 7.0 (H) 07/22/2022     How often do you need to have someone help you when you read instructions, pamphlets, or other written materials from your doctor or pharmacy?: 1 - Never  Interpreter Needed?: No  Information entered by :: Angle Karel, RMA   Activities of Daily Living     06/10/2023    2:53 PM  In your present state of health, do you have any difficulty performing the following activities:  Hearing? 0  Vision? 0  Difficulty concentrating or making decisions? 0  Walking or climbing stairs? 0  Dressing or bathing? 0  Doing errands, shopping? 0  Preparing Food and eating ? N  Using the Toilet? N  In the past six months, have you accidently leaked urine? N  Do you have problems with  loss of bowel control? N  Managing your Medications? N  Managing your Finances? N  Housekeeping or managing your Housekeeping? N    Patient Care Team: Plotnikov, Georgina Quint, MD as PCP - General Romero Belling, MD (Inactive) as Consulting Physician (Endocrinology) Glendale Chard, DO as Consulting Physician (Neurology) Durene Romans, MD as Consulting Physician (Orthopedic Surgery) [facility]  Indicate any recent Medical Services you may have received from other than [facility] providers in the past year (date may be approximate).     Assessment:   This is a routine wellness examination for Zaleski.  Hearing/Vision screen Hearing Screening - Comments:: Denies hearing difficulties   Vision Screening - Comments:: Wear eyeglasses   Goals Addressed             This Visit's Progress    DIET - INCREASE WATER INTAKE   On track      Depression Screen    06/10/2023    3:14 PM 07/22/2022    8:59 AM 04/23/2022    9:32 AM 12/13/2021    3:11 PM 05/17/2020    8:41 AM 02/14/2019    8:07 AM 03/12/2017    7:48 AM  PHQ 2/9 Scores  PHQ - 2 Score 0 0 0 0 0 0 0  PHQ- 9 Score 0  3  0      Fall Risk     06/10/2023    3:03 PM 07/22/2022    8:59 AM 04/23/2022    9:31 AM 02/21/2022   10:33 AM 12/13/2021    3:08 PM  Fall Risk   Falls in the past year? 0 0 0 0 0  Number falls in past yr: 0 0 0 0 0  Injury with Fall? 0 0 0 0 0  Risk for fall due to : No Fall Risks No Fall Risks No Fall Risks  No Fall Risks  Follow up Falls prevention  discussed;Falls evaluation completed Falls evaluation completed Falls evaluation completed Falls evaluation completed Falls prevention discussed    MEDICARE RISK AT HOME:  Medicare Risk at Home Any stairs in or around the home?: Yes If so, are there any without handrails?: Yes Home free of loose throw rugs in walkways, pet beds, electrical cords, etc?: Yes Adequate lighting in your home to reduce risk of falls?: Yes Life alert?: No Use of a  cane, walker or w/c?: No Grab bars in the bathroom?: Yes Shower chair or bench in shower?: Yes Elevated toilet seat or a handicapped toilet?: Yes  TIMED UP AND GO:  Was the test performed?  No  Cognitive Function: Normal: Normal cognitive status assessed by direct observation by this Clinical Health Advisor. No abnormalities found. Patient is able to answer questions in an accurate and timely manner.      12/11/2017   11:14 AM  Montreal Cognitive Assessment   Visuospatial/ Executive (0/5) 5  Naming (0/3) 3  Attention: Read list of digits (0/2) 2  Attention: Read list of letters (0/1) 1  Attention: Serial 7 subtraction starting at 100 (0/3) 2  Language: Repeat phrase (0/2) 2  Language : Fluency (0/1) 1  Abstraction (0/2) 1  Delayed Recall (0/5) 0  Orientation (0/6) 6  Total 23  Adjusted Score (based on education) 23      12/13/2021    3:13 PM  6CIT Screen  What Year? 0 points  What month? 0 points  What time? 0 points  Count back from 20 0 points  Months in reverse 0 points  Repeat phrase 0 points  Total Score 0 points    Immunizations Immunization History  Administered Date(s) Administered   Fluad Quad(high Dose 65+) 02/15/2020, 01/02/2021   PFIZER Comirnaty(Gray Top)Covid-19 Tri-Sucrose Vaccine 07/21/2020   PFIZER(Purple Top)SARS-COV-2 Vaccination 04/17/2019, 05/09/2019, 12/27/2019    Screening Tests Health Maintenance  Topic Date Due   Pneumonia Vaccine 59+ Years old (1 of 2 - PCV) Never done   Hepatitis C Screening  Never done   DTaP/Tdap/Td (1 - Tdap) Never done   INFLUENZA VACCINE  10/23/2022   COVID-19 Vaccine (5 - 2024-25 season) 11/23/2022   Diabetic kidney evaluation - Urine ACR  07/22/2023   Diabetic kidney evaluation - eGFR measurement  10/21/2023   HEMOGLOBIN A1C  11/09/2023   OPHTHALMOLOGY EXAM  01/05/2024   FOOT EXAM  05/11/2024   Medicare Annual Wellness (AWV)  06/09/2024   Colonoscopy  04/01/2032   HPV VACCINES  Aged Out   Zoster  Vaccines- Shingrix  Discontinued    Health Maintenance  Health Maintenance Due  Topic Date Due   Pneumonia Vaccine 59+ Years old (1 of 2 - PCV) Never done   Hepatitis C Screening  Never done   DTaP/Tdap/Td (1 - Tdap) Never done   INFLUENZA VACCINE  10/23/2022   COVID-19 Vaccine (5 - 2024-25 season) 11/23/2022   Health Maintenance Items Addressed: See Nurse Notes  Additional Screening:  Vision Screening: Recommended annual ophthalmology exams for early detection of glaucoma and other disorders of the eye.  Dental Screening: Recommended annual dental exams for proper oral hygiene  Community Resource Referral / Chronic Care Management: CRR required this visit?  No   CCM required this visit?  No     Plan:     I have personally reviewed and noted the following in the patient's chart:   Medical and social history Use of alcohol, tobacco or illicit drugs  Current medications and supplements including opioid prescriptions.  Patient is not currently taking opioid prescriptions. Functional ability and status Nutritional status Physical activity Advanced directives List of other physicians Hospitalizations, surgeries, and ER visits in previous 12 months Vitals Screenings to include cognitive, depression, and falls Referrals and appointments  In addition, I have reviewed and discussed with patient certain preventive protocols, quality metrics, and best practice recommendations. A written personalized care plan for preventive services as well as general preventive health recommendations were provided to patient.     Tzivia Oneil L Tamea Bai, CMA   06/10/2023   After Visit Summary: (MyChart) Due to this being a telephonic visit, the after visit summary with patients personalized plan was offered to patient via MyChart   Notes: Please refer to Routing Comments.  Medical screening examination/treatment/procedure(s) were performed by non-physician practitioner and as supervising  physician I was immediately available for consultation/collaboration.  I agree with above. Jacinta Shoe, MD

## 2023-06-10 NOTE — Patient Instructions (Signed)
 Mr. Christian Clements , Thank you for taking time to come for your Medicare Wellness Visit. I appreciate your ongoing commitment to your health goals. Please review the following plan we discussed and let me know if I can assist you in the future.   Referrals/Orders/Follow-Ups/Clinician Recommendations: Keep up the good work.  This is a list of the screening recommended for you and due dates:  Health Maintenance  Topic Date Due   Pneumonia Vaccine (1 of 2 - PCV) Never done   Hepatitis C Screening  Never done   DTaP/Tdap/Td vaccine (1 - Tdap) Never done   Flu Shot  10/23/2022   COVID-19 Vaccine (5 - 2024-25 season) 11/23/2022   Yearly kidney health urinalysis for diabetes  07/22/2023   Yearly kidney function blood test for diabetes  10/21/2023   Hemoglobin A1C  11/09/2023   Eye exam for diabetics  01/05/2024   Complete foot exam   05/11/2024   Medicare Annual Wellness Visit  06/09/2024   Colon Cancer Screening  04/01/2032   HPV Vaccine  Aged Out   Zoster (Shingles) Vaccine  Discontinued    Advanced directives: (Declined) Advance directive discussed with you today. Even though you declined this today, please call our office should you change your mind, and we can give you the proper paperwork for you to fill out.  Next Medicare Annual Wellness Visit scheduled for next year: Yes

## 2023-07-06 ENCOUNTER — Other Ambulatory Visit: Payer: Self-pay | Admitting: Internal Medicine

## 2023-08-03 ENCOUNTER — Ambulatory Visit: Admitting: Internal Medicine

## 2023-08-21 ENCOUNTER — Ambulatory Visit: Admitting: Internal Medicine

## 2023-08-21 ENCOUNTER — Encounter: Payer: Self-pay | Admitting: Internal Medicine

## 2023-08-21 VITALS — BP 130/82 | HR 65 | Temp 97.7°F | Ht 70.0 in | Wt 209.6 lb

## 2023-08-21 DIAGNOSIS — E1159 Type 2 diabetes mellitus with other circulatory complications: Secondary | ICD-10-CM

## 2023-08-21 DIAGNOSIS — G8191 Hemiplegia, unspecified affecting right dominant side: Secondary | ICD-10-CM

## 2023-08-21 DIAGNOSIS — R1310 Dysphagia, unspecified: Secondary | ICD-10-CM | POA: Diagnosis not present

## 2023-08-21 DIAGNOSIS — K2960 Other gastritis without bleeding: Secondary | ICD-10-CM | POA: Diagnosis not present

## 2023-08-21 DIAGNOSIS — E1169 Type 2 diabetes mellitus with other specified complication: Secondary | ICD-10-CM

## 2023-08-21 DIAGNOSIS — K219 Gastro-esophageal reflux disease without esophagitis: Secondary | ICD-10-CM | POA: Diagnosis not present

## 2023-08-21 DIAGNOSIS — R972 Elevated prostate specific antigen [PSA]: Secondary | ICD-10-CM | POA: Diagnosis not present

## 2023-08-21 DIAGNOSIS — Z7984 Long term (current) use of oral hypoglycemic drugs: Secondary | ICD-10-CM | POA: Diagnosis not present

## 2023-08-21 DIAGNOSIS — E785 Hyperlipidemia, unspecified: Secondary | ICD-10-CM | POA: Diagnosis not present

## 2023-08-21 DIAGNOSIS — I69359 Hemiplegia and hemiparesis following cerebral infarction affecting unspecified side: Secondary | ICD-10-CM

## 2023-08-21 LAB — COMPREHENSIVE METABOLIC PANEL WITH GFR
ALT: 23 U/L (ref 0–53)
AST: 16 U/L (ref 0–37)
Albumin: 5 g/dL (ref 3.5–5.2)
Alkaline Phosphatase: 66 U/L (ref 39–117)
BUN: 16 mg/dL (ref 6–23)
CO2: 25 meq/L (ref 19–32)
Calcium: 10 mg/dL (ref 8.4–10.5)
Chloride: 103 meq/L (ref 96–112)
Creatinine, Ser: 0.97 mg/dL (ref 0.40–1.50)
GFR: 77.49 mL/min (ref >60.00)
Glucose, Bld: 107 mg/dL — ABNORMAL HIGH (ref 70–99)
Potassium: 4.2 meq/L (ref 3.5–5.1)
Sodium: 138 meq/L (ref 135–145)
Total Bilirubin: 1 mg/dL (ref 0.2–1.2)
Total Protein: 7.5 g/dL (ref 6.0–8.3)

## 2023-08-21 LAB — PSA: PSA: 6.89 ng/mL — ABNORMAL HIGH (ref 0.10–4.00)

## 2023-08-21 LAB — HEMOGLOBIN A1C: Hgb A1c MFr Bld: 7 % — ABNORMAL HIGH (ref 4.6–6.5)

## 2023-08-21 MED ORDER — ATORVASTATIN CALCIUM 20 MG PO TABS: 20.0000 mg | ORAL_TABLET | Freq: Every day | ORAL | 3 refills | Status: AC

## 2023-08-21 MED ORDER — DOXAZOSIN MESYLATE 2 MG PO TABS: 2.0000 mg | ORAL_TABLET | Freq: Every day | ORAL | 3 refills | Status: AC

## 2023-08-21 MED ORDER — AMLODIPINE BESYLATE-VALSARTAN 5-320 MG PO TABS: 1.0000 | ORAL_TABLET | Freq: Every day | ORAL | 3 refills | Status: AC

## 2023-08-21 MED ORDER — METFORMIN HCL ER 500 MG PO TB24: 2000.0000 mg | ORAL_TABLET | Freq: Every day | ORAL | 3 refills | Status: AC

## 2023-08-21 MED ORDER — PANTOPRAZOLE SODIUM 40 MG PO TBEC: DELAYED_RELEASE_TABLET | ORAL | 1 refills | Status: DC

## 2023-08-21 MED ORDER — RYBELSUS 14 MG PO TABS: 14.0000 mg | ORAL_TABLET | Freq: Every day | ORAL | 3 refills | Status: AC

## 2023-08-21 MED ORDER — ZOLPIDEM TARTRATE 10 MG PO TABS: 5.0000 mg | ORAL_TABLET | Freq: Every evening | ORAL | 1 refills | Status: AC | PRN

## 2023-08-21 NOTE — Assessment & Plan Note (Signed)
Chronic. Cont on Lipitor 

## 2023-08-21 NOTE — Assessment & Plan Note (Signed)
Chronic. Exercising a lot - gym 5-6 d/wk

## 2023-08-21 NOTE — Assessment & Plan Note (Signed)
 No change Cont on ASA, Lipitor, Exforge 

## 2023-08-21 NOTE — Assessment & Plan Note (Signed)
  Cont on Metformin, Rybelsus

## 2023-08-21 NOTE — Progress Notes (Signed)
 Subjective:  Patient ID: Christian Clements, male    DOB: 1950-06-30  Age: 73 y.o. MRN: [id number]  CC: Annual Exam (Annual Exam)   HPI Christian Clements presents for DM, HTN, CVA w/R side weakness  Outpatient Medications Prior to Visit  Medication Sig Dispense Refill   aspirin  325 MG tablet Take 1 tablet (325 mg total) by mouth daily. 100 tablet 3   b complex vitamins tablet Take 1 tablet by mouth daily. 100 tablet 3   baclofen  (LIORESAL ) 10 MG tablet TAKE 1 TABLET (10 MG TOTAL) BY MOUTH 3 (THREE) TIMES DAILY. OK TO TAKE EXTRA DOSE AS NEEDED 300 tablet 3   blood glucose meter kit and supplies KIT Dispense based on patient and insurance preference. Use two times daily as directed. (FOR ICD-9 250.00, 250.01). 1 each 0   Cholecalciferol 1000 UNITS tablet Take 1,000 Units by mouth daily.     docusate sodium  (COLACE) 100 MG capsule Take 1 capsule (100 mg total) by mouth 2 (two) times daily. 60 capsule 11   donepezil  (ARICEPT ) 5 MG tablet Take 1 tablet (5 mg total) by mouth at bedtime. 90 tablet 1   glucose blood (ONETOUCH VERIO) test strip Use to check blood sugars twice a day Dx E11.9 Yearly physical w/labs are due must see MD for refills 100 each 0   Psyllium (METAMUCIL PO) Take by mouth 2 (two) times a day.     sildenafil  (VIAGRA ) 100 MG tablet Take 1 tablet (100 mg total) by mouth daily as needed. 10 tablet 11   Sod Picosulfate-Mag Ox-Cit Acd (CLENPIQ) 10-3.5-12 MG-GM -GM/160ML SOLN Take 1 kit by mouth once for 1 dose. 354 mL 0   amLODipine  (NORVASC ) 5 MG tablet TAKE 1 TABLET (5 MG TOTAL) BY MOUTH DAILY. 90 tablet 3   amLODipine -valsartan  (EXFORGE ) 5-320 MG tablet TAKE 1 TABLET BY MOUTH EVERY DAY 90 tablet 3   atorvastatin  (LIPITOR) 20 MG tablet TAKE 1 TABLET BY MOUTH EVERY DAY 90 tablet 3   doxazosin  (CARDURA ) 2 MG tablet TAKE 1 TABLET BY MOUTH EVERY DAY 90 tablet 3   metFORMIN  (GLUCOPHAGE -XR) 500 MG 24 hr tablet Take 4 tablets (2,000 mg total) by mouth daily. 360 tablet 3   pantoprazole   (PROTONIX ) 40 MG tablet TAKE 1 TAB BY MOUTH TWICE A DAY FOR TWO MONTHS AND THEN 40 MG BY MOUTH DAILY 180 tablet 1   Semaglutide  (RYBELSUS ) 14 MG TABS Take 1 tablet (14 mg total) by mouth daily. 90 tablet 3   valsartan  (DIOVAN ) 320 MG tablet TAKE 1 TABLET BY MOUTH EVERY DAY 90 tablet 3   zolpidem  (AMBIEN ) 10 MG tablet Take 0.5-1 tablets (5-10 mg total) by mouth at bedtime as needed for sleep. 30 tablet 1   No facility-administered medications prior to visit.    ROS: Review of Systems  Constitutional:  Negative for appetite change, fatigue and unexpected weight change.  HENT:  Negative for congestion, nosebleeds, sneezing, sore throat and trouble swallowing.   Eyes:  Negative for itching and visual disturbance.  Respiratory:  Negative for cough, chest tightness, shortness of breath and wheezing.   Cardiovascular:  Negative for chest pain, palpitations and leg swelling.  Gastrointestinal:  Negative for abdominal distention, blood in stool, diarrhea and nausea.  Genitourinary:  Negative for frequency and hematuria.  Musculoskeletal:  Negative for back pain, gait problem, joint swelling and neck pain.  Skin:  Negative for rash.  Neurological:  Positive for weakness. Negative for dizziness, tremors and speech difficulty.  Psychiatric/Behavioral:  Negative  for agitation, dysphoric mood and sleep disturbance. The patient is not nervous/anxious.     Objective:  BP 130/82   Pulse 65   Temp 97.7 F (36.5 C)   Ht 5\' 10"  (1.778 m)   Wt 209 lb 9.6 oz (95.1 kg)   SpO2 98%   BMI 30.07 kg/m   BP Readings from Last 3 Encounters:  08/21/23 130/82  05/12/23 136/74  02/23/23 (!) 143/87    Wt Readings from Last 3 Encounters:  08/21/23 209 lb 9.6 oz (95.1 kg)  06/10/23 214 lb (97.1 kg)  05/12/23 214 lb (97.1 kg)    Physical Exam Constitutional:      General: He is not in acute distress.    Appearance: He is well-developed.     Comments: NAD  Eyes:     Conjunctiva/sclera: Conjunctivae  normal.     Pupils: Pupils are equal, round, and reactive to light.  Neck:     Thyroid : No thyromegaly.     Vascular: No JVD.  Cardiovascular:     Rate and Rhythm: Normal rate and regular rhythm.     Heart sounds: Normal heart sounds. No murmur heard.    No friction rub. No gallop.  Pulmonary:     Effort: Pulmonary effort is normal. No respiratory distress.     Breath sounds: Normal breath sounds. No wheezing or rales.  Chest:     Chest wall: No tenderness.  Abdominal:     General: Bowel sounds are normal. There is no distension.     Palpations: Abdomen is soft. There is no mass.     Tenderness: There is no abdominal tenderness. There is no guarding or rebound.  Musculoskeletal:        General: No tenderness. Normal range of motion.     Cervical back: Normal range of motion.  Lymphadenopathy:     Cervical: No cervical adenopathy.  Skin:    General: Skin is warm and dry.     Findings: No rash.  Neurological:     Mental Status: He is alert and oriented to person, place, and time.     Cranial Nerves: No cranial nerve deficit.     Motor: No abnormal muscle tone.     Coordination: Coordination normal.     Gait: Gait normal.     Deep Tendon Reflexes: Reflexes are normal and symmetric.  Psychiatric:        Behavior: Behavior normal.        Thought Content: Thought content normal.        Judgment: Judgment normal.   R hemiparesis  Lab Results  Component Value Date   WBC 7.7 10/21/2022   HGB 15.1 10/21/2022   HCT 45.2 10/21/2022   PLT 304.0 10/21/2022   GLUCOSE 105 (H) 10/21/2022   CHOL 74 08/27/2021   TRIG 104.0 08/27/2021   HDL 32.90 (L) 08/27/2021   LDLDIRECT 30.1 11/14/2010   LDLCALC 21 08/27/2021   ALT 21 10/21/2022   AST 17 10/21/2022   NA 139 10/21/2022   K 4.0 10/21/2022   CL 107 10/21/2022   CREATININE 0.93 10/21/2022   BUN 12 10/21/2022   CO2 23 10/21/2022   TSH 2.62 10/21/2022   PSA 5.03 (H) 08/27/2021   INR 1.2 (H) 05/15/2016   HGBA1C 6.7 (A)  05/12/2023   MICROALBUR 6.1 (H) 07/22/2022    DG Chest 2 View Result Date: 07/17/2022 CLINICAL DATA:  Swallowed dental crown EXAM: CHEST - 2 VIEW COMPARISON:  None Available. FINDINGS: Heart and mediastinal  contours are within normal limits. No focal opacities or effusions. No acute bony abnormality. Dental crown is visualized on the lower aspect of the image in the midline of the upper abdomen. IMPRESSION: Dental crown in the midline of the upper abdomen, likely in the distal stomach. No acute cardiopulmonary disease. Electronically Signed   By: Janeece Mechanic M.D.   On: 07/17/2022 17:10   DG Neck Soft Tissue Result Date: 07/17/2022 CLINICAL DATA:  Swallowed foreign body, dental crown EXAM: NECK SOFT TISSUES - 1+ VIEW COMPARISON:  None Available. FINDINGS: Prevertebral soft tissues are unremarkable. No opaque foreign bodies are seen. Epiglottis is not thickened. There is no significant narrowing of airways. No focal abnormalities are seen in the visualized upper medial aspects of both lungs. IMPRESSION: No radiopaque foreign bodies are seen in the neck. Epiglottis is not thickened. There is no significant narrowing of the airways. Electronically Signed   By: Craven Do M.D.   On: 07/17/2022 17:07   DG Abd 2 Views Result Date: 07/17/2022 CLINICAL DATA:  Swallowed dental crown EXAM: ABDOMEN - 2 VIEW COMPARISON:  None Available. FINDINGS: Scattered colonic stool. Gas is seen in nondilated loops of small and large bowel. There is a metallic focus overlying the distal stomach consistent with the patient's history of swallowed radiopaque foreign body. The extreme caudal aspect of the pelvis is clipped off the edge of the film. IMPRESSION: Radiopaque foreign body overlying the distal stomach in the same which could be consistent with a dental crown. Nonspecific bowel gas pattern with scattered stool. Electronically Signed   By: Adrianna Horde M.D.   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 Exercising a lot - gym 5-6 d/wk       Dyslipidemia associated with type 2 diabetes mellitus (HCC) (Chronic)   Chronic. Cont on Lipitor      Relevant Medications   amLODipine -valsartan  (EXFORGE ) 5-320 MG tablet   atorvastatin  (LIPITOR) 20 MG tablet   metFORMIN  (GLUCOPHAGE -XR) 500 MG 24 hr tablet   Semaglutide  (RYBELSUS ) 14 MG TABS   Elevated PSA   Relevant Orders   PSA   Dysphagia   Relevant Medications   pantoprazole  (PROTONIX ) 40 MG tablet   Other Visit Diagnoses       Gastroesophageal reflux disease without esophagitis       Relevant Medications   pantoprazole  (PROTONIX ) 40 MG tablet     Erosive gastritis       Relevant Medications   pantoprazole  (PROTONIX ) 40 MG tablet         Meds ordered this encounter  Medications   amLODipine -valsartan  (EXFORGE ) 5-320 MG tablet    Sig: Take 1 tablet by mouth daily.    Dispense:  90 tablet    Refill:  3   atorvastatin  (LIPITOR) 20 MG tablet    Sig:  Take 1 tablet (20 mg total) by mouth daily.    Dispense:  90 tablet    Refill:  3   doxazosin  (CARDURA ) 2 MG tablet    Sig: Take 1 tablet (2 mg total) by mouth daily.    Dispense:  90 tablet    Refill:  3   metFORMIN  (GLUCOPHAGE -XR) 500 MG 24 hr tablet    Sig: Take 4 tablets (2,000 mg total) by mouth daily.    Dispense:  360 tablet    Refill:  3   pantoprazole  (PROTONIX ) 40 MG tablet    Sig: TAKE 1 TAB BY MOUTH  TWICE A DAY FOR TWO MONTHS AND THEN 40 MG BY MOUTH DAILY    Dispense:  180 tablet    Refill:  1   Semaglutide  (RYBELSUS ) 14 MG TABS    Sig: Take 1 tablet (14 mg total) by mouth daily.    Dispense:  90 tablet    Refill:  3   zolpidem  (AMBIEN ) 10 MG tablet    Sig: Take 0.5-1 tablets (5-10 mg total) by mouth at bedtime as needed for sleep.    Dispense:  30 tablet    Refill:  1      Follow-up: Return in about 3 months (around 11/21/2023) for a follow-up visit.  Anitra Barn, MD

## 2023-08-26 ENCOUNTER — Ambulatory Visit: Payer: Self-pay | Admitting: Internal Medicine

## 2023-08-26 DIAGNOSIS — R972 Elevated prostate specific antigen [PSA]: Secondary | ICD-10-CM

## 2023-11-17 ENCOUNTER — Ambulatory Visit: Payer: Medicare PPO | Admitting: Internal Medicine

## 2023-11-17 NOTE — Progress Notes (Deleted)
 Name: Christian Clements  Age/ Sex: 73 y.o., male   MRN/ DOB: [id number], 1950/11/26     PCP: Garald Karlynn GAILS, MD   Reason for Endocrinology Evaluation: Type 2 Diabetes Mellitus  Initial Endocrine Consultative Visit: 02/12/2016    PATIENT IDENTIFIER: Mr. Christian Clements is a 73 y.o. male with a past medical history of DM, CVA, BPH and HTN. The patient has followed with Endocrinology clinic since 02/12/2016 for consultative assistance with management of his diabetes.  DIABETIC HISTORY:  Mr. Arduini was diagnosed with DM 2006, he took insulin  for 1 month in 2017. His hemoglobin A1c has ranged from 5.9% in 2020, peaking at 7.9% in 2023.   SUBJECTIVE:   During the last visit (05/12/2023):A1c 6.7%     Today (11/17/2023): Mr. Christian Clements  is here for diabetes management.  He checks his blood sugars occasionally .The patient has not had hypoglycemic episodes since the last clinic visit.  Patient follows with neurology for right leg spasticity following stroke in 2017, patient on baclofen    Denies nausea or vomiting  Denies constipation or diarrhea     HOME DIABETES REGIMEN:  Metformin  500 mg,4 tabs daily  Rybelsus  14 mg daily      Statin: Yes ACE-I/ARB: No    METER DOWNLOAD SUMMARY: n/a    DIABETIC COMPLICATIONS: Microvascular complications:  Neuropathy Denies: CKD Last Eye Exam: Completed 01/05/2023  Macrovascular complications:  CVA Denies: CAD,  PVD   HISTORY:  Past Medical History:  Past Medical History:  Diagnosis Date   Diabetes mellitus    Hypertension    Prostatitis 2011   Stroke [facility])    Past Surgical History:  Past Surgical History:  Procedure Laterality Date   COLONOSCOPY     None     Social History:  reports that he has quit smoking. He has never used smokeless tobacco. He reports current alcohol use of about 3.0 standard drinks of alcohol per week. He reports that he does not use drugs. Family History:  Family History  Problem Relation  Age of Onset   Cancer Mother 88       leukemia   Hypertension Other    Diabetes Maternal Grandfather    Stroke Sister    Cancer Brother    HIV/AIDS Brother    Colon cancer Neg Hx    Esophageal cancer Neg Hx    Liver cancer Neg Hx    Rectal cancer Neg Hx    Stomach cancer Neg Hx    Colon polyps Neg Hx      HOME MEDICATIONS: Allergies as of 11/17/2023       Reactions   Accuretic  [quinapril -hydrochlorothiazide ]    Drug rash ?        Medication List        Accurate as of November 17, 2023  7:13 AM. If you have any questions, ask your nurse or doctor.          amLODipine -valsartan  5-320 MG tablet Commonly known as: EXFORGE  Take 1 tablet by mouth daily.   aspirin  325 MG tablet Take 1 tablet (325 mg total) by mouth daily.   atorvastatin  20 MG tablet Commonly known as: LIPITOR Take 1 tablet (20 mg total) by mouth daily.   b complex vitamins tablet Take 1 tablet by mouth daily.   baclofen  10 MG tablet Commonly known as: LIORESAL  TAKE 1 TABLET (10 MG TOTAL) BY MOUTH 3 (THREE) TIMES DAILY. OK TO TAKE EXTRA DOSE AS NEEDED   blood glucose meter kit and supplies Kit Dispense  based on patient and insurance preference. Use two times daily as directed. (FOR ICD-9 250.00, 250.01).   Cholecalciferol 25 MCG (1000 UT) tablet Take 1,000 Units by mouth daily.   Clenpiq 10-3.5-12 MG-GM -GM/160ML Soln Generic drug: Sod Picosulfate-Mag Ox-Cit Acd Take 1 kit by mouth once for 1 dose.   docusate sodium  100 MG capsule Commonly known as: Colace Take 1 capsule (100 mg total) by mouth 2 (two) times daily.   donepezil  5 MG tablet Commonly known as: ARICEPT  Take 1 tablet (5 mg total) by mouth at bedtime.   doxazosin  2 MG tablet Commonly known as: CARDURA  Take 1 tablet (2 mg total) by mouth daily.   glucose blood test strip Commonly known as: OneTouch Verio Use to check blood sugars twice a day Dx E11.9 Yearly physical w/labs are due must see MD for refills   METAMUCIL  PO Take by mouth 2 (two) times a day.   metFORMIN  500 MG 24 hr tablet Commonly known as: GLUCOPHAGE -XR Take 4 tablets (2,000 mg total) by mouth daily.   pantoprazole  40 MG tablet Commonly known as: PROTONIX  TAKE 1 TAB BY MOUTH TWICE A DAY FOR TWO MONTHS AND THEN 40 MG BY MOUTH DAILY   Rybelsus  14 MG Tabs Generic drug: Semaglutide  Take 1 tablet (14 mg total) by mouth daily.   sildenafil  100 MG tablet Commonly known as: VIAGRA  Take 1 tablet (100 mg total) by mouth daily as needed.   zolpidem  10 MG tablet Commonly known as: AMBIEN  Take 0.5-1 tablets (5-10 mg total) by mouth at bedtime as needed for sleep.         OBJECTIVE:   Vital Signs: There were no vitals taken for this visit.  Wt Readings from Last 3 Encounters:  08/21/23 209 lb 9.6 oz (95.1 kg)  06/10/23 214 lb (97.1 kg)  05/12/23 214 lb (97.1 kg)     Exam: General: Pt appears well and is in NAD  Lungs: Clear with good BS bilat   Heart: RRR   Extremities: No pretibial edema.   Neuro: MS is good with appropriate affect, pt is alert and Ox3    DM foot exam:05/12/2023  The skin of the feet is intact without sores or ulcerations, discolored toenails noted The pedal pulses are 2+ on right and 2+ on left. The sensation is absent to a screening 5.07, 10 gram monofilament bilaterally     DATA REVIEWED:  Lab Results  Component Value Date   HGBA1C 7.0 (H) 08/21/2023   HGBA1C 6.7 (A) 05/12/2023   HGBA1C 6.7 (H) 10/21/2022    Latest Reference Range & Units 08/21/23 10:59  Comprehensive metabolic panel with GFR  Rpt !  Sodium 135 - 145 mEq/L 138  Potassium 3.5 - 5.1 mEq/L 4.2  Chloride 96 - 112 mEq/L 103  CO2 19 - 32 mEq/L 25  Glucose 70 - 99 mg/dL 892 (H)  BUN 6 - 23 mg/dL 16  Creatinine 9.59 - 8.49 mg/dL 9.02  Calcium  8.4 - 10.5 mg/dL 89.9  Alkaline Phosphatase 39 - 117 U/L 66  Albumin 3.5 - 5.2 g/dL 5.0  AST 0 - 37 U/L 16  ALT 0 - 53 U/L 23  Total Protein 6.0 - 8.3 g/dL 7.5  Total Bilirubin 0.2 -  1.2 mg/dL 1.0  GFR >39.99 mL/min 77.49    Old records , labs and images have been reviewed.    ASSESSMENT / PLAN / RECOMMENDATIONS:   1) Type 2 Diabetes Mellitus, Optimally controlled, With neuropathic and macrovascular complications - Most  recent A1c of 6.7 %. Goal A1c < 7.0 %.    - A1c at goal -Tolerating medications without side effects -No changes  -Due to neuropathy, patient was advised to inspect feet multiple times a day   MEDICATIONS: Continue Metformin  500 mg 4 tabs daily  Continue Rybelsus  14 mg daily   EDUCATION / INSTRUCTIONS: BG monitoring instructions: Patient is instructed to check his blood sugars 2-3 times a week. Call [facility] if: BG persistently < 70  I reviewed the Rule of 15 for the treatment of hypoglycemia in detail with the patient. Literature supplied.    2) Diabetic complications:  Eye: Does not have known diabetic retinopathy.  Neuro/ Feet: Does  have known diabetic peripheral neuropathy .  Renal: Patient does not have known baseline CKD. He   is  on an ACEI/ARB at present.     F/U in 6 months     Signed electronically by: Stefano Redgie Butts, MD  [facility] Endocrinology  [facility] [address] [place], [place] [address] Phone: [phone] FAX: [phone]   CC: Garald Karlynn GAILS, MD [address] [place] [address] Phone: [phone]  Fax: [phone]  Return to Endocrinology clinic as below: Future Appointments  Date Time Provider Department Center  11/17/2023 10:10 AM Yumna Ebers, Donell Redgie, MD [facility] None  11/24/2023 10:20 AM Plotnikov, Karlynn GAILS, MD [facility] None  02/23/2024 10:30 AM Tobie Breslow K, DO [facility] None  06/10/2024  3:50 PM [facility]-ANNUAL WELLNESS VISIT 2 [facility] None

## 2023-11-24 ENCOUNTER — Encounter: Payer: Self-pay | Admitting: Internal Medicine

## 2023-11-24 ENCOUNTER — Ambulatory Visit: Admitting: Internal Medicine

## 2023-11-24 VITALS — BP 132/70 | HR 70 | Temp 97.8°F | Ht 70.0 in | Wt 208.0 lb

## 2023-11-24 DIAGNOSIS — Z7984 Long term (current) use of oral hypoglycemic drugs: Secondary | ICD-10-CM | POA: Diagnosis not present

## 2023-11-24 DIAGNOSIS — E785 Hyperlipidemia, unspecified: Secondary | ICD-10-CM | POA: Diagnosis not present

## 2023-11-24 DIAGNOSIS — I69359 Hemiplegia and hemiparesis following cerebral infarction affecting unspecified side: Secondary | ICD-10-CM | POA: Diagnosis not present

## 2023-11-24 DIAGNOSIS — E1169 Type 2 diabetes mellitus with other specified complication: Secondary | ICD-10-CM

## 2023-11-24 DIAGNOSIS — N401 Enlarged prostate with lower urinary tract symptoms: Secondary | ICD-10-CM

## 2023-11-24 DIAGNOSIS — R350 Frequency of micturition: Secondary | ICD-10-CM

## 2023-11-24 DIAGNOSIS — R972 Elevated prostate specific antigen [PSA]: Secondary | ICD-10-CM | POA: Diagnosis not present

## 2023-11-24 DIAGNOSIS — Z23 Encounter for immunization: Secondary | ICD-10-CM | POA: Diagnosis not present

## 2023-11-24 DIAGNOSIS — E1142 Type 2 diabetes mellitus with diabetic polyneuropathy: Secondary | ICD-10-CM

## 2023-11-24 LAB — COMPREHENSIVE METABOLIC PANEL WITH GFR
ALT: 21 U/L (ref 0–53)
AST: 18 U/L (ref 0–37)
Albumin: 4.9 g/dL (ref 3.5–5.2)
Alkaline Phosphatase: 68 U/L (ref 39–117)
BUN: 14 mg/dL (ref 6–23)
CO2: 23 meq/L (ref 19–32)
Calcium: 9.7 mg/dL (ref 8.4–10.5)
Chloride: 104 meq/L (ref 96–112)
Creatinine, Ser: 0.92 mg/dL (ref 0.40–1.50)
GFR: 82.41 mL/min (ref >60.00)
Glucose, Bld: 106 mg/dL — ABNORMAL HIGH (ref 70–99)
Potassium: 4.1 meq/L (ref 3.5–5.1)
Sodium: 140 meq/L (ref 135–145)
Total Bilirubin: 1.1 mg/dL (ref 0.2–1.2)
Total Protein: 7.6 g/dL (ref 6.0–8.3)

## 2023-11-24 LAB — PSA: PSA: 6.7 ng/mL — ABNORMAL HIGH (ref 0.10–4.00)

## 2023-11-24 LAB — HEMOGLOBIN A1C: Hgb A1c MFr Bld: 7.1 % — ABNORMAL HIGH (ref 4.6–6.5)

## 2023-11-24 NOTE — Assessment & Plan Note (Signed)
 No change Cont on ASA, Lipitor, Exforge 

## 2023-11-24 NOTE — Assessment & Plan Note (Addendum)
 Repeat free PSA Pt cancelled Urology appt See Dr. Nieves for elevated PSA

## 2023-11-24 NOTE — Assessment & Plan Note (Signed)
Chronic. Cont on Lipitor 

## 2023-11-24 NOTE — Assessment & Plan Note (Signed)
 See Dr. Nieves for elevated PSA

## 2023-11-24 NOTE — Patient Instructions (Addendum)
 See Dr. Nieves for elevated PSA [phone] [address] [facility] [place], [place]  [address] Location Hours Mon-Fri : 8:00 a. m. - 5:00 p. m.

## 2023-11-24 NOTE — Progress Notes (Signed)
 Subjective:  Patient ID: Christian Clements, male    DOB: 1950/03/31  Age: 73 y.o. MRN: [id number]  CC: Follow-up (60mo; no concerns)   HPI Joas Motton presents for HTN, DM, remote CVA f/u, elevated PSA  Outpatient Medications Prior to Visit  Medication Sig Dispense Refill   amLODipine -valsartan  (EXFORGE ) 5-320 MG tablet Take 1 tablet by mouth daily. 90 tablet 3   aspirin  325 MG tablet Take 1 tablet (325 mg total) by mouth daily. 100 tablet 3   atorvastatin  (LIPITOR) 20 MG tablet Take 1 tablet (20 mg total) by mouth daily. 90 tablet 3   b complex vitamins tablet Take 1 tablet by mouth daily. 100 tablet 3   baclofen  (LIORESAL ) 10 MG tablet TAKE 1 TABLET (10 MG TOTAL) BY MOUTH 3 (THREE) TIMES DAILY. OK TO TAKE EXTRA DOSE AS NEEDED 300 tablet 3   blood glucose meter kit and supplies KIT Dispense based on patient and insurance preference. Use two times daily as directed. (FOR ICD-9 250.00, 250.01). 1 each 0   Cholecalciferol 1000 UNITS tablet Take 1,000 Units by mouth daily.     docusate sodium  (COLACE) 100 MG capsule Take 1 capsule (100 mg total) by mouth 2 (two) times daily. 60 capsule 11   donepezil  (ARICEPT ) 5 MG tablet Take 1 tablet (5 mg total) by mouth at bedtime. 90 tablet 1   doxazosin  (CARDURA ) 2 MG tablet Take 1 tablet (2 mg total) by mouth daily. 90 tablet 3   glucose blood (ONETOUCH VERIO) test strip Use to check blood sugars twice a day Dx E11.9 Yearly physical w/labs are due must see MD for refills 100 each 0   metFORMIN  (GLUCOPHAGE -XR) 500 MG 24 hr tablet Take 4 tablets (2,000 mg total) by mouth daily. 360 tablet 3   pantoprazole  (PROTONIX ) 40 MG tablet TAKE 1 TAB BY MOUTH TWICE A DAY FOR TWO MONTHS AND THEN 40 MG BY MOUTH DAILY 180 tablet 1   Psyllium (METAMUCIL PO) Take by mouth 2 (two) times a day.     Semaglutide  (RYBELSUS ) 14 MG TABS Take 1 tablet (14 mg total) by mouth daily. 90 tablet 3   sildenafil  (VIAGRA ) 100 MG tablet Take 1 tablet (100 mg total) by mouth daily as  needed. 10 tablet 11   Sod Picosulfate-Mag Ox-Cit Acd (CLENPIQ) 10-3.5-12 MG-GM -GM/160ML SOLN Take 1 kit by mouth once for 1 dose. 354 mL 0   zolpidem  (AMBIEN ) 10 MG tablet Take 0.5-1 tablets (5-10 mg total) by mouth at bedtime as needed for sleep. 30 tablet 1   No facility-administered medications prior to visit.    ROS: Review of Systems  Constitutional:  Negative for appetite change, fatigue and unexpected weight change.  HENT:  Negative for congestion, nosebleeds, sneezing, sore throat and trouble swallowing.   Eyes:  Negative for itching and visual disturbance.  Respiratory:  Negative for cough.   Cardiovascular:  Negative for chest pain, palpitations and leg swelling.  Gastrointestinal:  Negative for abdominal distention, blood in stool, diarrhea and nausea.  Genitourinary:  Positive for frequency. Negative for hematuria.  Musculoskeletal:  Positive for gait problem. Negative for back pain, joint swelling and neck pain.  Skin:  Negative for rash.  Neurological:  Positive for weakness. Negative for dizziness, tremors and speech difficulty.  Psychiatric/Behavioral:  Negative for agitation, dysphoric mood and sleep disturbance. The patient is not nervous/anxious.     Objective:  BP 132/70   Pulse 70   Temp 97.8 F (36.6 C)   Ht 5' 10 (  1.778 m)   Wt 208 lb (94.3 kg)   SpO2 96%   BMI 29.84 kg/m   BP Readings from Last 3 Encounters:  11/24/23 132/70  08/21/23 130/82  05/12/23 136/74    Wt Readings from Last 3 Encounters:  11/24/23 208 lb (94.3 kg)  08/21/23 209 lb 9.6 oz (95.1 kg)  06/10/23 214 lb (97.1 kg)    Physical Exam Constitutional:      General: He is not in acute distress.    Appearance: Normal appearance. He is well-developed.     Comments: NAD  Eyes:     Conjunctiva/sclera: Conjunctivae normal.     Pupils: Pupils are equal, round, and reactive to light.  Neck:     Thyroid : No thyromegaly.     Vascular: No JVD.  Cardiovascular:     Rate and Rhythm:  Normal rate and regular rhythm.     Heart sounds: Normal heart sounds. No murmur heard.    No friction rub. No gallop.  Pulmonary:     Effort: Pulmonary effort is normal. No respiratory distress.     Breath sounds: Normal breath sounds. No wheezing or rales.  Chest:     Chest wall: No tenderness.  Abdominal:     General: Bowel sounds are normal. There is no distension.     Palpations: Abdomen is soft. There is no mass.     Tenderness: There is no abdominal tenderness. There is no guarding or rebound.  Musculoskeletal:        General: No tenderness. Normal range of motion.     Cervical back: Normal range of motion.  Lymphadenopathy:     Cervical: No cervical adenopathy.  Skin:    General: Skin is warm and dry.     Findings: No rash.  Neurological:     Mental Status: He is alert and oriented to person, place, and time.     Cranial Nerves: No cranial nerve deficit.     Motor: No abnormal muscle tone.     Coordination: Coordination normal.     Gait: Gait normal.     Deep Tendon Reflexes: Reflexes are normal and symmetric.  Psychiatric:        Behavior: Behavior normal.        Thought Content: Thought content normal.        Judgment: Judgment normal.     Lab Results  Component Value Date   WBC 7.7 10/21/2022   HGB 15.1 10/21/2022   HCT 45.2 10/21/2022   PLT 304.0 10/21/2022   GLUCOSE 107 (H) 08/21/2023   CHOL 74 08/27/2021   TRIG 104.0 08/27/2021   HDL 32.90 (L) 08/27/2021   LDLDIRECT 30.1 11/14/2010   LDLCALC 21 08/27/2021   ALT 23 08/21/2023   AST 16 08/21/2023   NA 138 08/21/2023   K 4.2 08/21/2023   CL 103 08/21/2023   CREATININE 0.97 08/21/2023   BUN 16 08/21/2023   CO2 25 08/21/2023   TSH 2.62 10/21/2022   PSA 6.89 (H) 08/21/2023   INR 1.2 (H) 05/15/2016   HGBA1C 7.0 (H) 08/21/2023    DG Chest 2 View Result Date: 07/17/2022 CLINICAL DATA:  Swallowed dental crown EXAM: CHEST - 2 VIEW COMPARISON:  None Available. FINDINGS: Heart and mediastinal contours are  within normal limits. No focal opacities or effusions. No acute bony abnormality. Dental crown is visualized on the lower aspect of the image in the midline of the upper abdomen. IMPRESSION: Dental crown in the midline of the upper abdomen, likely in  the distal stomach. No acute cardiopulmonary disease. Electronically Signed   By: Franky Crease M.D.   On: 07/17/2022 17:10   DG Neck Soft Tissue Result Date: 07/17/2022 CLINICAL DATA:  Swallowed foreign body, dental crown EXAM: NECK SOFT TISSUES - 1+ VIEW COMPARISON:  None Available. FINDINGS: Prevertebral soft tissues are unremarkable. No opaque foreign bodies are seen. Epiglottis is not thickened. There is no significant narrowing of airways. No focal abnormalities are seen in the visualized upper medial aspects of both lungs. IMPRESSION: No radiopaque foreign bodies are seen in the neck. Epiglottis is not thickened. There is no significant narrowing of the airways. Electronically Signed   By: Gearldine Mary M.D.   On: 07/17/2022 17:07   DG Abd 2 Views Result Date: 07/17/2022 CLINICAL DATA:  Swallowed dental crown EXAM: ABDOMEN - 2 VIEW COMPARISON:  None Available. FINDINGS: Scattered colonic stool. Gas is seen in nondilated loops of small and large bowel. There is a metallic focus overlying the distal stomach consistent with the patient's history of swallowed radiopaque foreign body. The extreme caudal aspect of the pelvis is clipped off the edge of the film. IMPRESSION: Radiopaque foreign body overlying the distal stomach in the same which could be consistent with a dental crown. Nonspecific bowel gas pattern with scattered stool. Electronically Signed   By: Ranell Bring M.D.   On: 07/17/2022 17:05    Assessment & Plan:   Problem List Items Addressed This Visit     BPH (benign prostatic hyperplasia)   See Dr. Nieves for elevated PSA      CVA, old, hemiparesis (HCC) - Primary   No change Cont on ASA, Lipitor, Exforge        Relevant Orders    Comprehensive metabolic panel with GFR   Hemoglobin A1c   PSA   Dyslipidemia associated with type 2 diabetes mellitus (HCC) (Chronic)   Chronic. Cont on Lipitor      Elevated PSA   Repeat free PSA Pt cancelled Urology appt See Dr. Nieves for elevated PSA      Relevant Orders   PSA   Type 2 diabetes mellitus with diabetic polyneuropathy, without long-term current use of insulin  (HCC)   Relevant Orders   Comprehensive metabolic panel with GFR   Hemoglobin A1c   Other Visit Diagnoses       Need for vaccination against Streptococcus pneumoniae       Relevant Orders   Pneumococcal conjugate vaccine 20-valent (PCV20)         No orders of the defined types were placed in this encounter.     Follow-up: No follow-ups on file.  Marolyn Noel, MD

## 2023-11-27 ENCOUNTER — Ambulatory Visit: Payer: Self-pay | Admitting: Internal Medicine

## 2024-02-11 ENCOUNTER — Other Ambulatory Visit: Payer: Self-pay | Admitting: Internal Medicine

## 2024-02-11 DIAGNOSIS — R1310 Dysphagia, unspecified: Secondary | ICD-10-CM

## 2024-02-11 DIAGNOSIS — K2960 Other gastritis without bleeding: Secondary | ICD-10-CM

## 2024-02-11 DIAGNOSIS — K219 Gastro-esophageal reflux disease without esophagitis: Secondary | ICD-10-CM

## 2024-02-22 NOTE — Progress Notes (Unsigned)
 Follow-up Visit   Date: 02/22/24    Christian Clements MRN: [id number] DOB: Mar 29, 1950   Interim History: Christian Clements is a 73 y.o. 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  The patient was accompanied to the clinic by self.  IMPRESSION/PLAN: Right leg spasticity following stroke in 2017, stable.   - Continue baclofen  10mg  TID  - Continue daily stretching***  2.  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 Cardioembolic source was suspected given the involvement of 2 vascular territories, however cardiac monitoring was normal.   If he has any new events, he will need implantable loop recorder  - Continue aspirin  325 mg, Lipitor 20 mg, and BP medication as per primary  Return to clinic in 1 year   -------------------------------------- UPDATE 02/23/2023:  He is here for 1 year follow-up visit. Over the past month, he has been taking baclofen  10mg  twice daily and noticed that he was doing better when taking three times daily.  He continues to be active and goes to the gym 5 times daily.    UPDATE 02/22/2024:  ***  Medications:  Current Outpatient Medications on File Prior to Visit  Medication Sig Dispense Refill   amLODipine -valsartan  (EXFORGE ) 5-320 MG tablet Take 1 tablet by mouth daily. 90 tablet 3   aspirin  325 MG tablet Take 1 tablet (325 mg total) by mouth daily. 100 tablet 3   atorvastatin  (LIPITOR) 20 MG tablet Take 1 tablet (20 mg total) by mouth daily. 90 tablet 3   b complex vitamins tablet Take 1 tablet by mouth daily. 100 tablet 3   baclofen  (LIORESAL ) 10 MG tablet TAKE 1 TABLET (10 MG TOTAL) BY MOUTH 3 (THREE) TIMES DAILY. OK TO TAKE EXTRA DOSE AS NEEDED 300 tablet 3   blood glucose  meter kit and supplies KIT Dispense based on patient and insurance preference. Use two times daily as directed. (FOR ICD-9 250.00, 250.01). 1 each 0   Cholecalciferol 1000 UNITS tablet Take 1,000 Units by mouth daily.     docusate sodium  (COLACE) 100 MG capsule Take 1 capsule (100 mg total) by mouth 2 (two) times daily. 60 capsule 11   donepezil  (ARICEPT ) 5 MG tablet Take 1 tablet (5 mg total) by mouth at bedtime. 90 tablet 1   doxazosin  (CARDURA ) 2 MG tablet Take 1 tablet (2 mg total) by mouth daily. 90 tablet 3   glucose blood (ONETOUCH VERIO) test strip Use to check blood sugars twice a day Dx E11.9 Yearly physical w/labs are due must see MD for refills 100 each 0   metFORMIN  (GLUCOPHAGE -XR) 500 MG 24 hr tablet Take 4 tablets (2,000 mg total) by mouth daily. 360 tablet 3   pantoprazole  (PROTONIX ) 40 MG tablet TAKE 1 TAB BY MOUTH TWICE A DAY FOR TWO MONTHS AND THEN 40 MG BY MOUTH DAILY 180 tablet 1   Psyllium (METAMUCIL PO) Take by mouth 2 (two) times a day.     Semaglutide  (RYBELSUS ) 14 MG TABS Take 1 tablet (14 mg total) by mouth daily. 90 tablet 3   sildenafil  (VIAGRA ) 100 MG tablet Take 1 tablet (100 mg total) by mouth daily as needed. 10 tablet 11   Sod Picosulfate-Mag Ox-Cit Acd (CLENPIQ) 10-3.5-12 MG-GM -GM/160ML SOLN Take 1 kit by mouth once for 1 dose. 354 mL 0  zolpidem  (AMBIEN ) 10 MG tablet Take 0.5-1 tablets (5-10 mg total) by mouth at bedtime as needed for sleep. 30 tablet 1   No current facility-administered medications on file prior to visit.    Allergies:  Allergies  Allergen Reactions   Accuretic  [Quinapril -Hydrochlorothiazide ]     Drug rash ?    Vital Signs:  There were no vitals taken for this visit.   Neurological Exam: MENTAL STATUS including orientation to time, place, person, recent and remote memory, attention span and concentration, language, and fund of knowledge is normal.  Speech is not dysarthric.  MOTOR:  Motor strength is 5/5 in all extremities,  except right intrinsic hand muscles are 4+/5.  No atrophy, fasciculations or abnormal movements.  No pronator drift.  Tone is 0+ in the RLE.    COORDINATION/GAIT:  Finger and toe tapping is slowed on the right and there is mild dysmetria on the right.   Gait shows mild spasticity in the right leg, stable and unassisted  Data: MRI/A brain 01/04/2016: Acute sub cm infarction at the ventral pontomedullary junction just to the left of midline. Small acute left frontal subcortical white matter infarction. These 2 acute infarctions could be coincidental in due to ordinary small vessel disease, but emboli from the heart or ascending aorta could also result in this. Moderate chronic small-vessel ischemic changes of the cerebral hemispheric white matter for a age. Negative intracranial MR angiography of the large and medium size vessels.   Cardiac monitor 04/06/2016:  No arrythmia   Echo 01/06/2016:  Left ventricle: The cavity size was normal. Wall thickness was   normal. Systolic function was normal. The estimated ejection fraction was in the range of 55% to 60%.  No defect or patent foramen ovale was identified.   US  carotids 01/05/2016:  1-39% bilateral ICA   Lab Results  Component Value Date   HGBA1C 7.1 (H) 11/24/2023   Lab Results  Component Value Date   LDLCALC 21 08/27/2021      Thank you for allowing me to participate in patient's care.  If I can answer any additional questions, I would be pleased to do so.    Sincerely,    Keiera Strathman K. Tobie, DO

## 2024-02-23 ENCOUNTER — Encounter: Payer: Self-pay | Admitting: Neurology

## 2024-02-23 ENCOUNTER — Ambulatory Visit: Payer: Medicare PPO | Admitting: Neurology

## 2024-02-23 VITALS — BP 130/83 | HR 89 | Ht 70.0 in | Wt 207.0 lb

## 2024-02-23 DIAGNOSIS — G8111 Spastic hemiplegia affecting right dominant side: Secondary | ICD-10-CM

## 2024-02-23 MED ORDER — BACLOFEN 10 MG PO TABS: 10.0000 mg | ORAL_TABLET | Freq: Three times a day (TID) | ORAL | 3 refills | Status: AC

## 2024-02-24 ENCOUNTER — Encounter: Payer: Self-pay | Admitting: Internal Medicine

## 2024-02-24 ENCOUNTER — Ambulatory Visit: Admitting: Internal Medicine

## 2024-02-24 VITALS — BP 138/78 | HR 72 | Temp 98.2°F | Ht 70.0 in | Wt 209.0 lb

## 2024-02-24 DIAGNOSIS — N401 Enlarged prostate with lower urinary tract symptoms: Secondary | ICD-10-CM

## 2024-02-24 DIAGNOSIS — E1129 Type 2 diabetes mellitus with other diabetic kidney complication: Secondary | ICD-10-CM

## 2024-02-24 DIAGNOSIS — E1159 Type 2 diabetes mellitus with other circulatory complications: Secondary | ICD-10-CM

## 2024-02-24 DIAGNOSIS — E1142 Type 2 diabetes mellitus with diabetic polyneuropathy: Secondary | ICD-10-CM

## 2024-02-24 DIAGNOSIS — I69359 Hemiplegia and hemiparesis following cerebral infarction affecting unspecified side: Secondary | ICD-10-CM

## 2024-02-24 LAB — COMPREHENSIVE METABOLIC PANEL WITH GFR
ALT: 21 U/L (ref 0–53)
AST: 17 U/L (ref 0–37)
Albumin: 4.8 g/dL (ref 3.5–5.2)
Alkaline Phosphatase: 71 U/L (ref 39–117)
BUN: 15 mg/dL (ref 6–23)
CO2: 21 meq/L (ref 19–32)
Calcium: 9.8 mg/dL (ref 8.4–10.5)
Chloride: 104 meq/L (ref 96–112)
Creatinine, Ser: 1.01 mg/dL (ref 0.40–1.50)
GFR: 73.55 mL/min (ref >60.00)
Glucose, Bld: 121 mg/dL — ABNORMAL HIGH (ref 70–99)
Potassium: 4 meq/L (ref 3.5–5.1)
Sodium: 139 meq/L (ref 135–145)
Total Bilirubin: 0.9 mg/dL (ref 0.2–1.2)
Total Protein: 7.4 g/dL (ref 6.0–8.3)

## 2024-02-24 LAB — CBC WITH DIFFERENTIAL/PLATELET
Basophils Absolute: 0 K/uL (ref 0.0–0.1)
Basophils Relative: 0.5 % (ref 0.0–3.0)
Eosinophils Absolute: 0.4 K/uL (ref 0.0–0.7)
Eosinophils Relative: 5.4 % — ABNORMAL HIGH (ref 0.0–5.0)
HCT: 42.4 % (ref 39.0–52.0)
Hemoglobin: 14.7 g/dL (ref 13.0–17.0)
Lymphocytes Relative: 28.3 % (ref 12.0–46.0)
Lymphs Abs: 2.3 K/uL (ref 0.7–4.0)
MCHC: 34.8 g/dL (ref 30.0–36.0)
MCV: 91.5 fl (ref 78.0–100.0)
Monocytes Absolute: 0.6 K/uL (ref 0.1–1.0)
Monocytes Relative: 7.1 % (ref 3.0–12.0)
Neutro Abs: 4.7 K/uL (ref 1.4–7.7)
Neutrophils Relative %: 58.7 % (ref 43.0–77.0)
Platelets: 331 K/uL (ref 150.0–400.0)
RBC: 4.63 Mil/uL (ref 4.22–5.81)
RDW: 13.4 % (ref 11.5–15.5)
WBC: 8 K/uL (ref 4.0–10.5)

## 2024-02-24 LAB — LIPID PANEL
Cholesterol: 71 mg/dL (ref 0–200)
HDL: 33.1 mg/dL — ABNORMAL LOW (ref >39.00)
LDL Cholesterol: 18 mg/dL (ref 0–99)
NonHDL: 37.45
Total CHOL/HDL Ratio: 2
Triglycerides: 98 mg/dL (ref 0.0–149.0)
VLDL: 19.6 mg/dL (ref 0.0–40.0)

## 2024-02-24 LAB — MICROALBUMIN / CREATININE URINE RATIO
Creatinine,U: 174.3 mg/dL
Microalb Creat Ratio: 25.2 mg/g (ref 0.0–30.0)
Microalb, Ur: 4.4 mg/dL — ABNORMAL HIGH (ref 0.0–1.9)

## 2024-02-24 LAB — TSH: TSH: 2.54 u[IU]/mL (ref 0.35–5.50)

## 2024-02-24 LAB — PSA: PSA: 6.98 ng/mL — ABNORMAL HIGH (ref 0.10–4.00)

## 2024-02-24 LAB — HEMOGLOBIN A1C: Hgb A1c MFr Bld: 7 % — ABNORMAL HIGH (ref 4.6–6.5)

## 2024-02-24 NOTE — Assessment & Plan Note (Signed)
 On Baclofen  10 mg tid

## 2024-02-24 NOTE — Progress Notes (Unsigned)
 Subjective:  Patient ID: Christian Clements, male    DOB: 05-17-1950  Age: 73 y.o. MRN: [id number]  CC: Medical Management of Chronic Issues (54mo follow up; no concerns)   HPI Christian Clements presents for DM, HTN, h/o CVA On Baclofen  10 mg tid  Outpatient Medications Prior to Visit  Medication Sig Dispense Refill  . amLODipine -valsartan  (EXFORGE ) 5-320 MG tablet Take 1 tablet by mouth daily. 90 tablet 3  . aspirin  325 MG tablet Take 1 tablet (325 mg total) by mouth daily. 100 tablet 3  . atorvastatin  (LIPITOR) 20 MG tablet Take 1 tablet (20 mg total) by mouth daily. 90 tablet 3  . b complex vitamins tablet Take 1 tablet by mouth daily. 100 tablet 3  . baclofen  (LIORESAL ) 10 MG tablet Take 1 tablet (10 mg total) by mouth 3 (three) times daily. OK to take extra dose as needed 300 tablet 3  . blood glucose meter kit and supplies KIT Dispense based on patient and insurance preference. Use two times daily as directed. (FOR ICD-9 250.00, 250.01). 1 each 0  . Cholecalciferol 1000 UNITS tablet Take 1,000 Units by mouth daily.    . docusate sodium  (COLACE) 100 MG capsule Take 1 capsule (100 mg total) by mouth 2 (two) times daily. 60 capsule 11  . donepezil  (ARICEPT ) 5 MG tablet Take 1 tablet (5 mg total) by mouth at bedtime. 90 tablet 1  . doxazosin  (CARDURA ) 2 MG tablet Take 1 tablet (2 mg total) by mouth daily. 90 tablet 3  . glucose blood (ONETOUCH VERIO) test strip Use to check blood sugars twice a day Dx E11.9 Yearly physical w/labs are due must see MD for refills 100 each 0  . metFORMIN  (GLUCOPHAGE -XR) 500 MG 24 hr tablet Take 4 tablets (2,000 mg total) by mouth daily. 360 tablet 3  . pantoprazole  (PROTONIX ) 40 MG tablet TAKE 1 TAB BY MOUTH TWICE A DAY FOR TWO MONTHS AND THEN 40 MG BY MOUTH DAILY 180 tablet 1  . Psyllium (METAMUCIL PO) Take by mouth 2 (two) times a day.    . Semaglutide  (RYBELSUS ) 14 MG TABS Take 1 tablet (14 mg total) by mouth daily. 90 tablet 3  . sildenafil  (VIAGRA ) 100 MG  tablet Take 1 tablet (100 mg total) by mouth daily as needed. 10 tablet 11  . Sod Picosulfate-Mag Ox-Cit Acd (CLENPIQ) 10-3.5-12 MG-GM -GM/160ML SOLN Take 1 kit by mouth once for 1 dose. 354 mL 0  . zolpidem  (AMBIEN ) 10 MG tablet Take 0.5-1 tablets (5-10 mg total) by mouth at bedtime as needed for sleep. 30 tablet 1   No facility-administered medications prior to visit.    ROS: Review of Systems  Objective:  BP 138/78   Pulse 72   Temp 98.2 F (36.8 C)   Ht 5' 10 (1.778 m)   Wt 209 lb (94.8 kg)   SpO2 97%   BMI 29.99 kg/m   BP Readings from Last 3 Encounters:  02/24/24 138/78  02/23/24 130/83  11/24/23 132/70    Wt Readings from Last 3 Encounters:  02/24/24 209 lb (94.8 kg)  02/23/24 207 lb (93.9 kg)  11/24/23 208 lb (94.3 kg)    Physical Exam  Lab Results  Component Value Date   WBC 7.7 10/21/2022   HGB 15.1 10/21/2022   HCT 45.2 10/21/2022   PLT 304.0 10/21/2022   GLUCOSE 106 (H) 11/24/2023   CHOL 74 08/27/2021   TRIG 104.0 08/27/2021   HDL 32.90 (L) 08/27/2021   LDLDIRECT 30.1 11/14/2010  LDLCALC 21 08/27/2021   ALT 21 11/24/2023   AST 18 11/24/2023   NA 140 11/24/2023   K 4.1 11/24/2023   CL 104 11/24/2023   CREATININE 0.92 11/24/2023   BUN 14 11/24/2023   CO2 23 11/24/2023   TSH 2.62 10/21/2022   PSA 6.70 (H) 11/24/2023   INR 1.2 (H) 05/15/2016   HGBA1C 7.1 (H) 11/24/2023    DG Chest 2 View Result Date: 07/17/2022 CLINICAL DATA:  Swallowed dental crown EXAM: CHEST - 2 VIEW COMPARISON:  None Available. FINDINGS: Heart and mediastinal contours are within normal limits. No focal opacities or effusions. No acute bony abnormality. Dental crown is visualized on the lower aspect of the image in the midline of the upper abdomen. IMPRESSION: Dental crown in the midline of the upper abdomen, likely in the distal stomach. No acute cardiopulmonary disease. Electronically Signed   By: Franky Crease M.D.   On: 07/17/2022 17:10   DG Neck Soft Tissue Result  Date: 07/17/2022 CLINICAL DATA:  Swallowed foreign body, dental crown EXAM: NECK SOFT TISSUES - 1+ VIEW COMPARISON:  None Available. FINDINGS: Prevertebral soft tissues are unremarkable. No opaque foreign bodies are seen. Epiglottis is not thickened. There is no significant narrowing of airways. No focal abnormalities are seen in the visualized upper medial aspects of both lungs. IMPRESSION: No radiopaque foreign bodies are seen in the neck. Epiglottis is not thickened. There is no significant narrowing of the airways. Electronically Signed   By: Gearldine Mary M.D.   On: 07/17/2022 17:07   DG Abd 2 Views Result Date: 07/17/2022 CLINICAL DATA:  Swallowed dental crown EXAM: ABDOMEN - 2 VIEW COMPARISON:  None Available. FINDINGS: Scattered colonic stool. Gas is seen in nondilated loops of small and large bowel. There is a metallic focus overlying the distal stomach consistent with the patient's history of swallowed radiopaque foreign body. The extreme caudal aspect of the pelvis is clipped off the edge of the film. IMPRESSION: Radiopaque foreign body overlying the distal stomach in the same which could be consistent with a dental crown. Nonspecific bowel gas pattern with scattered stool. Electronically Signed   By: Ranell Bring M.D.   On: 07/17/2022 17:05    Assessment & Plan:   Problem List Items Addressed This Visit     BPH (benign prostatic hyperplasia)   CVA, old, hemiparesis (HCC) - Primary   On Baclofen  10 mg tid      Relevant Orders   Comprehensive metabolic panel with GFR   CBC with Differential/Platelet   Hemoglobin A1c   Microalbumin / creatinine urine ratio   Lipid panel   TSH   PSA   Hypertension associated with diabetes (HCC)   Relevant Orders   Comprehensive metabolic panel with GFR   CBC with Differential/Platelet   Hemoglobin A1c   Microalbumin / creatinine urine ratio   Lipid panel   TSH   PSA   Microalbuminuria due to type 2 diabetes mellitus (HCC)   We can add  another medication like Farxiga or Kerendia to help to protect the kidneys.       Relevant Orders   Hemoglobin A1c   Microalbumin / creatinine urine ratio   Type 2 diabetes mellitus with diabetic polyneuropathy, without long-term current use of insulin  (HCC)   Relevant Orders   Comprehensive metabolic panel with GFR   CBC with Differential/Platelet   Hemoglobin A1c   Microalbumin / creatinine urine ratio   Lipid panel   TSH   PSA  No orders of the defined types were placed in this encounter.     Follow-up: No follow-ups on file.  Marolyn Noel, MD

## 2024-02-24 NOTE — Assessment & Plan Note (Signed)
We can add another medication like [place] or Chauncey Mann to help to protect the kidneys.

## 2024-02-28 ENCOUNTER — Ambulatory Visit: Payer: Self-pay | Admitting: Internal Medicine

## 2024-04-28 ENCOUNTER — Encounter: Payer: Self-pay | Admitting: *Deleted

## 2024-04-28 NOTE — Progress Notes (Signed)
 Male Minish                                          MRN: [id number]   04/28/2024   The [facility] Quality Team Specialist reviewed this patient medical record for the purposes of chart review for care gap closure. The following were reviewed: abstraction for care gap closure-kidney health evaluation for diabetes:eGFR  and uACR.    [facility] Quality Team

## 2024-05-24 ENCOUNTER — Ambulatory Visit: Admitting: Internal Medicine

## 2024-06-10 ENCOUNTER — Ambulatory Visit

## 2025-02-27 ENCOUNTER — Ambulatory Visit: Admitting: Neurology
